# Patient Record
Sex: Female | Born: 1935 | Race: White | Hispanic: No | Smoking: Former smoker
Health system: Southern US, Community
[De-identification: ages and names within clinical notes are randomized; demographics above are authoritative.]

## PROBLEM LIST (undated history)

## (undated) DIAGNOSIS — I10 Essential (primary) hypertension: Secondary | ICD-10-CM

## (undated) DIAGNOSIS — E039 Hypothyroidism, unspecified: Secondary | ICD-10-CM

## (undated) DIAGNOSIS — J449 Chronic obstructive pulmonary disease, unspecified: Secondary | ICD-10-CM

## (undated) DIAGNOSIS — I482 Chronic atrial fibrillation, unspecified: Secondary | ICD-10-CM

## (undated) DIAGNOSIS — IMO0001 Reserved for inherently not codable concepts without codable children: Secondary | ICD-10-CM

## (undated) DIAGNOSIS — Z5189 Encounter for other specified aftercare: Secondary | ICD-10-CM

## (undated) DIAGNOSIS — H3530 Unspecified macular degeneration: Secondary | ICD-10-CM

## (undated) DIAGNOSIS — M069 Rheumatoid arthritis, unspecified: Secondary | ICD-10-CM

## (undated) DIAGNOSIS — G629 Polyneuropathy, unspecified: Secondary | ICD-10-CM

## (undated) DIAGNOSIS — J180 Bronchopneumonia, unspecified organism: Secondary | ICD-10-CM

## (undated) DIAGNOSIS — D649 Anemia, unspecified: Secondary | ICD-10-CM

## (undated) DIAGNOSIS — I739 Peripheral vascular disease, unspecified: Secondary | ICD-10-CM

## (undated) DIAGNOSIS — N898 Other specified noninflammatory disorders of vagina: Secondary | ICD-10-CM

## (undated) DIAGNOSIS — Z87828 Personal history of other (healed) physical injury and trauma: Secondary | ICD-10-CM

## (undated) DIAGNOSIS — J984 Other disorders of lung: Secondary | ICD-10-CM

## (undated) DIAGNOSIS — E7800 Pure hypercholesterolemia, unspecified: Secondary | ICD-10-CM

## (undated) DIAGNOSIS — J439 Emphysema, unspecified: Secondary | ICD-10-CM

## (undated) DIAGNOSIS — I509 Heart failure, unspecified: Secondary | ICD-10-CM

## (undated) HISTORY — PX: CERVICAL DISC SURGERY: SHX588

## (undated) HISTORY — PX: EYE SURGERY: SHX253

## (undated) HISTORY — DX: Hypothyroidism, unspecified: E03.9

## (undated) HISTORY — PX: CARDIOVERSION: SHX1299

## (undated) HISTORY — DX: Polyneuropathy, unspecified: G62.9

## (undated) HISTORY — PX: JOINT REPLACEMENT: SHX530

## (undated) HISTORY — PX: APPENDECTOMY: SHX54

## (undated) HISTORY — DX: Other disorders of lung: J98.4

## (undated) HISTORY — DX: Chronic atrial fibrillation, unspecified: I48.20

## (undated) HISTORY — PX: DILATION AND CURETTAGE OF UTERUS: SHX78

## (undated) HISTORY — PX: BACK SURGERY: SHX140

## (undated) HISTORY — PX: TOTAL HIP ARTHROPLASTY: SHX124

## (undated) HISTORY — DX: Pure hypercholesterolemia, unspecified: E78.00

## (undated) HISTORY — DX: Anemia, unspecified: D64.9

## (undated) HISTORY — PX: CATARACT EXTRACTION W/ INTRAOCULAR LENS  IMPLANT, BILATERAL: SHX1307

## (undated) HISTORY — DX: Essential (primary) hypertension: I10

---
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
## 1973-08-14 HISTORY — PX: TOTAL ABDOMINAL HYSTERECTOMY: SHX209

## 1998-04-27 ENCOUNTER — Ambulatory Visit (HOSPITAL_COMMUNITY): Admission: RE | Admit: 1998-04-27 | Discharge: 1998-04-27 | Payer: Self-pay | Admitting: Internal Medicine

## 2000-12-05 ENCOUNTER — Encounter: Admission: RE | Admit: 2000-12-05 | Discharge: 2001-03-05 | Payer: Self-pay | Admitting: Internal Medicine

## 2002-12-17 ENCOUNTER — Encounter: Payer: Self-pay | Admitting: Orthopedic Surgery

## 2002-12-17 ENCOUNTER — Encounter: Admission: RE | Admit: 2002-12-17 | Discharge: 2002-12-17 | Payer: Self-pay | Admitting: Orthopedic Surgery

## 2002-12-18 ENCOUNTER — Ambulatory Visit (HOSPITAL_BASED_OUTPATIENT_CLINIC_OR_DEPARTMENT_OTHER): Admission: RE | Admit: 2002-12-18 | Discharge: 2002-12-18 | Payer: Self-pay | Admitting: Orthopedic Surgery

## 2004-07-01 ENCOUNTER — Ambulatory Visit (HOSPITAL_COMMUNITY): Admission: RE | Admit: 2004-07-01 | Discharge: 2004-07-01 | Payer: Self-pay | Admitting: Cardiology

## 2004-07-19 ENCOUNTER — Ambulatory Visit (HOSPITAL_COMMUNITY): Admission: RE | Admit: 2004-07-19 | Discharge: 2004-07-19 | Payer: Self-pay | Admitting: Cardiology

## 2004-08-01 ENCOUNTER — Ambulatory Visit: Payer: Self-pay | Admitting: Critical Care Medicine

## 2004-09-14 ENCOUNTER — Ambulatory Visit: Payer: Self-pay | Admitting: Critical Care Medicine

## 2005-02-02 ENCOUNTER — Ambulatory Visit: Payer: Self-pay | Admitting: Critical Care Medicine

## 2005-05-15 ENCOUNTER — Ambulatory Visit: Payer: Self-pay | Admitting: Critical Care Medicine

## 2005-09-04 ENCOUNTER — Ambulatory Visit: Payer: Self-pay | Admitting: Critical Care Medicine

## 2005-11-01 ENCOUNTER — Ambulatory Visit: Payer: Self-pay | Admitting: Critical Care Medicine

## 2006-02-27 ENCOUNTER — Encounter: Admission: RE | Admit: 2006-02-27 | Discharge: 2006-03-25 | Payer: Self-pay | Admitting: Internal Medicine

## 2006-03-06 ENCOUNTER — Ambulatory Visit: Payer: Self-pay | Admitting: Critical Care Medicine

## 2006-03-26 ENCOUNTER — Encounter: Admission: RE | Admit: 2006-03-26 | Discharge: 2006-04-26 | Payer: Self-pay | Admitting: Internal Medicine

## 2006-07-13 ENCOUNTER — Ambulatory Visit: Payer: Self-pay | Admitting: Critical Care Medicine

## 2006-10-24 ENCOUNTER — Encounter: Admission: RE | Admit: 2006-10-24 | Discharge: 2006-10-24 | Payer: Self-pay | Admitting: Orthopedic Surgery

## 2006-11-19 ENCOUNTER — Encounter: Admission: RE | Admit: 2006-11-19 | Discharge: 2006-11-19 | Payer: Self-pay | Admitting: Orthopedic Surgery

## 2006-11-28 ENCOUNTER — Encounter: Admission: RE | Admit: 2006-11-28 | Discharge: 2007-01-10 | Payer: Self-pay | Admitting: Orthopedic Surgery

## 2006-12-26 ENCOUNTER — Ambulatory Visit: Payer: Self-pay | Admitting: Critical Care Medicine

## 2007-06-11 DIAGNOSIS — J4489 Other specified chronic obstructive pulmonary disease: Secondary | ICD-10-CM | POA: Insufficient documentation

## 2007-06-11 DIAGNOSIS — J449 Chronic obstructive pulmonary disease, unspecified: Secondary | ICD-10-CM

## 2007-06-11 DIAGNOSIS — E785 Hyperlipidemia, unspecified: Secondary | ICD-10-CM

## 2007-06-11 DIAGNOSIS — E119 Type 2 diabetes mellitus without complications: Secondary | ICD-10-CM | POA: Insufficient documentation

## 2007-06-11 DIAGNOSIS — I10 Essential (primary) hypertension: Secondary | ICD-10-CM | POA: Insufficient documentation

## 2007-06-11 DIAGNOSIS — I4891 Unspecified atrial fibrillation: Secondary | ICD-10-CM

## 2007-06-11 DIAGNOSIS — E039 Hypothyroidism, unspecified: Secondary | ICD-10-CM

## 2007-07-21 ENCOUNTER — Inpatient Hospital Stay (HOSPITAL_COMMUNITY): Admission: EM | Admit: 2007-07-21 | Discharge: 2007-07-29 | Payer: Self-pay | Admitting: Emergency Medicine

## 2007-07-21 ENCOUNTER — Ambulatory Visit: Payer: Self-pay | Admitting: Critical Care Medicine

## 2007-12-01 ENCOUNTER — Encounter: Admission: RE | Admit: 2007-12-01 | Discharge: 2007-12-01 | Payer: Self-pay | Admitting: Internal Medicine

## 2009-08-14 HISTORY — PX: FRACTURE SURGERY: SHX138

## 2010-03-22 ENCOUNTER — Ambulatory Visit: Payer: Self-pay | Admitting: Cardiology

## 2010-03-22 ENCOUNTER — Ambulatory Visit (HOSPITAL_COMMUNITY): Admission: RE | Admit: 2010-03-22 | Discharge: 2010-03-22 | Payer: Self-pay | Admitting: Cardiology

## 2010-06-10 ENCOUNTER — Inpatient Hospital Stay (HOSPITAL_COMMUNITY): Admission: EM | Admit: 2010-06-10 | Discharge: 2010-06-15 | Payer: Self-pay | Admitting: Emergency Medicine

## 2010-07-25 ENCOUNTER — Ambulatory Visit: Payer: Self-pay | Admitting: Cardiology

## 2010-10-25 LAB — BASIC METABOLIC PANEL
BUN: 18 mg/dL (ref 6–23)
BUN: 22 mg/dL (ref 6–23)
CO2: 26 mEq/L (ref 19–32)
CO2: 26 mEq/L (ref 19–32)
Calcium: 8 mg/dL — ABNORMAL LOW (ref 8.4–10.5)
Calcium: 8.3 mg/dL — ABNORMAL LOW (ref 8.4–10.5)
Chloride: 106 mEq/L (ref 96–112)
Chloride: 107 mEq/L (ref 96–112)
Creatinine, Ser: 0.93 mg/dL (ref 0.4–1.2)
Creatinine, Ser: 0.97 mg/dL (ref 0.4–1.2)
GFR calc Af Amer: >60 mL/min (ref >60)
GFR calc Af Amer: >60 mL/min (ref >60)
GFR calc non Af Amer: 54 mL/min — ABNORMAL LOW (ref >60)
GFR calc non Af Amer: 57 mL/min — ABNORMAL LOW (ref >60)
Glucose, Bld: 118 mg/dL — ABNORMAL HIGH (ref 70–99)
Glucose, Bld: 137 mg/dL — ABNORMAL HIGH (ref 70–99)
Potassium: 4.5 mEq/L (ref 3.5–5.1)
Potassium: 4.5 mEq/L (ref 3.5–5.1)
Sodium: 137 mEq/L (ref 135–145)
Sodium: 138 mEq/L (ref 135–145)

## 2010-10-25 LAB — PROTIME-INR
INR: 1.86 — ABNORMAL HIGH (ref 0.00–1.49)
INR: 2.09 — ABNORMAL HIGH (ref 0.00–1.49)
Prothrombin Time: 21.6 seconds — ABNORMAL HIGH (ref 11.6–15.2)
Prothrombin Time: 23.6 seconds — ABNORMAL HIGH (ref 11.6–15.2)

## 2010-10-25 LAB — CBC
HCT: 23.2 % — ABNORMAL LOW (ref 36.0–46.0)
HCT: 24.5 % — ABNORMAL LOW (ref 36.0–46.0)
Hemoglobin: 7.9 g/dL — ABNORMAL LOW (ref 12.0–15.0)
Hemoglobin: 8.3 g/dL — ABNORMAL LOW (ref 12.0–15.0)
MCH: 31.3 pg (ref 26.0–34.0)
MCH: 31.5 pg (ref 26.0–34.0)
MCHC: 33.8 g/dL (ref 30.0–36.0)
MCHC: 33.9 g/dL (ref 30.0–36.0)
MCV: 92.7 fL (ref 78.0–100.0)
MCV: 92.8 fL (ref 78.0–100.0)
Platelets: 145 10*3/uL — ABNORMAL LOW (ref 150–400)
Platelets: 196 10*3/uL (ref 150–400)
RBC: 2.51 MIL/uL — ABNORMAL LOW (ref 3.87–5.11)
RBC: 2.64 MIL/uL — ABNORMAL LOW (ref 3.87–5.11)
RDW: 14.3 % (ref 11.5–15.5)
RDW: 14.8 % (ref 11.5–15.5)
WBC: 4.9 10*3/uL (ref 4.0–10.5)
WBC: 5.1 10*3/uL (ref 4.0–10.5)

## 2010-10-25 LAB — GLUCOSE, CAPILLARY
Glucose-Capillary: 145 mg/dL — ABNORMAL HIGH (ref 70–99)
Glucose-Capillary: 148 mg/dL — ABNORMAL HIGH (ref 70–99)
Glucose-Capillary: 160 mg/dL — ABNORMAL HIGH (ref 70–99)
Glucose-Capillary: 173 mg/dL — ABNORMAL HIGH (ref 70–99)
Glucose-Capillary: 174 mg/dL — ABNORMAL HIGH (ref 70–99)
Glucose-Capillary: 250 mg/dL — ABNORMAL HIGH (ref 70–99)

## 2010-10-26 LAB — POCT I-STAT, CHEM 8
BUN: 26 mg/dL — ABNORMAL HIGH (ref 6–23)
Chloride: 100 mEq/L (ref 96–112)
Creatinine, Ser: 1.3 mg/dL — ABNORMAL HIGH (ref 0.4–1.2)
Glucose, Bld: 135 mg/dL — ABNORMAL HIGH (ref 70–99)
Potassium: 4.2 mEq/L (ref 3.5–5.1)
Sodium: 133 mEq/L — ABNORMAL LOW (ref 135–145)

## 2010-10-26 LAB — CBC
HCT: 36.9 % (ref 36.0–46.0)
Hemoglobin: 10.8 g/dL — ABNORMAL LOW (ref 12.0–15.0)
Hemoglobin: 12.4 g/dL (ref 12.0–15.0)
Hemoglobin: 9 g/dL — ABNORMAL LOW (ref 12.0–15.0)
MCH: 31.1 pg (ref 26.0–34.0)
MCH: 31.1 pg (ref 26.0–34.0)
MCH: 31.2 pg (ref 26.0–34.0)
MCH: 31.5 pg (ref 26.0–34.0)
MCHC: 33.7 g/dL (ref 30.0–36.0)
MCHC: 33.9 g/dL (ref 30.0–36.0)
MCV: 91.7 fL (ref 78.0–100.0)
MCV: 92.2 fL (ref 78.0–100.0)
MCV: 92.3 fL (ref 78.0–100.0)
Platelets: 114 10*3/uL — ABNORMAL LOW (ref 150–400)
Platelets: 120 10*3/uL — ABNORMAL LOW (ref 150–400)
Platelets: 140 10*3/uL — ABNORMAL LOW (ref 150–400)
Platelets: 143 10*3/uL — ABNORMAL LOW (ref 150–400)
RBC: 2.84 MIL/uL — ABNORMAL LOW (ref 3.87–5.11)
RBC: 3.47 MIL/uL — ABNORMAL LOW (ref 3.87–5.11)
RBC: 4 MIL/uL (ref 3.87–5.11)
RDW: 14.4 % (ref 11.5–15.5)
RDW: 15.1 % (ref 11.5–15.5)
WBC: 5.8 10*3/uL (ref 4.0–10.5)
WBC: 6.9 10*3/uL (ref 4.0–10.5)
WBC: 8.5 10*3/uL (ref 4.0–10.5)

## 2010-10-26 LAB — GLUCOSE, CAPILLARY
Glucose-Capillary: 109 mg/dL — ABNORMAL HIGH (ref 70–99)
Glucose-Capillary: 127 mg/dL — ABNORMAL HIGH (ref 70–99)
Glucose-Capillary: 141 mg/dL — ABNORMAL HIGH (ref 70–99)
Glucose-Capillary: 151 mg/dL — ABNORMAL HIGH (ref 70–99)
Glucose-Capillary: 210 mg/dL — ABNORMAL HIGH (ref 70–99)

## 2010-10-26 LAB — COMPREHENSIVE METABOLIC PANEL
BUN: 28 mg/dL — ABNORMAL HIGH (ref 6–23)
CO2: 29 mEq/L (ref 19–32)
Calcium: 8.3 mg/dL — ABNORMAL LOW (ref 8.4–10.5)
Chloride: 98 mEq/L (ref 96–112)
Creatinine, Ser: 1.34 mg/dL — ABNORMAL HIGH (ref 0.4–1.2)
GFR calc Af Amer: 45 mL/min — ABNORMAL LOW (ref >60)
GFR calc non Af Amer: 37 mL/min — ABNORMAL LOW (ref >60)
Total Bilirubin: 0.9 mg/dL (ref 0.3–1.2)

## 2010-10-26 LAB — BASIC METABOLIC PANEL
BUN: 26 mg/dL — ABNORMAL HIGH (ref 6–23)
CO2: 24 mEq/L (ref 19–32)
Calcium: 7.7 mg/dL — ABNORMAL LOW (ref 8.4–10.5)
Chloride: 109 mEq/L (ref 96–112)
Creatinine, Ser: 1.11 mg/dL (ref 0.4–1.2)
GFR calc Af Amer: 33 mL/min — ABNORMAL LOW (ref >60)
GFR calc non Af Amer: 27 mL/min — ABNORMAL LOW (ref >60)
Glucose, Bld: 188 mg/dL — ABNORMAL HIGH (ref 70–99)
Sodium: 135 mEq/L (ref 135–145)

## 2010-10-26 LAB — DIFFERENTIAL
Eosinophils Relative: 1 % (ref 0–5)
Lymphocytes Relative: 14 % (ref 12–46)
Lymphs Abs: 1 10*3/uL (ref 0.7–4.0)
Neutro Abs: 5.5 10*3/uL (ref 1.7–7.7)

## 2010-10-26 LAB — PROTIME-INR
INR: 1.35 (ref 0.00–1.49)
INR: 1.47 (ref 0.00–1.49)
INR: 1.52 — ABNORMAL HIGH (ref 0.00–1.49)
INR: 2.57 — ABNORMAL HIGH (ref 0.00–1.49)
Prothrombin Time: 16.9 seconds — ABNORMAL HIGH (ref 11.6–15.2)
Prothrombin Time: 18 seconds — ABNORMAL HIGH (ref 11.6–15.2)
Prothrombin Time: 27.7 seconds — ABNORMAL HIGH (ref 11.6–15.2)

## 2010-10-26 LAB — PREPARE FRESH FROZEN PLASMA: Unit division: 0

## 2010-10-26 LAB — URINALYSIS, ROUTINE W REFLEX MICROSCOPIC
Glucose, UA: NEGATIVE mg/dL
Ketones, ur: NEGATIVE mg/dL
Nitrite: NEGATIVE
Protein, ur: NEGATIVE mg/dL
Urobilinogen, UA: 0.2 mg/dL (ref 0.0–1.0)

## 2010-10-26 LAB — APTT: aPTT: 44 seconds — ABNORMAL HIGH (ref 24–37)

## 2010-10-26 LAB — TYPE AND SCREEN: ABO/RH(D): A POS

## 2010-10-26 LAB — ABO/RH: ABO/RH(D): A POS

## 2010-10-26 LAB — URINE CULTURE

## 2010-12-27 NOTE — H&P (Signed)
Sydney [name], RAMAKER                 ACCOUNT NO.:  [id number]   MEDICAL RECORD NO.:  [id number]          PATIENT TYPE:  EMS   LOCATION:  MAJO                         FACILITY:  [facility]   PHYSICIAN:  Mark A. Perini, M.D.   DATE OF BIRTH:  [birth year]   DATE OF ADMISSION:  07/21/2007  DATE OF DISCHARGE:                              HISTORY & PHYSICAL   PRIMARY CARE PHYSICIAN:  Dr. Geoffry Paradise.   PHYSICIANS:  Dr. Delfin Edis, cardiologist, and Dr. Danise Mina,  pulmonologist.   HISTORY OF PRESENT ILLNESS:  Sydney [name] is a very pleasant 77 year old female  with past history as listed below.  She has not fell well for several  days.  She felt as though she may have a urinary tract infection.  She  has felt tired and washed out.  Today she had to push herself to get  ready as she was preparing to go to a concert at 3 p.m.  Then sitting in  a chair she suddenly became very nauseated and vomited, and then had  another episode of vomiting while lying down.  She had no real cough  until after the emesis episodes.  After that time she had some rattling  in her chest.  There has been no definite fever.  No worsening cough  over the last few days, no blood from above or below.  She denies any  chest pain.  She has chronic shortness of breath which is not really  that different, although she was somewhat more short of breath around  the time of her nausea and vomiting episodes.  She has had some diarrhea  in the last 24-36 hours as well.   PAST MEDICAL HISTORY:  1. COPD not oxygen requiring.  2. Primary emphysema.  3. Type 2 diabetes.  4. Permanent atrial fibrillation.  5. Negative Cardiolite study in November 2005.  6. Cardioversion procedure November 2005, but she is apparently in      chronic atrial fibrillation at this point.  7. History of cholecystectomy, hysterectomy, oophorectomy,      appendectomy, and back surgeries.  8. She states her last hemoglobin A1c was in the 7 range.  9. She  denies any heart attack or stroke history.  She says that her      aorta may be somewhat enlarged.   ALLERGIES:  No known allergies.   MEDICATIONS:  1. Spiriva daily.  2. Amaryl unknown dose.  3. Calcium unknown dose.  4. Coumadin unknown dose.  5. Gabapentin 300 mg twice a day.  6. Lipitor which she has been off of recently.  7. Lisinopril 20 mg daily.  8. Multivitamin daily.  9. Synthroid unknown dose daily.  10.Vitamin B12.  11.Vitamin C.   SOCIAL HISTORY:  No tobacco, no drugs, no alcohol.  She lives alone.  She has been a widow for 26 years.  She has two children, one  grandchild.   FAMILY HISTORY:  Noncontributory.   REVIEW OF SYSTEMS:  As per the History of Present Illness.  In the  emergency room she was given Phenergan, Zithromax, Rocephin, and  Zofran.   PHYSICAL EXAMINATION:  VITAL SIGNS:  Temperature 96.7, blood pressure  97/55, pulse 98, respiratory 18, 94% saturation.  GENERAL:  She is age appropriate, supine, no acute distress.  There is  no pallor, no icterus.  She is alert and oriented x4, responds  appropriately, and follows commands normally.  NECK:  There is no JVD.  LUNGS:  There are decreased breath sounds bilaterally with some scant  bibasilar crackles noted.  No wheezes or rhonchi.  There are very  distant S1 and S2 noted on heart exam.  No definite murmur is  auscultated.  ABDOMEN:  Soft, nontender, nondistended with normoactive bowel sounds.  There are no masses.  EXTREMITIES:  There is no edema.  She moves extremities x4.   LABORATORY DATA:  Myoglobin 114, MB fraction 2, troponin I less than  0.05.  INR 1.9.  Sodium 137, potassium 3.9, chloride 97, CO2 23, BUN 21,  creatinine 1.34, glucose 190.  Calcium 9.  Albumin 4.1.  ALT 28.  Total  protein 6.7.  AST 30.  Lipase 21.  GFR 38 mL per minute.  White count  5.3 with 87% segs, 8% lymphocytes, 4% monocytes, hemoglobin 14.2,  platelet count 120,000.  Urinalysis is normal except for large   leukocytes and too numerous to count white cells.  On microscopic there  are 0-2 red cells and a few bacteria.   Chest x-rays personally reviewed and shows bibasilar infiltrates.   ASSESSMENT/PLAN:  An 77 year old female with pneumonia.  This is  possibly an aspiration event or possibly just a community-acquired  pneumonia.  She does have past history of chronic obstructive pulmonary  disease.  She may also have an underlying urinary tract infection and  may have had a viral syndrome given the recent diarrhea.  We will admit  to a telemetry bed.  We will check an EKG.  Will give her Zosyn  intravenously.  Will continue Coumadin per pharmacy dosing.  Her family  is to retrieve the doses of her medicines from home as the medication  list has been misplaced.  We will check a TSH, and place on proton pump  inhibitor therapy.  Will add Xopenex nebulized and continue her Spiriva.  She is not overly bronchospastic and is breathing comfortably now and I  do not think she needs corticosteroids at this time, but she will need  to be monitored for this.  She is a full code status.      Mark A. Perini, M.D.  Electronically Signed     MAP/MEDQ  D:  07/21/2007  T:  07/22/2007  Job:  [id number]   cc:   Geoffry Paradise, M.D.

## 2010-12-27 NOTE — Consult Note (Signed)
NAMELEOMA, FOLDS NO.:  [id number]   MEDICAL RECORD NO.:  [id number]          PATIENT TYPE:  INP   LOCATION:  2031                         FACILITY:  [facility]   PHYSICIAN:  Charlcie Cradle. Delford Field, MD, FCCPDATE OF BIRTH:  19[birth year]-08-[birth year]   DATE OF CONSULTATION:  07/25/2007  DATE OF DISCHARGE:                                 CONSULTATION   PULMONARY CONSULTATION:   CHIEF COMPLAINT:  Aspiration.   HISTORY OF PRESENT ILLNESS:  This is a pleasant 77 year old female with  a previous history of primary COPD, emphysema on Spiriva without oxygen  requirement, type 2 diabetes, chronic atrial fibrillation on Coumadin,  previous cholecystectomy, hysterectomy, oophorectomy, appendectomy and  back surgeries.  No previous history of coronary artery disease or  stroke.  She had episode of acute emesis and unresponsiveness, and may  have had a urinary tract infection as well.  Became tired, washed.  Then, while she was trying to go to a social event on Sunday, July 21, 2007, she suddenly became very nauseated, had an episode of emesis  and passed out.  Some of the material went into the lungs, and she has  severe cough.  She was brought into the emergency room with rattling and  shortness of breath, but this seemed to clear.  Then she, on a delayed  basis, developed increased shortness of breath over the last 24 hours.  Chest x-ray shows bilateral patchy infiltrates compatible with  aspiration.  We are asked to assess.   PAST HISTORY:  Medical as noted.  Previous history of cardioversion  procedure in 2005, but now in persistent chronic atrial fibrillation.   MEDICATION ALLERGIES:  None.   MEDICATIONS:  1. Spiriva daily.  2. Amaryl.  3. Calcium.  4. Coumadin.  5. Gabapentin.  6. Lipitor.  7. Lipitor.  8. Lisinopril.  9. Multivitamins.  10.Synthroid.  11.Vitamin supplementation.   SOCIAL HISTORY:  Ex-smoker.  Lives alone.  Widowed for 26 years.   FAMILY HISTORY:   Noncontributory.   REVIEW OF SYSTEMS:  As above.   CURRENT MEDICATIONS:  1. Zithromax.  2. Rocephin.   PHYSICAL EXAMINATION:  VITAL SIGNS:  Temperature 99, blood pressure  117/66, pulse 90, saturation 94% on 4 liters, respirations 20.  CHEST:  Showed bilateral rales, a few expired wheezes.  CARDIAC:  Irregular rate and rhythm without S3.  Normal S1 and S2.  ABDOMEN:  Soft, nontender.  EXTREMITIES:  No edema, clubbing or venous disease.  SKIN:  Clear.  NEUROLOGIC:  Intact.   CHEST X-RAY:  The chest x-ray was reviewed and showed bilateral patchy  airspace disease, left greater than right.   LABORATORY DATA:  White count 5300, hemoglobin 14.2, sodium 135,  potassium 34, chloride 102, CO2 of 26, BUN 17, blood sugar 147.  Liver  functions unremarkable.  There is no micro data available.   IMPRESSION:  Probable aspiration pneumonia bilateral due to chemical  pneumonitis.  I am also concerned Spiriva may be aggravating urinary  tract infections with urinary retention.  Underlying chronic obstructive  pulmonary disease, not oxygen dependent.   RECOMMENDATIONS:  Continue oxygen therapy.  Add budesonide to  nebulizers.  Discontinue Spiriva.  Brief steroid pulse.  Discontinue ACE  inhibitor and begin Avapro.  Change to Zosyn.  Discontinue Rocephin,  Zithromax.  Add flutter valve.  Increase Mucinex.      Charlcie Cradle Delford Field, MD, [facility]  Electronically Signed     PEW/MEDQ  D:  07/25/2007  T:  07/25/2007  Job:  [id number]   cc:   Loraine Leriche A. Perini, M.D.  Geoffry Paradise, M.D.

## 2010-12-27 NOTE — Assessment & Plan Note (Signed)
[facility]                             PULMONARY OFFICE NOTE   NAME:LEWISYanitza, Shvartsman                        MRN:          [id number]  DATE:12/26/2006                            DOB:          12-25-1920    Ms. Timmons returns today in followup.  She is an 77 year old white female  with history of chronic obstructive lung disease, primary emphysema,  type 2 diabetes, chronic atrial fibrillation.  Still has occasional  wheezing and dyspnea.  The cough is dry.  She maintains Spiriva daily.  Other maintenance medicines are listed in the chart, and correct as  reviewed.   PHYSICAL EXAMINATION:  Temperature 97.  Blood pressure 108/80.  Pulse  86.  Saturation was 96% on room air.  CHEST:  Showed distant breath sounds with a few expiratory wheezing.  CARDIAC EXAM:  Showed a regular rate and rhythm without S3.  Normal S1  and S2.  ABDOMEN:  Soft and non-tender.  EXTREMITIES:  Showed no edema or clubbing.  SKIN:  Was clear.   Spirometry obtained today showed spirometry except for moderate  peripheral airflow obstruction with an FEV of 25/75 at 59% predicted.   IMPRESSION:  Chronic obstructive lung disease, but stable at this time.   PLAN:  Maintain Spiriva as is.  We will see the patient back in  followup.     Charlcie Cradle Delford Field, MD, [facility]  Electronically Signed    PEW/MedQ  DD: 12/26/2006  DT: 12/26/2006  Job #: [id number]   cc:   Geoffry Paradise, M.D.

## 2010-12-30 NOTE — Cardiovascular Report (Signed)
NAMEBRUNILDA, EBLE NO.:  [id number]   MEDICAL RECORD NO.:  [id number]          PATIENT TYPE:  OIB   LOCATION:  2899                         FACILITY:  [facility]   PHYSICIAN:  Colleen Can. Deborah Chalk, M.D.DATE OF BIRTH:  1920/09/13   DATE OF PROCEDURE:  07/01/2004  DATE OF DISCHARGE:                              CARDIAC CATHETERIZATION   PROCEDURE:  Cardioversion.   ANESTHESIA:  Dr. Kipp Brood, 125 mg Pentothal.   PROCEDURE:  Using anterior posterior paddles, 200 watt seconds of energy  were delivered in synchronized fashion with conversion to sinus rhythm.  She  had a prolonged first-degree AV block and occasional PACs, occasional PVCs,  and gradually stabilized into a sinus mechanism.  She may have some slowed  sinus node recovery times, but no long or dangerous pauses were noted.       SNT/MEDQ  D:  07/01/2004  T:  07/01/2004  Job:  [id number]

## 2010-12-30 NOTE — Assessment & Plan Note (Signed)
[facility]                             PULMONARY OFFICE NOTE   NAME:Sydney [name], Sydney [name]                        MRN:          [id number]  DATE:07/13/2006                            DOB:          03-10-1921    Sydney [name] is an 77 year old white female with history of primary  emphysema, chronic obstructive lung disease.  She is doing well on  Spiriva daily.  No change in her other medications have been made except  she is now on lisinopril 10/12.5 daily.   On exam, temperature 97, blood pressure 106/70, pulse 83, saturation 95%  room air.  Chest showed diminished breath sounds with prolonged  expiratory phase.  There was no evidence of wheeze or rhonchi noted.  Cardiac exam showed a regular rate and rhythm without S3.  Normal S1-S2.  Abdomen was soft, nontender.  Extremities showed no edema or clubbing.  Skin was clear.   IMPRESSION:  Chronic obstructive lung disease with primary emphysematous  component.   PLAN:  For the patient to maintain Spiriva as currently dosed and we  will see the patient back in return follow up in six months.     Charlcie Cradle Delford Field, MD, [facility]  Electronically Signed    PEW/MedQ  DD: 07/13/2006  DT: 07/14/2006  Job #: [id number]

## 2010-12-30 NOTE — Discharge Summary (Signed)
[name], [name]:  [id number]   MEDICAL RECORD NO.:  [id number]          PATIENT TYPE:  INP   LOCATION:  2031                         FACILITY:  [facility]   PHYSICIAN:  Geoffry Paradise, M.D.  DATE OF BIRTH:  [birth year]   DATE OF ADMISSION:  07/21/2007  DATE OF DISCHARGE:  07/29/2007                               DISCHARGE SUMMARY   DIAGNOSES AT THE TIME OF DISCHARGE.:  1. Aspiration pneumonia.  2. Diabetes mellitus, type 2.  3. Chronic obstructive pulmonary disease.  4. Atrial fibrillation on chronic Coumadin.   HISTORY OF PRESENT ILLNESS:  Sydney [name] is a pleasant 77 year old  with chronic atrial fibrillation, COPD, type 2 diabetes mellitus,  presenting at this time with, nausea, vomiting and increasing cough.  She had not been well for several days and had a recent urinary tract  infection.  On the day of presentation, she became nauseated and vomited  and had at least a second episode of that as well.  She does have  chronic dyspnea and has had no fever but does present with progressive  cough, shortness of breath and chest x-ray compatible with bibasilar  infiltrates and questionable aspiration.  For details, see the dictated  summary by Dr. Waynard Edwards on July 21, 2007.   DATA:  Chest x-ray: Bilateral airspace disease, suspect for pneumonia.   EKG:  Atrial fibrillation, rate 96.   CBC:  Hemoglobin 14.2, hematocrit 43, white blood count 5.3, platelet  count 120,000.  PT 21.9, INR 1.9.  Chemistries:  Sodium 137, potassium  3.9, chloride 97, CO2 23, BUN 21, creatinine 1.34, glucose 190 calcium  9.0. Liver function testing:  Total protein 6.7, albumin 4.1, AST 30,  ALT 20, alkaline phosphatase 63, bilirubin 1.1, magnesium 1.5,  phosphorous 3.8, lipase 21.  Brain type natriuretic peptide is mildly at  elevated 494.  TSH 1.129   HOSPITAL COURSE:  The patient was admitted, placed on Zosyn, as well as  bronchodilators to include Xopenex.  She was  treated with Zofran for  nausea and vomiting, but this promptly resolved, and she was provided  with IV fluids as well.  Coumadin was continued by pharmacy protocol.  Insulin was administered in the form of sliding scale, and ultimately  some Lantus as secondary to failure to improve, a burst of steroids were  added.  Pulmonary consultation was obtained in addition to the steroids.  She was converted from lisinopril to Avapro at the outside, and this was  worsening her cough.  She continued on Zosyn despite a brief conversion  to Avelox, Rocephin, Zithromax.  Ultimately, slowly improved. Steroids  were discontinued, and she was stable for discharge.  The patient was  discharged in much improved stable condition.  Lung fields were clear.  O2 saturation stable, p.o. intake excellent.   DISCHARGE MEDICATIONS:  1. Levothyroxine 50 mcg daily.  2. Glimepiride 4 mg 1/2 daily.  3. Glimepiride 4 mg once daily.  4. Lisinopril/hydrochlorothiazide 20/25 one daily.  5. Lipitor 20 mg daily.  6. Gabapentin 300 b.i.d.  7. Multivitamins.  8. Coumadin 2.5 mg one  daily,'  9. Symbicort 160/4.5 two inhalations b.i.d.  10.Mucinex b.i.d.  11.Ceftin 500 b.i.d. for five additional days.  12.Lantus insulin 15 units nightly.  13.Spiriva had been discontinued.   She is to check blood sugars b.i.d. in hopes of weaning her back to oral  regimen alone.  She is to follow up with medication management in Dr.  Lanell Matar office for Coumadin checking and pulmonary followup as well.           ______________________________  Geoffry Paradise, M.D.     RA/MEDQ  D:  10/11/2007  T:  10/11/2007  Job:  [id number]

## 2010-12-30 NOTE — H&P (Signed)
Sydney [name], Sydney [name]                 ACCOUNT NO.:  [id number]   MEDICAL RECORD NO.:  [id number]          PATIENT TYPE:  OIB   LOCATION:                               FACILITY:  [facility]   PHYSICIAN:  Colleen Can. Deborah Chalk, M.D.DATE OF BIRTH:  1920-08-22   DATE OF ADMISSION:  07/01/2004  DATE OF DISCHARGE:                                HISTORY & PHYSICAL   CHIEF COMPLAINT:  Fatigue, shortness of breath, and weakness.   HISTORY OF PRESENT ILLNESS:  Ms. Ratliff is an 77 year old widowed white  female who presented to our office for evaluation of shortness of breath.  She underwent adenosine Cardiolite study, which was basically unremarkable  at that time; however, as the Cardiolite was performed she was noted to be  in atrial fibrillation.  The exact duration was unknown but is felt to date  back to June of this year.  She has subsequently been placed on Coumadin  anticoagulation.  Her INRs have now been therapeutic for approximately one  month, and she presents for elective cardioversion.  Clinically she has been  more short of breath as well as a generalized feeling of weakness and  fatigue.  She has had no frank syncope and no chest pain.   PAST MEDICAL HISTORY:  1.  Diabetes, noninsulin-dependent.  2.  Hypertension x20 years plus.  3.  Hyperlipidemia.  4.  Hypothyroidism on replacement.  5.  Remote history of pneumonia.  6.  Remote history of tobacco use.  7.  Obesity.  8.  Neuropathy.  9.  Previous neck surgery.  10. Status post total abdominal hysterectomy.  11. Status post cholecystectomy.  12. Status post appendectomy.   ALLERGIES:  None.   CURRENT MEDICATIONS:  1.  Coumadin 5 x1, 2.5 x6.  2.  Lipitor 40 mg daily.  3.  Synthroid 50 mcg daily.  4.  Lisinopril/HCT 10/12.5 mg b.i.d.  5.  Amaryl 2 mg a day.  6.  Neurontin 300 mg b.i.d.  7.  Multivitamin daily.   FAMILY HISTORY:  Father died of a stroke and had a history of heart  problems.  Mother died at age 16 and had a  stroke, heart problems, as well  as a pacemaker in place.   SOCIAL HISTORY:  She has been a widow for 23 years.  She has two children.  She is a remote smoker.  She has rare use of alcohol.   REVIEW OF SYSTEMS:  As noted above and is otherwise unremarkable.   PHYSICAL EXAMINATION:  GENERAL:  She is a pleasant, elderly white female in  no acute distress.  VITAL SIGNS:  Her weight is 172 pounds, blood pressure 140/80 sitting and  standing, heart rate is 88 and irregular.  CHEST:  Lungs are clear.  CARDIAC:  Heart shows an irregular irregular rhythm.  ABDOMEN:  Soft, positive bowel sounds, and nontender, yet is obese.  EXTREMITIES:  Without edema.  NEUROLOGIC:  Intact.  There are no gross focal deficits.   Pertinent labs are pending.   OVERALL IMPRESSION:  1.  Atrial fibrillation.  2.  Hypertension.  3.  Diabetes.  4.  Hyperlipidemia.  5.  Hypothyroidism.  6.  Previous negative adenosine Cardiolite study.   PLAN:  Will proceed on with attempts at elective cardioversion.  The  procedure has been reviewed in full detail with both her and her daughter,  and she is willing to proceed on Friday, July 01, 2004.       ________________________________________  Sharlee Blew, N.P.  ___________________________________________  Colleen Can. Deborah Chalk, M.D.    LC/MEDQ  D:  06/22/2004  T:  06/22/2004  Job:  [id number]   cc:   Geoffry Paradise, M.D.  [address]  [place]  [place] [address]  Fax: [phone]

## 2010-12-30 NOTE — Assessment & Plan Note (Signed)
[facility]                               PULMONARY OFFICE NOTE   NAME:LEWISKaye, Luoma                        MRN:          [id number]  DATE:03/06/2006                            DOB:          December 05, 1920    Ms. Styles is an 77 year old white female with a history of chronic  obstructive lung disease, primary emphysema.  Patient has been doing well on  the Spiriva product at one-two sprays daily, one capsule.  She appears to  tolerate this better than Advair.  She has not had any bladder retention  since she has been back on the Spiriva.   EXAMINATION:  VITAL SIGNS:  Temperature 98.  Blood pressure 124/70.  Pulse  77.  Saturation 97% on room air.  CHEST: Diminished breath sounds with prolonged inspiratory phase, no wheeze  or rhonchi noted.  CARDIAC:  Regular rate and rhythm that is 3, normal S1, S2.  ABDOMEN:  Soft, non-tender.  EXTREMITIES:  No edema or clubbing.  SKIN:  Clear.  NEUROLOGIC:  Intact.  HEENT:  No jugular venous distention.  Oropharynx clear.  NECK:  Supple.   IMPRESSION:  Chronic obstructive lung disease with a primary emphysematous  component - stable on Spiriva.   PLAN:  To maintain Spiriva as is.  We will see the patient back in return  followup.                                   Charlcie Cradle Delford Field, MD, FCCP   PEW/MedQ  DD:  03/06/2006  DT:  03/06/2006  Job #:  [id number]   cc:   Geoffry Paradise, MD

## 2011-01-04 ENCOUNTER — Ambulatory Visit: Payer: Self-pay | Admitting: Cardiology

## 2011-01-18 ENCOUNTER — Encounter: Payer: Self-pay | Admitting: Cardiology

## 2011-01-20 ENCOUNTER — Encounter: Payer: Self-pay | Admitting: Cardiology

## 2011-01-20 ENCOUNTER — Ambulatory Visit (INDEPENDENT_AMBULATORY_CARE_PROVIDER_SITE_OTHER): Payer: Medicare Other | Admitting: Cardiology

## 2011-01-20 DIAGNOSIS — I4891 Unspecified atrial fibrillation: Secondary | ICD-10-CM

## 2011-01-20 DIAGNOSIS — I10 Essential (primary) hypertension: Secondary | ICD-10-CM

## 2011-01-22 ENCOUNTER — Encounter: Payer: Self-pay | Admitting: Cardiology

## 2011-01-22 NOTE — Progress Notes (Signed)
Subjective:   Sydney [name] comes in today for followup visit. Overall, she's doing reasonably well she has chronic atrial fibrillation is managed with Coumadin anticoagulation. She's not had any lightheadedness or dizziness. She sees Dr. Jacky Kindle for followup INR.  She has occasional shortness of breath and occasional palpitations but is doing reasonably well. She does have underlying bronchospastic lung disease and a history of chronic dyspnea. She has chronic hypertension, hypercholesterolemia, a history of hypothyroidism.  She fractured her left hip in October of 2011 and has had a total hip replacement and is gradually improving.  Current Outpatient Prescriptions  Medication Sig Dispense Refill  . Ascorbic Acid (VITAMIN C PO) Take by mouth daily.        . Cyanocobalamin (VITAMIN B12 PO) Take by mouth daily.        . ferrous fumarate-iron polysaccharide complex (TANDEM) 162-115.2 MG CAPS Take 1 capsule by mouth daily with breakfast.        . fish oil-omega-3 fatty acids 1000 MG capsule Take by mouth daily.        . furosemide (LASIX) 20 MG tablet Take 20 mg by mouth every other day.        . gabapentin (NEURONTIN) 300 MG capsule Take 300 mg by mouth daily.       Marland Kitchen levothyroxine (SYNTHROID, LEVOTHROID) 50 MCG tablet Take 50 mcg by mouth daily.        Marland Kitchen lisinopril-hydrochlorothiazide (PRINZIDE,ZESTORETIC) 20-25 MG per tablet Take 1 tablet by mouth daily.        . metFORMIN (GLUCOPHAGE) 500 MG tablet Take 500 mg by mouth 2 (two) times daily with a meal.        . multivitamin-iron-minerals-folic acid (CENTRUM) chewable tablet Chew 1 tablet by mouth daily.        Marland Kitchen tiotropium (SPIRIVA) 18 MCG inhalation capsule Place 18 mcg into inhaler and inhale daily.        Marland Kitchen warfarin (COUMADIN) 2.5 MG tablet Take 2.5 mg by mouth. As directed by the [facility]        Allergies  Allergen Reactions  . Tramadol   . Vicodin (Hydrocodone-Acetaminophen)     Patient Active Problem List  Diagnoses  .  HYPOTHYROIDISM  . DIABETES MELLITUS, TYPE II  . HYPERLIPIDEMIA  . HYPERTENSION  . ATRIAL FIBRILLATION  . COPD    History  Smoking status  . Never Smoker   Smokeless tobacco  . Never Used    History  Alcohol Use No    Family History  Problem Relation Age of Onset  . Heart disease Mother   . Stroke Mother   . Heart disease Father   . Stroke Father   . Heart attack Father   . Muscular dystrophy Brother     Review of Systems:   The patient denies any heat or cold intolerance.  No weight gain or weight loss.  The patient denies headaches or blurry vision.  There is no cough or sputum production.  The patient denies dizziness.  There is no hematuria or hematochezia.  The patient denies any muscle aches or arthritis.  The patient denies any rash.  The patient denies frequent falling or instability.  There is no history of depression or anxiety.  All other systems were reviewed and are negative.   Physical Exam:   Weight is 143. Blood pressure is 138 or 72. Heart rate 80.The head is normocephalic and atraumatic.  Pupils are equally round and reactive to light.  Sclerae nonicteric.  Conjunctiva is  clear.  Oropharynx is unremarkable.  There's adequate oral airway.  Neck is supple there are no masses.  Thyroid is not enlarged.  There is no lymphadenopathy.  Lungs are clear.  Chest is symmetric.  Heart shows a regular rate and rhythm.  S1 and S2 are normal.  There is no murmur click or gallop.  Abdomen is soft normal bowel sounds.  There is no organomegaly.  Genital and rectal deferred.  Extremities are without edema.  Peripheral pulses are adequate.  Neurologically intact.  Full range of motion.  The patient is not depressed.  Skin is warm and dry.  Assessment / Plan:

## 2011-01-22 NOTE — Assessment & Plan Note (Signed)
Blood pressure readings are satisfactory 

## 2011-01-22 NOTE — Assessment & Plan Note (Signed)
She is managed with rate control and warfarin anticoagulation. Overall, she's doing reasonably well.

## 2011-04-25 ENCOUNTER — Emergency Department (HOSPITAL_COMMUNITY)
Admission: EM | Admit: 2011-04-25 | Discharge: 2011-04-25 | Disposition: A | Discharge status: Home / Self Care | Payer: Medicare Other | Attending: Emergency Medicine | Admitting: Emergency Medicine

## 2011-04-25 DIAGNOSIS — J449 Chronic obstructive pulmonary disease, unspecified: Secondary | ICD-10-CM | POA: Insufficient documentation

## 2011-04-25 DIAGNOSIS — S60949A Unspecified superficial injury of unspecified finger, initial encounter: Secondary | ICD-10-CM | POA: Insufficient documentation

## 2011-04-25 DIAGNOSIS — E039 Hypothyroidism, unspecified: Secondary | ICD-10-CM | POA: Insufficient documentation

## 2011-04-25 DIAGNOSIS — M7989 Other specified soft tissue disorders: Secondary | ICD-10-CM | POA: Insufficient documentation

## 2011-04-25 DIAGNOSIS — J4489 Other specified chronic obstructive pulmonary disease: Secondary | ICD-10-CM | POA: Insufficient documentation

## 2011-04-25 DIAGNOSIS — I509 Heart failure, unspecified: Secondary | ICD-10-CM | POA: Insufficient documentation

## 2011-04-25 DIAGNOSIS — M79609 Pain in unspecified limb: Secondary | ICD-10-CM | POA: Insufficient documentation

## 2011-04-25 DIAGNOSIS — M1990 Unspecified osteoarthritis, unspecified site: Secondary | ICD-10-CM | POA: Insufficient documentation

## 2011-04-25 DIAGNOSIS — W4909XA Other specified item causing external constriction, initial encounter: Secondary | ICD-10-CM | POA: Insufficient documentation

## 2011-04-25 DIAGNOSIS — E119 Type 2 diabetes mellitus without complications: Secondary | ICD-10-CM | POA: Insufficient documentation

## 2011-05-02 ENCOUNTER — Other Ambulatory Visit: Payer: Self-pay | Admitting: Internal Medicine

## 2011-05-02 ENCOUNTER — Encounter (HOSPITAL_COMMUNITY): Payer: Medicare Other | Attending: Internal Medicine

## 2011-05-02 DIAGNOSIS — D649 Anemia, unspecified: Secondary | ICD-10-CM | POA: Insufficient documentation

## 2011-05-03 ENCOUNTER — Encounter (HOSPITAL_COMMUNITY)
Admission: RE | Admit: 2011-05-03 | Discharge: 2011-05-03 | Payer: Medicare Other | Source: Ambulatory Visit | Attending: Internal Medicine | Admitting: Internal Medicine

## 2011-05-03 ENCOUNTER — Other Ambulatory Visit: Payer: Self-pay | Admitting: Cardiology

## 2011-05-04 LAB — CROSSMATCH
ABO/RH(D): A POS
Antibody Screen: NEGATIVE
Unit division: 0

## 2011-05-04 NOTE — Telephone Encounter (Signed)
Refilled Meds from fax  

## 2011-05-19 LAB — CBC
MCV: 91.5
Platelets: 247
WBC: 7

## 2011-05-19 LAB — COMPREHENSIVE METABOLIC PANEL
AST: 17
Albumin: 2.5 — ABNORMAL LOW
Chloride: 104
Creatinine, Ser: 1.39 — ABNORMAL HIGH
GFR calc Af Amer: 44 — ABNORMAL LOW
Potassium: 3.6
Total Bilirubin: 0.9

## 2011-05-19 LAB — DIFFERENTIAL
Basophils Absolute: 0
Basophils Relative: 1
Eosinophils Relative: 1
Lymphocytes Relative: 22
Monocytes Absolute: 0.7

## 2011-05-22 LAB — CBC
HCT: 28 — ABNORMAL LOW
HCT: 30.2 — ABNORMAL LOW
HCT: 31.5 — ABNORMAL LOW
HCT: 32.4 — ABNORMAL LOW
HCT: 34.3 — ABNORMAL LOW
Hemoglobin: 11.6 — ABNORMAL LOW
Hemoglobin: 9.9 — ABNORMAL LOW
MCHC: 33.5
MCHC: 33.7
MCHC: 34.1
MCV: 89.9
MCV: 90.2
MCV: 90.8
MCV: 91.6
Platelets: 120 — ABNORMAL LOW
Platelets: 147 — ABNORMAL LOW
Platelets: 153
Platelets: 201
Platelets: 284
RBC: 3.22 — ABNORMAL LOW
RBC: 3.32 — ABNORMAL LOW
RBC: 3.76 — ABNORMAL LOW
RDW: 13.6
RDW: 13.7
RDW: 13.8
RDW: 14.1
WBC: 5
WBC: 5.3
WBC: 6.7

## 2011-05-22 LAB — COMPREHENSIVE METABOLIC PANEL
ALT: 17
ALT: 22
ALT: 25
ALT: 28
AST: 17
AST: 21
AST: 24
AST: 30
Albumin: 2.5 — ABNORMAL LOW
Albumin: 2.6 — ABNORMAL LOW
Albumin: 2.8 — ABNORMAL LOW
Albumin: 2.9 — ABNORMAL LOW
Albumin: 4.1
Alkaline Phosphatase: 42
Alkaline Phosphatase: 44
Alkaline Phosphatase: 56
Alkaline Phosphatase: 56
Alkaline Phosphatase: 63
BUN: 17
BUN: 24 — ABNORMAL HIGH
BUN: 25 — ABNORMAL HIGH
BUN: 29 — ABNORMAL HIGH
BUN: 37 — ABNORMAL HIGH
CO2: 22
CO2: 23
CO2: 25
CO2: 26
Calcium: 7.6 — ABNORMAL LOW
Calcium: 8.3 — ABNORMAL LOW
Calcium: 8.5
Chloride: 102
Chloride: 97
Chloride: 99
Creatinine, Ser: 1.06
Creatinine, Ser: 1.38 — ABNORMAL HIGH
Creatinine, Ser: 1.47 — ABNORMAL HIGH
Creatinine, Ser: 2.08 — ABNORMAL HIGH
GFR calc Af Amer: 44 — ABNORMAL LOW
GFR calc Af Amer: 46 — ABNORMAL LOW
GFR calc Af Amer: 47 — ABNORMAL LOW
GFR calc non Af Amer: 35 — ABNORMAL LOW
GFR calc non Af Amer: 36 — ABNORMAL LOW
GFR calc non Af Amer: 49 — ABNORMAL LOW
Glucose, Bld: 171 — ABNORMAL HIGH
Glucose, Bld: 233 — ABNORMAL HIGH
Potassium: 3.3 — ABNORMAL LOW
Potassium: 3.4 — ABNORMAL LOW
Potassium: 3.4 — ABNORMAL LOW
Potassium: 3.9
Potassium: 4.1
Potassium: 4.3
Sodium: 132 — ABNORMAL LOW
Sodium: 133 — ABNORMAL LOW
Sodium: 135
Sodium: 135
Sodium: 137
Total Bilirubin: 0.9
Total Bilirubin: 1
Total Bilirubin: 1.1
Total Protein: 5.3 — ABNORMAL LOW
Total Protein: 5.4 — ABNORMAL LOW
Total Protein: 5.4 — ABNORMAL LOW
Total Protein: 5.5 — ABNORMAL LOW
Total Protein: 6.1

## 2011-05-22 LAB — DIFFERENTIAL
Basophils Absolute: 0
Basophils Absolute: 0
Basophils Absolute: 0.1
Basophils Relative: 0
Basophils Relative: 0
Basophils Relative: 0
Basophils Relative: 1
Eosinophils Absolute: 0 — ABNORMAL LOW
Eosinophils Absolute: 0 — ABNORMAL LOW
Eosinophils Absolute: 0 — ABNORMAL LOW
Eosinophils Relative: 0
Eosinophils Relative: 0
Eosinophils Relative: 1
Lymphocytes Relative: 15
Lymphocytes Relative: 17
Lymphocytes Relative: 18
Lymphocytes Relative: 8 — ABNORMAL LOW
Lymphs Abs: 0.4 — ABNORMAL LOW
Lymphs Abs: 0.8
Lymphs Abs: 1.2
Lymphs Abs: 1.4
Monocytes Absolute: 0.1
Monocytes Absolute: 0.2
Monocytes Absolute: 0.4
Monocytes Absolute: 0.5
Monocytes Absolute: 0.5
Monocytes Absolute: 0.7
Monocytes Relative: 2 — ABNORMAL LOW
Monocytes Relative: 4
Monocytes Relative: 5
Monocytes Relative: 5
Monocytes Relative: 6
Monocytes Relative: 8
Neutro Abs: 13.9 — ABNORMAL HIGH
Neutro Abs: 4.3
Neutro Abs: 4.6
Neutro Abs: 4.9
Neutro Abs: 5
Neutro Abs: 9.2 — ABNORMAL HIGH
Neutrophils Relative %: 82 — ABNORMAL HIGH
Neutrophils Relative %: 93 — ABNORMAL HIGH

## 2011-05-22 LAB — URINALYSIS, ROUTINE W REFLEX MICROSCOPIC
Glucose, UA: NEGATIVE
Hgb urine dipstick: NEGATIVE
Protein, ur: NEGATIVE
Specific Gravity, Urine: 1.021
pH: 5.5

## 2011-05-22 LAB — URINE MICROSCOPIC-ADD ON

## 2011-05-22 LAB — PROTIME-INR
INR: 1.9 — ABNORMAL HIGH
INR: 2 — ABNORMAL HIGH
INR: 2.3 — ABNORMAL HIGH
INR: 2.4 — ABNORMAL HIGH
INR: >10.8
Prothrombin Time: 23.4 — ABNORMAL HIGH
Prothrombin Time: 26 — ABNORMAL HIGH
Prothrombin Time: 27 — ABNORMAL HIGH
Prothrombin Time: >90 — ABNORMAL HIGH

## 2011-05-22 LAB — B-NATRIURETIC PEPTIDE (CONVERTED LAB): Pro B Natriuretic peptide (BNP): 494 — ABNORMAL HIGH

## 2011-05-22 LAB — POCT CARDIAC MARKERS
CKMB, poc: 2
Troponin i, poc: <0.05

## 2011-05-22 LAB — TSH: TSH: 1.129

## 2011-05-22 LAB — MAGNESIUM: Magnesium: 1.5

## 2011-07-31 ENCOUNTER — Encounter: Payer: Self-pay | Admitting: Cardiology

## 2011-07-31 ENCOUNTER — Ambulatory Visit (INDEPENDENT_AMBULATORY_CARE_PROVIDER_SITE_OTHER): Payer: Medicare Other | Admitting: Cardiology

## 2011-07-31 VITALS — BP 121/61 | HR 90 | Wt 136.0 lb

## 2011-07-31 DIAGNOSIS — I10 Essential (primary) hypertension: Secondary | ICD-10-CM

## 2011-07-31 DIAGNOSIS — E785 Hyperlipidemia, unspecified: Secondary | ICD-10-CM

## 2011-07-31 DIAGNOSIS — I4891 Unspecified atrial fibrillation: Secondary | ICD-10-CM

## 2011-07-31 NOTE — Patient Instructions (Signed)
Your physician wants you to follow-up in: 6 MONTHS You will receive a reminder letter in the mail two months in advance. If you don't receive a letter, please call our office to schedule the follow-up appointment. 

## 2011-07-31 NOTE — Progress Notes (Signed)
HPI: 77 year old female previously followed by Dr. Deborah Chalk for followup of atrial fibrillation. Patient has permanent atrial fibrillation. Echocardiogram in August of 2011 showed normal LV function, mild left ventricular hypertrophy, mild left atrial enlargement, mild aortic and tricuspid regurgitation. Holter monitor in February of 2010 showed A. fib with controlled ventricular response. Myoview in June of 2008 showed normal LV function with an ejection fraction of 63% and normal perfusion. Patient last seen in June of 2012. Since then, the patient denies any dyspnea on exertion, orthopnea, PND, pedal edema, palpitations, syncope or chest pain.   Current Outpatient Prescriptions  Medication Sig Dispense Refill  . Ascorbic Acid (VITAMIN C PO) Take by mouth daily.        . Cholecalciferol (VITAMIN D3) 400 UNITS CAPS Take 1 tablet by mouth daily.        . Cyanocobalamin (VITAMIN B12 PO) Take by mouth daily.        . fish oil-omega-3 fatty acids 1000 MG capsule Take by mouth daily.        Marland Kitchen FOLIC ACID PO Take 1 tablet by mouth daily.        . furosemide (LASIX) 20 MG tablet        . gabapentin (NEURONTIN) 300 MG capsule Take 300 mg by mouth daily.       Marland Kitchen glimepiride (AMARYL) 4 MG tablet Take 4 mg by mouth daily before breakfast.        . levothyroxine (SYNTHROID, LEVOTHROID) 50 MCG tablet Take 50 mcg by mouth daily.        Marland Kitchen lisinopril-hydrochlorothiazide (PRINZIDE,ZESTORETIC) 20-25 MG per tablet Take 1 tablet by mouth daily.        . metFORMIN (GLUCOPHAGE) 500 MG tablet Take 500 mg by mouth 2 (two) times daily with a meal.        . methotrexate (RHEUMATREX) 10 MG tablet as directed. Caution: Chemotherapy. Protect from light.       . multivitamin-iron-minerals-folic acid (CENTRUM) chewable tablet Chew 1 tablet by mouth daily.        . predniSONE (DELTASONE) 5 MG tablet 1 1/2 TAB PO QD       . tiotropium (SPIRIVA) 18 MCG inhalation capsule Place 18 mcg into inhaler and inhale daily.        Marland Kitchen  warfarin (COUMADIN) 2.5 MG tablet Take 2.5 mg by mouth. As directed by the [facility]      . DISCONTD: furosemide (LASIX) 20 MG tablet TAKE 1 TABLET EVERY MORNING  30 tablet  5     Past Medical History  Diagnosis Date  . Chronic atrial fibrillation   . Lung disease     BRONCHOSPASTIC  . Neuropathy   . Hypertension   . Hypercholesterolemia   . Hypothyroidism   . Diabetes mellitus     NON-INSULIN DEPENDENT  . Anemia     Past Surgical History  Procedure Date  . Total hip arthroplasty     LEFT HIP  . Appendectomy   . Back surgery     History   Social History  . Marital Status: Widowed    Spouse Name: N/A    Number of Children: N/A  . Years of Education: N/A   Occupational History  . Not on file.   Social History Main Topics  . Smoking status: Never Smoker   . Smokeless tobacco: Never Used  . Alcohol Use: No  . Drug Use: No  . Sexually Active: Not on file   Other Topics Concern  . Not  on file   Social History Narrative  . No narrative on file    ROS: no fevers or chills, productive cough, hemoptysis, dysphasia, odynophagia, melena, hematochezia, dysuria, hematuria, rash, seizure activity, orthopnea, PND, pedal edema, claudication. Remaining systems are negative.  Physical Exam: Well-developed well-nourished in no acute distress.  Skin is warm and dry.  HEENT is normal.  Neck is supple. No thyromegaly.  Chest is clear to auscultation with normal expansion.  Cardiovascular exam is irregular Abdominal exam nontender or distended. No masses palpated. Extremities show no edema. neuro grossly intact  ECG atrial fibrillation, cannot rule out prior septal infarct.

## 2011-07-31 NOTE — Assessment & Plan Note (Signed)
Blood pressure controlled with present medications. Will continue. 

## 2011-07-31 NOTE — Assessment & Plan Note (Signed)
Continue Coumadin with goal INR 2-3. Coumadin is followed by primary care. Her rate is controlled on no medications.

## 2011-07-31 NOTE — Assessment & Plan Note (Signed)
Management per primary care. 

## 2011-08-25 DIAGNOSIS — L600 Ingrowing nail: Secondary | ICD-10-CM | POA: Diagnosis not present

## 2011-08-25 DIAGNOSIS — Z7901 Long term (current) use of anticoagulants: Secondary | ICD-10-CM | POA: Diagnosis not present

## 2011-08-25 DIAGNOSIS — L97409 Non-pressure chronic ulcer of unspecified heel and midfoot with unspecified severity: Secondary | ICD-10-CM | POA: Diagnosis not present

## 2011-08-25 DIAGNOSIS — L97509 Non-pressure chronic ulcer of other part of unspecified foot with unspecified severity: Secondary | ICD-10-CM | POA: Diagnosis not present

## 2011-08-25 DIAGNOSIS — E119 Type 2 diabetes mellitus without complications: Secondary | ICD-10-CM | POA: Diagnosis not present

## 2011-08-28 ENCOUNTER — Telehealth: Payer: Self-pay | Admitting: Cardiology

## 2011-08-28 ENCOUNTER — Encounter (HOSPITAL_BASED_OUTPATIENT_CLINIC_OR_DEPARTMENT_OTHER): Payer: Medicare Other | Attending: Internal Medicine

## 2011-08-28 DIAGNOSIS — Z79899 Other long term (current) drug therapy: Secondary | ICD-10-CM | POA: Diagnosis not present

## 2011-08-28 DIAGNOSIS — E1169 Type 2 diabetes mellitus with other specified complication: Secondary | ICD-10-CM | POA: Insufficient documentation

## 2011-08-28 DIAGNOSIS — I4891 Unspecified atrial fibrillation: Secondary | ICD-10-CM | POA: Diagnosis not present

## 2011-08-28 DIAGNOSIS — L97509 Non-pressure chronic ulcer of other part of unspecified foot with unspecified severity: Secondary | ICD-10-CM | POA: Insufficient documentation

## 2011-08-28 DIAGNOSIS — Z7901 Long term (current) use of anticoagulants: Secondary | ICD-10-CM | POA: Insufficient documentation

## 2011-08-28 DIAGNOSIS — J449 Chronic obstructive pulmonary disease, unspecified: Secondary | ICD-10-CM | POA: Diagnosis not present

## 2011-08-28 DIAGNOSIS — J4489 Other specified chronic obstructive pulmonary disease: Secondary | ICD-10-CM | POA: Insufficient documentation

## 2011-08-28 DIAGNOSIS — T8189XA Other complications of procedures, not elsewhere classified, initial encounter: Secondary | ICD-10-CM | POA: Diagnosis not present

## 2011-08-28 NOTE — Telephone Encounter (Signed)
New Msg: Pt daughter calling to schedule lower arterial duplex due to ulcers on pt feet. Please return pt daughter call to discuss further.

## 2011-08-28 NOTE — Telephone Encounter (Signed)
I talked with pt's daughter. [facility] faxed an order 08/25/11 for a LEA  to be scheduled due to foot ulcer. I talked with Charlynn Court and checked Dr Ludwig Clarks box--cannot locate order. Pt's daughter will ask [facility] to refax order to [facility] tomorrow so LEA can be scheduled.

## 2011-08-29 DIAGNOSIS — H43819 Vitreous degeneration, unspecified eye: Secondary | ICD-10-CM | POA: Diagnosis not present

## 2011-08-29 DIAGNOSIS — H35379 Puckering of macula, unspecified eye: Secondary | ICD-10-CM | POA: Diagnosis not present

## 2011-08-29 DIAGNOSIS — H353190 Nonexudative age-related macular degeneration, unspecified eye, stage unspecified: Secondary | ICD-10-CM | POA: Diagnosis not present

## 2011-08-29 NOTE — Progress Notes (Signed)
[facility]  NAME:  Sydney [name], Sydney [name]:  [id number]  MEDICAL RECORD NO.:  [id number]      DATE OF BIRTH:  12/10/1920  PHYSICIAN:  Jonelle Sports. Mutasim Tuckey, M.D.  VISIT DATE:  08/28/2011                                  OFFICE VISIT   HISTORY:  This 78 year old white female with a number of complex medical issues is seen for ulcerations on the tip of the right hallux and another on the lateral aspect of the left 5th toe.  The first of which has been present approximately 2 weeks and the second of which was discovered only 4 days ago.  She was seen by the podiatrist and also by Dr. Jacky Kindle with the podiatrist treating her with limited debridement and Silvadene cream and Dr. Jacky Kindle having added doxycycline. Apparently x-rays were made and we are making an effort to obtain those films.  The patient is here now for our evaluation and advice with regarding ongoing therapy.  It is noted that the wounds are not painful, although she admittedly has rather severe generalized peripheral neuropathy based on diabetes and rheumatoid arthritis and they have not drained or been malodorous.  She has no significant pain.  PAST MEDICAL HISTORY:  OPERATIONS:  A cholecystectomy at age 75, hysterectomy in 1975, cervical spine surgery in the 1980s, and left hip fracture replacement in October 2011.  She has had other hospitalizations in association with her chronic lung disease and atrial fibrillation.  ALLERGIES:  She is said to be allergic to no medications.  CURRENT MEDICATIONS: 1. Lisinopril 20/25 one daily. 2. Levothyroxine 50 mcg daily. 3. Spiriva inhaler daily to b.i.d. 4. Gabapentin 300 mg daily. 5. Furosemide 20 mg every other day. 6. Metformin 1000 mg daily. 7. Glimepiride 4 mg daily. 8. Warfarin 2.5 mg every other day and 5 mg on the alternating days. 9. Methotrexate 0.4 mg weekly. 10.Folic acid 1 mg daily. 11.Prednisone 5 mg daily. 12.Vitamin  B12. 13.Vitamin C. 14.Vitamin D3. 15.Calcium. 16.Fish oil. 17.Eye health multiple vitamin all taken on a daily basis.  PERSONAL HISTORY:  The patient lives alone and apparently is able to meet most of her needs.  She does have a sister who lives here in town. She has been fully immunized.  FAMILY HISTORY:  Not available in detail, but apparently is positive for diabetes to include type 1 diabetes in a son, also positive for hypertension, peripheral vascular disease.  REVIEW OF SYSTEMS:  HEAD, EYES, EARS, NOSE, THROAT:  The patient has had cataract surgery.  Thinks her vision is remarkable for her age of 34 and I would agree.  Hearing is adequate.  PULMONARY:  The patient has a history of smoking in her teen years, but none for many many decades. Does have some degree of COPD for which she takes the Spiriva inhaler. She has had trouble with a cardiac arrhythmias, primarily atrial fibrillation and remains on warfarin for that.  GASTROINTESTINAL: History of cholecystectomy.  No current symptomatology or problems at the moment.  Does mention that she has had considerable weight loss over the past 15 months since her hip fracture without a definite basis being identified.  GENITOURINARY:  No known urinary tract problems.  No known renal insufficiency. HEMATOLOGIC:  No history  of leukemia or lymphoma.  Has been anemic since the time of her surgery.  ENDOCRINE:  The patient has compensated primary hypothyroidism.  MUSCULOSKELETAL:  Has some osteoarthritis and also has rheumatoid.  It was for the rheumatoid flare that she was placed on prednisone which has now been tapered back to 5 mg daily.  PHYSICAL EXAMINATION:  VITAL SIGNS:  Blood pressure is 138/75, pulse 90 and irregular, respirations 18, temperature 97.7.  Capillary blood glucose 277.  Her weight is 136, her height 5 feet 5 inches. GENERAL:  This is a pleasant elderly alert and cooperative white female in no immediate  distress. HEENT:  Her mucous membranes are moist.  Slightly pale. SKIN:  Good texture and turgor. NECK:  Her carotid pulses show no bruits. Her neck veins are flat. CHEST:  Grossly clear to auscultation with somewhat distant breath sounds. HEART:  Sounds are distant as well with irregular rhythm.  No definite murmur or gallop. ABDOMEN:  Protuberant, soft, without organomegaly or masses. EXTREMITIES: Some chronic venous change bilaterally, but no significant edema.  Pulses are not palpable below the knees bilaterally, but there is no imminent critical ischemia that I can appreciate.  On the tip of the right hallux is an open ulcer with some loose semi- necrotic skin which probes to the tip of the distal phalanx.  There is no surrounding erythema.  No drainage.  No odor.  On the lateral aspect of the left 5th toe is again a small open ulceration that does not probe the bone and which likewise appears clean without drainage or significant odor.  IMPRESSION:  Diabetic foot ulcers bilaterally, Wagner 3 on the right hallux and Wagner 2 on the left 5th toe.  Some immunocompromise by virtue of clinical conditions and medication.  DISPOSITION:  The ulcer on the right toe is debrided with removal of the semi-necrotic skin and also to obliterate some undermining.  The ulcer after completion measures 1.8 x 1.0 x 0.3 cm.  The ulcer on the left 5th toe requires no debridement.  Following this, both wounds were treated with application of Iodosorb gel and they are covered with protective dressings, then with a bulky forefoot dressing with Lamisil placed between the toes because of the tendency toward hallux valgus and significant overriding of the 2nd toe bilaterally.  She is placed in healing sandals.  Culture was made of her right hallux wound and she will remain on her current doxycycline until the results of that cultures known.  We will continue our efforts to get copies of her  x-ray.  We will see the patient again in 1 week.          ______________________________ Jonelle Sports. Cheryll Cockayne, M.D.     RES/MEDQ  D:  08/28/2011  T:  08/29/2011  Job:  [id number]

## 2011-08-31 ENCOUNTER — Other Ambulatory Visit: Payer: Self-pay | Admitting: *Deleted

## 2011-08-31 DIAGNOSIS — L97909 Non-pressure chronic ulcer of unspecified part of unspecified lower leg with unspecified severity: Secondary | ICD-10-CM

## 2011-09-01 ENCOUNTER — Encounter (INDEPENDENT_AMBULATORY_CARE_PROVIDER_SITE_OTHER): Payer: Medicare Other | Admitting: *Deleted

## 2011-09-01 ENCOUNTER — Encounter: Payer: Medicare Other | Admitting: *Deleted

## 2011-09-01 DIAGNOSIS — E119 Type 2 diabetes mellitus without complications: Secondary | ICD-10-CM | POA: Diagnosis not present

## 2011-09-01 DIAGNOSIS — I739 Peripheral vascular disease, unspecified: Secondary | ICD-10-CM

## 2011-09-01 DIAGNOSIS — L97909 Non-pressure chronic ulcer of unspecified part of unspecified lower leg with unspecified severity: Secondary | ICD-10-CM

## 2011-09-01 DIAGNOSIS — E039 Hypothyroidism, unspecified: Secondary | ICD-10-CM | POA: Diagnosis not present

## 2011-09-01 DIAGNOSIS — I509 Heart failure, unspecified: Secondary | ICD-10-CM | POA: Diagnosis not present

## 2011-09-01 DIAGNOSIS — J449 Chronic obstructive pulmonary disease, unspecified: Secondary | ICD-10-CM | POA: Diagnosis not present

## 2011-09-04 DIAGNOSIS — I4891 Unspecified atrial fibrillation: Secondary | ICD-10-CM | POA: Diagnosis not present

## 2011-09-04 DIAGNOSIS — L97509 Non-pressure chronic ulcer of other part of unspecified foot with unspecified severity: Secondary | ICD-10-CM | POA: Diagnosis not present

## 2011-09-04 DIAGNOSIS — E1169 Type 2 diabetes mellitus with other specified complication: Secondary | ICD-10-CM | POA: Diagnosis not present

## 2011-09-04 DIAGNOSIS — J449 Chronic obstructive pulmonary disease, unspecified: Secondary | ICD-10-CM | POA: Diagnosis not present

## 2011-09-11 DIAGNOSIS — I4891 Unspecified atrial fibrillation: Secondary | ICD-10-CM | POA: Diagnosis not present

## 2011-09-11 DIAGNOSIS — J449 Chronic obstructive pulmonary disease, unspecified: Secondary | ICD-10-CM | POA: Diagnosis not present

## 2011-09-11 DIAGNOSIS — E1169 Type 2 diabetes mellitus with other specified complication: Secondary | ICD-10-CM | POA: Diagnosis not present

## 2011-09-11 DIAGNOSIS — L97509 Non-pressure chronic ulcer of other part of unspecified foot with unspecified severity: Secondary | ICD-10-CM | POA: Diagnosis not present

## 2011-09-13 DIAGNOSIS — Z7901 Long term (current) use of anticoagulants: Secondary | ICD-10-CM | POA: Diagnosis not present

## 2011-09-13 DIAGNOSIS — I4891 Unspecified atrial fibrillation: Secondary | ICD-10-CM | POA: Diagnosis not present

## 2011-09-18 ENCOUNTER — Encounter (HOSPITAL_BASED_OUTPATIENT_CLINIC_OR_DEPARTMENT_OTHER): Payer: Medicare Other | Attending: Internal Medicine

## 2011-09-18 DIAGNOSIS — L97509 Non-pressure chronic ulcer of other part of unspecified foot with unspecified severity: Secondary | ICD-10-CM | POA: Insufficient documentation

## 2011-09-18 DIAGNOSIS — E1169 Type 2 diabetes mellitus with other specified complication: Secondary | ICD-10-CM | POA: Insufficient documentation

## 2011-09-21 DIAGNOSIS — Z7901 Long term (current) use of anticoagulants: Secondary | ICD-10-CM | POA: Diagnosis not present

## 2011-09-21 DIAGNOSIS — I4891 Unspecified atrial fibrillation: Secondary | ICD-10-CM | POA: Diagnosis not present

## 2011-09-25 ENCOUNTER — Encounter (HOSPITAL_BASED_OUTPATIENT_CLINIC_OR_DEPARTMENT_OTHER): Payer: Medicare Other

## 2011-09-27 DIAGNOSIS — Z79899 Other long term (current) drug therapy: Secondary | ICD-10-CM | POA: Diagnosis not present

## 2011-09-27 DIAGNOSIS — M069 Rheumatoid arthritis, unspecified: Secondary | ICD-10-CM | POA: Diagnosis not present

## 2011-09-28 DIAGNOSIS — I4891 Unspecified atrial fibrillation: Secondary | ICD-10-CM | POA: Diagnosis not present

## 2011-09-28 DIAGNOSIS — Z7901 Long term (current) use of anticoagulants: Secondary | ICD-10-CM | POA: Diagnosis not present

## 2011-10-02 DIAGNOSIS — E1169 Type 2 diabetes mellitus with other specified complication: Secondary | ICD-10-CM | POA: Diagnosis not present

## 2011-10-02 DIAGNOSIS — L97509 Non-pressure chronic ulcer of other part of unspecified foot with unspecified severity: Secondary | ICD-10-CM | POA: Diagnosis not present

## 2011-10-04 DIAGNOSIS — I4891 Unspecified atrial fibrillation: Secondary | ICD-10-CM | POA: Diagnosis not present

## 2011-10-04 DIAGNOSIS — Z7901 Long term (current) use of anticoagulants: Secondary | ICD-10-CM | POA: Diagnosis not present

## 2011-10-09 DIAGNOSIS — E1169 Type 2 diabetes mellitus with other specified complication: Secondary | ICD-10-CM | POA: Diagnosis not present

## 2011-10-09 DIAGNOSIS — L97509 Non-pressure chronic ulcer of other part of unspecified foot with unspecified severity: Secondary | ICD-10-CM | POA: Diagnosis not present

## 2011-10-16 ENCOUNTER — Encounter (HOSPITAL_BASED_OUTPATIENT_CLINIC_OR_DEPARTMENT_OTHER): Payer: Medicare Other | Attending: Internal Medicine

## 2011-10-16 ENCOUNTER — Ambulatory Visit (HOSPITAL_COMMUNITY)
Admission: RE | Admit: 2011-10-16 | Discharge: 2011-10-16 | Disposition: A | Discharge status: Home / Self Care | Payer: Medicare Other | Source: Ambulatory Visit | Attending: Internal Medicine | Admitting: Internal Medicine

## 2011-10-16 ENCOUNTER — Other Ambulatory Visit (HOSPITAL_BASED_OUTPATIENT_CLINIC_OR_DEPARTMENT_OTHER): Payer: Self-pay | Admitting: Internal Medicine

## 2011-10-16 DIAGNOSIS — L97509 Non-pressure chronic ulcer of other part of unspecified foot with unspecified severity: Secondary | ICD-10-CM | POA: Diagnosis not present

## 2011-10-16 DIAGNOSIS — M8448XA Pathological fracture, other site, initial encounter for fracture: Secondary | ICD-10-CM | POA: Diagnosis not present

## 2011-10-16 DIAGNOSIS — Y838 Other surgical procedures as the cause of abnormal reaction of the patient, or of later complication, without mention of misadventure at the time of the procedure: Secondary | ICD-10-CM | POA: Insufficient documentation

## 2011-10-16 DIAGNOSIS — Z9989 Dependence on other enabling machines and devices: Secondary | ICD-10-CM | POA: Diagnosis not present

## 2011-10-16 DIAGNOSIS — S91309A Unspecified open wound, unspecified foot, initial encounter: Secondary | ICD-10-CM | POA: Diagnosis not present

## 2011-10-16 DIAGNOSIS — E1169 Type 2 diabetes mellitus with other specified complication: Secondary | ICD-10-CM | POA: Diagnosis not present

## 2011-10-16 DIAGNOSIS — T8189XA Other complications of procedures, not elsewhere classified, initial encounter: Secondary | ICD-10-CM | POA: Insufficient documentation

## 2011-10-18 DIAGNOSIS — L97509 Non-pressure chronic ulcer of other part of unspecified foot with unspecified severity: Secondary | ICD-10-CM | POA: Diagnosis not present

## 2011-10-18 DIAGNOSIS — E1169 Type 2 diabetes mellitus with other specified complication: Secondary | ICD-10-CM | POA: Diagnosis not present

## 2011-10-19 DIAGNOSIS — E1169 Type 2 diabetes mellitus with other specified complication: Secondary | ICD-10-CM | POA: Diagnosis not present

## 2011-10-19 DIAGNOSIS — L97509 Non-pressure chronic ulcer of other part of unspecified foot with unspecified severity: Secondary | ICD-10-CM | POA: Diagnosis not present

## 2011-10-19 LAB — GLUCOSE, CAPILLARY
Glucose-Capillary: 147 mg/dL — ABNORMAL HIGH (ref 70–99)
Glucose-Capillary: 130 mg/dL — ABNORMAL HIGH (ref 70–99)
Glucose-Capillary: 183 mg/dL — ABNORMAL HIGH (ref 70–99)

## 2011-10-20 DIAGNOSIS — L97509 Non-pressure chronic ulcer of other part of unspecified foot with unspecified severity: Secondary | ICD-10-CM | POA: Diagnosis not present

## 2011-10-20 DIAGNOSIS — E1169 Type 2 diabetes mellitus with other specified complication: Secondary | ICD-10-CM | POA: Diagnosis not present

## 2011-10-20 LAB — GLUCOSE, CAPILLARY: Glucose-Capillary: 200 mg/dL — ABNORMAL HIGH (ref 70–99)

## 2011-10-23 DIAGNOSIS — L97509 Non-pressure chronic ulcer of other part of unspecified foot with unspecified severity: Secondary | ICD-10-CM | POA: Diagnosis not present

## 2011-10-23 DIAGNOSIS — E1169 Type 2 diabetes mellitus with other specified complication: Secondary | ICD-10-CM | POA: Diagnosis not present

## 2011-10-24 DIAGNOSIS — L97509 Non-pressure chronic ulcer of other part of unspecified foot with unspecified severity: Secondary | ICD-10-CM | POA: Diagnosis not present

## 2011-10-24 DIAGNOSIS — Z7901 Long term (current) use of anticoagulants: Secondary | ICD-10-CM | POA: Diagnosis not present

## 2011-10-24 DIAGNOSIS — E1169 Type 2 diabetes mellitus with other specified complication: Secondary | ICD-10-CM | POA: Diagnosis not present

## 2011-10-24 DIAGNOSIS — I4891 Unspecified atrial fibrillation: Secondary | ICD-10-CM | POA: Diagnosis not present

## 2011-10-24 LAB — GLUCOSE, CAPILLARY: Glucose-Capillary: 253 mg/dL — ABNORMAL HIGH (ref 70–99)

## 2011-10-25 DIAGNOSIS — L97509 Non-pressure chronic ulcer of other part of unspecified foot with unspecified severity: Secondary | ICD-10-CM | POA: Diagnosis not present

## 2011-10-25 DIAGNOSIS — E1169 Type 2 diabetes mellitus with other specified complication: Secondary | ICD-10-CM | POA: Diagnosis not present

## 2011-10-26 DIAGNOSIS — L97509 Non-pressure chronic ulcer of other part of unspecified foot with unspecified severity: Secondary | ICD-10-CM | POA: Diagnosis not present

## 2011-10-26 DIAGNOSIS — E1169 Type 2 diabetes mellitus with other specified complication: Secondary | ICD-10-CM | POA: Diagnosis not present

## 2011-10-26 LAB — GLUCOSE, CAPILLARY: Glucose-Capillary: 290 mg/dL — ABNORMAL HIGH (ref 70–99)

## 2011-10-27 DIAGNOSIS — L97509 Non-pressure chronic ulcer of other part of unspecified foot with unspecified severity: Secondary | ICD-10-CM | POA: Diagnosis not present

## 2011-10-27 DIAGNOSIS — E1169 Type 2 diabetes mellitus with other specified complication: Secondary | ICD-10-CM | POA: Diagnosis not present

## 2011-10-27 LAB — GLUCOSE, CAPILLARY: Glucose-Capillary: 226 mg/dL — ABNORMAL HIGH (ref 70–99)

## 2011-10-30 DIAGNOSIS — L97509 Non-pressure chronic ulcer of other part of unspecified foot with unspecified severity: Secondary | ICD-10-CM | POA: Diagnosis not present

## 2011-10-30 DIAGNOSIS — E1169 Type 2 diabetes mellitus with other specified complication: Secondary | ICD-10-CM | POA: Diagnosis not present

## 2011-10-31 DIAGNOSIS — E1169 Type 2 diabetes mellitus with other specified complication: Secondary | ICD-10-CM | POA: Diagnosis not present

## 2011-10-31 DIAGNOSIS — L97509 Non-pressure chronic ulcer of other part of unspecified foot with unspecified severity: Secondary | ICD-10-CM | POA: Diagnosis not present

## 2011-10-31 LAB — GLUCOSE, CAPILLARY: Glucose-Capillary: 165 mg/dL — ABNORMAL HIGH (ref 70–99)

## 2011-11-01 DIAGNOSIS — E1169 Type 2 diabetes mellitus with other specified complication: Secondary | ICD-10-CM | POA: Diagnosis not present

## 2011-11-01 DIAGNOSIS — L97509 Non-pressure chronic ulcer of other part of unspecified foot with unspecified severity: Secondary | ICD-10-CM | POA: Diagnosis not present

## 2011-11-02 DIAGNOSIS — E1159 Type 2 diabetes mellitus with other circulatory complications: Secondary | ICD-10-CM | POA: Diagnosis not present

## 2011-11-02 DIAGNOSIS — Z7901 Long term (current) use of anticoagulants: Secondary | ICD-10-CM | POA: Diagnosis not present

## 2011-11-02 DIAGNOSIS — M48062 Spinal stenosis, lumbar region with neurogenic claudication: Secondary | ICD-10-CM | POA: Diagnosis not present

## 2011-11-02 DIAGNOSIS — E1169 Type 2 diabetes mellitus with other specified complication: Secondary | ICD-10-CM | POA: Diagnosis not present

## 2011-11-02 DIAGNOSIS — L97409 Non-pressure chronic ulcer of unspecified heel and midfoot with unspecified severity: Secondary | ICD-10-CM | POA: Diagnosis not present

## 2011-11-02 DIAGNOSIS — I4891 Unspecified atrial fibrillation: Secondary | ICD-10-CM | POA: Diagnosis not present

## 2011-11-02 DIAGNOSIS — L97509 Non-pressure chronic ulcer of other part of unspecified foot with unspecified severity: Secondary | ICD-10-CM | POA: Diagnosis not present

## 2011-11-02 LAB — GLUCOSE, CAPILLARY: Glucose-Capillary: 181 mg/dL — ABNORMAL HIGH (ref 70–99)

## 2011-11-02 NOTE — Progress Notes (Signed)
[facility]  NAME:  Sydney [name], TRAGER NO.:  [id number]  MEDICAL RECORD NO.:  [id number]      DATE OF BIRTH:  16-Jan-1921  PHYSICIAN:  Wayland Denis, DO       VISIT DATE:  11/01/2011                                  OFFICE VISIT   [name] is an 78 year old female who is being treated for bilateral lower extremity ulcers, which include the fourth and fifth toes on the left and the right great toe.  She has been undergoing treatment with doxycycline and hyperbaric oxygen as well as local dressings; however, there is some question about her progress.  Dr. Cheryll Cockayne asked that I take a look at the patient today for second opinion.  She has significant amount of bone exposed on her right great toe with very poor circulation and maceration of the surrounding soft tissue.  Pulse is weak. Essentially, there is capillary refill, it is very minimal of the toe. There is some scabbing and an odor.  At this point, I recommend significant excision with likely amputation of the toe and we have referred her to Dr. Magnus Ivan for this.  I also spoke with the patient's family to prepare them of the possibility.  We will certainly do all that we can to salvage it, but at this point, I am very concerned that if we do not pursue amputation that this could lead to more proximal amputation.     Wayland Denis, DO     CS/MEDQ  D:  11/01/2011  T:  11/02/2011  Job:  [id number]

## 2011-11-03 DIAGNOSIS — L97509 Non-pressure chronic ulcer of other part of unspecified foot with unspecified severity: Secondary | ICD-10-CM | POA: Diagnosis not present

## 2011-11-03 DIAGNOSIS — E1169 Type 2 diabetes mellitus with other specified complication: Secondary | ICD-10-CM | POA: Diagnosis not present

## 2011-11-03 LAB — GLUCOSE, CAPILLARY
Glucose-Capillary: 150 mg/dL — ABNORMAL HIGH (ref 70–99)
Glucose-Capillary: 170 mg/dL — ABNORMAL HIGH (ref 70–99)
Glucose-Capillary: 199 mg/dL — ABNORMAL HIGH (ref 70–99)

## 2011-11-06 ENCOUNTER — Other Ambulatory Visit (HOSPITAL_COMMUNITY): Payer: Self-pay | Admitting: Orthopaedic Surgery

## 2011-11-06 ENCOUNTER — Encounter (HOSPITAL_COMMUNITY): Payer: Self-pay | Admitting: Pharmacy Technician

## 2011-11-06 DIAGNOSIS — L03039 Cellulitis of unspecified toe: Secondary | ICD-10-CM | POA: Diagnosis not present

## 2011-11-06 DIAGNOSIS — E1169 Type 2 diabetes mellitus with other specified complication: Secondary | ICD-10-CM | POA: Diagnosis not present

## 2011-11-06 DIAGNOSIS — M86679 Other chronic osteomyelitis, unspecified ankle and foot: Secondary | ICD-10-CM | POA: Diagnosis not present

## 2011-11-06 DIAGNOSIS — L97509 Non-pressure chronic ulcer of other part of unspecified foot with unspecified severity: Secondary | ICD-10-CM | POA: Diagnosis not present

## 2011-11-07 ENCOUNTER — Other Ambulatory Visit (HOSPITAL_COMMUNITY): Payer: Self-pay | Admitting: *Deleted

## 2011-11-07 ENCOUNTER — Encounter (HOSPITAL_COMMUNITY)
Admission: RE | Admit: 2011-11-07 | Discharge: 2011-11-07 | Disposition: A | Discharge status: Home / Self Care | Payer: Medicare Other | Source: Ambulatory Visit | Attending: Orthopaedic Surgery | Admitting: Orthopaedic Surgery

## 2011-11-07 ENCOUNTER — Encounter (HOSPITAL_COMMUNITY): Payer: Self-pay

## 2011-11-07 DIAGNOSIS — I96 Gangrene, not elsewhere classified: Secondary | ICD-10-CM | POA: Diagnosis not present

## 2011-11-07 DIAGNOSIS — E7800 Pure hypercholesterolemia, unspecified: Secondary | ICD-10-CM | POA: Diagnosis not present

## 2011-11-07 DIAGNOSIS — E039 Hypothyroidism, unspecified: Secondary | ICD-10-CM | POA: Diagnosis not present

## 2011-11-07 DIAGNOSIS — J449 Chronic obstructive pulmonary disease, unspecified: Secondary | ICD-10-CM | POA: Diagnosis not present

## 2011-11-07 DIAGNOSIS — I10 Essential (primary) hypertension: Secondary | ICD-10-CM | POA: Diagnosis not present

## 2011-11-07 DIAGNOSIS — I4891 Unspecified atrial fibrillation: Secondary | ICD-10-CM | POA: Diagnosis not present

## 2011-11-07 DIAGNOSIS — L97509 Non-pressure chronic ulcer of other part of unspecified foot with unspecified severity: Secondary | ICD-10-CM | POA: Diagnosis not present

## 2011-11-07 DIAGNOSIS — E1169 Type 2 diabetes mellitus with other specified complication: Secondary | ICD-10-CM | POA: Diagnosis not present

## 2011-11-07 DIAGNOSIS — Z7901 Long term (current) use of anticoagulants: Secondary | ICD-10-CM | POA: Diagnosis not present

## 2011-11-07 DIAGNOSIS — M9080 Osteopathy in diseases classified elsewhere, unspecified site: Secondary | ICD-10-CM | POA: Diagnosis not present

## 2011-11-07 DIAGNOSIS — E1159 Type 2 diabetes mellitus with other circulatory complications: Secondary | ICD-10-CM | POA: Diagnosis not present

## 2011-11-07 DIAGNOSIS — M86679 Other chronic osteomyelitis, unspecified ankle and foot: Secondary | ICD-10-CM | POA: Diagnosis not present

## 2011-11-07 DIAGNOSIS — Z01812 Encounter for preprocedural laboratory examination: Secondary | ICD-10-CM | POA: Diagnosis not present

## 2011-11-07 HISTORY — DX: Chronic obstructive pulmonary disease, unspecified: J44.9

## 2011-11-07 HISTORY — DX: Reserved for inherently not codable concepts without codable children: IMO0001

## 2011-11-07 HISTORY — DX: Encounter for other specified aftercare: Z51.89

## 2011-11-07 LAB — BASIC METABOLIC PANEL
BUN: 26 mg/dL — ABNORMAL HIGH (ref 6–23)
CO2: 28 mEq/L (ref 19–32)
Chloride: 97 mEq/L (ref 96–112)
GFR calc Af Amer: 60 mL/min — ABNORMAL LOW (ref >90)
Potassium: 4.6 mEq/L (ref 3.5–5.1)

## 2011-11-07 LAB — CBC
HCT: 29.7 % — ABNORMAL LOW (ref 36.0–46.0)
Hemoglobin: 10 g/dL — ABNORMAL LOW (ref 12.0–15.0)
WBC: 11.3 10*3/uL — ABNORMAL HIGH (ref 4.0–10.5)

## 2011-11-07 LAB — SURGICAL PCR SCREEN: Staphylococcus aureus: NEGATIVE

## 2011-11-07 LAB — GLUCOSE, CAPILLARY: Glucose-Capillary: 206 mg/dL — ABNORMAL HIGH (ref 70–99)

## 2011-11-07 NOTE — Pre-Procedure Instructions (Addendum)
22 Sydney [name]  11/07/2011   Your procedure is scheduled on:  Thursday, March 28th.  Report to [facility] at 9:00AM.  Call this number if you have problems the morning of surgery: [phone]   Remember:   Do not eat food:After Midnight.   May have clear liquids: up to 4 Hours before arrival. (5:00)  Clear liquids include soda, tea, black coffee, apple or grape juice, broth.   Take these medicines the morning of surgery with A SIP OF WATER: Levothyroxine (Synthyroid),  Gabapentin (Neurotin).  Use Spiriva Inhaler   Do not wear jewelry, make-up or nail polish.  Do not wear lotions, powders, or perfumes. You may wear deodorant.  Do not shave 48 hours prior to surgery.  Do not bring valuables to the hospital.  Contacts, dentures or bridgework may not be worn into surgery.  Leave suitcase in the car. After surgery it may be brought to your room.  For patients admitted to the hospital, checkout time is 11:00 AM the day of discharge.   Patients discharged the day of surgery will not be allowed to drive home.  Name and phone number of your driver: NA  Special Instructions: CHG Shower Use Special Wash: 1/2 bottle night before surgery and 1/2 bottle morning of surgery.   Please read over the following fact sheets that you were given: Pain Booklet, Coughing and Deep Breathing, MRSA Information and Surgical Site Infection Prevention

## 2011-11-07 NOTE — Progress Notes (Signed)
SHERRI AT DR. BLACKMANS OFFICE AWARE THAT PT DID NOT HAVE PT PTT TODAY AT PRE ADMISSION VISIT EVEN THOUGH SHE IS ON  COUMADIN ... SHERRI WILL SPEAK TO DR Magnus Ivan TOMORROW ... ABOUT THIS AND ABOUT THE CONSENT THAT THE PT DID NOT SIGN AT PAT VISIT.. SHE STATES DR Magnus Ivan IS NOT GOING TO STOP COUMADIN FOR THIS PROCEDURE.

## 2011-11-08 ENCOUNTER — Other Ambulatory Visit (HOSPITAL_COMMUNITY): Payer: Medicare Other

## 2011-11-08 DIAGNOSIS — L97509 Non-pressure chronic ulcer of other part of unspecified foot with unspecified severity: Secondary | ICD-10-CM | POA: Diagnosis not present

## 2011-11-08 DIAGNOSIS — E1169 Type 2 diabetes mellitus with other specified complication: Secondary | ICD-10-CM | POA: Diagnosis not present

## 2011-11-08 DIAGNOSIS — N950 Postmenopausal bleeding: Secondary | ICD-10-CM | POA: Diagnosis not present

## 2011-11-08 LAB — GLUCOSE, CAPILLARY
Glucose-Capillary: 214 mg/dL — ABNORMAL HIGH (ref 70–99)
Glucose-Capillary: 165 mg/dL — ABNORMAL HIGH (ref 70–99)

## 2011-11-08 MED ORDER — CEFAZOLIN SODIUM 1-5 GM-% IV SOLN
1.0000 g | INTRAVENOUS | Status: AC
  Administered 2011-11-09: 1 g via INTRAVENOUS
  Filled 2011-11-08: qty 50

## 2011-11-08 NOTE — Progress Notes (Signed)
Sherri called from Dr Eliberto Ivory office ... The consent verbage will stay the same....     Also pt does not need pt or ptt for the surgery per Sherri/Dr. Magnus Ivan

## 2011-11-09 ENCOUNTER — Ambulatory Visit (HOSPITAL_COMMUNITY): Payer: Medicare Other | Admitting: Anesthesiology

## 2011-11-09 ENCOUNTER — Encounter (HOSPITAL_COMMUNITY): Payer: Self-pay | Admitting: Surgery

## 2011-11-09 ENCOUNTER — Ambulatory Visit (HOSPITAL_COMMUNITY)
Admission: RE | Admit: 2011-11-09 | Discharge: 2011-11-09 | Disposition: A | Discharge status: Home / Self Care | Payer: Medicare Other | Source: Ambulatory Visit | Attending: Orthopaedic Surgery | Admitting: Orthopaedic Surgery

## 2011-11-09 ENCOUNTER — Encounter (HOSPITAL_COMMUNITY): Payer: Self-pay | Admitting: Anesthesiology

## 2011-11-09 ENCOUNTER — Encounter (HOSPITAL_COMMUNITY)
Admission: RE | Disposition: A | Discharge status: Home / Self Care | Payer: Self-pay | Source: Ambulatory Visit | Attending: Orthopaedic Surgery

## 2011-11-09 DIAGNOSIS — L03039 Cellulitis of unspecified toe: Secondary | ICD-10-CM | POA: Diagnosis not present

## 2011-11-09 DIAGNOSIS — M9080 Osteopathy in diseases classified elsewhere, unspecified site: Secondary | ICD-10-CM | POA: Insufficient documentation

## 2011-11-09 DIAGNOSIS — M86679 Other chronic osteomyelitis, unspecified ankle and foot: Secondary | ICD-10-CM

## 2011-11-09 DIAGNOSIS — I96 Gangrene, not elsewhere classified: Secondary | ICD-10-CM | POA: Diagnosis not present

## 2011-11-09 DIAGNOSIS — Z7901 Long term (current) use of anticoagulants: Secondary | ICD-10-CM | POA: Insufficient documentation

## 2011-11-09 DIAGNOSIS — E1169 Type 2 diabetes mellitus with other specified complication: Secondary | ICD-10-CM | POA: Insufficient documentation

## 2011-11-09 DIAGNOSIS — E039 Hypothyroidism, unspecified: Secondary | ICD-10-CM | POA: Insufficient documentation

## 2011-11-09 DIAGNOSIS — E1159 Type 2 diabetes mellitus with other circulatory complications: Secondary | ICD-10-CM | POA: Diagnosis not present

## 2011-11-09 DIAGNOSIS — J449 Chronic obstructive pulmonary disease, unspecified: Secondary | ICD-10-CM | POA: Diagnosis not present

## 2011-11-09 DIAGNOSIS — J189 Pneumonia, unspecified organism: Secondary | ICD-10-CM | POA: Diagnosis not present

## 2011-11-09 DIAGNOSIS — L02619 Cutaneous abscess of unspecified foot: Secondary | ICD-10-CM | POA: Diagnosis not present

## 2011-11-09 DIAGNOSIS — I10 Essential (primary) hypertension: Secondary | ICD-10-CM | POA: Diagnosis not present

## 2011-11-09 DIAGNOSIS — M869 Osteomyelitis, unspecified: Secondary | ICD-10-CM | POA: Diagnosis not present

## 2011-11-09 DIAGNOSIS — J4489 Other specified chronic obstructive pulmonary disease: Secondary | ICD-10-CM | POA: Insufficient documentation

## 2011-11-09 DIAGNOSIS — Z01812 Encounter for preprocedural laboratory examination: Secondary | ICD-10-CM | POA: Insufficient documentation

## 2011-11-09 DIAGNOSIS — I4891 Unspecified atrial fibrillation: Secondary | ICD-10-CM | POA: Insufficient documentation

## 2011-11-09 DIAGNOSIS — G8918 Other acute postprocedural pain: Secondary | ICD-10-CM | POA: Diagnosis not present

## 2011-11-09 DIAGNOSIS — E7800 Pure hypercholesterolemia, unspecified: Secondary | ICD-10-CM | POA: Insufficient documentation

## 2011-11-09 HISTORY — PX: AMPUTATION: SHX166

## 2011-11-09 LAB — GLUCOSE, CAPILLARY: Glucose-Capillary: 124 mg/dL — ABNORMAL HIGH (ref 70–99)

## 2011-11-09 SURGERY — AMPUTATION DIGIT
Anesthesia: Monitor Anesthesia Care | Laterality: Right | Wound class: Dirty or Infected

## 2011-11-09 MED ORDER — 0.9 % SODIUM CHLORIDE (POUR BTL) OPTIME
TOPICAL | Status: DC | PRN
  Administered 2011-11-09: 1000 mL

## 2011-11-09 MED ORDER — LACTATED RINGERS IV SOLN
INTRAVENOUS | Status: DC | PRN
  Administered 2011-11-09: 11:00:00 via INTRAVENOUS

## 2011-11-09 MED ORDER — MIDAZOLAM HCL 2 MG/2ML IJ SOLN: 1.0000 mg | INTRAMUSCULAR | Status: DC | PRN

## 2011-11-09 MED ORDER — FENTANYL CITRATE 0.05 MG/ML IJ SOLN: 25.0000 ug | INTRAMUSCULAR | Status: DC | PRN

## 2011-11-09 MED ORDER — FENTANYL CITRATE 0.05 MG/ML IJ SOLN
INTRAMUSCULAR | Status: AC
  Filled 2011-11-09: qty 2

## 2011-11-09 MED ORDER — PROPOFOL 10 MG/ML IV EMUL
INTRAVENOUS | Status: DC | PRN
  Administered 2011-11-09: 50 ug/kg/min via INTRAVENOUS

## 2011-11-09 MED ORDER — BUPIVACAINE HCL (PF) 0.5 % IJ SOLN
INTRAMUSCULAR | Status: DC | PRN
  Administered 2011-11-09: 35 mL

## 2011-11-09 MED ORDER — LACTATED RINGERS IV SOLN
INTRAVENOUS | Status: DC
  Administered 2011-11-09: 11:00:00 via INTRAVENOUS

## 2011-11-09 MED ORDER — FENTANYL CITRATE 0.05 MG/ML IJ SOLN
50.0000 ug | INTRAMUSCULAR | Status: DC | PRN
  Administered 2011-11-09: 50 ug via INTRAVENOUS

## 2011-11-09 MED ORDER — LACTATED RINGERS IV SOLN: INTRAVENOUS | Status: DC

## 2011-11-09 MED ORDER — BUPIVACAINE HCL (PF) 0.25 % IJ SOLN
INTRAMUSCULAR | Status: DC | PRN
  Administered 2011-11-09: 5 mL

## 2011-11-09 MED ORDER — HYDROCODONE-ACETAMINOPHEN 5-325 MG PO TABS: 1.0000 | ORAL_TABLET | Freq: Four times a day (QID) | ORAL | Status: AC | PRN

## 2011-11-09 SURGICAL SUPPLY — 53 items
BANDAGE GAUZE 4  KLING STR (GAUZE/BANDAGES/DRESSINGS) IMPLANT
BANDAGE GAUZE ELAST BULKY 4 IN (GAUZE/BANDAGES/DRESSINGS) ×1 IMPLANT
BLADE AVERAGE 25X9 (BLADE) IMPLANT
BLADE MINI RND TIP GREEN BEAV (BLADE) IMPLANT
BNDG CMPR 9X4 STRL LF SNTH (GAUZE/BANDAGES/DRESSINGS) ×1
BNDG COHESIVE 1X5 TAN STRL LF (GAUZE/BANDAGES/DRESSINGS) IMPLANT
BNDG COHESIVE 4X5 TAN STRL (GAUZE/BANDAGES/DRESSINGS) ×1 IMPLANT
BNDG COHESIVE 6X5 TAN STRL LF (GAUZE/BANDAGES/DRESSINGS) IMPLANT
BNDG ESMARK 4X9 LF (GAUZE/BANDAGES/DRESSINGS) ×2 IMPLANT
BNDG GAUZE STRTCH 6 (GAUZE/BANDAGES/DRESSINGS) IMPLANT
CLOTH BEACON ORANGE TIMEOUT ST (SAFETY) ×2 IMPLANT
CORDS BIPOLAR (ELECTRODE) ×2 IMPLANT
COVER SURGICAL LIGHT HANDLE (MISCELLANEOUS) ×2 IMPLANT
CUFF TOURNIQUET SINGLE 18IN (TOURNIQUET CUFF) IMPLANT
CUFF TOURNIQUET SINGLE 24IN (TOURNIQUET CUFF) IMPLANT
CUFF TOURNIQUET SINGLE 34IN LL (TOURNIQUET CUFF) IMPLANT
DRAPE U-SHAPE 47X51 STRL (DRAPES) ×2 IMPLANT
DURAPREP 26ML APPLICATOR (WOUND CARE) ×2 IMPLANT
ELECT REM PT RETURN 9FT ADLT (ELECTROSURGICAL) ×2
ELECTRODE REM PT RTRN 9FT ADLT (ELECTROSURGICAL) ×1 IMPLANT
GAUZE SPONGE 2X2 8PLY STRL LF (GAUZE/BANDAGES/DRESSINGS) IMPLANT
GAUZE XEROFORM 1X8 LF (GAUZE/BANDAGES/DRESSINGS) ×2 IMPLANT
GLOVE BIOGEL PI IND STRL 6.5 (GLOVE) IMPLANT
GLOVE BIOGEL PI IND STRL 7.5 (GLOVE) IMPLANT
GLOVE BIOGEL PI IND STRL 8 (GLOVE) ×1 IMPLANT
GLOVE BIOGEL PI INDICATOR 6.5 (GLOVE) ×2
GLOVE BIOGEL PI INDICATOR 7.5 (GLOVE) ×1
GLOVE BIOGEL PI INDICATOR 8 (GLOVE) ×1
GLOVE ORTHO TXT STRL SZ7.5 (GLOVE) ×2 IMPLANT
GLOVE SURG SS PI 7.5 STRL IVOR (GLOVE) ×1 IMPLANT
GOWN PREVENTION PLUS LG XLONG (DISPOSABLE) IMPLANT
GOWN PREVENTION PLUS XLARGE (GOWN DISPOSABLE) ×2 IMPLANT
GOWN STRL NON-REIN LRG LVL3 (GOWN DISPOSABLE) ×3 IMPLANT
KIT BASIN OR (CUSTOM PROCEDURE TRAY) ×2 IMPLANT
KIT ROOM TURNOVER OR (KITS) ×2 IMPLANT
MANIFOLD NEPTUNE II (INSTRUMENTS) IMPLANT
NEEDLE HYPO 25GX1X1/2 BEV (NEEDLE) ×2 IMPLANT
NS IRRIG 1000ML POUR BTL (IV SOLUTION) ×2 IMPLANT
PACK ORTHO EXTREMITY (CUSTOM PROCEDURE TRAY) ×2 IMPLANT
PAD ARMBOARD 7.5X6 YLW CONV (MISCELLANEOUS) ×4 IMPLANT
PAD CAST 4YDX4 CTTN HI CHSV (CAST SUPPLIES) IMPLANT
PADDING CAST COTTON 4X4 STRL (CAST SUPPLIES)
SPECIMEN JAR SMALL (MISCELLANEOUS) ×2 IMPLANT
SPONGE GAUZE 2X2 STER 10/PKG (GAUZE/BANDAGES/DRESSINGS)
SPONGE GAUZE 4X4 12PLY (GAUZE/BANDAGES/DRESSINGS) ×1 IMPLANT
SUCTION FRAZIER TIP 10 FR DISP (SUCTIONS) ×2 IMPLANT
SUT ETHILON 2 0 FS 18 (SUTURE) IMPLANT
SUT VIC AB 2-0 FS1 27 (SUTURE) IMPLANT
SYR CONTROL 10ML LL (SYRINGE) ×2 IMPLANT
TOWEL OR 17X24 6PK STRL BLUE (TOWEL DISPOSABLE) ×2 IMPLANT
TOWEL OR 17X26 10 PK STRL BLUE (TOWEL DISPOSABLE) ×2 IMPLANT
TUBE CONNECTING 12X1/4 (SUCTIONS) IMPLANT
WATER STERILE IRR 1000ML POUR (IV SOLUTION) ×2 IMPLANT

## 2011-11-09 NOTE — H&P (Signed)
Sydney [name] is an 78 y.o. female.   Chief Complaint:   Right great toe with exposed bone; foul smell; obvious osteo HPI:   78 yo female diabetic with a right great toe chronic wound with exposed bone and obvious osteo.  She and her family understand that, given the clinical findings, an amputation of the right great toe is warranted.  She understands fully the risks and benefits involved.  She is on coumadin, but we have discussed keeping her on this due to the low risk of bleeding complications with this type of surgery.  Past Medical History  Diagnosis Date  . Chronic atrial fibrillation   . Lung disease     BRONCHOSPASTIC  . Neuropathy   . Hypertension   . Hypercholesterolemia   . Hypothyroidism   . Diabetes mellitus     NON-INSULIN DEPENDENT  . Anemia   . COPD (chronic obstructive pulmonary disease)   . Shortness of breath   . Pneumonia   . Angina     ATRIAL FIBB  . Blood transfusion 2012 ?APRIL    Past Surgical History  Procedure Date  . Total hip arthroplasty     LEFT HIP  . Appendectomy   . Back surgery   . Eye surgery     OU  . Fracture surgery 2011    LEFT HIP FX /REPLACEMENT   . Joint replacement     HIP LEFT   . Cholecystectomy 1951  . Tubal ligation   . Dilation and curettage of uterus   . Abdominal hysterectomy 1975    Family History  Problem Relation Age of Onset  . Heart disease Mother   . Stroke Mother   . Heart disease Father   . Stroke Father   . Heart attack Father   . Muscular dystrophy Brother   . Anesthesia problems Neg Hx   . Hypotension Neg Hx   . Malignant hyperthermia Neg Hx   . Pseudochol deficiency Neg Hx    Social History:  reports that she has never smoked. She has never used smokeless tobacco. She reports that she does not drink alcohol or use illicit drugs.  Allergies:  Allergies  Allergen Reactions  . Tramadol Anxiety  . Vicodin (Hydrocodone-Acetaminophen) Anxiety    Medications Prior to Admission  Medication Dose  Route Frequency Provider Last Rate Last Dose  . ceFAZolin (ANCEF) IVPB 1 g/50 mL premix  1 g Intravenous 60 min Pre-Op Kathryne Hitch, MD      . lactated ringers infusion   Intravenous Continuous Remonia Richter, MD 50 mL/hr at 11/09/11 1041     Medications Prior to Admission  Medication Sig Dispense Refill  . Ascorbic Acid (VITAMIN C PO) Take by mouth daily.        . Cholecalciferol (VITAMIN D3) 400 UNITS CAPS Take 1 tablet by mouth daily.        . Cyanocobalamin (VITAMIN B12 PO) Take by mouth daily.        Marland Kitchen FOLIC ACID PO Take 1 tablet by mouth daily.        . furosemide (LASIX) 20 MG tablet Take 20 mg by mouth daily.       Marland Kitchen gabapentin (NEURONTIN) 300 MG capsule Take 300 mg by mouth 2 (two) times daily.       Marland Kitchen glimepiride (AMARYL) 4 MG tablet Take 2 mg by mouth daily before breakfast.       . levothyroxine (SYNTHROID, LEVOTHROID) 50 MCG tablet Take 50 mcg by mouth  daily.       . lisinopril-hydrochlorothiazide (PRINZIDE,ZESTORETIC) 20-25 MG per tablet Take 1 tablet by mouth daily.        . metFORMIN (GLUCOPHAGE) 500 MG tablet Take 500 mg by mouth 2 (two) times daily with a meal.        . tiotropium (SPIRIVA) 18 MCG inhalation capsule Place 18 mcg into inhaler and inhale daily.          Results for orders placed during the hospital encounter of 11/09/11 (from the past 48 hour(s))  GLUCOSE, CAPILLARY     Status: Abnormal   Collection Time   11/09/11  8:54 AM      Component Value Range Comment   Glucose-Capillary 133 (*) 70 - 99 (mg/dL)    No results found.  Review of Systems  All other systems reviewed and are negative.    Blood pressure 115/67, pulse 90, temperature 97.9 F (36.6 C), temperature source Oral, resp. rate 18, SpO2 100.00%. Physical Exam  Constitutional: She is oriented to person, place, and time. She appears well-developed and well-nourished.  HENT:  Head: Normocephalic.  Neck: Normal range of motion. Neck supple.  Cardiovascular: Normal rate and  regular rhythm.   Respiratory: Effort normal and breath sounds normal.  GI: Soft. Bowel sounds are normal.  Musculoskeletal:       Feet:  Neurological: She is alert and oriented to person, place, and time.  Skin: Skin is warm and dry.  Psychiatric: She has a normal mood and affect.     Assessment/Plan To the OR for an amputation of her right great toe due to chronic osteo.  Leif Loflin Y 11/09/2011, 11:08 AM

## 2011-11-09 NOTE — Op Note (Signed)
[name], Sydney [name]:  [id number]  MEDICAL RECORD NO.:  [id number]  LOCATION:  [facility]                         FACILITY:  [facility]  PHYSICIAN:  Vanita Panda. Magnus Ivan, M.D.DATE OF BIRTH:  1921/05/30  DATE OF PROCEDURE:  11/09/2011 DATE OF DISCHARGE:  11/09/2011                              OPERATIVE REPORT   PREOPERATIVE DIAGNOSIS:  Chronic osteomyelitis and chronic wound, right great toe.  POSTOPERATIVE DIAGNOSIS:  Chronic osteomyelitis and chronic wound, right great toe.  PROCEDURE:  Right great toe amputation.  SURGEON:  Vanita Panda. Magnus Ivan, MD  ANESTHESIA: 1. Regional right ankle block. 2. Right great toe digital block. 3. Mask ventilation IV sedation.  BLOOD LOSS:  Minimal.  COMPLICATIONS:  None.  INDICATIONS:  [name] is an 78 year old female with a chronic great toe wound with foul odor and exposed bone as brown consistent with osteomyelitis.  The family understands that she needs to have an amputation back to a level that would give her the best chance at healing and closure.  They do wish to proceed with an amputation.  PROCEDURE DESCRIPTION:  After informed consent was obtained, appropriate right great toe was marked.  Anesthesia obtained, regional ankle block. She was then brought to the operating room and placed supine on the operating table.  Her right foot was then prepped and draped from the toes to the mid shin with DuraPrep and sterile drapes including a sterile stockinette.  A time-out was called and she was identified as the correct patient, correct right great toe.  Mask ventilation IV sedation was obtained.  I then placed a supplemental digital block with 0.5% plain Sensorcaine.  I then placed a towel clip within the toe and used a #10 blade to take this off to the IP joint.  I then passed the toe off and backed up the bone with a rongeur to good bleeding bone.  I did use an Esmarch as a local tourniquet around the ankle,  but removed this quickly after removing the big toe.  Hemostasis was obtained.  I cleaned the wound copiously with normal saline solution and then closed the skin as a flap with interrupted 2-0 nylon suture.  Xeroform followed by well-padded sterile dressing was applied.  The patient was taken to the recovery room in stable condition.  All final counts were correct and there no complications noted.     Vanita Panda. Magnus Ivan, M.D.     CYB/MEDQ  D:  11/09/2011  T:  11/09/2011  Job:  [id number]

## 2011-11-09 NOTE — Anesthesia Preprocedure Evaluation (Addendum)
Anesthesia Evaluation  Patient identified by MRN, date of birth, ID band Patient awake    Reviewed: Allergy & Precautions, H&P , NPO status , Patient's Chart, lab work & pertinent test results  History of Anesthesia Complications Negative for: history of anesthetic complications  Airway Mallampati: II TM Distance: >3 FB Neck ROM: Full    Dental No notable dental hx. (+) Teeth Intact and Dental Advisory Given   Pulmonary COPD COPD inhaler, former smoker (remote smoking history) breath sounds clear to auscultation  Pulmonary exam normal       Cardiovascular hypertension, Pt. on medications + dysrhythmias (on coumadin) Atrial Fibrillation Rhythm:Irregular Rate:Normal  ECHO 2011:  Normal LVF, mild AI   Neuro/Psych  Neuromuscular disease (diabetic peripheral neuropathy) negative neurological ROS     GI/Hepatic negative GI ROS, Neg liver ROS,   Endo/Other  Diabetes mellitus- (glu 133), Type 2, Oral Hypoglycemic AgentsHypothyroidism (on replacement)   Renal/GU negative Renal ROS     Musculoskeletal   Abdominal   Peds  Hematology   Anesthesia Other Findings   Reproductive/Obstetrics                          Anesthesia Physical Anesthesia Plan  ASA: III  Anesthesia Plan: MAC and Regional   Post-op Pain Management:    Induction: Intravenous  Airway Management Planned: Simple Face Mask  Additional Equipment:   Intra-op Plan:   Post-operative Plan:   Informed Consent: I have reviewed the patients History and Physical, chart, labs and discussed the procedure including the risks, benefits and alternatives for the proposed anesthesia with the patient or authorized representative who has indicated his/her understanding and acceptance.   Dental advisory given  Plan Discussed with: CRNA and Surgeon  Anesthesia Plan Comments: (Plan routine monitors, ankle block with MAC)        Anesthesia  Quick Evaluation

## 2011-11-09 NOTE — Transfer of Care (Signed)
Immediate Anesthesia Transfer of Care Note  Patient: Sydney [name]  Procedure(s) Performed: Procedure(s) (LRB): AMPUTATION DIGIT (Right)  Patient Location: PACU  Anesthesia Type: MAC and Regional  Level of Consciousness: awake, alert , oriented and patient cooperative  Airway & Oxygen Therapy: Patient Spontanous Breathing  Post-op Assessment: Report given to PACU RN, Post -op Vital signs reviewed and stable and Patient moving all extremities X 4  Post vital signs: Reviewed and stable  Complications: No apparent anesthesia complications

## 2011-11-09 NOTE — Discharge Instructions (Signed)
Instructions Following General Anesthetic, Adult A nurse specialized in giving anesthesia (anesthetist) or a doctor specialized in giving anesthesia (anesthesiologist) gave you a medicine that made you sleep while a procedure was performed. For as long as 24 hours following this procedure, you may feel:  Dizzy.   Weak.   Drowsy.  AFTER THE PROCEDURE After surgery, you will be taken to the recovery area where a nurse will monitor your progress. You will be allowed to go home when you are awake, stable, taking fluids well, and without complications. For the first 24 hours following an anesthetic:  Have a responsible person with you.   Do not drive a car. If you are alone, do not take public transportation.   Do not drink alcohol.   Do not take medicine that has not been prescribed by your caregiver.   Do not sign important papers or make important decisions.   You may resume normal diet and activities as directed.   Change bandages (dressings) as directed.   Only take over-the-counter or prescription medicines for pain, discomfort, or fever as directed by your caregiver.  If you have questions or problems that seem related to the anesthetic, call the hospital and ask for the anesthetist or anesthesiologist on call. SEEK IMMEDIATE MEDICAL CARE IF:   You develop a rash.   You have difficulty breathing.   You have chest pain.   You develop any allergic problems.  Document Released: 11/06/2000 Document Revised: 07/20/2011 Document Reviewed: 06/17/2007 [employer] Patient Information 2012 [employer]. 

## 2011-11-09 NOTE — Brief Op Note (Signed)
11/09/2011  12:04 PM  PATIENT:  Sydney [name]  78 y.o. female  PRE-OPERATIVE DIAGNOSIS:  Right great toe osteomyelitis, chronic wound  POST-OPERATIVE DIAGNOSIS:  Right great toe osteomyelitis, chronic wound  PROCEDURE:  Procedure(s) (LRB): AMPUTATION DIGIT (Right)  SURGEON:  Surgeon(s) and Role:    * Kathryne Hitch, MD - Primary  PHYSICIAN ASSISTANT:   ASSISTANTS: none   ANESTHESIA:   local, regional and IV sedation  EBL:  Total I/O In: 300 [I.V.:300] Out: -   BLOOD ADMINISTERED:none  DRAINS: none   LOCAL MEDICATIONS USED:  MARCAINE     SPECIMEN:  No Specimen  DISPOSITION OF SPECIMEN:  N/A  COUNTS:  YES  TOURNIQUET:  * No tourniquets in log *  DICTATION: .Other Dictation: Dictation Number [id number]  PLAN OF CARE: Discharge to home after PACU  PATIENT DISPOSITION:  PACU - hemodynamically stable.   Delay start of Pharmacological VTE agent (>24hrs) due to surgical blood loss or risk of bleeding: not applicable

## 2011-11-09 NOTE — Anesthesia Procedure Notes (Signed)
Anesthesia Regional Block:  Ankle block  Pre-Anesthetic Checklist: ,, timeout performed, Correct Patient, Correct Site, Correct Laterality, Correct Procedure, Correct Position, site marked, Risks and benefits discussed,  Surgical consent,  Pre-op evaluation,  At surgeon's request and post-op pain management  Laterality: Right  Prep: chloraprep       Needles:  Injection technique: Single-shot      Needle Gauge: 25 and 25 G    Additional Needles:  Procedures: other  (infiltration) Ankle block Narrative:  Start time: 11/09/2011 11:20 AM End time: 11/09/2011 11:26 AM Injection made incrementally with aspirations every 5 mL.  Performed by: Personally  Anesthesiologist: Sandford Craze, MD  Additional Notes: Pt identified in Holding room.  Monitors applied. Working IV access confirmed. Sterile prep R ankle.  #25ga with infiltration around deep peroneal, superficial peroneal, saphenous, sural and posterior tibial nerves.  Total 35cc 0.5% Bupivacaine injected incrementally.  Patient asymptomatic, VSS, no heme aspirated, tolerated well.   Sandford Craze, MD

## 2011-11-09 NOTE — Anesthesia Postprocedure Evaluation (Signed)
  Anesthesia Post-op Note  Patient: Sydney [name]  Procedure(s) Performed: Procedure(s) (LRB): AMPUTATION DIGIT (Right)  Patient Location: PACU  Anesthesia Type: Regional  Level of Consciousness: awake, alert  and oriented  Airway and Oxygen Therapy: Patient Spontanous Breathing  Post-op Pain: none  Post-op Assessment: Post-op Vital signs reviewed, Patient's Cardiovascular Status Stable, Respiratory Function Stable, Patent Airway, No signs of Nausea or vomiting and Pain level controlled  Post-op Vital Signs: Reviewed and stable  Complications: No apparent anesthesia complications

## 2011-11-13 ENCOUNTER — Encounter (HOSPITAL_BASED_OUTPATIENT_CLINIC_OR_DEPARTMENT_OTHER): Payer: Medicare Other | Attending: Internal Medicine

## 2011-11-13 DIAGNOSIS — E1169 Type 2 diabetes mellitus with other specified complication: Secondary | ICD-10-CM | POA: Insufficient documentation

## 2011-11-13 DIAGNOSIS — L97509 Non-pressure chronic ulcer of other part of unspecified foot with unspecified severity: Secondary | ICD-10-CM | POA: Diagnosis not present

## 2011-11-13 LAB — GLUCOSE, CAPILLARY
Glucose-Capillary: 121 mg/dL — ABNORMAL HIGH (ref 70–99)
Glucose-Capillary: 106 mg/dL — ABNORMAL HIGH (ref 70–99)

## 2011-11-14 ENCOUNTER — Encounter (HOSPITAL_COMMUNITY): Payer: Self-pay | Admitting: Orthopaedic Surgery

## 2011-11-14 DIAGNOSIS — E1169 Type 2 diabetes mellitus with other specified complication: Secondary | ICD-10-CM | POA: Diagnosis not present

## 2011-11-14 DIAGNOSIS — L97509 Non-pressure chronic ulcer of other part of unspecified foot with unspecified severity: Secondary | ICD-10-CM | POA: Diagnosis not present

## 2011-11-14 LAB — GLUCOSE, CAPILLARY: Glucose-Capillary: 134 mg/dL — ABNORMAL HIGH (ref 70–99)

## 2011-11-15 DIAGNOSIS — L97509 Non-pressure chronic ulcer of other part of unspecified foot with unspecified severity: Secondary | ICD-10-CM | POA: Diagnosis not present

## 2011-11-15 DIAGNOSIS — E1169 Type 2 diabetes mellitus with other specified complication: Secondary | ICD-10-CM | POA: Diagnosis not present

## 2011-11-15 LAB — GLUCOSE, CAPILLARY
Glucose-Capillary: 192 mg/dL — ABNORMAL HIGH (ref 70–99)
Glucose-Capillary: 221 mg/dL — ABNORMAL HIGH (ref 70–99)

## 2011-11-16 DIAGNOSIS — L97509 Non-pressure chronic ulcer of other part of unspecified foot with unspecified severity: Secondary | ICD-10-CM | POA: Diagnosis not present

## 2011-11-16 DIAGNOSIS — E1169 Type 2 diabetes mellitus with other specified complication: Secondary | ICD-10-CM | POA: Diagnosis not present

## 2011-11-16 LAB — GLUCOSE, CAPILLARY: Glucose-Capillary: 176 mg/dL — ABNORMAL HIGH (ref 70–99)

## 2011-11-17 DIAGNOSIS — E1169 Type 2 diabetes mellitus with other specified complication: Secondary | ICD-10-CM | POA: Diagnosis not present

## 2011-11-17 DIAGNOSIS — L97509 Non-pressure chronic ulcer of other part of unspecified foot with unspecified severity: Secondary | ICD-10-CM | POA: Diagnosis not present

## 2011-11-17 LAB — GLUCOSE, CAPILLARY: Glucose-Capillary: 176 mg/dL — ABNORMAL HIGH (ref 70–99)

## 2011-11-20 DIAGNOSIS — E1169 Type 2 diabetes mellitus with other specified complication: Secondary | ICD-10-CM | POA: Diagnosis not present

## 2011-11-20 DIAGNOSIS — L97509 Non-pressure chronic ulcer of other part of unspecified foot with unspecified severity: Secondary | ICD-10-CM | POA: Diagnosis not present

## 2011-11-21 DIAGNOSIS — E1169 Type 2 diabetes mellitus with other specified complication: Secondary | ICD-10-CM | POA: Diagnosis not present

## 2011-11-21 DIAGNOSIS — L97509 Non-pressure chronic ulcer of other part of unspecified foot with unspecified severity: Secondary | ICD-10-CM | POA: Diagnosis not present

## 2011-11-21 LAB — GLUCOSE, CAPILLARY
Glucose-Capillary: 230 mg/dL — ABNORMAL HIGH (ref 70–99)
Glucose-Capillary: 194 mg/dL — ABNORMAL HIGH (ref 70–99)

## 2011-11-22 DIAGNOSIS — I4891 Unspecified atrial fibrillation: Secondary | ICD-10-CM | POA: Diagnosis not present

## 2011-11-22 DIAGNOSIS — E1169 Type 2 diabetes mellitus with other specified complication: Secondary | ICD-10-CM | POA: Diagnosis not present

## 2011-11-22 DIAGNOSIS — Z7901 Long term (current) use of anticoagulants: Secondary | ICD-10-CM | POA: Diagnosis not present

## 2011-11-22 DIAGNOSIS — L97509 Non-pressure chronic ulcer of other part of unspecified foot with unspecified severity: Secondary | ICD-10-CM | POA: Diagnosis not present

## 2011-11-22 LAB — GLUCOSE, CAPILLARY
Glucose-Capillary: 217 mg/dL — ABNORMAL HIGH (ref 70–99)
Glucose-Capillary: 244 mg/dL — ABNORMAL HIGH (ref 70–99)

## 2011-11-23 DIAGNOSIS — E1169 Type 2 diabetes mellitus with other specified complication: Secondary | ICD-10-CM | POA: Diagnosis not present

## 2011-11-23 DIAGNOSIS — L97509 Non-pressure chronic ulcer of other part of unspecified foot with unspecified severity: Secondary | ICD-10-CM | POA: Diagnosis not present

## 2011-11-23 LAB — GLUCOSE, CAPILLARY
Glucose-Capillary: 268 mg/dL — ABNORMAL HIGH (ref 70–99)
Glucose-Capillary: 283 mg/dL — ABNORMAL HIGH (ref 70–99)

## 2011-11-24 DIAGNOSIS — L97509 Non-pressure chronic ulcer of other part of unspecified foot with unspecified severity: Secondary | ICD-10-CM | POA: Diagnosis not present

## 2011-11-24 DIAGNOSIS — E1169 Type 2 diabetes mellitus with other specified complication: Secondary | ICD-10-CM | POA: Diagnosis not present

## 2011-11-27 DIAGNOSIS — L97509 Non-pressure chronic ulcer of other part of unspecified foot with unspecified severity: Secondary | ICD-10-CM | POA: Diagnosis not present

## 2011-11-27 DIAGNOSIS — I509 Heart failure, unspecified: Secondary | ICD-10-CM | POA: Diagnosis not present

## 2011-11-27 DIAGNOSIS — E1159 Type 2 diabetes mellitus with other circulatory complications: Secondary | ICD-10-CM | POA: Diagnosis not present

## 2011-11-27 DIAGNOSIS — E1169 Type 2 diabetes mellitus with other specified complication: Secondary | ICD-10-CM | POA: Diagnosis not present

## 2011-11-27 DIAGNOSIS — R82998 Other abnormal findings in urine: Secondary | ICD-10-CM | POA: Diagnosis not present

## 2011-11-27 DIAGNOSIS — R310 Gross hematuria: Secondary | ICD-10-CM | POA: Diagnosis not present

## 2011-11-27 DIAGNOSIS — L97409 Non-pressure chronic ulcer of unspecified heel and midfoot with unspecified severity: Secondary | ICD-10-CM | POA: Diagnosis not present

## 2011-11-28 DIAGNOSIS — E1169 Type 2 diabetes mellitus with other specified complication: Secondary | ICD-10-CM | POA: Diagnosis not present

## 2011-11-28 DIAGNOSIS — L97509 Non-pressure chronic ulcer of other part of unspecified foot with unspecified severity: Secondary | ICD-10-CM | POA: Diagnosis not present

## 2011-11-28 LAB — GLUCOSE, CAPILLARY: Glucose-Capillary: 141 mg/dL — ABNORMAL HIGH (ref 70–99)

## 2011-11-29 DIAGNOSIS — L97509 Non-pressure chronic ulcer of other part of unspecified foot with unspecified severity: Secondary | ICD-10-CM | POA: Diagnosis not present

## 2011-11-29 DIAGNOSIS — E1169 Type 2 diabetes mellitus with other specified complication: Secondary | ICD-10-CM | POA: Diagnosis not present

## 2011-11-29 LAB — GLUCOSE, CAPILLARY: Glucose-Capillary: 149 mg/dL — ABNORMAL HIGH (ref 70–99)

## 2011-11-30 DIAGNOSIS — E1169 Type 2 diabetes mellitus with other specified complication: Secondary | ICD-10-CM | POA: Diagnosis not present

## 2011-11-30 DIAGNOSIS — L97509 Non-pressure chronic ulcer of other part of unspecified foot with unspecified severity: Secondary | ICD-10-CM | POA: Diagnosis not present

## 2011-11-30 LAB — GLUCOSE, CAPILLARY
Glucose-Capillary: 175 mg/dL — ABNORMAL HIGH (ref 70–99)
Glucose-Capillary: 219 mg/dL — ABNORMAL HIGH (ref 70–99)

## 2011-12-01 DIAGNOSIS — L97509 Non-pressure chronic ulcer of other part of unspecified foot with unspecified severity: Secondary | ICD-10-CM | POA: Diagnosis not present

## 2011-12-01 DIAGNOSIS — E1169 Type 2 diabetes mellitus with other specified complication: Secondary | ICD-10-CM | POA: Diagnosis not present

## 2011-12-01 DIAGNOSIS — R310 Gross hematuria: Secondary | ICD-10-CM | POA: Diagnosis not present

## 2011-12-01 DIAGNOSIS — R82998 Other abnormal findings in urine: Secondary | ICD-10-CM | POA: Diagnosis not present

## 2011-12-01 LAB — GLUCOSE, CAPILLARY: Glucose-Capillary: 161 mg/dL — ABNORMAL HIGH (ref 70–99)

## 2011-12-04 DIAGNOSIS — L97509 Non-pressure chronic ulcer of other part of unspecified foot with unspecified severity: Secondary | ICD-10-CM | POA: Diagnosis not present

## 2011-12-04 DIAGNOSIS — E1169 Type 2 diabetes mellitus with other specified complication: Secondary | ICD-10-CM | POA: Diagnosis not present

## 2011-12-08 DIAGNOSIS — R310 Gross hematuria: Secondary | ICD-10-CM | POA: Diagnosis not present

## 2011-12-11 DIAGNOSIS — L97509 Non-pressure chronic ulcer of other part of unspecified foot with unspecified severity: Secondary | ICD-10-CM | POA: Diagnosis not present

## 2011-12-11 DIAGNOSIS — E1169 Type 2 diabetes mellitus with other specified complication: Secondary | ICD-10-CM | POA: Diagnosis not present

## 2011-12-18 ENCOUNTER — Encounter (HOSPITAL_BASED_OUTPATIENT_CLINIC_OR_DEPARTMENT_OTHER): Payer: Medicare Other | Attending: Internal Medicine

## 2011-12-18 DIAGNOSIS — M86679 Other chronic osteomyelitis, unspecified ankle and foot: Secondary | ICD-10-CM | POA: Diagnosis not present

## 2011-12-18 DIAGNOSIS — X58XXXA Exposure to other specified factors, initial encounter: Secondary | ICD-10-CM | POA: Insufficient documentation

## 2011-12-18 DIAGNOSIS — S91109A Unspecified open wound of unspecified toe(s) without damage to nail, initial encounter: Secondary | ICD-10-CM | POA: Insufficient documentation

## 2011-12-20 DIAGNOSIS — Z7901 Long term (current) use of anticoagulants: Secondary | ICD-10-CM | POA: Diagnosis not present

## 2011-12-20 DIAGNOSIS — I4891 Unspecified atrial fibrillation: Secondary | ICD-10-CM | POA: Diagnosis not present

## 2011-12-25 ENCOUNTER — Ambulatory Visit (HOSPITAL_COMMUNITY)
Admission: RE | Admit: 2011-12-25 | Discharge: 2011-12-25 | Disposition: A | Discharge status: Home / Self Care | Payer: Medicare Other | Source: Ambulatory Visit | Attending: Internal Medicine | Admitting: Internal Medicine

## 2011-12-25 ENCOUNTER — Other Ambulatory Visit (HOSPITAL_BASED_OUTPATIENT_CLINIC_OR_DEPARTMENT_OTHER): Payer: Self-pay | Admitting: Internal Medicine

## 2011-12-25 DIAGNOSIS — M899 Disorder of bone, unspecified: Secondary | ICD-10-CM | POA: Diagnosis not present

## 2011-12-25 DIAGNOSIS — R52 Pain, unspecified: Secondary | ICD-10-CM

## 2011-12-25 DIAGNOSIS — M79609 Pain in unspecified limb: Secondary | ICD-10-CM | POA: Diagnosis not present

## 2011-12-25 DIAGNOSIS — M19079 Primary osteoarthritis, unspecified ankle and foot: Secondary | ICD-10-CM | POA: Diagnosis not present

## 2011-12-25 DIAGNOSIS — T8189XA Other complications of procedures, not elsewhere classified, initial encounter: Secondary | ICD-10-CM | POA: Diagnosis not present

## 2011-12-25 DIAGNOSIS — S91109A Unspecified open wound of unspecified toe(s) without damage to nail, initial encounter: Secondary | ICD-10-CM | POA: Diagnosis not present

## 2011-12-25 DIAGNOSIS — M949 Disorder of cartilage, unspecified: Secondary | ICD-10-CM | POA: Insufficient documentation

## 2011-12-25 DIAGNOSIS — M86679 Other chronic osteomyelitis, unspecified ankle and foot: Secondary | ICD-10-CM | POA: Diagnosis not present

## 2011-12-26 DIAGNOSIS — M069 Rheumatoid arthritis, unspecified: Secondary | ICD-10-CM | POA: Diagnosis not present

## 2011-12-29 ENCOUNTER — Other Ambulatory Visit (HOSPITAL_COMMUNITY): Payer: Self-pay | Admitting: Orthopaedic Surgery

## 2012-01-01 DIAGNOSIS — M86679 Other chronic osteomyelitis, unspecified ankle and foot: Secondary | ICD-10-CM | POA: Diagnosis not present

## 2012-01-01 DIAGNOSIS — S91109A Unspecified open wound of unspecified toe(s) without damage to nail, initial encounter: Secondary | ICD-10-CM | POA: Diagnosis not present

## 2012-01-02 ENCOUNTER — Encounter (HOSPITAL_COMMUNITY)
Admission: RE | Admit: 2012-01-02 | Discharge: 2012-01-02 | Disposition: A | Discharge status: Home / Self Care | Payer: Medicare Other | Source: Ambulatory Visit | Attending: Orthopaedic Surgery | Admitting: Orthopaedic Surgery

## 2012-01-02 ENCOUNTER — Encounter (HOSPITAL_COMMUNITY): Payer: Self-pay | Admitting: Pharmacy Technician

## 2012-01-02 ENCOUNTER — Encounter (HOSPITAL_COMMUNITY): Payer: Self-pay

## 2012-01-02 DIAGNOSIS — E7800 Pure hypercholesterolemia, unspecified: Secondary | ICD-10-CM | POA: Diagnosis not present

## 2012-01-02 DIAGNOSIS — J449 Chronic obstructive pulmonary disease, unspecified: Secondary | ICD-10-CM | POA: Diagnosis not present

## 2012-01-02 DIAGNOSIS — E119 Type 2 diabetes mellitus without complications: Secondary | ICD-10-CM | POA: Diagnosis not present

## 2012-01-02 DIAGNOSIS — M869 Osteomyelitis, unspecified: Secondary | ICD-10-CM | POA: Diagnosis not present

## 2012-01-02 DIAGNOSIS — E039 Hypothyroidism, unspecified: Secondary | ICD-10-CM | POA: Diagnosis not present

## 2012-01-02 DIAGNOSIS — I10 Essential (primary) hypertension: Secondary | ICD-10-CM | POA: Diagnosis not present

## 2012-01-02 DIAGNOSIS — L97509 Non-pressure chronic ulcer of other part of unspecified foot with unspecified severity: Secondary | ICD-10-CM | POA: Diagnosis not present

## 2012-01-02 DIAGNOSIS — Z01812 Encounter for preprocedural laboratory examination: Secondary | ICD-10-CM | POA: Diagnosis not present

## 2012-01-02 DIAGNOSIS — Z96649 Presence of unspecified artificial hip joint: Secondary | ICD-10-CM | POA: Diagnosis not present

## 2012-01-02 LAB — SURGICAL PCR SCREEN
MRSA, PCR: NEGATIVE
Staphylococcus aureus: NEGATIVE

## 2012-01-02 LAB — BASIC METABOLIC PANEL
BUN: 27 mg/dL — ABNORMAL HIGH (ref 6–23)
Chloride: 97 mEq/L (ref 96–112)
GFR calc Af Amer: 56 mL/min — ABNORMAL LOW (ref >90)
Potassium: 4.2 mEq/L (ref 3.5–5.1)
Sodium: 134 mEq/L — ABNORMAL LOW (ref 135–145)

## 2012-01-02 LAB — CBC
HCT: 30 % — ABNORMAL LOW (ref 36.0–46.0)
Hemoglobin: 10 g/dL — ABNORMAL LOW (ref 12.0–15.0)
RDW: 14.4 % (ref 11.5–15.5)
WBC: 6.5 10*3/uL (ref 4.0–10.5)

## 2012-01-02 SURGERY — AMPUTATION, FOOT, RAY

## 2012-01-02 NOTE — Pre-Procedure Instructions (Addendum)
01-02-12 EKG(07-31-11) , CXR(10-16-11) reports with chart. Anesthesia Record 11-09-11 with chart. Pt. Remains on Coumadin, was told per Dr. Magnus Ivan did not need to stop, will check PT/PTT AM of surgery per Dr. Council Mechanic.Marland Kitchen 01-02-12 Teach back method used with PST visit.

## 2012-01-02 NOTE — Patient Instructions (Addendum)
20 MASEY SCHEIBER  01/02/2012   Your procedure is scheduled on:  5-24 -2013  Report to [facility] at    0745    AM.  Call this number if you have problems the morning of surgery: [phone]   Remember:   Do not eat food:After Midnight.    Take these medicines the morning of surgery with A SIP OF WATER: Neurontin, Levothyroxine,  Use Spiriva   Do not wear jewelry, make-up or nail polish.  Do not wear lotions, powders, or perfumes. You may wear deodorant.  Do not shave 48 hours prior to surgery.(face and neck okay, no shaving of legs)  Do not bring valuables to the hospital.  Contacts, dentures or bridgework may not be worn into surgery.  Leave suitcase in the car. After surgery it may be brought to your room.  For patients admitted to the hospital, checkout time is 11:00 AM the day of discharge.   Patients discharged the day of surgery will not be allowed to drive home.  Name and phone number of your driver: Raynelle Dick, daughter  Special Instructions: CHG Shower Use Special Wash: 1/2 bottle night before surgery and 1/2 bottle morning of surgery.(avoid face and genitals)   Please read over the following fact sheets that you were given: MRSA Information,.

## 2012-01-03 DIAGNOSIS — Z7901 Long term (current) use of anticoagulants: Secondary | ICD-10-CM | POA: Diagnosis not present

## 2012-01-03 DIAGNOSIS — I4891 Unspecified atrial fibrillation: Secondary | ICD-10-CM | POA: Diagnosis not present

## 2012-01-05 ENCOUNTER — Encounter (HOSPITAL_COMMUNITY): Payer: Self-pay | Admitting: *Deleted

## 2012-01-05 ENCOUNTER — Encounter (HOSPITAL_COMMUNITY): Payer: Self-pay | Admitting: Anesthesiology

## 2012-01-05 ENCOUNTER — Ambulatory Visit (HOSPITAL_COMMUNITY): Payer: Medicare Other | Admitting: Anesthesiology

## 2012-01-05 ENCOUNTER — Encounter (HOSPITAL_COMMUNITY)
Admission: RE | Disposition: A | Discharge status: Home / Self Care | Payer: Self-pay | Source: Ambulatory Visit | Attending: Orthopaedic Surgery

## 2012-01-05 ENCOUNTER — Ambulatory Visit (HOSPITAL_COMMUNITY)
Admission: RE | Admit: 2012-01-05 | Discharge: 2012-01-05 | Disposition: A | Discharge status: Home / Self Care | Payer: Medicare Other | Source: Ambulatory Visit | Attending: Orthopaedic Surgery | Admitting: Orthopaedic Surgery

## 2012-01-05 ENCOUNTER — Encounter (HOSPITAL_COMMUNITY): Payer: Self-pay

## 2012-01-05 DIAGNOSIS — M86179 Other acute osteomyelitis, unspecified ankle and foot: Secondary | ICD-10-CM | POA: Diagnosis not present

## 2012-01-05 DIAGNOSIS — Z01812 Encounter for preprocedural laboratory examination: Secondary | ICD-10-CM | POA: Insufficient documentation

## 2012-01-05 DIAGNOSIS — L03039 Cellulitis of unspecified toe: Secondary | ICD-10-CM | POA: Diagnosis not present

## 2012-01-05 DIAGNOSIS — L02619 Cutaneous abscess of unspecified foot: Secondary | ICD-10-CM | POA: Diagnosis not present

## 2012-01-05 DIAGNOSIS — I10 Essential (primary) hypertension: Secondary | ICD-10-CM | POA: Diagnosis not present

## 2012-01-05 DIAGNOSIS — Z96649 Presence of unspecified artificial hip joint: Secondary | ICD-10-CM | POA: Insufficient documentation

## 2012-01-05 DIAGNOSIS — E119 Type 2 diabetes mellitus without complications: Secondary | ICD-10-CM | POA: Insufficient documentation

## 2012-01-05 DIAGNOSIS — L97509 Non-pressure chronic ulcer of other part of unspecified foot with unspecified severity: Secondary | ICD-10-CM | POA: Insufficient documentation

## 2012-01-05 DIAGNOSIS — J449 Chronic obstructive pulmonary disease, unspecified: Secondary | ICD-10-CM | POA: Insufficient documentation

## 2012-01-05 DIAGNOSIS — E7800 Pure hypercholesterolemia, unspecified: Secondary | ICD-10-CM | POA: Diagnosis not present

## 2012-01-05 DIAGNOSIS — M869 Osteomyelitis, unspecified: Secondary | ICD-10-CM | POA: Diagnosis not present

## 2012-01-05 DIAGNOSIS — E039 Hypothyroidism, unspecified: Secondary | ICD-10-CM | POA: Insufficient documentation

## 2012-01-05 DIAGNOSIS — M86679 Other chronic osteomyelitis, unspecified ankle and foot: Secondary | ICD-10-CM | POA: Diagnosis not present

## 2012-01-05 DIAGNOSIS — J4489 Other specified chronic obstructive pulmonary disease: Secondary | ICD-10-CM | POA: Insufficient documentation

## 2012-01-05 HISTORY — PX: AMPUTATION: SHX166

## 2012-01-05 LAB — GLUCOSE, CAPILLARY: Glucose-Capillary: 125 mg/dL — ABNORMAL HIGH (ref 70–99)

## 2012-01-05 LAB — PROTIME-INR: Prothrombin Time: 22 seconds — ABNORMAL HIGH (ref 11.6–15.2)

## 2012-01-05 LAB — APTT: aPTT: 53 seconds — ABNORMAL HIGH (ref 24–37)

## 2012-01-05 SURGERY — AMPUTATION, FOOT, RAY
Anesthesia: General | Site: Foot | Laterality: Left | Wound class: Dirty or Infected

## 2012-01-05 MED ORDER — 0.9 % SODIUM CHLORIDE (POUR BTL) OPTIME
TOPICAL | Status: DC | PRN
  Administered 2012-01-05: 1000 mL

## 2012-01-05 MED ORDER — GLYCOPYRROLATE 0.2 MG/ML IJ SOLN
INTRAMUSCULAR | Status: DC | PRN
  Administered 2012-01-05 (×2): .05 mg via INTRAVENOUS

## 2012-01-05 MED ORDER — LACTATED RINGERS IV SOLN
INTRAVENOUS | Status: DC | PRN
  Administered 2012-01-05: 10:00:00 via INTRAVENOUS

## 2012-01-05 MED ORDER — ONDANSETRON HCL 4 MG/2ML IJ SOLN
INTRAMUSCULAR | Status: DC | PRN
  Administered 2012-01-05: 4 mg via INTRAVENOUS

## 2012-01-05 MED ORDER — PROMETHAZINE HCL 25 MG/ML IJ SOLN: 6.2500 mg | INTRAMUSCULAR | Status: DC | PRN

## 2012-01-05 MED ORDER — ACETAMINOPHEN 10 MG/ML IV SOLN
INTRAVENOUS | Status: AC
  Filled 2012-01-05: qty 100

## 2012-01-05 MED ORDER — ACETAMINOPHEN 10 MG/ML IV SOLN
INTRAVENOUS | Status: DC | PRN
  Administered 2012-01-05: 1000 mg via INTRAVENOUS

## 2012-01-05 MED ORDER — FENTANYL CITRATE 0.05 MG/ML IJ SOLN
INTRAMUSCULAR | Status: DC | PRN
  Administered 2012-01-05 (×2): 50 ug via INTRAVENOUS

## 2012-01-05 MED ORDER — CEFAZOLIN SODIUM 1-5 GM-% IV SOLN
INTRAVENOUS | Status: AC
  Filled 2012-01-05: qty 50

## 2012-01-05 MED ORDER — FENTANYL CITRATE 0.05 MG/ML IJ SOLN: 25.0000 ug | INTRAMUSCULAR | Status: DC | PRN

## 2012-01-05 MED ORDER — PHENYLEPHRINE HCL 10 MG/ML IJ SOLN
INTRAMUSCULAR | Status: DC | PRN
  Administered 2012-01-05 (×2): 40 ug via INTRAVENOUS

## 2012-01-05 MED ORDER — KETAMINE HCL 10 MG/ML IJ SOLN
INTRAMUSCULAR | Status: DC | PRN
  Administered 2012-01-05 (×3): 5 mg via INTRAVENOUS

## 2012-01-05 MED ORDER — LACTATED RINGERS IV SOLN
INTRAVENOUS | Status: DC
  Administered 2012-01-05: 1000 mL via INTRAVENOUS

## 2012-01-05 MED ORDER — LACTATED RINGERS IV SOLN: INTRAVENOUS | Status: DC

## 2012-01-05 MED ORDER — CEFAZOLIN SODIUM 1-5 GM-% IV SOLN
1.0000 g | INTRAVENOUS | Status: AC
  Administered 2012-01-05: 1 g via INTRAVENOUS

## 2012-01-05 MED ORDER — LIDOCAINE HCL (CARDIAC) 20 MG/ML IV SOLN
INTRAVENOUS | Status: DC | PRN
  Administered 2012-01-05: 50 mg via INTRAVENOUS

## 2012-01-05 MED ORDER — PROPOFOL 10 MG/ML IV EMUL
INTRAVENOUS | Status: DC | PRN
  Administered 2012-01-05: 120 mg via INTRAVENOUS

## 2012-01-05 SURGICAL SUPPLY — 33 items
BAG SPEC THK2 15X12 ZIP CLS (MISCELLANEOUS) ×1
BAG ZIPLOCK 12X15 (MISCELLANEOUS) ×2 IMPLANT
BANDAGE ELASTIC 4 VELCRO ST LF (GAUZE/BANDAGES/DRESSINGS) ×1 IMPLANT
BANDAGE ESMARK 6X9 LF (GAUZE/BANDAGES/DRESSINGS) ×1 IMPLANT
BANDAGE GAUZE ELAST BULKY 4 IN (GAUZE/BANDAGES/DRESSINGS) ×1 IMPLANT
BNDG CMPR 9X6 STRL LF SNTH (GAUZE/BANDAGES/DRESSINGS) ×1
BNDG ESMARK 6X9 LF (GAUZE/BANDAGES/DRESSINGS) ×2
CLOTH BEACON ORANGE TIMEOUT ST (SAFETY) ×2 IMPLANT
CUFF TOURN SGL QUICK 34 (TOURNIQUET CUFF) ×2
CUFF TRNQT CYL 34X4X40X1 (TOURNIQUET CUFF) ×1 IMPLANT
DRAPE U-SHAPE 47X51 STRL (DRAPES) ×2 IMPLANT
DURAPREP 26ML APPLICATOR (WOUND CARE) ×2 IMPLANT
ELECT REM PT RETURN 9FT ADLT (ELECTROSURGICAL) ×2
ELECTRODE REM PT RTRN 9FT ADLT (ELECTROSURGICAL) ×1 IMPLANT
GAUZE XEROFORM 1X8 LF (GAUZE/BANDAGES/DRESSINGS) ×1 IMPLANT
GAUZE XEROFORM 5X9 LF (GAUZE/BANDAGES/DRESSINGS) ×1 IMPLANT
GLOVE ORTHO TXT STRL SZ7.5 (GLOVE) ×2 IMPLANT
GOWN STRL REIN XL XLG (GOWN DISPOSABLE) ×2 IMPLANT
KIT BASIN OR (CUSTOM PROCEDURE TRAY) ×2 IMPLANT
NS IRRIG 1000ML POUR BTL (IV SOLUTION) ×2 IMPLANT
PACK LOWER EXTREMITY WL (CUSTOM PROCEDURE TRAY) ×2 IMPLANT
PAD CAST 4YDX4 CTTN HI CHSV (CAST SUPPLIES) ×1 IMPLANT
PADDING CAST COTTON 4X4 STRL (CAST SUPPLIES) ×2
POSITIONER SURGICAL ARM (MISCELLANEOUS) ×2 IMPLANT
SPONGE GAUZE 4X4 12PLY (GAUZE/BANDAGES/DRESSINGS) ×2 IMPLANT
SPONGE LAP 18X18 X RAY DECT (DISPOSABLE) ×2 IMPLANT
STAPLER VISISTAT 35W (STAPLE) ×2 IMPLANT
STOCKINETTE 8 INCH (MISCELLANEOUS) ×2 IMPLANT
SUT ETHILON 2 0 PS N (SUTURE) ×2 IMPLANT
SUT ETHILON 2 0 PSLX (SUTURE) ×2 IMPLANT
SUT ETHILON 3 0 PS 1 (SUTURE) ×2 IMPLANT
TOWEL OR 17X26 10 PK STRL BLUE (TOWEL DISPOSABLE) ×2 IMPLANT
WATER STERILE IRR 1500ML POUR (IV SOLUTION) ×2 IMPLANT

## 2012-01-05 NOTE — Anesthesia Postprocedure Evaluation (Signed)
Anesthesia Post Note  Patient: Sydney [name]  Procedure(s) Performed: Procedure(s) (LRB): AMPUTATION RAY (Left)  Anesthesia type: General  Patient location: PACU  Post pain: Pain level controlled  Post assessment: Post-op Vital signs reviewed  Last Vitals:  Filed Vitals:   01/05/12 1115  BP: 126/56  Pulse: 81  Temp:   Resp: 14    Post vital signs: Reviewed  Level of consciousness: sedated  Complications: No apparent anesthesia complications

## 2012-01-05 NOTE — Anesthesia Preprocedure Evaluation (Addendum)
Anesthesia Evaluation  Patient identified by MRN, date of birth, ID band Patient awake    Reviewed: Allergy & Precautions, H&P , NPO status , Patient's Chart, lab work & pertinent test results  History of Anesthesia Complications Negative for: history of anesthetic complications  Airway Mallampati: II TM Distance: >3 FB Neck ROM: Full    Dental No notable dental hx. (+) Teeth Intact and Dental Advisory Given   Pulmonary shortness of breath, pneumonia , COPD COPD inhaler, former smoker (remote smoking history) breath sounds clear to auscultation  Pulmonary exam normal       Cardiovascular hypertension, Pt. on medications + angina + dysrhythmias Atrial Fibrillation Rhythm:Irregular Rate:Normal  ECHO 2011:  Normal LVF, mild AI   Neuro/Psych Peripheral neuropathy  Neuromuscular disease negative psych ROS   GI/Hepatic negative GI ROS, Neg liver ROS,   Endo/Other  Diabetes mellitus-, Type 2, Oral Hypoglycemic AgentsHypothyroidism   Renal/GU negative Renal ROS  negative genitourinary   Musculoskeletal negative musculoskeletal ROS (+)   Abdominal   Peds  Hematology negative hematology ROS (+)   Anesthesia Other Findings   Reproductive/Obstetrics negative OB ROS                           Anesthesia Physical Anesthesia Plan  ASA: III  Anesthesia Plan: General   Post-op Pain Management:    Induction:   Airway Management Planned: LMA  Additional Equipment:   Intra-op Plan:   Post-operative Plan: Extubation in OR  Informed Consent: I have reviewed the patients History and Physical, chart, labs and discussed the procedure including the risks, benefits and alternatives for the proposed anesthesia with the patient or authorized representative who has indicated his/her understanding and acceptance.   Dental advisory given  Plan Discussed with: CRNA  Anesthesia Plan Comments:         Anesthesia Quick Evaluation

## 2012-01-05 NOTE — Transfer of Care (Signed)
Immediate Anesthesia Transfer of Care Note  Patient: Sydney [name]  Procedure(s) Performed: Procedure(s) (LRB): AMPUTATION RAY (Left)  Patient Location: PACU  Anesthesia Type: General  Level of Consciousness: sedated and patient cooperative  Airway & Oxygen Therapy: Patient Spontanous Breathing and Patient connected to face mask oxygen  Post-op Assessment: Report given to PACU RN and Post -op Vital signs reviewed and stable  Post vital signs: Reviewed and stable  Complications: No apparent anesthesia complications

## 2012-01-05 NOTE — Anesthesia Procedure Notes (Signed)
Procedure Name: LMA Insertion Date/Time: 01/05/2012 10:30 AM Performed by: Randon Goldsmith CATHERINE PAYNE Pre-anesthesia Checklist: Patient identified, Emergency Drugs available, Suction available and Patient being monitored Patient Re-evaluated:Patient Re-evaluated prior to inductionOxygen Delivery Method: Circle system utilized Preoxygenation: Pre-oxygenation with 100% oxygen Intubation Type: IV induction Ventilation: Mask ventilation without difficulty LMA: LMA with gastric port inserted LMA Size: 4.0 Number of attempts: 1 Placement Confirmation: positive ETCO2 and breath sounds checked- equal and bilateral Tube secured with: Tape Dental Injury: Teeth and Oropharynx as per pre-operative assessment

## 2012-01-05 NOTE — H&P (Signed)
Sydney [name] is an 78 y.o. female.   Chief Complaint:   Chronic wounds with bone infection left 4th and 5th toes HPI:   78 yo female with chronic wounds on the 4th and 5th toes of her left foot.  Has failed multiple treatment efforts by the wound center inclusing hyperbaric oxygen.  Now presents for amputation of these toes due to non-healing wounds and chronic osteo.  Past Medical History  Diagnosis Date  . Chronic atrial fibrillation   . Lung disease     BRONCHOSPASTIC  . Neuropathy   . Hypertension   . Hypercholesterolemia   . Hypothyroidism   . Diabetes mellitus     NON-INSULIN DEPENDENT  . Anemia   . COPD (chronic obstructive pulmonary disease)   . Shortness of breath   . Pneumonia   . Angina     ATRIAL FIBB  . Blood transfusion 2012 ?APRIL    Past Surgical History  Procedure Date  . Total hip arthroplasty     LEFT HIP  . Appendectomy   . Back surgery   . Fracture surgery 2011    LEFT HIP FX /REPLACEMENT   . Joint replacement     HIP LEFT   . Cholecystectomy 1951  . Tubal ligation   . Dilation and curettage of uterus   . Abdominal hysterectomy 1975  . Amputation 11/09/2011    Procedure: AMPUTATION DIGIT;  Surgeon: Kathryne Hitch, MD;  Location: [facility] OR;  Service: Orthopedics;  Laterality: Right;  Right Great Toe Amputation  . Eye surgery     bilateral -cataract surgery    Family History  Problem Relation Age of Onset  . Heart disease Mother   . Stroke Mother   . Heart disease Father   . Stroke Father   . Heart attack Father   . Muscular dystrophy Brother   . Anesthesia problems Neg Hx   . Hypotension Neg Hx   . Malignant hyperthermia Neg Hx   . Pseudochol deficiency Neg Hx    Social History:  reports that she has quit smoking. Her smoking use included Cigarettes. She quit smokeless tobacco use about 31 years ago. She reports that she does not drink alcohol or use illicit drugs.  Allergies:  Allergies  Allergen Reactions  . Other    Narcotics=makes her very disoriented  . Tramadol Anxiety  . Vicodin (Hydrocodone-Acetaminophen) Anxiety    No prescriptions prior to admission    No results found for this or any previous visit (from the past 48 hour(s)). No results found.  Review of Systems  All other systems reviewed and are negative.    There were no vitals taken for this visit. Physical Exam  Constitutional: She is oriented to person, place, and time. She appears well-developed and well-nourished.  HENT:  Head: Normocephalic and atraumatic.  Eyes: EOM are normal. Pupils are equal, round, and reactive to light.  Neck: Normal range of motion. Neck supple.  Cardiovascular: Normal rate and regular rhythm.   Respiratory: Effort normal and breath sounds normal.  GI: Soft. Bowel sounds are normal.  Musculoskeletal:       Feet:  Neurological: She is alert and oriented to person, place, and time.  Skin: Skin is warm and dry.  Psychiatric: She has a normal mood and affect.     Assessment/Plan To the OR today for amputations of her left 4th and 5th toes.  Sydney [name] Y 01/05/2012, 7:29 AM

## 2012-01-05 NOTE — Preoperative (Signed)
Beta Blockers   Reason not to administer Beta Blockers:Not Applicable, pt not on home bb 

## 2012-01-05 NOTE — Progress Notes (Signed)
Pt eating crackers and drinking fluids without difficulty.

## 2012-01-05 NOTE — Brief Op Note (Signed)
01/05/2012  11:07 AM  PATIENT:  Sydney [name]  78 y.o. female  PRE-OPERATIVE DIAGNOSIS:  chronic wounds left foot 4th and 5th toes  POST-OPERATIVE DIAGNOSIS:  chronic wounds left foot 4th and 5th toes  PROCEDURE:  Procedure(s) (LRB): AMPUTATION RAY (Left)  SURGEON:  Surgeon(s) and Role:    * Kathryne Hitch, MD - Primary  PHYSICIAN ASSISTANT:   ASSISTANTS: none   ANESTHESIA:   general  EBL:   minimal  BLOOD ADMINISTERED:none  DRAINS: none   LOCAL MEDICATIONS USED:  NONE  SPECIMEN:  No Specimen  DISPOSITION OF SPECIMEN:  N/A  COUNTS:  YES  TOURNIQUET:  * No tourniquets in log *  DICTATION: .Other Dictation: Dictation Number [id number]  PLAN OF CARE: Discharge to home after PACU  PATIENT DISPOSITION:  PACU - hemodynamically stable.   Delay start of Pharmacological VTE agent (>24hrs) due to surgical blood loss or risk of bleeding: not applicable

## 2012-01-06 NOTE — Op Note (Signed)
NAMEMIKENZI, RAYSOR NO.:  [id number]  MEDICAL RECORD NO.:  [id number]  LOCATION:  [facility]                         FACILITY:  [facility]  PHYSICIAN:  Vanita Panda. Magnus Ivan, M.D.DATE OF BIRTH:  1921/01/10  DATE OF PROCEDURE: DATE OF DISCHARGE:  01/05/2012                              OPERATIVE REPORT   PREOPERATIVE DIAGNOSIS:  Left fourth and fifth toe chronic wounds with known osteomyelitis of the fourth toe.  POSTOP DIAGNOSIS:  Left fourth and fifth toe chronic wounds with known osteomyelitis of the fourth toe.  PROCEDURE:  Amputation of left fourth and fifth toes through the proximal phalanx near the metatarsophalangeal joints.  SURGEON:  Vanita Panda. Magnus Ivan, M.D.  ANESTHESIA:  General.  BLOOD LOSS:  Less than 50 mL.  COMPLICATIONS:  None.  INDICATIONS:  Ms. Wanat is an 78 year old female with chronic wounds on her left 4th and 5th toes.  She has been through several courses of treatment at the Wound Center including hyperbaric oxygen.  She just cannot heal these wounds.  She was recommended to undergo an amputation of the 4th and 5th toes due to the nature of the wounds __________ exposed bones and there is definitely osteomyelitis in the 4th toe.  I talked to her and her family at length about this and they do wish to have her toes removed.  DESCRIPTION OF PROCEDURE:  After informed consent was obtained, appropriate left foot was marked.  She was brought to the operating room and placed supine on the operating table.  General anesthesia was then obtained.  The left foot was prepped and draped with DuraPrep and sterile drapes.  A time-out was called to identify the correct patient and correct left foot.  I then used a towel around the ankle with an Esmarch as a local tourniquet.  I used a #15 blade, and first I ellipsed out the 4th toe and removed this in its entirety using a bone cutter and rongeur, smoothed this back to a stable margin.  I used  electrocautery for coagulations.  I then did the same thing with the 5th toe.  I ellipsed this out as well and used a bone cutter and a rongeur to remove it in its entirety.  I used electrocautery to coagulate the vessels, and then copiously irrigated the tissues with normal saline solution.  Let the Esmarch off the ankle.  Hemostasis was obtained and I closed the 4th toe with interrupted 3-0 nylon, and the 5th toe with interrupted 2-0 nylon.  Xeroform followed by a well-padded sterile dressing was applied.  The patient was awakened, extubated, and taken to the recovery room in stable condition.  Postoperatively, allowed her to weightbear as tolerated in a postoperative shoe.  We will see her back in the office in 2 weeks.     Vanita Panda. Magnus Ivan, M.D.     CYB/MEDQ  D:  01/05/2012  T:  01/06/2012  Job:  [id number]

## 2012-01-10 ENCOUNTER — Encounter (HOSPITAL_COMMUNITY): Payer: Self-pay | Admitting: Orthopaedic Surgery

## 2012-01-15 ENCOUNTER — Encounter (HOSPITAL_BASED_OUTPATIENT_CLINIC_OR_DEPARTMENT_OTHER): Payer: Medicare Other

## 2012-01-25 DIAGNOSIS — I4891 Unspecified atrial fibrillation: Secondary | ICD-10-CM | POA: Diagnosis not present

## 2012-01-25 DIAGNOSIS — Z7901 Long term (current) use of anticoagulants: Secondary | ICD-10-CM | POA: Diagnosis not present

## 2012-02-01 DIAGNOSIS — I4891 Unspecified atrial fibrillation: Secondary | ICD-10-CM | POA: Diagnosis not present

## 2012-02-01 DIAGNOSIS — Z7901 Long term (current) use of anticoagulants: Secondary | ICD-10-CM | POA: Diagnosis not present

## 2012-02-12 DIAGNOSIS — L03039 Cellulitis of unspecified toe: Secondary | ICD-10-CM | POA: Diagnosis not present

## 2012-02-12 DIAGNOSIS — I70269 Atherosclerosis of native arteries of extremities with gangrene, unspecified extremity: Secondary | ICD-10-CM | POA: Diagnosis not present

## 2012-02-12 DIAGNOSIS — M86679 Other chronic osteomyelitis, unspecified ankle and foot: Secondary | ICD-10-CM | POA: Diagnosis not present

## 2012-02-14 DIAGNOSIS — Z7901 Long term (current) use of anticoagulants: Secondary | ICD-10-CM | POA: Diagnosis not present

## 2012-02-14 DIAGNOSIS — M069 Rheumatoid arthritis, unspecified: Secondary | ICD-10-CM | POA: Diagnosis not present

## 2012-02-14 DIAGNOSIS — I4891 Unspecified atrial fibrillation: Secondary | ICD-10-CM | POA: Diagnosis not present

## 2012-02-26 DIAGNOSIS — L03039 Cellulitis of unspecified toe: Secondary | ICD-10-CM | POA: Diagnosis not present

## 2012-02-26 DIAGNOSIS — I70269 Atherosclerosis of native arteries of extremities with gangrene, unspecified extremity: Secondary | ICD-10-CM | POA: Diagnosis not present

## 2012-02-26 DIAGNOSIS — L02619 Cutaneous abscess of unspecified foot: Secondary | ICD-10-CM | POA: Diagnosis not present

## 2012-02-26 DIAGNOSIS — S98139A Complete traumatic amputation of one unspecified lesser toe, initial encounter: Secondary | ICD-10-CM | POA: Diagnosis not present

## 2012-02-26 DIAGNOSIS — M86679 Other chronic osteomyelitis, unspecified ankle and foot: Secondary | ICD-10-CM | POA: Diagnosis not present

## 2012-02-28 DIAGNOSIS — Z7901 Long term (current) use of anticoagulants: Secondary | ICD-10-CM | POA: Diagnosis not present

## 2012-02-28 DIAGNOSIS — I4891 Unspecified atrial fibrillation: Secondary | ICD-10-CM | POA: Diagnosis not present

## 2012-02-29 DIAGNOSIS — L97509 Non-pressure chronic ulcer of other part of unspecified foot with unspecified severity: Secondary | ICD-10-CM | POA: Diagnosis not present

## 2012-02-29 DIAGNOSIS — I10 Essential (primary) hypertension: Secondary | ICD-10-CM | POA: Diagnosis not present

## 2012-02-29 DIAGNOSIS — E1169 Type 2 diabetes mellitus with other specified complication: Secondary | ICD-10-CM | POA: Diagnosis not present

## 2012-02-29 DIAGNOSIS — J441 Chronic obstructive pulmonary disease with (acute) exacerbation: Secondary | ICD-10-CM | POA: Diagnosis not present

## 2012-02-29 DIAGNOSIS — M069 Rheumatoid arthritis, unspecified: Secondary | ICD-10-CM | POA: Diagnosis not present

## 2012-03-04 DIAGNOSIS — M069 Rheumatoid arthritis, unspecified: Secondary | ICD-10-CM | POA: Diagnosis not present

## 2012-03-04 DIAGNOSIS — E1169 Type 2 diabetes mellitus with other specified complication: Secondary | ICD-10-CM | POA: Diagnosis not present

## 2012-03-04 DIAGNOSIS — I10 Essential (primary) hypertension: Secondary | ICD-10-CM | POA: Diagnosis not present

## 2012-03-04 DIAGNOSIS — L97509 Non-pressure chronic ulcer of other part of unspecified foot with unspecified severity: Secondary | ICD-10-CM | POA: Diagnosis not present

## 2012-03-04 DIAGNOSIS — J441 Chronic obstructive pulmonary disease with (acute) exacerbation: Secondary | ICD-10-CM | POA: Diagnosis not present

## 2012-03-06 DIAGNOSIS — J441 Chronic obstructive pulmonary disease with (acute) exacerbation: Secondary | ICD-10-CM | POA: Diagnosis not present

## 2012-03-06 DIAGNOSIS — E1169 Type 2 diabetes mellitus with other specified complication: Secondary | ICD-10-CM | POA: Diagnosis not present

## 2012-03-06 DIAGNOSIS — I10 Essential (primary) hypertension: Secondary | ICD-10-CM | POA: Diagnosis not present

## 2012-03-06 DIAGNOSIS — M069 Rheumatoid arthritis, unspecified: Secondary | ICD-10-CM | POA: Diagnosis not present

## 2012-03-06 DIAGNOSIS — L97509 Non-pressure chronic ulcer of other part of unspecified foot with unspecified severity: Secondary | ICD-10-CM | POA: Diagnosis not present

## 2012-03-07 DIAGNOSIS — M069 Rheumatoid arthritis, unspecified: Secondary | ICD-10-CM | POA: Diagnosis not present

## 2012-03-07 DIAGNOSIS — I10 Essential (primary) hypertension: Secondary | ICD-10-CM | POA: Diagnosis not present

## 2012-03-07 DIAGNOSIS — J441 Chronic obstructive pulmonary disease with (acute) exacerbation: Secondary | ICD-10-CM | POA: Diagnosis not present

## 2012-03-07 DIAGNOSIS — E1169 Type 2 diabetes mellitus with other specified complication: Secondary | ICD-10-CM | POA: Diagnosis not present

## 2012-03-07 DIAGNOSIS — L97509 Non-pressure chronic ulcer of other part of unspecified foot with unspecified severity: Secondary | ICD-10-CM | POA: Diagnosis not present

## 2012-03-08 DIAGNOSIS — E1169 Type 2 diabetes mellitus with other specified complication: Secondary | ICD-10-CM | POA: Diagnosis not present

## 2012-03-08 DIAGNOSIS — J441 Chronic obstructive pulmonary disease with (acute) exacerbation: Secondary | ICD-10-CM | POA: Diagnosis not present

## 2012-03-08 DIAGNOSIS — I10 Essential (primary) hypertension: Secondary | ICD-10-CM | POA: Diagnosis not present

## 2012-03-08 DIAGNOSIS — L97509 Non-pressure chronic ulcer of other part of unspecified foot with unspecified severity: Secondary | ICD-10-CM | POA: Diagnosis not present

## 2012-03-08 DIAGNOSIS — M069 Rheumatoid arthritis, unspecified: Secondary | ICD-10-CM | POA: Diagnosis not present

## 2012-03-12 DIAGNOSIS — E1169 Type 2 diabetes mellitus with other specified complication: Secondary | ICD-10-CM | POA: Diagnosis not present

## 2012-03-12 DIAGNOSIS — L97509 Non-pressure chronic ulcer of other part of unspecified foot with unspecified severity: Secondary | ICD-10-CM | POA: Diagnosis not present

## 2012-03-12 DIAGNOSIS — M069 Rheumatoid arthritis, unspecified: Secondary | ICD-10-CM | POA: Diagnosis not present

## 2012-03-12 DIAGNOSIS — J441 Chronic obstructive pulmonary disease with (acute) exacerbation: Secondary | ICD-10-CM | POA: Diagnosis not present

## 2012-03-12 DIAGNOSIS — I10 Essential (primary) hypertension: Secondary | ICD-10-CM | POA: Diagnosis not present

## 2012-03-14 DIAGNOSIS — J441 Chronic obstructive pulmonary disease with (acute) exacerbation: Secondary | ICD-10-CM | POA: Diagnosis not present

## 2012-03-14 DIAGNOSIS — L97509 Non-pressure chronic ulcer of other part of unspecified foot with unspecified severity: Secondary | ICD-10-CM | POA: Diagnosis not present

## 2012-03-14 DIAGNOSIS — I10 Essential (primary) hypertension: Secondary | ICD-10-CM | POA: Diagnosis not present

## 2012-03-14 DIAGNOSIS — M069 Rheumatoid arthritis, unspecified: Secondary | ICD-10-CM | POA: Diagnosis not present

## 2012-03-14 DIAGNOSIS — E1169 Type 2 diabetes mellitus with other specified complication: Secondary | ICD-10-CM | POA: Diagnosis not present

## 2012-03-15 DIAGNOSIS — I509 Heart failure, unspecified: Secondary | ICD-10-CM | POA: Diagnosis not present

## 2012-03-15 DIAGNOSIS — Z7901 Long term (current) use of anticoagulants: Secondary | ICD-10-CM | POA: Diagnosis not present

## 2012-03-15 DIAGNOSIS — E039 Hypothyroidism, unspecified: Secondary | ICD-10-CM | POA: Diagnosis not present

## 2012-03-15 DIAGNOSIS — J449 Chronic obstructive pulmonary disease, unspecified: Secondary | ICD-10-CM | POA: Diagnosis not present

## 2012-03-15 DIAGNOSIS — E1159 Type 2 diabetes mellitus with other circulatory complications: Secondary | ICD-10-CM | POA: Diagnosis not present

## 2012-03-19 DIAGNOSIS — I10 Essential (primary) hypertension: Secondary | ICD-10-CM | POA: Diagnosis not present

## 2012-03-19 DIAGNOSIS — J441 Chronic obstructive pulmonary disease with (acute) exacerbation: Secondary | ICD-10-CM | POA: Diagnosis not present

## 2012-03-19 DIAGNOSIS — L97509 Non-pressure chronic ulcer of other part of unspecified foot with unspecified severity: Secondary | ICD-10-CM | POA: Diagnosis not present

## 2012-03-19 DIAGNOSIS — E1169 Type 2 diabetes mellitus with other specified complication: Secondary | ICD-10-CM | POA: Diagnosis not present

## 2012-03-19 DIAGNOSIS — M069 Rheumatoid arthritis, unspecified: Secondary | ICD-10-CM | POA: Diagnosis not present

## 2012-03-20 DIAGNOSIS — L97509 Non-pressure chronic ulcer of other part of unspecified foot with unspecified severity: Secondary | ICD-10-CM | POA: Diagnosis not present

## 2012-03-20 DIAGNOSIS — M069 Rheumatoid arthritis, unspecified: Secondary | ICD-10-CM | POA: Diagnosis not present

## 2012-03-20 DIAGNOSIS — E1169 Type 2 diabetes mellitus with other specified complication: Secondary | ICD-10-CM | POA: Diagnosis not present

## 2012-03-20 DIAGNOSIS — J441 Chronic obstructive pulmonary disease with (acute) exacerbation: Secondary | ICD-10-CM | POA: Diagnosis not present

## 2012-03-20 DIAGNOSIS — I10 Essential (primary) hypertension: Secondary | ICD-10-CM | POA: Diagnosis not present

## 2012-03-21 ENCOUNTER — Telehealth: Payer: Self-pay | Admitting: Cardiology

## 2012-03-21 NOTE — Telephone Encounter (Signed)
New Problem:    I called the patient in an attempt to schedule an appointment and was told that the patient would have her daughter, Raynelle Dick, call back when she comes back into town because she is her transportation.  I left our scheduling number with the patient.

## 2012-03-22 DIAGNOSIS — L97509 Non-pressure chronic ulcer of other part of unspecified foot with unspecified severity: Secondary | ICD-10-CM | POA: Diagnosis not present

## 2012-03-22 DIAGNOSIS — M069 Rheumatoid arthritis, unspecified: Secondary | ICD-10-CM | POA: Diagnosis not present

## 2012-03-22 DIAGNOSIS — E1169 Type 2 diabetes mellitus with other specified complication: Secondary | ICD-10-CM | POA: Diagnosis not present

## 2012-03-22 DIAGNOSIS — J441 Chronic obstructive pulmonary disease with (acute) exacerbation: Secondary | ICD-10-CM | POA: Diagnosis not present

## 2012-03-22 DIAGNOSIS — I10 Essential (primary) hypertension: Secondary | ICD-10-CM | POA: Diagnosis not present

## 2012-03-26 DIAGNOSIS — L97509 Non-pressure chronic ulcer of other part of unspecified foot with unspecified severity: Secondary | ICD-10-CM | POA: Diagnosis not present

## 2012-03-26 DIAGNOSIS — I10 Essential (primary) hypertension: Secondary | ICD-10-CM | POA: Diagnosis not present

## 2012-03-26 DIAGNOSIS — M069 Rheumatoid arthritis, unspecified: Secondary | ICD-10-CM | POA: Diagnosis not present

## 2012-03-26 DIAGNOSIS — J441 Chronic obstructive pulmonary disease with (acute) exacerbation: Secondary | ICD-10-CM | POA: Diagnosis not present

## 2012-03-26 DIAGNOSIS — E1169 Type 2 diabetes mellitus with other specified complication: Secondary | ICD-10-CM | POA: Diagnosis not present

## 2012-03-27 DIAGNOSIS — M069 Rheumatoid arthritis, unspecified: Secondary | ICD-10-CM | POA: Diagnosis not present

## 2012-03-27 DIAGNOSIS — L97509 Non-pressure chronic ulcer of other part of unspecified foot with unspecified severity: Secondary | ICD-10-CM | POA: Diagnosis not present

## 2012-03-27 DIAGNOSIS — J441 Chronic obstructive pulmonary disease with (acute) exacerbation: Secondary | ICD-10-CM | POA: Diagnosis not present

## 2012-03-27 DIAGNOSIS — E1169 Type 2 diabetes mellitus with other specified complication: Secondary | ICD-10-CM | POA: Diagnosis not present

## 2012-03-27 DIAGNOSIS — I10 Essential (primary) hypertension: Secondary | ICD-10-CM | POA: Diagnosis not present

## 2012-03-29 DIAGNOSIS — I10 Essential (primary) hypertension: Secondary | ICD-10-CM | POA: Diagnosis not present

## 2012-03-29 DIAGNOSIS — E1169 Type 2 diabetes mellitus with other specified complication: Secondary | ICD-10-CM | POA: Diagnosis not present

## 2012-03-29 DIAGNOSIS — M069 Rheumatoid arthritis, unspecified: Secondary | ICD-10-CM | POA: Diagnosis not present

## 2012-03-29 DIAGNOSIS — L97509 Non-pressure chronic ulcer of other part of unspecified foot with unspecified severity: Secondary | ICD-10-CM | POA: Diagnosis not present

## 2012-03-29 DIAGNOSIS — J441 Chronic obstructive pulmonary disease with (acute) exacerbation: Secondary | ICD-10-CM | POA: Diagnosis not present

## 2012-04-02 DIAGNOSIS — J441 Chronic obstructive pulmonary disease with (acute) exacerbation: Secondary | ICD-10-CM | POA: Diagnosis not present

## 2012-04-02 DIAGNOSIS — E1169 Type 2 diabetes mellitus with other specified complication: Secondary | ICD-10-CM | POA: Diagnosis not present

## 2012-04-02 DIAGNOSIS — L97509 Non-pressure chronic ulcer of other part of unspecified foot with unspecified severity: Secondary | ICD-10-CM | POA: Diagnosis not present

## 2012-04-02 DIAGNOSIS — M069 Rheumatoid arthritis, unspecified: Secondary | ICD-10-CM | POA: Diagnosis not present

## 2012-04-02 DIAGNOSIS — I10 Essential (primary) hypertension: Secondary | ICD-10-CM | POA: Diagnosis not present

## 2012-04-04 DIAGNOSIS — L97509 Non-pressure chronic ulcer of other part of unspecified foot with unspecified severity: Secondary | ICD-10-CM | POA: Diagnosis not present

## 2012-04-04 DIAGNOSIS — M069 Rheumatoid arthritis, unspecified: Secondary | ICD-10-CM | POA: Diagnosis not present

## 2012-04-04 DIAGNOSIS — I10 Essential (primary) hypertension: Secondary | ICD-10-CM | POA: Diagnosis not present

## 2012-04-04 DIAGNOSIS — E1169 Type 2 diabetes mellitus with other specified complication: Secondary | ICD-10-CM | POA: Diagnosis not present

## 2012-04-04 DIAGNOSIS — J441 Chronic obstructive pulmonary disease with (acute) exacerbation: Secondary | ICD-10-CM | POA: Diagnosis not present

## 2012-04-08 DIAGNOSIS — M86679 Other chronic osteomyelitis, unspecified ankle and foot: Secondary | ICD-10-CM | POA: Diagnosis not present

## 2012-04-08 DIAGNOSIS — L97409 Non-pressure chronic ulcer of unspecified heel and midfoot with unspecified severity: Secondary | ICD-10-CM | POA: Diagnosis not present

## 2012-04-08 DIAGNOSIS — I70269 Atherosclerosis of native arteries of extremities with gangrene, unspecified extremity: Secondary | ICD-10-CM | POA: Diagnosis not present

## 2012-04-09 DIAGNOSIS — J441 Chronic obstructive pulmonary disease with (acute) exacerbation: Secondary | ICD-10-CM | POA: Diagnosis not present

## 2012-04-09 DIAGNOSIS — L97509 Non-pressure chronic ulcer of other part of unspecified foot with unspecified severity: Secondary | ICD-10-CM | POA: Diagnosis not present

## 2012-04-09 DIAGNOSIS — I10 Essential (primary) hypertension: Secondary | ICD-10-CM | POA: Diagnosis not present

## 2012-04-09 DIAGNOSIS — M069 Rheumatoid arthritis, unspecified: Secondary | ICD-10-CM | POA: Diagnosis not present

## 2012-04-09 DIAGNOSIS — E1169 Type 2 diabetes mellitus with other specified complication: Secondary | ICD-10-CM | POA: Diagnosis not present

## 2012-04-11 DIAGNOSIS — I10 Essential (primary) hypertension: Secondary | ICD-10-CM | POA: Diagnosis not present

## 2012-04-11 DIAGNOSIS — E1169 Type 2 diabetes mellitus with other specified complication: Secondary | ICD-10-CM | POA: Diagnosis not present

## 2012-04-11 DIAGNOSIS — L97509 Non-pressure chronic ulcer of other part of unspecified foot with unspecified severity: Secondary | ICD-10-CM | POA: Diagnosis not present

## 2012-04-11 DIAGNOSIS — M069 Rheumatoid arthritis, unspecified: Secondary | ICD-10-CM | POA: Diagnosis not present

## 2012-04-11 DIAGNOSIS — J441 Chronic obstructive pulmonary disease with (acute) exacerbation: Secondary | ICD-10-CM | POA: Diagnosis not present

## 2012-04-18 DIAGNOSIS — I10 Essential (primary) hypertension: Secondary | ICD-10-CM | POA: Diagnosis not present

## 2012-04-18 DIAGNOSIS — M069 Rheumatoid arthritis, unspecified: Secondary | ICD-10-CM | POA: Diagnosis not present

## 2012-04-18 DIAGNOSIS — J441 Chronic obstructive pulmonary disease with (acute) exacerbation: Secondary | ICD-10-CM | POA: Diagnosis not present

## 2012-04-18 DIAGNOSIS — E1169 Type 2 diabetes mellitus with other specified complication: Secondary | ICD-10-CM | POA: Diagnosis not present

## 2012-04-18 DIAGNOSIS — L97509 Non-pressure chronic ulcer of other part of unspecified foot with unspecified severity: Secondary | ICD-10-CM | POA: Diagnosis not present

## 2012-04-23 DIAGNOSIS — M069 Rheumatoid arthritis, unspecified: Secondary | ICD-10-CM | POA: Diagnosis not present

## 2012-04-23 DIAGNOSIS — J441 Chronic obstructive pulmonary disease with (acute) exacerbation: Secondary | ICD-10-CM | POA: Diagnosis not present

## 2012-04-23 DIAGNOSIS — I10 Essential (primary) hypertension: Secondary | ICD-10-CM | POA: Diagnosis not present

## 2012-04-23 DIAGNOSIS — L97509 Non-pressure chronic ulcer of other part of unspecified foot with unspecified severity: Secondary | ICD-10-CM | POA: Diagnosis not present

## 2012-04-23 DIAGNOSIS — E1169 Type 2 diabetes mellitus with other specified complication: Secondary | ICD-10-CM | POA: Diagnosis not present

## 2012-04-25 DIAGNOSIS — L97509 Non-pressure chronic ulcer of other part of unspecified foot with unspecified severity: Secondary | ICD-10-CM | POA: Diagnosis not present

## 2012-04-25 DIAGNOSIS — I10 Essential (primary) hypertension: Secondary | ICD-10-CM | POA: Diagnosis not present

## 2012-04-25 DIAGNOSIS — E1169 Type 2 diabetes mellitus with other specified complication: Secondary | ICD-10-CM | POA: Diagnosis not present

## 2012-04-25 DIAGNOSIS — M069 Rheumatoid arthritis, unspecified: Secondary | ICD-10-CM | POA: Diagnosis not present

## 2012-04-25 DIAGNOSIS — J441 Chronic obstructive pulmonary disease with (acute) exacerbation: Secondary | ICD-10-CM | POA: Diagnosis not present

## 2012-04-26 ENCOUNTER — Ambulatory Visit: Payer: Medicare Other | Admitting: Cardiology

## 2012-04-29 DIAGNOSIS — L97409 Non-pressure chronic ulcer of unspecified heel and midfoot with unspecified severity: Secondary | ICD-10-CM | POA: Diagnosis not present

## 2012-04-29 DIAGNOSIS — E1169 Type 2 diabetes mellitus with other specified complication: Secondary | ICD-10-CM | POA: Diagnosis not present

## 2012-04-29 DIAGNOSIS — M069 Rheumatoid arthritis, unspecified: Secondary | ICD-10-CM | POA: Diagnosis not present

## 2012-04-29 DIAGNOSIS — L97509 Non-pressure chronic ulcer of other part of unspecified foot with unspecified severity: Secondary | ICD-10-CM | POA: Diagnosis not present

## 2012-04-29 DIAGNOSIS — I70269 Atherosclerosis of native arteries of extremities with gangrene, unspecified extremity: Secondary | ICD-10-CM | POA: Diagnosis not present

## 2012-04-29 DIAGNOSIS — J441 Chronic obstructive pulmonary disease with (acute) exacerbation: Secondary | ICD-10-CM | POA: Diagnosis not present

## 2012-04-29 DIAGNOSIS — I10 Essential (primary) hypertension: Secondary | ICD-10-CM | POA: Diagnosis not present

## 2012-04-29 DIAGNOSIS — M86679 Other chronic osteomyelitis, unspecified ankle and foot: Secondary | ICD-10-CM | POA: Diagnosis not present

## 2012-04-30 DIAGNOSIS — J441 Chronic obstructive pulmonary disease with (acute) exacerbation: Secondary | ICD-10-CM | POA: Diagnosis not present

## 2012-04-30 DIAGNOSIS — I10 Essential (primary) hypertension: Secondary | ICD-10-CM | POA: Diagnosis not present

## 2012-04-30 DIAGNOSIS — M069 Rheumatoid arthritis, unspecified: Secondary | ICD-10-CM | POA: Diagnosis not present

## 2012-04-30 DIAGNOSIS — E1169 Type 2 diabetes mellitus with other specified complication: Secondary | ICD-10-CM | POA: Diagnosis not present

## 2012-04-30 DIAGNOSIS — L97509 Non-pressure chronic ulcer of other part of unspecified foot with unspecified severity: Secondary | ICD-10-CM | POA: Diagnosis not present

## 2012-05-02 DIAGNOSIS — J441 Chronic obstructive pulmonary disease with (acute) exacerbation: Secondary | ICD-10-CM | POA: Diagnosis not present

## 2012-05-02 DIAGNOSIS — E1169 Type 2 diabetes mellitus with other specified complication: Secondary | ICD-10-CM | POA: Diagnosis not present

## 2012-05-02 DIAGNOSIS — L97509 Non-pressure chronic ulcer of other part of unspecified foot with unspecified severity: Secondary | ICD-10-CM | POA: Diagnosis not present

## 2012-05-02 DIAGNOSIS — M069 Rheumatoid arthritis, unspecified: Secondary | ICD-10-CM | POA: Diagnosis not present

## 2012-05-02 DIAGNOSIS — I10 Essential (primary) hypertension: Secondary | ICD-10-CM | POA: Diagnosis not present

## 2012-05-07 DIAGNOSIS — L97509 Non-pressure chronic ulcer of other part of unspecified foot with unspecified severity: Secondary | ICD-10-CM | POA: Diagnosis not present

## 2012-05-07 DIAGNOSIS — J441 Chronic obstructive pulmonary disease with (acute) exacerbation: Secondary | ICD-10-CM | POA: Diagnosis not present

## 2012-05-07 DIAGNOSIS — M069 Rheumatoid arthritis, unspecified: Secondary | ICD-10-CM | POA: Diagnosis not present

## 2012-05-07 DIAGNOSIS — E1169 Type 2 diabetes mellitus with other specified complication: Secondary | ICD-10-CM | POA: Diagnosis not present

## 2012-05-07 DIAGNOSIS — I10 Essential (primary) hypertension: Secondary | ICD-10-CM | POA: Diagnosis not present

## 2012-05-09 DIAGNOSIS — E1169 Type 2 diabetes mellitus with other specified complication: Secondary | ICD-10-CM | POA: Diagnosis not present

## 2012-05-09 DIAGNOSIS — I10 Essential (primary) hypertension: Secondary | ICD-10-CM | POA: Diagnosis not present

## 2012-05-09 DIAGNOSIS — J441 Chronic obstructive pulmonary disease with (acute) exacerbation: Secondary | ICD-10-CM | POA: Diagnosis not present

## 2012-05-09 DIAGNOSIS — M069 Rheumatoid arthritis, unspecified: Secondary | ICD-10-CM | POA: Diagnosis not present

## 2012-05-09 DIAGNOSIS — L97509 Non-pressure chronic ulcer of other part of unspecified foot with unspecified severity: Secondary | ICD-10-CM | POA: Diagnosis not present

## 2012-05-10 ENCOUNTER — Encounter: Payer: Self-pay | Admitting: Cardiology

## 2012-05-10 ENCOUNTER — Ambulatory Visit (INDEPENDENT_AMBULATORY_CARE_PROVIDER_SITE_OTHER): Payer: Medicare Other | Admitting: Cardiology

## 2012-05-10 VITALS — BP 125/69 | HR 72 | Wt 139.0 lb

## 2012-05-10 DIAGNOSIS — I4891 Unspecified atrial fibrillation: Secondary | ICD-10-CM

## 2012-05-10 DIAGNOSIS — I10 Essential (primary) hypertension: Secondary | ICD-10-CM | POA: Diagnosis not present

## 2012-05-10 DIAGNOSIS — E785 Hyperlipidemia, unspecified: Secondary | ICD-10-CM | POA: Diagnosis not present

## 2012-05-10 NOTE — Assessment & Plan Note (Signed)
Patient's rate is controlled. Continue Coumadin.

## 2012-05-10 NOTE — Patient Instructions (Addendum)
Your physician wants you to follow-up in: ONE YEAR WITH DR CRENSHAW You will receive a reminder letter in the mail two months in advance. If you don't receive a letter, please call our office to schedule the follow-up appointment.  

## 2012-05-10 NOTE — Assessment & Plan Note (Signed)
Given history of documented peripheral vascular disease and diabetes mellitus agent would benefit from a statin long-term. However her primary care physician discontinued Lipitor previously but she does not recall the reason. She will discuss this issue with him.

## 2012-05-10 NOTE — Assessment & Plan Note (Signed)
Blood pressure controlled. Continue present medications. 

## 2012-05-10 NOTE — Progress Notes (Signed)
HPI: Pleasant female for followup of atrial fibrillation. Patient has permanent atrial fibrillation. Echocardiogram in August of 2011 showed normal LV function, mild left ventricular hypertrophy, mild left atrial enlargement, mild aortic and tricuspid regurgitation. Holter monitor in February of 2010 showed A. fib with controlled ventricular response. Myoview in June of 2008 showed normal LV function with an ejection fraction of 63% and normal perfusion. Patient also has peripheral vascular disease. ABIs in January of 2013 showed greater than 50% bilateral SFA stenosis. Patient last seen in Dec 2012. Since then, the patient denies any dyspnea on exertion, orthopnea, PND, pedal edema, palpitations, syncope or chest pain. She has had problems with nonhealing ulcers on her feet.   Current Outpatient Prescriptions  Medication Sig Dispense Refill  . Ascorbic Acid (VITAMIN C PO) Take by mouth daily.        . calcium carbonate (OS-CAL) 600 MG TABS Take 600 mg by mouth daily.      . Calcium-Vitamin D-Vitamin K (CVS CALCIUM SOFT CHEWS PO) Take 2 tablets by mouth daily.      . Cholecalciferol (VITAMIN D3) 400 UNITS CAPS Take 1 tablet by mouth daily.        . Cyanocobalamin (VITAMIN B12 PO) Take by mouth daily.        Marland Kitchen doxycycline (VIBRA-TABS) 100 MG tablet Take 100 mg by mouth 2 (two) times daily.      Marland Kitchen FOLIC ACID PO Take 1 tablet by mouth daily with breakfast.       . furosemide (LASIX) 20 MG tablet Take 20 mg by mouth every other day.       . gabapentin (NEURONTIN) 300 MG capsule Take 300 mg by mouth 2 (two) times daily.       Marland Kitchen glimepiride (AMARYL) 4 MG tablet Take 4 mg by mouth daily before breakfast.       . levothyroxine (SYNTHROID, LEVOTHROID) 50 MCG tablet Take 50 mcg by mouth daily with breakfast.       . lisinopril-hydrochlorothiazide (PRINZIDE,ZESTORETIC) 20-25 MG per tablet Take 1 tablet by mouth daily with breakfast.       . metFORMIN (GLUCOPHAGE) 500 MG tablet Take 500 mg by mouth daily  with breakfast.       . METHOTREXATE SODIUM IJ Inject 0.4 mLs as directed once a week. On tuesday      . Multiple Vitamin (MULITIVITAMIN WITH MINERALS) TABS Take 1 tablet by mouth daily.      . Multiple Vitamins-Minerals (ICAPS AREDS FORMULA PO) Take 1 capsule by mouth daily with breakfast.      . mupirocin ointment (BACTROBAN) 2 % prn      . nitroGLYCERIN (NITRODUR - DOSED IN MG/24 HR) 0.2 mg/hr Place 1 patch onto the skin daily.       Marland Kitchen tiotropium (SPIRIVA) 18 MCG inhalation capsule Place 18 mcg into inhaler and inhale daily with breakfast.       . warfarin (COUMADIN) 5 MG tablet Take 2.5-5 mg by mouth daily. Take 5mg  on Monday, Wednesday, Friday and Saturday. 2.5 mg the rest of the week         Past Medical History  Diagnosis Date  . Chronic atrial fibrillation   . Lung disease     BRONCHOSPASTIC  . Neuropathy   . Hypertension   . Hypercholesterolemia   . Hypothyroidism   . Diabetes mellitus     NON-INSULIN DEPENDENT  . Anemia   . COPD (chronic obstructive pulmonary disease)   . Pneumonia   . Blood transfusion  2012 ?APRIL    Past Surgical History  Procedure Date  . Total hip arthroplasty     LEFT HIP  . Appendectomy   . Back surgery   . Fracture surgery 2011    LEFT HIP FX /REPLACEMENT   . Joint replacement     HIP LEFT   . Cholecystectomy 1951  . Tubal ligation   . Dilation and curettage of uterus   . Abdominal hysterectomy 1975  . Amputation 11/09/2011    Procedure: AMPUTATION DIGIT;  Surgeon: Kathryne Hitch, MD;  Location: [facility] OR;  Service: Orthopedics;  Laterality: Right;  Right Great Toe Amputation  . Eye surgery     bilateral -cataract surgery  . Amputation 01/05/2012    Procedure: AMPUTATION RAY;  Surgeon: Kathryne Hitch, MD;  Location: [facility] ORS;  Service: Orthopedics;  Laterality: Left;  Amputation Left 4th and 5th Toes    History   Social History  . Marital Status: Widowed    Spouse Name: N/A    Number of Children: N/A  . Years of  Education: N/A   Occupational History  . Not on file.   Social History Main Topics  . Smoking status: Former Smoker    Types: Cigarettes  . Smokeless tobacco: Former Neurosurgeon    Quit date: 01/01/1981  . Alcohol Use: No  . Drug Use: No  . Sexually Active: No   Other Topics Concern  . Not on file   Social History Narrative  . No narrative on file    ROS: some pain in feet but no fevers or chills, productive cough, hemoptysis, dysphasia, odynophagia, melena, hematochezia, dysuria, hematuria, rash, seizure activity, orthopnea, PND, pedal edema, claudication. Remaining systems are negative.  Physical Exam: Well-developed well-nourished in no acute distress.  Skin is warm and dry.  HEENT is normal.  Neck is supple.  Chest is clear to auscultation with normal expansion.  Cardiovascular exam is irregular Abdominal exam nontender or distended. No masses palpated. Extremities show no edema., feet dressed bilaterally. neuro grossly intact  ECG atrial fibrillation at a rate of 72. Right axis deviation. Cannot rule out prior septal infarct.

## 2012-05-14 DIAGNOSIS — I10 Essential (primary) hypertension: Secondary | ICD-10-CM | POA: Diagnosis not present

## 2012-05-14 DIAGNOSIS — J441 Chronic obstructive pulmonary disease with (acute) exacerbation: Secondary | ICD-10-CM | POA: Diagnosis not present

## 2012-05-14 DIAGNOSIS — E1169 Type 2 diabetes mellitus with other specified complication: Secondary | ICD-10-CM | POA: Diagnosis not present

## 2012-05-14 DIAGNOSIS — M069 Rheumatoid arthritis, unspecified: Secondary | ICD-10-CM | POA: Diagnosis not present

## 2012-05-14 DIAGNOSIS — L97509 Non-pressure chronic ulcer of other part of unspecified foot with unspecified severity: Secondary | ICD-10-CM | POA: Diagnosis not present

## 2012-05-16 DIAGNOSIS — I10 Essential (primary) hypertension: Secondary | ICD-10-CM | POA: Diagnosis not present

## 2012-05-16 DIAGNOSIS — J441 Chronic obstructive pulmonary disease with (acute) exacerbation: Secondary | ICD-10-CM | POA: Diagnosis not present

## 2012-05-16 DIAGNOSIS — E1169 Type 2 diabetes mellitus with other specified complication: Secondary | ICD-10-CM | POA: Diagnosis not present

## 2012-05-16 DIAGNOSIS — L97509 Non-pressure chronic ulcer of other part of unspecified foot with unspecified severity: Secondary | ICD-10-CM | POA: Diagnosis not present

## 2012-05-16 DIAGNOSIS — M069 Rheumatoid arthritis, unspecified: Secondary | ICD-10-CM | POA: Diagnosis not present

## 2012-05-20 DIAGNOSIS — M86679 Other chronic osteomyelitis, unspecified ankle and foot: Secondary | ICD-10-CM | POA: Diagnosis not present

## 2012-05-20 DIAGNOSIS — L97409 Non-pressure chronic ulcer of unspecified heel and midfoot with unspecified severity: Secondary | ICD-10-CM | POA: Diagnosis not present

## 2012-05-21 DIAGNOSIS — L97509 Non-pressure chronic ulcer of other part of unspecified foot with unspecified severity: Secondary | ICD-10-CM | POA: Diagnosis not present

## 2012-05-21 DIAGNOSIS — I10 Essential (primary) hypertension: Secondary | ICD-10-CM | POA: Diagnosis not present

## 2012-05-21 DIAGNOSIS — J441 Chronic obstructive pulmonary disease with (acute) exacerbation: Secondary | ICD-10-CM | POA: Diagnosis not present

## 2012-05-21 DIAGNOSIS — E1169 Type 2 diabetes mellitus with other specified complication: Secondary | ICD-10-CM | POA: Diagnosis not present

## 2012-05-21 DIAGNOSIS — M069 Rheumatoid arthritis, unspecified: Secondary | ICD-10-CM | POA: Diagnosis not present

## 2012-05-23 DIAGNOSIS — J441 Chronic obstructive pulmonary disease with (acute) exacerbation: Secondary | ICD-10-CM | POA: Diagnosis not present

## 2012-05-23 DIAGNOSIS — M069 Rheumatoid arthritis, unspecified: Secondary | ICD-10-CM | POA: Diagnosis not present

## 2012-05-23 DIAGNOSIS — I10 Essential (primary) hypertension: Secondary | ICD-10-CM | POA: Diagnosis not present

## 2012-05-23 DIAGNOSIS — E1169 Type 2 diabetes mellitus with other specified complication: Secondary | ICD-10-CM | POA: Diagnosis not present

## 2012-05-23 DIAGNOSIS — L97509 Non-pressure chronic ulcer of other part of unspecified foot with unspecified severity: Secondary | ICD-10-CM | POA: Diagnosis not present

## 2012-05-28 DIAGNOSIS — L97409 Non-pressure chronic ulcer of unspecified heel and midfoot with unspecified severity: Secondary | ICD-10-CM | POA: Diagnosis not present

## 2012-05-28 DIAGNOSIS — L97509 Non-pressure chronic ulcer of other part of unspecified foot with unspecified severity: Secondary | ICD-10-CM | POA: Diagnosis not present

## 2012-05-28 DIAGNOSIS — I10 Essential (primary) hypertension: Secondary | ICD-10-CM | POA: Diagnosis not present

## 2012-05-28 DIAGNOSIS — E1169 Type 2 diabetes mellitus with other specified complication: Secondary | ICD-10-CM | POA: Diagnosis not present

## 2012-05-28 DIAGNOSIS — M86679 Other chronic osteomyelitis, unspecified ankle and foot: Secondary | ICD-10-CM | POA: Diagnosis not present

## 2012-05-28 DIAGNOSIS — I70269 Atherosclerosis of native arteries of extremities with gangrene, unspecified extremity: Secondary | ICD-10-CM | POA: Diagnosis not present

## 2012-05-28 DIAGNOSIS — J441 Chronic obstructive pulmonary disease with (acute) exacerbation: Secondary | ICD-10-CM | POA: Diagnosis not present

## 2012-05-28 DIAGNOSIS — M069 Rheumatoid arthritis, unspecified: Secondary | ICD-10-CM | POA: Diagnosis not present

## 2012-05-29 ENCOUNTER — Other Ambulatory Visit (HOSPITAL_COMMUNITY): Payer: Self-pay | Admitting: Orthopedic Surgery

## 2012-05-29 NOTE — Pre-Procedure Instructions (Signed)
20 Sydney [name]  05/29/2012   Your procedure is scheduled on:  Friday May 31, 2012  Report to [facility] at 12:35 AM.  Call this number if you have problems the morning of surgery: [phone]   Remember:   Do not eat food or drink :After Midnight.      Take these medicines the morning of surgery with A SIP OF WATER: doxycycline, gabapentin, levothyroxine, spiriva, Nitroglycerin (if needed),    Do not wear jewelry, make-up or nail polish.  Do not wear lotions, powders, or perfumes.   Do not shave 48 hours prior to surgery. Men may shave face and neck.  Do not bring valuables to the hospital.  Contacts, dentures or bridgework may not be worn into surgery.  Leave suitcase in the car. After surgery it may be brought to your room.  For patients admitted to the hospital, checkout time is 11:00 AM the day of discharge.   Patients discharged the day of surgery will not be allowed to drive home.  Name and phone number of your driver: family / friend  Special Instructions: Shower using CHG 2 nights before surgery and the night before surgery.  If you shower the day of surgery use CHG.  Use special wash - you have one bottle of CHG for all showers.  You should use approximately 1/3 of the bottle for each shower.   Please read over the following fact sheets that you were given: Pain Booklet, Coughing and Deep Breathing, MRSA Information and Surgical Site Infection Prevention

## 2012-05-30 ENCOUNTER — Encounter (HOSPITAL_COMMUNITY): Payer: Self-pay | Admitting: *Deleted

## 2012-05-30 ENCOUNTER — Inpatient Hospital Stay (HOSPITAL_COMMUNITY): Admission: RE | Admit: 2012-05-30 | Discharge: 2012-05-30 | Payer: Medicare Other | Source: Ambulatory Visit

## 2012-05-30 DIAGNOSIS — M069 Rheumatoid arthritis, unspecified: Secondary | ICD-10-CM | POA: Diagnosis not present

## 2012-05-30 DIAGNOSIS — I10 Essential (primary) hypertension: Secondary | ICD-10-CM | POA: Diagnosis not present

## 2012-05-30 DIAGNOSIS — E1169 Type 2 diabetes mellitus with other specified complication: Secondary | ICD-10-CM | POA: Diagnosis not present

## 2012-05-30 DIAGNOSIS — L97509 Non-pressure chronic ulcer of other part of unspecified foot with unspecified severity: Secondary | ICD-10-CM | POA: Diagnosis not present

## 2012-05-30 DIAGNOSIS — J441 Chronic obstructive pulmonary disease with (acute) exacerbation: Secondary | ICD-10-CM | POA: Diagnosis not present

## 2012-05-30 MED ORDER — CEFAZOLIN SODIUM-DEXTROSE 2-3 GM-% IV SOLR
2.0000 g | INTRAVENOUS | Status: AC
  Administered 2012-05-31: 2 g via INTRAVENOUS
  Filled 2012-05-30: qty 50

## 2012-05-30 NOTE — Progress Notes (Addendum)
Dr.Crenshaw is cardiologist with last visit in Sept with report in epic  Heart cath in 2005-report in epic  Echo report in epic from 2011  Nuclear Stress test done >76yrs ago  EKG in epic from 07/30/12  CXR in epic from 10/16/11  Medical MD Monica Becton

## 2012-05-31 ENCOUNTER — Encounter (HOSPITAL_COMMUNITY)
Admission: RE | Disposition: A | Discharge status: Home / Self Care | Payer: Self-pay | Source: Ambulatory Visit | Attending: Orthopedic Surgery

## 2012-05-31 ENCOUNTER — Encounter (HOSPITAL_COMMUNITY): Payer: Self-pay | Admitting: Anesthesiology

## 2012-05-31 ENCOUNTER — Ambulatory Visit (HOSPITAL_COMMUNITY)
Admission: RE | Admit: 2012-05-31 | Discharge: 2012-06-02 | Disposition: A | Discharge status: Home Health | Payer: Medicare Other | Source: Ambulatory Visit | Attending: Orthopedic Surgery | Admitting: Orthopedic Surgery

## 2012-05-31 ENCOUNTER — Encounter (HOSPITAL_COMMUNITY): Payer: Self-pay | Admitting: *Deleted

## 2012-05-31 ENCOUNTER — Ambulatory Visit (HOSPITAL_COMMUNITY): Payer: Medicare Other | Admitting: Anesthesiology

## 2012-05-31 DIAGNOSIS — Z96649 Presence of unspecified artificial hip joint: Secondary | ICD-10-CM | POA: Diagnosis not present

## 2012-05-31 DIAGNOSIS — Z7901 Long term (current) use of anticoagulants: Secondary | ICD-10-CM | POA: Diagnosis not present

## 2012-05-31 DIAGNOSIS — Z01810 Encounter for preprocedural cardiovascular examination: Secondary | ICD-10-CM | POA: Insufficient documentation

## 2012-05-31 DIAGNOSIS — I10 Essential (primary) hypertension: Secondary | ICD-10-CM | POA: Diagnosis not present

## 2012-05-31 DIAGNOSIS — I4891 Unspecified atrial fibrillation: Secondary | ICD-10-CM | POA: Insufficient documentation

## 2012-05-31 DIAGNOSIS — E7800 Pure hypercholesterolemia, unspecified: Secondary | ICD-10-CM | POA: Diagnosis not present

## 2012-05-31 DIAGNOSIS — Z01818 Encounter for other preprocedural examination: Secondary | ICD-10-CM | POA: Diagnosis not present

## 2012-05-31 DIAGNOSIS — J438 Other emphysema: Secondary | ICD-10-CM | POA: Insufficient documentation

## 2012-05-31 DIAGNOSIS — M869 Osteomyelitis, unspecified: Secondary | ICD-10-CM | POA: Diagnosis not present

## 2012-05-31 DIAGNOSIS — E039 Hypothyroidism, unspecified: Secondary | ICD-10-CM | POA: Diagnosis not present

## 2012-05-31 DIAGNOSIS — I509 Heart failure, unspecified: Secondary | ICD-10-CM | POA: Diagnosis not present

## 2012-05-31 DIAGNOSIS — H3530 Unspecified macular degeneration: Secondary | ICD-10-CM | POA: Diagnosis not present

## 2012-05-31 DIAGNOSIS — L97409 Non-pressure chronic ulcer of unspecified heel and midfoot with unspecified severity: Secondary | ICD-10-CM | POA: Diagnosis not present

## 2012-05-31 DIAGNOSIS — M069 Rheumatoid arthritis, unspecified: Secondary | ICD-10-CM | POA: Diagnosis not present

## 2012-05-31 DIAGNOSIS — E1169 Type 2 diabetes mellitus with other specified complication: Secondary | ICD-10-CM | POA: Diagnosis not present

## 2012-05-31 DIAGNOSIS — M9080 Osteopathy in diseases classified elsewhere, unspecified site: Secondary | ICD-10-CM | POA: Diagnosis not present

## 2012-05-31 DIAGNOSIS — I96 Gangrene, not elsewhere classified: Secondary | ICD-10-CM | POA: Diagnosis not present

## 2012-05-31 DIAGNOSIS — R0902 Hypoxemia: Secondary | ICD-10-CM | POA: Insufficient documentation

## 2012-05-31 DIAGNOSIS — I70269 Atherosclerosis of native arteries of extremities with gangrene, unspecified extremity: Secondary | ICD-10-CM | POA: Diagnosis not present

## 2012-05-31 DIAGNOSIS — M86679 Other chronic osteomyelitis, unspecified ankle and foot: Secondary | ICD-10-CM | POA: Diagnosis not present

## 2012-05-31 DIAGNOSIS — L97509 Non-pressure chronic ulcer of other part of unspecified foot with unspecified severity: Secondary | ICD-10-CM | POA: Diagnosis not present

## 2012-05-31 HISTORY — DX: Peripheral vascular disease, unspecified: I73.9

## 2012-05-31 HISTORY — DX: Heart failure, unspecified: I50.9

## 2012-05-31 HISTORY — DX: Unspecified macular degeneration: H35.30

## 2012-05-31 HISTORY — PX: AMPUTATION: SHX166

## 2012-05-31 HISTORY — DX: Emphysema, unspecified: J43.9

## 2012-05-31 HISTORY — DX: Personal history of other (healed) physical injury and trauma: Z87.828

## 2012-05-31 HISTORY — DX: Other specified noninflammatory disorders of vagina: N89.8

## 2012-05-31 LAB — CBC
MCHC: 32.9 g/dL (ref 30.0–36.0)
MCV: 88.8 fL (ref 78.0–100.0)
Platelets: 273 10*3/uL (ref 150–400)
RDW: 14.8 % (ref 11.5–15.5)
WBC: 8.6 10*3/uL (ref 4.0–10.5)

## 2012-05-31 LAB — COMPREHENSIVE METABOLIC PANEL
AST: 21 U/L (ref 0–37)
Albumin: 2.9 g/dL — ABNORMAL LOW (ref 3.5–5.2)
Chloride: 97 mEq/L (ref 96–112)
Creatinine, Ser: 1.09 mg/dL (ref 0.50–1.10)
Total Bilirubin: 0.4 mg/dL (ref 0.3–1.2)
Total Protein: 6.6 g/dL (ref 6.0–8.3)

## 2012-05-31 LAB — SURGICAL PCR SCREEN
MRSA, PCR: NEGATIVE
Staphylococcus aureus: NEGATIVE

## 2012-05-31 LAB — APTT: aPTT: 89 seconds — ABNORMAL HIGH (ref 24–37)

## 2012-05-31 LAB — PROTIME-INR
INR: 3.62 — ABNORMAL HIGH (ref 0.00–1.49)
Prothrombin Time: 34 s — ABNORMAL HIGH (ref 11.6–15.2)

## 2012-05-31 LAB — GLUCOSE, CAPILLARY: Glucose-Capillary: 86 mg/dL (ref 70–99)

## 2012-05-31 SURGERY — AMPUTATION, FOOT, RAY
Anesthesia: General | Site: Foot | Laterality: Left | Wound class: Dirty or Infected

## 2012-05-31 MED ORDER — LEVOTHYROXINE SODIUM 50 MCG PO TABS
50.0000 ug | ORAL_TABLET | Freq: Every day | ORAL | Status: DC
  Administered 2012-06-01 – 2012-06-02 (×2): 50 ug via ORAL
  Filled 2012-05-31 (×3): qty 1

## 2012-05-31 MED ORDER — FUROSEMIDE 20 MG PO TABS
20.0000 mg | ORAL_TABLET | ORAL | Status: DC
  Administered 2012-06-01: 20 mg via ORAL
  Filled 2012-05-31: qty 1

## 2012-05-31 MED ORDER — METOCLOPRAMIDE HCL 5 MG/ML IJ SOLN: 5.0000 mg | Freq: Three times a day (TID) | INTRAMUSCULAR | Status: DC | PRN

## 2012-05-31 MED ORDER — LISINOPRIL-HYDROCHLOROTHIAZIDE 20-25 MG PO TABS: 1.0000 | ORAL_TABLET | Freq: Every day | ORAL | Status: DC

## 2012-05-31 MED ORDER — METHOCARBAMOL 500 MG PO TABS
500.0000 mg | ORAL_TABLET | Freq: Four times a day (QID) | ORAL | Status: DC | PRN
  Filled 2012-05-31: qty 1

## 2012-05-31 MED ORDER — NITROGLYCERIN 0.2 MG/HR TD PT24
0.2000 mg | MEDICATED_PATCH | Freq: Every day | TRANSDERMAL | Status: DC
  Administered 2012-05-31 – 2012-06-02 (×3): 0.2 mg via TRANSDERMAL
  Filled 2012-05-31 (×4): qty 1

## 2012-05-31 MED ORDER — CEFAZOLIN SODIUM 1-5 GM-% IV SOLN
1.0000 g | Freq: Four times a day (QID) | INTRAVENOUS | Status: AC
  Administered 2012-05-31 – 2012-06-01 (×3): 1 g via INTRAVENOUS
  Filled 2012-05-31 (×4): qty 50

## 2012-05-31 MED ORDER — ONDANSETRON HCL 4 MG/2ML IJ SOLN
INTRAMUSCULAR | Status: DC | PRN
  Administered 2012-05-31: 4 mg via INTRAVENOUS

## 2012-05-31 MED ORDER — SODIUM CHLORIDE 0.9 % IV SOLN
INTRAVENOUS | Status: DC
  Administered 2012-05-31 – 2012-06-02 (×2): via INTRAVENOUS

## 2012-05-31 MED ORDER — LIDOCAINE HCL (CARDIAC) 20 MG/ML IV SOLN
INTRAVENOUS | Status: DC | PRN
  Administered 2012-05-31: 40 mg via INTRAVENOUS

## 2012-05-31 MED ORDER — ONDANSETRON HCL 4 MG PO TABS: 4.0000 mg | ORAL_TABLET | Freq: Four times a day (QID) | ORAL | Status: DC | PRN

## 2012-05-31 MED ORDER — METFORMIN HCL 500 MG PO TABS
500.0000 mg | ORAL_TABLET | Freq: Every day | ORAL | Status: DC
  Administered 2012-06-01 – 2012-06-02 (×2): 500 mg via ORAL
  Filled 2012-05-31 (×3): qty 1

## 2012-05-31 MED ORDER — LISINOPRIL 20 MG PO TABS
20.0000 mg | ORAL_TABLET | Freq: Every day | ORAL | Status: DC
  Administered 2012-05-31 – 2012-06-01 (×2): 20 mg via ORAL
  Filled 2012-05-31 (×3): qty 1

## 2012-05-31 MED ORDER — OXYCODONE-ACETAMINOPHEN 5-325 MG PO TABS
1.0000 | ORAL_TABLET | ORAL | Status: DC | PRN
  Administered 2012-05-31: 2 via ORAL
  Administered 2012-05-31 – 2012-06-01 (×3): 1 via ORAL
  Filled 2012-05-31 (×3): qty 1
  Filled 2012-05-31 (×2): qty 2

## 2012-05-31 MED ORDER — GABAPENTIN 300 MG PO CAPS
300.0000 mg | ORAL_CAPSULE | Freq: Two times a day (BID) | ORAL | Status: DC
  Administered 2012-05-31 – 2012-06-02 (×4): 300 mg via ORAL
  Filled 2012-05-31 (×5): qty 1

## 2012-05-31 MED ORDER — HYDROCHLOROTHIAZIDE 25 MG PO TABS
25.0000 mg | ORAL_TABLET | Freq: Every day | ORAL | Status: DC
  Administered 2012-05-31: 25 mg via ORAL
  Filled 2012-05-31 (×3): qty 1

## 2012-05-31 MED ORDER — MUPIROCIN 2 % EX OINT
TOPICAL_OINTMENT | CUTANEOUS | Status: AC
  Administered 2012-05-31: 1 via NASAL
  Filled 2012-05-31: qty 22

## 2012-05-31 MED ORDER — PHENYLEPHRINE HCL 10 MG/ML IJ SOLN
INTRAMUSCULAR | Status: DC | PRN
  Administered 2012-05-31: 80 ug via INTRAVENOUS

## 2012-05-31 MED ORDER — ONDANSETRON HCL 4 MG/2ML IJ SOLN: 4.0000 mg | Freq: Four times a day (QID) | INTRAMUSCULAR | Status: DC | PRN

## 2012-05-31 MED ORDER — GLIMEPIRIDE 4 MG PO TABS
4.0000 mg | ORAL_TABLET | Freq: Every day | ORAL | Status: DC
  Administered 2012-06-01 – 2012-06-02 (×2): 4 mg via ORAL
  Filled 2012-05-31 (×3): qty 1

## 2012-05-31 MED ORDER — METHOCARBAMOL 100 MG/ML IJ SOLN
500.0000 mg | Freq: Four times a day (QID) | INTRAVENOUS | Status: DC | PRN
  Filled 2012-05-31: qty 5

## 2012-05-31 MED ORDER — LACTATED RINGERS IV SOLN
INTRAVENOUS | Status: DC | PRN
  Administered 2012-05-31: 14:00:00 via INTRAVENOUS

## 2012-05-31 MED ORDER — INSULIN ASPART 100 UNIT/ML ~~LOC~~ SOLN
0.0000 [IU] | Freq: Three times a day (TID) | SUBCUTANEOUS | Status: DC
  Administered 2012-06-01: 2 [IU] via SUBCUTANEOUS

## 2012-05-31 MED ORDER — 0.9 % SODIUM CHLORIDE (POUR BTL) OPTIME
TOPICAL | Status: DC | PRN
  Administered 2012-05-31: 1000 mL

## 2012-05-31 MED ORDER — FENTANYL CITRATE 0.05 MG/ML IJ SOLN
INTRAMUSCULAR | Status: DC | PRN
  Administered 2012-05-31: 50 ug via INTRAVENOUS

## 2012-05-31 MED ORDER — METOCLOPRAMIDE HCL 5 MG PO TABS
5.0000 mg | ORAL_TABLET | Freq: Three times a day (TID) | ORAL | Status: DC | PRN
  Filled 2012-05-31: qty 2

## 2012-05-31 MED ORDER — TIOTROPIUM BROMIDE MONOHYDRATE 18 MCG IN CAPS
18.0000 ug | ORAL_CAPSULE | Freq: Every day | RESPIRATORY_TRACT | Status: DC
  Administered 2012-06-01 – 2012-06-02 (×2): 18 ug via RESPIRATORY_TRACT
  Filled 2012-05-31: qty 5

## 2012-05-31 MED ORDER — HYDROMORPHONE HCL PF 1 MG/ML IJ SOLN
0.5000 mg | INTRAMUSCULAR | Status: DC | PRN
  Administered 2012-05-31: 1 mg via INTRAVENOUS
  Filled 2012-05-31: qty 1

## 2012-05-31 SURGICAL SUPPLY — 41 items
BANDAGE ESMARK 6X9 LF (GAUZE/BANDAGES/DRESSINGS) IMPLANT
BANDAGE GAUZE ELAST BULKY 4 IN (GAUZE/BANDAGES/DRESSINGS) ×2 IMPLANT
BLADE SAW SGTL MED 73X18.5 STR (BLADE) IMPLANT
BNDG CMPR 9X6 STRL LF SNTH (GAUZE/BANDAGES/DRESSINGS)
BNDG COHESIVE 4X5 TAN STRL (GAUZE/BANDAGES/DRESSINGS) ×2 IMPLANT
BNDG COHESIVE 6X5 TAN STRL LF (GAUZE/BANDAGES/DRESSINGS) ×2 IMPLANT
BNDG ESMARK 6X9 LF (GAUZE/BANDAGES/DRESSINGS)
CLOTH BEACON ORANGE TIMEOUT ST (SAFETY) ×2 IMPLANT
CUFF TOURNIQUET SINGLE 34IN LL (TOURNIQUET CUFF) IMPLANT
CUFF TOURNIQUET SINGLE 44IN (TOURNIQUET CUFF) IMPLANT
DRAPE U-SHAPE 47X51 STRL (DRAPES) ×4 IMPLANT
DRSG ADAPTIC 3X8 NADH LF (GAUZE/BANDAGES/DRESSINGS) ×2 IMPLANT
DRSG PAD ABDOMINAL 8X10 ST (GAUZE/BANDAGES/DRESSINGS) ×1 IMPLANT
DURAPREP 26ML APPLICATOR (WOUND CARE) ×2 IMPLANT
ELECT REM PT RETURN 9FT ADLT (ELECTROSURGICAL) ×2
ELECTRODE REM PT RTRN 9FT ADLT (ELECTROSURGICAL) ×1 IMPLANT
GLOVE BIOGEL PI IND STRL 9 (GLOVE) ×1 IMPLANT
GLOVE BIOGEL PI INDICATOR 9 (GLOVE) ×1
GLOVE SURG ORTHO 9.0 STRL STRW (GLOVE) ×2 IMPLANT
GOWN PREVENTION PLUS XLARGE (GOWN DISPOSABLE) ×2 IMPLANT
GOWN SRG XL XLNG 56XLVL 4 (GOWN DISPOSABLE) ×1 IMPLANT
GOWN STRL NON-REIN XL XLG LVL4 (GOWN DISPOSABLE) ×2
KIT BASIN OR (CUSTOM PROCEDURE TRAY) ×2 IMPLANT
KIT ROOM TURNOVER OR (KITS) ×2 IMPLANT
MANIFOLD NEPTUNE II (INSTRUMENTS) ×1 IMPLANT
NS IRRIG 1000ML POUR BTL (IV SOLUTION) ×2 IMPLANT
PACK ORTHO EXTREMITY (CUSTOM PROCEDURE TRAY) ×2 IMPLANT
PAD ARMBOARD 7.5X6 YLW CONV (MISCELLANEOUS) ×4 IMPLANT
PAD CAST 4YDX4 CTTN HI CHSV (CAST SUPPLIES) ×1 IMPLANT
PADDING CAST COTTON 4X4 STRL (CAST SUPPLIES) ×2
SPONGE GAUZE 4X4 12PLY (GAUZE/BANDAGES/DRESSINGS) ×2 IMPLANT
SPONGE LAP 18X18 X RAY DECT (DISPOSABLE) ×2 IMPLANT
STAPLER VISISTAT 35W (STAPLE) ×2 IMPLANT
STOCKINETTE IMPERVIOUS LG (DRAPES) IMPLANT
SUCTION FRAZIER TIP 10 FR DISP (SUCTIONS) IMPLANT
SUT ETHILON 2 0 PSLX (SUTURE) ×4 IMPLANT
TOWEL OR 17X24 6PK STRL BLUE (TOWEL DISPOSABLE) ×2 IMPLANT
TOWEL OR 17X26 10 PK STRL BLUE (TOWEL DISPOSABLE) ×2 IMPLANT
TUBE CONNECTING 12X1/4 (SUCTIONS) ×2 IMPLANT
UNDERPAD 30X30 INCONTINENT (UNDERPADS AND DIAPERS) ×1 IMPLANT
WATER STERILE IRR 1000ML POUR (IV SOLUTION) ×1 IMPLANT

## 2012-05-31 NOTE — Anesthesia Preprocedure Evaluation (Addendum)
Anesthesia Evaluation  Patient identified by MRN, date of birth, ID band Patient awake    Reviewed: Allergy & Precautions, H&P , NPO status , Patient's Chart, lab work & pertinent test results, reviewed documented beta blocker date and time   Airway Mallampati: II TM Distance: >3 FB Neck ROM: full    Dental   Pulmonary shortness of breath, pneumonia -, resolved, COPD breath sounds clear to auscultation        Cardiovascular hypertension, On Medications +CHF + dysrhythmias Atrial Fibrillation Rhythm:regular     Neuro/Psych negative neurological ROS  negative psych ROS   GI/Hepatic negative GI ROS, Neg liver ROS,   Endo/Other  diabetes, Oral Hypoglycemic AgentsHypothyroidism   Renal/GU negative Renal ROS  negative genitourinary   Musculoskeletal   Abdominal   Peds  Hematology negative hematology ROS (+)   Anesthesia Other Findings See surgeon's H&P   Reproductive/Obstetrics negative OB ROS                           Anesthesia Physical Anesthesia Plan  ASA: III  Anesthesia Plan: General   Post-op Pain Management:    Induction: Intravenous  Airway Management Planned: LMA  Additional Equipment:   Intra-op Plan:   Post-operative Plan:   Informed Consent: I have reviewed the patients History and Physical, chart, labs and discussed the procedure including the risks, benefits and alternatives for the proposed anesthesia with the patient or authorized representative who has indicated his/her understanding and acceptance.   Dental Advisory Given  Plan Discussed with: CRNA and Surgeon  Anesthesia Plan Comments: (Dr. Lajoyce Corners aware of labs. CF)       Anesthesia Quick Evaluation

## 2012-05-31 NOTE — Transfer of Care (Signed)
Immediate Anesthesia Transfer of Care Note  Patient: Sydney [name]  Procedure(s) Performed: Procedure(s) (LRB) with comments: AMPUTATION RAY (Left) - Left foot 1st ray amp  Patient Location: PACU  Anesthesia Type: General  Level of Consciousness: awake, alert , oriented and patient cooperative  Airway & Oxygen Therapy: Patient Spontanous Breathing and Patient connected to nasal cannula oxygen  Post-op Assessment: Report given to PACU RN, Post -op Vital signs reviewed and stable and Patient moving all extremities  Post vital signs: Reviewed and stable  Complications: No apparent anesthesia complications

## 2012-05-31 NOTE — Preoperative (Signed)
Beta Blockers   Reason not to administer Beta Blockers:Not Applicable 

## 2012-05-31 NOTE — H&P (Signed)
Sydney [name] is an 78 y.o. female.   Chief Complaint: Osteomyelitis ulceration left foot first metatarsal head HPI: Patient is a 78 year old woman with ischemic ulceration left foot great toe. She has failed conservative treatment and presents at this time for first ray amputation.  Past Medical History  Diagnosis Date  . Lung disease     BRONCHOSPASTIC  . Neuropathy     takes Gabapentin bid  . Hypercholesterolemia     doesn't require meds for this  . Anemia   . COPD (chronic obstructive pulmonary disease)   . Blood transfusion 2012 ?APRIL  . Hypertension     takes Prinizide daily  . Chronic atrial fibrillation     takes Coumadin as instructed  . Peripheral vascular disease   . CHF (congestive heart failure)     takes Lasix every other day  . Shortness of breath     uses spiriva daily;with exertion  . Emphysema   . Pneumonia     hx of   . History of head injury     many yrs ago  . Arthritis     rheumatoid and takes Methotrexate weekly  . Bruises easily   . Open wound     with a foul odor-left big toe  . History of bladder infections   . Vaginal discharge     has seen GYN but says its nothing to worry about  . History of blood transfusion   . Diabetes mellitus     takes Metformin and Amaryl daily  . Hypothyroidism     takes Synthroid daily  . Macular degeneration     dry    Past Surgical History  Procedure Date  . Total hip arthroplasty     LEFT HIP  . Back surgery   . Fracture surgery 2011    LEFT HIP FX /REPLACEMENT   . Joint replacement     HIP LEFT   . Cholecystectomy 1951  . Dilation and curettage of uterus   . Abdominal hysterectomy 1975  . Amputation 11/09/2011    Procedure: AMPUTATION DIGIT;  Surgeon: Kathryne Hitch, MD;  Location: [facility] OR;  Service: Orthopedics;  Laterality: Right;  Right Great Toe Amputation  . Eye surgery     bilateral -cataract surgery  . Amputation 01/05/2012    Procedure: AMPUTATION RAY;  Surgeon: Kathryne Hitch, MD;  Location: [facility] ORS;  Service: Orthopedics;  Laterality: Left;  Amputation Left 4th and 5th Toes  . Cardioversion     Family History  Problem Relation Age of Onset  . Heart disease Mother   . Stroke Mother   . Heart disease Father   . Stroke Father   . Heart attack Father   . Muscular dystrophy Brother   . Anesthesia problems Neg Hx   . Hypotension Neg Hx   . Malignant hyperthermia Neg Hx   . Pseudochol deficiency Neg Hx    Social History:  reports that she has quit smoking. Her smoking use included Cigarettes. She quit smokeless tobacco use about 31 years ago. She reports that she does not drink alcohol or use illicit drugs.  Allergies:  Allergies  Allergen Reactions  . Other     Narcotics=makes her very disoriented  . Tramadol Anxiety  . Vicodin (Hydrocodone-Acetaminophen) Anxiety    Medications Prior to Admission  Medication Sig Dispense Refill  . Ascorbic Acid (VITAMIN C PO) Take by mouth daily.        . calcium carbonate (OS-CAL) 600 MG TABS  Take 600 mg by mouth daily.      . Calcium-Vitamin D-Vitamin K (CVS CALCIUM SOFT CHEWS PO) Take 2 tablets by mouth daily.      . Cholecalciferol (VITAMIN D3) 400 UNITS CAPS Take 1 tablet by mouth daily.        . Cyanocobalamin (VITAMIN B12 PO) Take by mouth daily.        Marland Kitchen doxycycline (VIBRA-TABS) 100 MG tablet Take 100 mg by mouth 2 (two) times daily.      Marland Kitchen FOLIC ACID PO Take 1 tablet by mouth daily with breakfast.       . furosemide (LASIX) 20 MG tablet Take 20 mg by mouth every other day.       . gabapentin (NEURONTIN) 300 MG capsule Take 300 mg by mouth 2 (two) times daily.       Marland Kitchen glimepiride (AMARYL) 4 MG tablet Take 4 mg by mouth daily before breakfast.       . levothyroxine (SYNTHROID, LEVOTHROID) 50 MCG tablet Take 50 mcg by mouth daily with breakfast.       . lisinopril-hydrochlorothiazide (PRINZIDE,ZESTORETIC) 20-25 MG per tablet Take 1 tablet by mouth daily with breakfast.       . metFORMIN (GLUCOPHAGE) 500  MG tablet Take 500 mg by mouth daily with breakfast.       . METHOTREXATE SODIUM IJ Inject 0.4 mLs as directed once a week. On tuesday      . Multiple Vitamin (MULITIVITAMIN WITH MINERALS) TABS Take 1 tablet by mouth daily.      . Multiple Vitamins-Minerals (ICAPS AREDS FORMULA PO) Take 1 capsule by mouth daily with breakfast.      . mupirocin ointment (BACTROBAN) 2 % prn      . nitroGLYCERIN (NITRODUR - DOSED IN MG/24 HR) 0.2 mg/hr Place 1 patch onto the skin daily.       Marland Kitchen tiotropium (SPIRIVA) 18 MCG inhalation capsule Place 18 mcg into inhaler and inhale daily with breakfast.       . warfarin (COUMADIN) 5 MG tablet Take 2.5-5 mg by mouth daily. Take 5mg  on Monday, Wednesday, Friday and Saturday. 2.5 mg the rest of the week        No results found for this or any previous visit (from the past 48 hour(s)). No results found.  Review of Systems  All other systems reviewed and are negative.    Height 5\' 5"  (1.651 m), weight 63.05 kg (139 lb). Physical Exam  Examination patient has diminished pulses she does have palpable pulses. She has ischemic changes to the left foot great toe. Assessment/Plan Assessment: Osteomyelitis ulceration left foot great toe.  Plan: Will plan for left foot first ray amputation. Risks and benefits were discussed including infection neurovascular injury nonhealing of the wound need for higher level amputation. Patient states she understands and wished to proceed at this time.  Buelah Rennie V 05/31/2012, 12:24 PM

## 2012-05-31 NOTE — Op Note (Signed)
OPERATIVE REPORT  DATE OF SURGERY: 05/31/2012  PATIENT:  Sydney [name],  79 y.o. female  PRE-OPERATIVE DIAGNOSIS:  Gangrene left great toe  POST-OPERATIVE DIAGNOSIS:  Gangrene left great toe  PROCEDURE:  Procedure(s): AMPUTATION RAY left foot first ray  SURGEON:  Surgeon(s): Nadara Mustard, MD  ANESTHESIA:   general  EBL:  Minimal ML  SPECIMEN:  Source of Specimen:  Left foot first ray  TOURNIQUET:  * No tourniquets in log *  PROCEDURE DETAILS: Patient is an 78 year old woman with severe peripheral vascular disease status post fifth ray amputation who presents at this time with a well-healed lateral ray amputations and presents with gangrenous changes to the first ray. She has failed conservative treatment and presents at this time for surgical intervention risks and benefits were discussed patient states he understands and wished to proceed at this time. Description of procedure patient was brought to the operating room and underwent a general anesthetic. After adequate levels of anesthesia were obtained patient's left lower extremity was prepped using DuraPrep draped into a sterile field. A racquet incision was made around the great toe the great toe and first metatarsal were resected in one block of tissue including the ulcer including all the necrotic tissue. There is good healthy viable tissue the base of the wound. The wound was irrigated with normal saline the skin was closed using 2-0 nylon the wound was covered with Adaptic orthopedic sponges AB dressing Kerlix and Coban. Patient was extubated taken to the PACU in stable condition.  PLAN OF CARE: Admit to inpatient   PATIENT DISPOSITION:  PACU - hemodynamically stable.   Nadara Mustard, MD 05/31/2012 3:34 PM

## 2012-05-31 NOTE — Anesthesia Postprocedure Evaluation (Signed)
Anesthesia Post Note  Patient: Sydney [name]  Procedure(s) Performed: Procedure(s) (LRB): AMPUTATION RAY (Left)  Anesthesia type: general  Patient location: PACU  Post pain: Pain level controlled  Post assessment: Patient's Cardiovascular Status Stable  Last Vitals:  Filed Vitals:   05/31/12 1549  BP:   Pulse: 76  Temp:   Resp: 16    Post vital signs: Reviewed and stable  Level of consciousness: sedated  Complications: No apparent anesthesia complications

## 2012-06-01 LAB — GLUCOSE, CAPILLARY
Glucose-Capillary: 119 mg/dL — ABNORMAL HIGH (ref 70–99)
Glucose-Capillary: 180 mg/dL — ABNORMAL HIGH (ref 70–99)
Glucose-Capillary: 67 mg/dL — ABNORMAL LOW (ref 70–99)
Glucose-Capillary: 104 mg/dL — ABNORMAL HIGH (ref 70–99)

## 2012-06-01 MED ORDER — NALOXONE HCL 0.4 MG/ML IJ SOLN
INTRAMUSCULAR | Status: AC
  Filled 2012-06-01: qty 1

## 2012-06-01 MED ORDER — NALOXONE HCL 0.4 MG/ML IJ SOLN: 0.2000 mg | Freq: Once | INTRAMUSCULAR | Status: DC

## 2012-06-01 MED ORDER — SODIUM CHLORIDE 0.9 % IV BOLUS (SEPSIS)
250.0000 mL | Freq: Once | INTRAVENOUS | Status: AC
  Administered 2012-06-01: 250 mL via INTRAVENOUS

## 2012-06-01 MED ORDER — ACETAMINOPHEN 325 MG PO TABS
650.0000 mg | ORAL_TABLET | Freq: Four times a day (QID) | ORAL | Status: DC | PRN
  Administered 2012-06-01 – 2012-06-02 (×3): 650 mg via ORAL
  Filled 2012-06-01 (×3): qty 2

## 2012-06-01 NOTE — Discharge Summary (Signed)
Physician Discharge Summary  Patient ID: Sydney [name] MRN: [id number] DOB/AGE: Dec 30, 1920 78 y.o.  Admit date: 05/31/2012 Discharge date: 06/01/2012  Admission Diagnoses: Osteomyelitis left foot great toe  Discharge Diagnoses: Same Active Problems:  * No active hospital problems. *    Discharged Condition: stable  Hospital Course: Patient's hospital course was essentially unremarkable. She underwent a left foot first ray amputation. Postoperatively patient was comfortable she was discharged to home in stable condition followup in the office in 2 weeks.  Consults: None  Significant Diagnostic Studies: labs: Routine labs  Treatments: surgery: See operative note  Discharge Exam: Blood pressure 130/74, pulse 72, temperature 97.9 F (36.6 C), temperature source Oral, resp. rate 18, height 5\' 5"  (1.651 m), weight 59.8 kg (131 lb 13.4 oz), SpO2 99.00%. Incision/Wound: dressing clean dry and intact at time of discharge  Disposition: 01-Home or Self Care  Discharge Orders    Future Orders Please Complete By Expires   Diet - low sodium heart healthy      Call MD / Call 911      Comments:   If you experience chest pain or shortness of breath, CALL 911 and be transported to the hospital emergency room.  If you develope a fever above 101 F, pus (white drainage) or increased drainage or redness at the wound, or calf pain, call your surgeon's office.   Constipation Prevention      Comments:   Drink plenty of fluids.  Prune juice may be helpful.  You may use a stool softener, such as Colace (over the counter) 100 mg twice a day.  Use MiraLax (over the counter) for constipation as needed.   Increase activity slowly as tolerated          Medication List     As of 06/01/2012  9:43 AM    TAKE these medications         calcium carbonate 600 MG Tabs   Commonly known as: OS-CAL   Take 600 mg by mouth daily.      CVS CALCIUM SOFT CHEWS PO   Take 2 tablets by mouth daily.     cyanocobalamin 500 MCG tablet   Take 500 mcg by mouth daily.      doxycycline 100 MG tablet   Commonly known as: VIBRA-TABS   Take 100 mg by mouth 2 (two) times daily.      fish oil-omega-3 fatty acids 1000 MG capsule   Take 1 g by mouth daily.      folic acid 1 MG tablet   Commonly known as: FOLVITE   Take 1 mg by mouth daily.      furosemide 20 MG tablet   Commonly known as: LASIX   Take 20 mg by mouth every other day.      gabapentin 300 MG capsule   Commonly known as: NEURONTIN   Take 300 mg by mouth 2 (two) times daily.      glimepiride 4 MG tablet   Commonly known as: AMARYL   Take 4 mg by mouth daily before breakfast.      ICAPS AREDS FORMULA PO   Take 1 capsule by mouth daily with breakfast.      levothyroxine 50 MCG tablet   Commonly known as: SYNTHROID, LEVOTHROID   Take 50 mcg by mouth daily with breakfast.      lisinopril-hydrochlorothiazide 20-25 MG per tablet   Commonly known as: PRINZIDE,ZESTORETIC   Take 1 tablet by mouth daily with breakfast.  metFORMIN 500 MG tablet   Commonly known as: GLUCOPHAGE   Take 500 mg by mouth daily with breakfast.      METHOTREXATE SODIUM IJ   Inject 0.4 mLs as directed once a week. On tuesday      multivitamin with minerals Tabs   Take 1 tablet by mouth daily.      mupirocin ointment 2 %   Commonly known as: BACTROBAN   Apply 1 application topically as needed. To affected area      nitroGLYCERIN 0.2 mg/hr   Commonly known as: NITRODUR - Dosed in mg/24 hr   Place 1 patch onto the skin daily.      tiotropium 18 MCG inhalation capsule   Commonly known as: SPIRIVA   Place 18 mcg into inhaler and inhale daily with breakfast.      vitamin C 500 MG tablet   Commonly known as: ASCORBIC ACID   Take 500 mg by mouth daily.      Vitamin D3 400 UNITS Caps   Take 1 tablet by mouth daily.      warfarin 5 MG tablet   Commonly known as: COUMADIN   Take 2.5-5 mg by mouth daily. As directed           Follow-up  Information    Follow up with DUDA,MARCUS V, MD. In 2 weeks.   Contact information:   [address] [place] [address] [phone]          Signed: Nadara Mustard 06/01/2012, 9:43 AM

## 2012-06-01 NOTE — Progress Notes (Signed)
Pt BP dropped from 130/79 at 0600 to 85/39 at 1600. MD was called, ordered to D/C Percocet and increase NS from [facility] to 100 mL/hr. Pt status is being monitored.

## 2012-06-01 NOTE — Progress Notes (Signed)
Rapid Response RN was called as pt BP remained low after increase in fluid rate from KVO to 100 ml/hr and D/C of Percocet. Rapid Response RN recommended 0.2 mg Narcan IV and a fluid bolus of . MD was made aware. After administration of Narcan, pt is more awake and alert and BP is climbing, it is now 93/38.

## 2012-06-01 NOTE — Progress Notes (Signed)
Called by primary RN for increased lethargy and low BP 83/38.  Upon arrival to patients room family at bedside and Rn in room.  Patient is reclined in the chair and lethargic unable to stay awake.  Patient had gotten her scheduled neurontin at 0930 and then 1 percocet at 1032 and and then 45 minutes  later received another percocet Dr. Lajoyce Corners paged and notified.  0.2mg  IV narcan given and 250 cc NS bolus given.  Patient is more awake and alert now, BP is 93/38.  RN to call if assistance needed

## 2012-06-01 NOTE — Progress Notes (Signed)
Orthopedic Tech Progress Note Patient Details:  Sydney [name] 11/10/1920 [id number]  Ortho Devices Type of Ortho Device: Postop boot Ortho Device/Splint Location: LEFT POST OP SHOE Ortho Device/Splint Interventions: Application   Cammer, Mickie Bail 06/01/2012, 10:22 AM

## 2012-06-01 NOTE — Evaluation (Addendum)
Physical Therapy Evaluation Patient Details Name: Sydney [name] MRN: [id number] DOB: 03/07/1921 Today's Date: 06/01/2012 Time: 4098-1191 PT Time Calculation (min): 39 min  PT Assessment / Plan / Recommendation Clinical Impression  Pt is 78 y/o female admitted for s/p 1st ray left amputation.  Pt limited due to pain and c/o "loopy headed" due to pain medication.  Pt will benefit from acute PT services to improve overall mobility to prepare for safe d/c home.  Pt needs +2 (A) and needs constant directional cues at this time therefore unsafe to d/c home.  However family and pt are adamant about d/c home.  Question pts ability to to hop up stairs therefore recommend either w/c to get in/out of house or ambulance transport home.  Will continue to assess.  Pt will need HHPT/OT at d/c.    PT Assessment  Patient needs continued PT services    Follow Up Recommendations  Home health PT;Supervision/Assistance - 24 hour (HHOT)    Does the patient have the potential to tolerate intense rehabilitation      Barriers to Discharge None      Equipment Recommendations  None recommended by PT    Recommendations for Other Services OT consult   Frequency Min 5X/week    Precautions / Restrictions Precautions Precautions: Fall Restrictions Weight Bearing Restrictions: Yes LLE Weight Bearing: Non weight bearing   Pertinent Vitals/Pain C/o left LE but does not rate; more c/o neuropathy      Mobility  Bed Mobility Bed Mobility: Supine to Sit;Sitting - Scoot to Edge of Bed Supine to Sit: 1: +2 Total assist;With rails Supine to Sit: Patient Percentage: 60% Sitting - Scoot to Edge of Bed: 1: +2 Total assist Sitting - Scoot to Edge of Bed: Patient Percentage: 50% Details for Bed Mobility Assistance: +2 (A) with left LE OOB and elevate LE OOB with cues for proper technique Transfers Transfers: Sit to Stand;Stand to Sit;Stand Pivot Transfers Sit to Stand: 1: +2 Total assist;From bed Sit to Stand:  Patient Percentage: 60% Stand to Sit: 1: +2 Total assist;To chair/3-in-1 Stand to Sit: Patient Percentage: 60% Stand Pivot Transfers: 1: +2 Total assist;From elevated surface;With armrests Stand Pivot Transfers: Patient Percentage: 60% Details for Transfer Assistance: +2 (A) to initiate transfer and maintain balance.  Pt tends to lean posterior during stand -> sit transfer.  Pt unable to maintain NWB left LE during transfer and needs (A) to maintain balance throughout. Ambulation/Gait Ambulation/Gait Assistance: Not tested (comment) Wheelchair Mobility Wheelchair Mobility: No    Shoulder Instructions     Exercises     PT Diagnosis: Difficulty walking;Abnormality of gait;Generalized weakness;Acute pain  PT Problem List: Decreased strength;Decreased range of motion;Decreased activity tolerance;Decreased balance;Decreased mobility;Decreased knowledge of use of DME;Pain PT Treatment Interventions: DME instruction;Gait training;Stair training;Functional mobility training;Therapeutic activities;Therapeutic exercise;Balance training;Patient/family education   PT Goals Acute Rehab PT Goals PT Goal Formulation: With patient Time For Goal Achievement: 06/08/12 Potential to Achieve Goals: Good Pt will go Supine/Side to Sit: with min assist PT Goal: Supine/Side to Sit - Progress: Goal set today Pt will go Sit to Supine/Side: with min assist PT Goal: Sit to Supine/Side - Progress: Goal set today Pt will go Sit to Stand: with min assist PT Goal: Sit to Stand - Progress: Goal set today Pt will go Stand to Sit: with min assist PT Goal: Stand to Sit - Progress: Goal set today Pt will Transfer Bed to Chair/Chair to Bed: with mod assist PT Transfer Goal: Bed to Chair/Chair to Bed - Progress:  Goal set today Pt will Stand: with min assist;1 - 2 min PT Goal: Stand - Progress: Goal set today Pt will Ambulate: 1 - 15 feet;with +2 total assist;with rolling walker PT Goal: Ambulate - Progress: Goal set  today  Visit Information  Last PT Received On: 06/01/12 Assistance Needed: +2    Subjective Data  Subjective: "This medicine is making my loopy headed." Patient Stated Goal: To eventually go home   Prior Functioning  Home Living Lives With: Family Available Help at Discharge: Family Type of Home: House Home Access: Stairs to enter Secretary/administrator of Steps: 4 Entrance Stairs-Rails: Right;Left;Can reach both Home Layout: One level Bathroom Shower/Tub: Walk-in Contractor: Handicapped height Bathroom Accessibility: Yes Prior Function Level of Independence: Independent with assistive device(s) (needs to use can or walker) Able to Take Stairs?: Yes Driving: No Vocation: Retired Musician: No difficulties    Cognition  Overall Cognitive Status: Impaired Area of Impairment: Problem solving Arousal/Alertness: Lethargic Orientation Level: Appears intact for tasks assessed Behavior During Session: [facility] for tasks performed Problem Solving: Pt slow to process    Extremity/Trunk Assessment Right Lower Extremity Assessment RLE ROM/Strength/Tone: Within functional levels (toe amputation ) RLE Sensation: History of peripheral neuropathy Left Lower Extremity Assessment LLE ROM/Strength/Tone: Unable to fully assess;Due to pain;Due to precautions LLE Sensation: History of peripheral neuropathy   Balance Balance Balance Assessed: Yes Static Sitting Balance Static Sitting - Balance Support: Feet supported Static Sitting - Level of Assistance: 3: Mod assist Static Sitting - Comment/# of Minutes: Initial mod (A) to min (A) to maintain balance and prevent posterior lean  End of Session PT - End of Session Equipment Utilized During Treatment: Gait belt (post op shoe bilateral LE) Activity Tolerance: Patient limited by pain;Treatment limited secondary to medication Patient left: in chair;with call bell/phone within reach;with family/visitor  present Nurse Communication: Precautions;Weight bearing status  GP Functional Limitation: Mobility: Walking and moving around Mobility: Walking and Moving Around Current Status [phone]): At least 40 percent but less than 60 percent impaired, limited or restricted Mobility: Walking and Moving Around Goal Status [phone]): At least 1 percent but less than 20 percent impaired, limited or restricted   Ritik Stavola 06/01/2012, 3:37 PM [place], PT DPT [phone]

## 2012-06-01 NOTE — Progress Notes (Signed)
Pt 1700 BS was 67. She was given 8oz of whole milk and ate a breadstick and lasagna for dinner. BS increased to 70. Pt was given graham crackers and 4oz orange juice. BS increased to 104.

## 2012-06-02 LAB — PROTIME-INR
INR: 4.6 — ABNORMAL HIGH (ref 0.00–1.49)
Prothrombin Time: 40.6 seconds — ABNORMAL HIGH (ref 11.6–15.2)

## 2012-06-02 LAB — GLUCOSE, CAPILLARY
Glucose-Capillary: 217 mg/dL — ABNORMAL HIGH (ref 70–99)
Glucose-Capillary: 119 mg/dL — ABNORMAL HIGH (ref 70–99)

## 2012-06-02 NOTE — Progress Notes (Signed)
D/C instructions reviewed with patient and daughter. No rx given. (APAP for pain management). hh services arranged with [facility]. No hh equipment needed. VSS. B/p low this am- MD instructed to hold b/p meds until tomorrow. INR 4.6 this am- md instructed to hold coumadin until rechecked by prescribing md- preferrably Monday or Tuesday. Pt and daughter verbalized understanding. Pt d/c'ed via wheelchair in stable condition

## 2012-06-02 NOTE — Care Management Note (Signed)
    Page 1 of 1   06/02/2012     8:16:35 PM   CARE MANAGEMENT NOTE 06/02/2012  Patient:  Sydney [name], Sydney [name]   Account Number:  [id number]  Date Initiated:  06/02/2012  Documentation initiated by:  [facility]  Subjective/Objective Assessment:     Action/Plan:   Anticipated DC Date:  06/02/2012   Anticipated DC Plan:  HOME W HOME HEALTH SERVICES      DC Planning Services  CM consult      [facility] Choice  HOME HEALTH   Choice offered to / List presented to:  C-1 Patient        HH arranged  HH-2 PT  HH-3 OT      [facility] agency  [facility]   Status of service:  Completed, signed off Medicare Important Message given?   (If response is "NO", the following Medicare IM given date fields will be blank) Date Medicare IM given:   Date Additional Medicare IM given:    Discharge Disposition:  HOME W HOME HEALTH SERVICES  Per UR Regulation:    If discussed at Long Length of Stay Meetings, dates discussed:    Comments:  06/02/2012 2000 NCM will follow up with [facility] on 10/21 for soc date for [facility]. Isidoro Donning RN CCM Case Mgmt phone [phone]

## 2012-06-02 NOTE — Discharge Summary (Signed)
   discharge yesterday postponed due to 2 difficulty from ambulation with therapy as well as subsequent hypoxia do to her narcotic pain medicine. Patient is alert and oriented this morning no complaints of pain we will allow her to discharge to home after physical therapy plan to followup in the office in one week.

## 2012-06-02 NOTE — Progress Notes (Signed)
Physical Therapy Treatment Patient Details Name: Sydney [name] MRN: [id number] DOB: [birth year] Today's Date: 06/02/2012 Time: 1000-1055 PT Time Calculation (min): 55 min  PT Assessment / Plan / Recommendation Comments on Treatment Session  Pt cont's to require +2 total (A) for OOB mobility at this date.  Majority of session focused on caregiver education for transfers.  Pt's daughter & son-in-law (A)'d 2x's.  Pt, daughter, & son-in-law, state they feel comfortable with pt returning home today despite still needing increased (A).        Follow Up Recommendations  Home health PT;Supervision/Assistance - 24 hour     Does the patient have the potential to tolerate intense rehabilitation     Barriers to Discharge        Equipment Recommendations  None recommended by PT    Recommendations for Other Services    Frequency Min 5X/week   Plan Discharge plan remains appropriate    Precautions / Restrictions Precautions Precautions: Fall Restrictions Weight Bearing Restrictions: Yes LLE Weight Bearing: Non weight bearing       Mobility  Bed Mobility Bed Mobility: Supine to Sit;Sitting - Scoot to Edge of Bed Supine to Sit: 4: Min assist;HOB flat Sitting - Scoot to [place] of Bed: 4: Min assist Details for Bed Mobility Assistance: Cues for sequencing & technique.  (A) to lift shoulders/trunk to sitting upright.  Minor (A) with use of draw pad to bring hips closer to EOB.   Transfers Transfers: Sit to Stand;Stand to Sit;Stand Pivot Transfers Sit to Stand: 1: +2 Total assist;With upper extremity assist;From bed;From chair/3-in-1;With armrests Sit to Stand: Patient Percentage: 50% Stand to Sit: 1: +2 Total assist;With upper extremity assist;With armrests;To bed;To chair/3-in-1 Stand to Sit: Patient Percentage: 50% Stand Pivot Transfers: 1: +2 Total assist Stand Pivot Transfers: Patient Percentage: 50% Details for Transfer Assistance: Cont's to require +2 total (A).  Max directional cues for  sequencing & technique.  Performed SPT 3x's with pt's daughter & son-in-law (A)ing with last 2 trials.  Educated daughter & son-in-law on proper handling technique & body position.  Pt leans posteriorly while flexed at hips & has difficulty maintaining adherence to weight bearing restriction Ambulation/Gait Ambulation/Gait Assistance: Not tested (comment)     PT Goals Acute Rehab PT Goals Time For Goal Achievement: 06/08/12 Potential to Achieve Goals: Good Pt will go Supine/Side to Sit: with min assist PT Goal: Supine/Side to Sit - Progress: Met Pt will go Sit to Supine/Side: with min assist Pt will go Sit to Stand: with min assist PT Goal: Sit to Stand - Progress: Not met Pt will go Stand to Sit: with min assist PT Goal: Stand to Sit - Progress: Not met Pt will Transfer Bed to Chair/Chair to Bed: with mod assist PT Transfer Goal: Bed to Chair/Chair to Bed - Progress: Not met Pt will Stand: with min assist;1 - 2 min PT Goal: Stand - Progress: Not met Pt will Ambulate: 1 - 15 feet;with +2 total assist;with rolling walker  Visit Information  Last PT Received On: 06/02/12 Assistance Needed: +2    Subjective Data  Subjective: "they gave me some stuff yesterday that made me loopy" Patient Stated Goal: To eventually go home   Cognition  Overall Cognitive Status: Impaired Area of Impairment: Problem solving Arousal/Alertness: Awake/alert Orientation Level: Appears intact for tasks assessed Behavior During Session: [facility] for tasks performed Problem Solving: Pt requires max directional cues for OOB activity & slow to process Cognition - Other Comments: Daughter feels cognition was impaired due to  narcotics.      Balance     End of Session PT - End of Session Equipment Utilized During Treatment: Gait belt Activity Tolerance: Patient tolerated treatment well Patient left: in chair;with call bell/phone within reach;with family/visitor present Nurse Communication: Mobility status      Verdell Face, [place] [phone] 06/02/2012

## 2012-06-03 DIAGNOSIS — I10 Essential (primary) hypertension: Secondary | ICD-10-CM | POA: Diagnosis not present

## 2012-06-03 DIAGNOSIS — E1169 Type 2 diabetes mellitus with other specified complication: Secondary | ICD-10-CM | POA: Diagnosis not present

## 2012-06-03 DIAGNOSIS — L97509 Non-pressure chronic ulcer of other part of unspecified foot with unspecified severity: Secondary | ICD-10-CM | POA: Diagnosis not present

## 2012-06-03 DIAGNOSIS — M069 Rheumatoid arthritis, unspecified: Secondary | ICD-10-CM | POA: Diagnosis not present

## 2012-06-03 DIAGNOSIS — J441 Chronic obstructive pulmonary disease with (acute) exacerbation: Secondary | ICD-10-CM | POA: Diagnosis not present

## 2012-06-03 LAB — GLUCOSE, CAPILLARY: Glucose-Capillary: 54 mg/dL — ABNORMAL LOW (ref 70–99)

## 2012-06-03 NOTE — Progress Notes (Signed)
   CARE MANAGEMENT NOTE 06/03/2012  Patient:  Sydney [name], Sydney [name]   Account Number:  [id number]  Date Initiated:  06/02/2012  Documentation initiated by:  [facility]  Subjective/Objective Assessment:     Action/Plan:   Anticipated DC Date:  06/02/2012   Anticipated DC Plan:  HOME W HOME HEALTH SERVICES      DC Planning Services  CM consult      [facility] Choice  HOME HEALTH   Choice offered to / List presented to:  C-1 Patient        HH arranged  HH-2 PT  HH-3 OT      [facility] agency  [facility]   Status of service:  Completed, signed off Medicare Important Message given?   (If response is "NO", the following Medicare IM given date fields will be blank) Date Medicare IM given:   Date Additional Medicare IM given:    Discharge Disposition:  HOME W HOME HEALTH SERVICES  Per UR Regulation:    If discussed at Long Length of Stay Meetings, dates discussed:    Comments:  06/03/2012 0930 NCM call [facility] for referral. State to fax orders to office. NCM spoke to [place] and pt is active with [facility] RN. NCM explained HH PT/OT was ordered. Office will need to call to get resumption of care order for [facility] RN. Pt will need continue INR lab draws. Faxed order for HHPT/OT to [place]. Isidoro Donning RN CCM Case Mgmt phone [phone]  06/02/2012 2000 NCM will follow up with [facility] on 10/21 for soc date for [facility]. Isidoro Donning RN CCM Case Mgmt phone [phone]

## 2012-06-04 ENCOUNTER — Other Ambulatory Visit: Payer: Self-pay | Admitting: *Deleted

## 2012-06-04 ENCOUNTER — Encounter (HOSPITAL_COMMUNITY): Payer: Self-pay | Admitting: Orthopedic Surgery

## 2012-06-04 DIAGNOSIS — J441 Chronic obstructive pulmonary disease with (acute) exacerbation: Secondary | ICD-10-CM | POA: Diagnosis not present

## 2012-06-04 DIAGNOSIS — I10 Essential (primary) hypertension: Secondary | ICD-10-CM | POA: Diagnosis not present

## 2012-06-04 DIAGNOSIS — L97509 Non-pressure chronic ulcer of other part of unspecified foot with unspecified severity: Secondary | ICD-10-CM | POA: Diagnosis not present

## 2012-06-04 DIAGNOSIS — M069 Rheumatoid arthritis, unspecified: Secondary | ICD-10-CM | POA: Diagnosis not present

## 2012-06-04 DIAGNOSIS — E1169 Type 2 diabetes mellitus with other specified complication: Secondary | ICD-10-CM | POA: Diagnosis not present

## 2012-06-04 MED ORDER — FUROSEMIDE 20 MG PO TABS: 20.0000 mg | ORAL_TABLET | ORAL | Status: DC

## 2012-06-05 ENCOUNTER — Encounter (HOSPITAL_COMMUNITY): Payer: Self-pay

## 2012-06-05 DIAGNOSIS — M069 Rheumatoid arthritis, unspecified: Secondary | ICD-10-CM | POA: Diagnosis not present

## 2012-06-05 DIAGNOSIS — I10 Essential (primary) hypertension: Secondary | ICD-10-CM | POA: Diagnosis not present

## 2012-06-05 DIAGNOSIS — J441 Chronic obstructive pulmonary disease with (acute) exacerbation: Secondary | ICD-10-CM | POA: Diagnosis not present

## 2012-06-05 DIAGNOSIS — E1169 Type 2 diabetes mellitus with other specified complication: Secondary | ICD-10-CM | POA: Diagnosis not present

## 2012-06-05 DIAGNOSIS — L97509 Non-pressure chronic ulcer of other part of unspecified foot with unspecified severity: Secondary | ICD-10-CM | POA: Diagnosis not present

## 2012-06-06 DIAGNOSIS — I10 Essential (primary) hypertension: Secondary | ICD-10-CM | POA: Diagnosis not present

## 2012-06-06 DIAGNOSIS — J441 Chronic obstructive pulmonary disease with (acute) exacerbation: Secondary | ICD-10-CM | POA: Diagnosis not present

## 2012-06-06 DIAGNOSIS — L97509 Non-pressure chronic ulcer of other part of unspecified foot with unspecified severity: Secondary | ICD-10-CM | POA: Diagnosis not present

## 2012-06-06 DIAGNOSIS — E1169 Type 2 diabetes mellitus with other specified complication: Secondary | ICD-10-CM | POA: Diagnosis not present

## 2012-06-06 DIAGNOSIS — M069 Rheumatoid arthritis, unspecified: Secondary | ICD-10-CM | POA: Diagnosis not present

## 2012-06-06 NOTE — Addendum Note (Signed)
Addended by: Marrion Coy L on: 06/06/2012 12:10 PM   Modules accepted: Orders

## 2012-06-07 DIAGNOSIS — J441 Chronic obstructive pulmonary disease with (acute) exacerbation: Secondary | ICD-10-CM | POA: Diagnosis not present

## 2012-06-07 DIAGNOSIS — L97509 Non-pressure chronic ulcer of other part of unspecified foot with unspecified severity: Secondary | ICD-10-CM | POA: Diagnosis not present

## 2012-06-07 DIAGNOSIS — E1169 Type 2 diabetes mellitus with other specified complication: Secondary | ICD-10-CM | POA: Diagnosis not present

## 2012-06-07 DIAGNOSIS — I10 Essential (primary) hypertension: Secondary | ICD-10-CM | POA: Diagnosis not present

## 2012-06-07 DIAGNOSIS — M069 Rheumatoid arthritis, unspecified: Secondary | ICD-10-CM | POA: Diagnosis not present

## 2012-06-10 DIAGNOSIS — J441 Chronic obstructive pulmonary disease with (acute) exacerbation: Secondary | ICD-10-CM | POA: Diagnosis not present

## 2012-06-10 DIAGNOSIS — I10 Essential (primary) hypertension: Secondary | ICD-10-CM | POA: Diagnosis not present

## 2012-06-10 DIAGNOSIS — M069 Rheumatoid arthritis, unspecified: Secondary | ICD-10-CM | POA: Diagnosis not present

## 2012-06-10 DIAGNOSIS — L97509 Non-pressure chronic ulcer of other part of unspecified foot with unspecified severity: Secondary | ICD-10-CM | POA: Diagnosis not present

## 2012-06-10 DIAGNOSIS — E1169 Type 2 diabetes mellitus with other specified complication: Secondary | ICD-10-CM | POA: Diagnosis not present

## 2012-06-11 DIAGNOSIS — M069 Rheumatoid arthritis, unspecified: Secondary | ICD-10-CM | POA: Diagnosis not present

## 2012-06-11 DIAGNOSIS — I10 Essential (primary) hypertension: Secondary | ICD-10-CM | POA: Diagnosis not present

## 2012-06-11 DIAGNOSIS — L97509 Non-pressure chronic ulcer of other part of unspecified foot with unspecified severity: Secondary | ICD-10-CM | POA: Diagnosis not present

## 2012-06-11 DIAGNOSIS — E1169 Type 2 diabetes mellitus with other specified complication: Secondary | ICD-10-CM | POA: Diagnosis not present

## 2012-06-11 DIAGNOSIS — J441 Chronic obstructive pulmonary disease with (acute) exacerbation: Secondary | ICD-10-CM | POA: Diagnosis not present

## 2012-06-13 DIAGNOSIS — E1169 Type 2 diabetes mellitus with other specified complication: Secondary | ICD-10-CM | POA: Diagnosis not present

## 2012-06-13 DIAGNOSIS — J441 Chronic obstructive pulmonary disease with (acute) exacerbation: Secondary | ICD-10-CM | POA: Diagnosis not present

## 2012-06-13 DIAGNOSIS — I10 Essential (primary) hypertension: Secondary | ICD-10-CM | POA: Diagnosis not present

## 2012-06-13 DIAGNOSIS — L97509 Non-pressure chronic ulcer of other part of unspecified foot with unspecified severity: Secondary | ICD-10-CM | POA: Diagnosis not present

## 2012-06-13 DIAGNOSIS — M069 Rheumatoid arthritis, unspecified: Secondary | ICD-10-CM | POA: Diagnosis not present

## 2012-06-17 DIAGNOSIS — J441 Chronic obstructive pulmonary disease with (acute) exacerbation: Secondary | ICD-10-CM | POA: Diagnosis not present

## 2012-06-17 DIAGNOSIS — E1169 Type 2 diabetes mellitus with other specified complication: Secondary | ICD-10-CM | POA: Diagnosis not present

## 2012-06-17 DIAGNOSIS — M069 Rheumatoid arthritis, unspecified: Secondary | ICD-10-CM | POA: Diagnosis not present

## 2012-06-17 DIAGNOSIS — I10 Essential (primary) hypertension: Secondary | ICD-10-CM | POA: Diagnosis not present

## 2012-06-17 DIAGNOSIS — L97509 Non-pressure chronic ulcer of other part of unspecified foot with unspecified severity: Secondary | ICD-10-CM | POA: Diagnosis not present

## 2012-06-18 DIAGNOSIS — I10 Essential (primary) hypertension: Secondary | ICD-10-CM | POA: Diagnosis not present

## 2012-06-18 DIAGNOSIS — L97509 Non-pressure chronic ulcer of other part of unspecified foot with unspecified severity: Secondary | ICD-10-CM | POA: Diagnosis not present

## 2012-06-18 DIAGNOSIS — E1169 Type 2 diabetes mellitus with other specified complication: Secondary | ICD-10-CM | POA: Diagnosis not present

## 2012-06-18 DIAGNOSIS — J441 Chronic obstructive pulmonary disease with (acute) exacerbation: Secondary | ICD-10-CM | POA: Diagnosis not present

## 2012-06-18 DIAGNOSIS — M069 Rheumatoid arthritis, unspecified: Secondary | ICD-10-CM | POA: Diagnosis not present

## 2012-06-20 ENCOUNTER — Other Ambulatory Visit (HOSPITAL_COMMUNITY): Payer: Self-pay | Admitting: Orthopedic Surgery

## 2012-06-20 ENCOUNTER — Encounter (HOSPITAL_COMMUNITY): Payer: Self-pay | Admitting: *Deleted

## 2012-06-20 DIAGNOSIS — I10 Essential (primary) hypertension: Secondary | ICD-10-CM | POA: Diagnosis not present

## 2012-06-20 DIAGNOSIS — J441 Chronic obstructive pulmonary disease with (acute) exacerbation: Secondary | ICD-10-CM | POA: Diagnosis not present

## 2012-06-20 DIAGNOSIS — E1169 Type 2 diabetes mellitus with other specified complication: Secondary | ICD-10-CM | POA: Diagnosis not present

## 2012-06-20 DIAGNOSIS — M069 Rheumatoid arthritis, unspecified: Secondary | ICD-10-CM | POA: Diagnosis not present

## 2012-06-20 DIAGNOSIS — L97509 Non-pressure chronic ulcer of other part of unspecified foot with unspecified severity: Secondary | ICD-10-CM | POA: Diagnosis not present

## 2012-06-20 MED ORDER — CEFAZOLIN SODIUM-DEXTROSE 2-3 GM-% IV SOLR
2.0000 g | INTRAVENOUS | Status: AC
  Administered 2012-06-21: 2 g via INTRAVENOUS
  Filled 2012-06-20: qty 50

## 2012-06-21 ENCOUNTER — Inpatient Hospital Stay (HOSPITAL_COMMUNITY): Payer: Medicare Other | Admitting: Anesthesiology

## 2012-06-21 ENCOUNTER — Encounter (HOSPITAL_COMMUNITY): Payer: Self-pay | Admitting: Anesthesiology

## 2012-06-21 ENCOUNTER — Encounter (HOSPITAL_COMMUNITY): Payer: Self-pay | Admitting: *Deleted

## 2012-06-21 ENCOUNTER — Encounter (HOSPITAL_COMMUNITY)
Admission: RE | Disposition: A | Discharge status: Rehabilitation Facility | Payer: Self-pay | Source: Ambulatory Visit | Attending: Orthopedic Surgery

## 2012-06-21 ENCOUNTER — Inpatient Hospital Stay (HOSPITAL_COMMUNITY)
Admission: RE | Admit: 2012-06-21 | Discharge: 2012-06-24 | DRG: 240 | Disposition: A | Discharge status: Rehabilitation Facility | Payer: Medicare Other | Source: Ambulatory Visit | Attending: Orthopedic Surgery | Admitting: Orthopedic Surgery

## 2012-06-21 DIAGNOSIS — E039 Hypothyroidism, unspecified: Secondary | ICD-10-CM | POA: Diagnosis present

## 2012-06-21 DIAGNOSIS — M069 Rheumatoid arthritis, unspecified: Secondary | ICD-10-CM | POA: Diagnosis present

## 2012-06-21 DIAGNOSIS — E7800 Pure hypercholesterolemia, unspecified: Secondary | ICD-10-CM | POA: Diagnosis present

## 2012-06-21 DIAGNOSIS — I10 Essential (primary) hypertension: Secondary | ICD-10-CM | POA: Diagnosis not present

## 2012-06-21 DIAGNOSIS — Z79899 Other long term (current) drug therapy: Secondary | ICD-10-CM

## 2012-06-21 DIAGNOSIS — M86669 Other chronic osteomyelitis, unspecified tibia and fibula: Secondary | ICD-10-CM | POA: Diagnosis not present

## 2012-06-21 DIAGNOSIS — I4891 Unspecified atrial fibrillation: Secondary | ICD-10-CM | POA: Diagnosis not present

## 2012-06-21 DIAGNOSIS — J438 Other emphysema: Secondary | ICD-10-CM | POA: Diagnosis present

## 2012-06-21 DIAGNOSIS — E1159 Type 2 diabetes mellitus with other circulatory complications: Principal | ICD-10-CM | POA: Diagnosis present

## 2012-06-21 DIAGNOSIS — I96 Gangrene, not elsewhere classified: Secondary | ICD-10-CM | POA: Diagnosis not present

## 2012-06-21 DIAGNOSIS — I70269 Atherosclerosis of native arteries of extremities with gangrene, unspecified extremity: Secondary | ICD-10-CM | POA: Diagnosis not present

## 2012-06-21 DIAGNOSIS — H353190 Nonexudative age-related macular degeneration, unspecified eye, stage unspecified: Secondary | ICD-10-CM | POA: Diagnosis present

## 2012-06-21 DIAGNOSIS — Z87891 Personal history of nicotine dependence: Secondary | ICD-10-CM

## 2012-06-21 DIAGNOSIS — L97409 Non-pressure chronic ulcer of unspecified heel and midfoot with unspecified severity: Secondary | ICD-10-CM | POA: Diagnosis not present

## 2012-06-21 DIAGNOSIS — Z96649 Presence of unspecified artificial hip joint: Secondary | ICD-10-CM

## 2012-06-21 DIAGNOSIS — E119 Type 2 diabetes mellitus without complications: Secondary | ICD-10-CM | POA: Diagnosis not present

## 2012-06-21 DIAGNOSIS — L97909 Non-pressure chronic ulcer of unspecified part of unspecified lower leg with unspecified severity: Secondary | ICD-10-CM | POA: Diagnosis not present

## 2012-06-21 DIAGNOSIS — D62 Acute posthemorrhagic anemia: Secondary | ICD-10-CM | POA: Diagnosis not present

## 2012-06-21 DIAGNOSIS — J449 Chronic obstructive pulmonary disease, unspecified: Secondary | ICD-10-CM | POA: Diagnosis not present

## 2012-06-21 DIAGNOSIS — S88119A Complete traumatic amputation at level between knee and ankle, unspecified lower leg, initial encounter: Secondary | ICD-10-CM | POA: Diagnosis not present

## 2012-06-21 DIAGNOSIS — Z8249 Family history of ischemic heart disease and other diseases of the circulatory system: Secondary | ICD-10-CM | POA: Diagnosis not present

## 2012-06-21 DIAGNOSIS — Z7901 Long term (current) use of anticoagulants: Secondary | ICD-10-CM | POA: Diagnosis not present

## 2012-06-21 DIAGNOSIS — Z823 Family history of stroke: Secondary | ICD-10-CM | POA: Diagnosis not present

## 2012-06-21 DIAGNOSIS — Z5189 Encounter for other specified aftercare: Secondary | ICD-10-CM | POA: Diagnosis not present

## 2012-06-21 DIAGNOSIS — R0602 Shortness of breath: Secondary | ICD-10-CM | POA: Diagnosis not present

## 2012-06-21 DIAGNOSIS — I739 Peripheral vascular disease, unspecified: Secondary | ICD-10-CM | POA: Diagnosis not present

## 2012-06-21 HISTORY — PX: AMPUTATION: SHX166

## 2012-06-21 LAB — CBC
Hemoglobin: 8.3 g/dL — ABNORMAL LOW (ref 12.0–15.0)
MCH: 27.6 pg (ref 26.0–34.0)
MCHC: 31.1 g/dL (ref 30.0–36.0)
Platelets: 242 10*3/uL (ref 150–400)

## 2012-06-21 LAB — GLUCOSE, CAPILLARY: Glucose-Capillary: 94 mg/dL (ref 70–99)

## 2012-06-21 LAB — COMPREHENSIVE METABOLIC PANEL
ALT: 10 U/L (ref 0–35)
Calcium: 9.7 mg/dL (ref 8.4–10.5)
GFR calc Af Amer: 66 mL/min — ABNORMAL LOW (ref >90)
Glucose, Bld: 109 mg/dL — ABNORMAL HIGH (ref 70–99)
Sodium: 137 mEq/L (ref 135–145)
Total Protein: 6.8 g/dL (ref 6.0–8.3)

## 2012-06-21 SURGERY — AMPUTATION BELOW KNEE
Anesthesia: General | Site: Leg Lower | Laterality: Left | Wound class: Dirty or Infected

## 2012-06-21 MED ORDER — METOCLOPRAMIDE HCL 5 MG/ML IJ SOLN: 5.0000 mg | Freq: Three times a day (TID) | INTRAMUSCULAR | Status: DC | PRN

## 2012-06-21 MED ORDER — LACTATED RINGERS IV SOLN
INTRAVENOUS | Status: DC
  Administered 2012-06-21: 13:00:00 via INTRAVENOUS

## 2012-06-21 MED ORDER — WARFARIN SODIUM 5 MG PO TABS
5.0000 mg | ORAL_TABLET | Freq: Once | ORAL | Status: AC
  Administered 2012-06-21: 5 mg via ORAL
  Filled 2012-06-21: qty 1

## 2012-06-21 MED ORDER — LIDOCAINE HCL 1 % IJ SOLN
INTRAMUSCULAR | Status: DC | PRN
  Administered 2012-06-21: 20 mg via INTRADERMAL

## 2012-06-21 MED ORDER — FOLIC ACID 1 MG PO TABS
1.0000 mg | ORAL_TABLET | Freq: Every day | ORAL | Status: DC
  Administered 2012-06-22 – 2012-06-24 (×3): 1 mg via ORAL
  Filled 2012-06-21 (×4): qty 1

## 2012-06-21 MED ORDER — PROPOFOL 10 MG/ML IV BOLUS
INTRAVENOUS | Status: DC | PRN
  Administered 2012-06-21: 110 mg via INTRAVENOUS

## 2012-06-21 MED ORDER — LACTATED RINGERS IV SOLN
INTRAVENOUS | Status: DC | PRN
  Administered 2012-06-21: 14:00:00 via INTRAVENOUS

## 2012-06-21 MED ORDER — HYDROMORPHONE HCL PF 1 MG/ML IJ SOLN
0.2500 mg | INTRAMUSCULAR | Status: DC | PRN
  Administered 2012-06-21 (×2): 0.5 mg via INTRAVENOUS

## 2012-06-21 MED ORDER — HYDROMORPHONE HCL PF 1 MG/ML IJ SOLN: 0.5000 mg | INTRAMUSCULAR | Status: DC | PRN

## 2012-06-21 MED ORDER — ONDANSETRON HCL 4 MG/2ML IJ SOLN
INTRAMUSCULAR | Status: DC | PRN
  Administered 2012-06-21: 4 mg via INTRAVENOUS

## 2012-06-21 MED ORDER — OMEGA-3 FATTY ACIDS 1000 MG PO CAPS
1.0000 g | ORAL_CAPSULE | Freq: Every day | ORAL | Status: DC
  Filled 2012-06-21: qty 1

## 2012-06-21 MED ORDER — LISINOPRIL 20 MG PO TABS
20.0000 mg | ORAL_TABLET | Freq: Every day | ORAL | Status: DC
  Administered 2012-06-21 – 2012-06-24 (×4): 20 mg via ORAL
  Filled 2012-06-21 (×5): qty 1

## 2012-06-21 MED ORDER — OXYCODONE-ACETAMINOPHEN 5-325 MG PO TABS: 1.0000 | ORAL_TABLET | ORAL | Status: DC | PRN

## 2012-06-21 MED ORDER — ADULT MULTIVITAMIN W/MINERALS CH
1.0000 | ORAL_TABLET | Freq: Every day | ORAL | Status: DC
  Administered 2012-06-22 – 2012-06-24 (×3): 1 via ORAL
  Filled 2012-06-21 (×4): qty 1

## 2012-06-21 MED ORDER — ACETAMINOPHEN 10 MG/ML IV SOLN
INTRAVENOUS | Status: AC
  Filled 2012-06-21: qty 100

## 2012-06-21 MED ORDER — FENTANYL CITRATE 0.05 MG/ML IJ SOLN
INTRAMUSCULAR | Status: DC | PRN
  Administered 2012-06-21 (×5): 50 ug via INTRAVENOUS

## 2012-06-21 MED ORDER — ACETAMINOPHEN 10 MG/ML IV SOLN
1000.0000 mg | Freq: Once | INTRAVENOUS | Status: AC | PRN
  Administered 2012-06-21: 1000 mg via INTRAVENOUS

## 2012-06-21 MED ORDER — HYDROCHLOROTHIAZIDE 25 MG PO TABS
25.0000 mg | ORAL_TABLET | Freq: Every day | ORAL | Status: DC
  Administered 2012-06-21 – 2012-06-24 (×4): 25 mg via ORAL
  Filled 2012-06-21 (×5): qty 1

## 2012-06-21 MED ORDER — TIOTROPIUM BROMIDE MONOHYDRATE 18 MCG IN CAPS
18.0000 ug | ORAL_CAPSULE | Freq: Every day | RESPIRATORY_TRACT | Status: DC
  Administered 2012-06-22 – 2012-06-24 (×3): 18 ug via RESPIRATORY_TRACT
  Filled 2012-06-21: qty 5

## 2012-06-21 MED ORDER — LEVOTHYROXINE SODIUM 50 MCG PO TABS
50.0000 ug | ORAL_TABLET | Freq: Every day | ORAL | Status: DC
  Administered 2012-06-22 – 2012-06-24 (×3): 50 ug via ORAL
  Filled 2012-06-21 (×4): qty 1

## 2012-06-21 MED ORDER — METHOCARBAMOL 500 MG PO TABS
500.0000 mg | ORAL_TABLET | Freq: Four times a day (QID) | ORAL | Status: DC | PRN
  Administered 2012-06-22 – 2012-06-23 (×4): 500 mg via ORAL
  Filled 2012-06-21 (×6): qty 1

## 2012-06-21 MED ORDER — METFORMIN HCL 500 MG PO TABS
500.0000 mg | ORAL_TABLET | Freq: Every day | ORAL | Status: DC
  Administered 2012-06-22 – 2012-06-24 (×3): 500 mg via ORAL
  Filled 2012-06-21 (×4): qty 1

## 2012-06-21 MED ORDER — FUROSEMIDE 20 MG PO TABS
20.0000 mg | ORAL_TABLET | ORAL | Status: DC
  Administered 2012-06-22 – 2012-06-24 (×2): 20 mg via ORAL
  Filled 2012-06-21 (×2): qty 1

## 2012-06-21 MED ORDER — ONDANSETRON HCL 4 MG/2ML IJ SOLN: 4.0000 mg | Freq: Four times a day (QID) | INTRAMUSCULAR | Status: DC | PRN

## 2012-06-21 MED ORDER — ACETAMINOPHEN 325 MG PO TABS
650.0000 mg | ORAL_TABLET | ORAL | Status: DC | PRN
  Administered 2012-06-21 – 2012-06-22 (×4): 650 mg via ORAL
  Administered 2012-06-22: 325 mg via ORAL
  Administered 2012-06-22 – 2012-06-24 (×6): 650 mg via ORAL
  Filled 2012-06-21 (×12): qty 2

## 2012-06-21 MED ORDER — CEFAZOLIN SODIUM 1-5 GM-% IV SOLN
1.0000 g | Freq: Four times a day (QID) | INTRAVENOUS | Status: AC
  Administered 2012-06-21 – 2012-06-22 (×3): 1 g via INTRAVENOUS
  Filled 2012-06-21 (×3): qty 50

## 2012-06-21 MED ORDER — ONDANSETRON HCL 4 MG/2ML IJ SOLN: 4.0000 mg | Freq: Once | INTRAMUSCULAR | Status: DC | PRN

## 2012-06-21 MED ORDER — METHOCARBAMOL 100 MG/ML IJ SOLN
500.0000 mg | Freq: Four times a day (QID) | INTRAVENOUS | Status: DC | PRN
  Administered 2012-06-21: 500 mg via INTRAVENOUS
  Filled 2012-06-21: qty 5

## 2012-06-21 MED ORDER — FUROSEMIDE 20 MG PO TABS
20.0000 mg | ORAL_TABLET | ORAL | Status: DC
  Filled 2012-06-21 (×2): qty 1

## 2012-06-21 MED ORDER — HYDROMORPHONE HCL PF 1 MG/ML IJ SOLN
INTRAMUSCULAR | Status: AC
  Filled 2012-06-21: qty 1

## 2012-06-21 MED ORDER — OMEGA-3-ACID ETHYL ESTERS 1 G PO CAPS
1.0000 g | ORAL_CAPSULE | Freq: Every day | ORAL | Status: DC
  Administered 2012-06-22 – 2012-06-24 (×3): 1 g via ORAL
  Filled 2012-06-21 (×4): qty 1

## 2012-06-21 MED ORDER — WARFARIN - PHARMACIST DOSING INPATIENT: Freq: Every day | Status: DC

## 2012-06-21 MED ORDER — METOCLOPRAMIDE HCL 10 MG PO TABS: 5.0000 mg | ORAL_TABLET | Freq: Three times a day (TID) | ORAL | Status: DC | PRN

## 2012-06-21 MED ORDER — GLIMEPIRIDE 2 MG PO TABS
2.0000 mg | ORAL_TABLET | Freq: Every day | ORAL | Status: DC
  Administered 2012-06-22 – 2012-06-24 (×3): 2 mg via ORAL
  Filled 2012-06-21 (×4): qty 1

## 2012-06-21 MED ORDER — GABAPENTIN 300 MG PO CAPS
300.0000 mg | ORAL_CAPSULE | Freq: Two times a day (BID) | ORAL | Status: DC
  Administered 2012-06-21 – 2012-06-24 (×6): 300 mg via ORAL
  Filled 2012-06-21 (×7): qty 1

## 2012-06-21 MED ORDER — ONDANSETRON HCL 4 MG PO TABS: 4.0000 mg | ORAL_TABLET | Freq: Four times a day (QID) | ORAL | Status: DC | PRN

## 2012-06-21 MED ORDER — WARFARIN SODIUM 2.5 MG PO TABS: 2.5000 mg | ORAL_TABLET | Freq: Every day | ORAL | Status: DC

## 2012-06-21 MED ORDER — 0.9 % SODIUM CHLORIDE (POUR BTL) OPTIME
TOPICAL | Status: DC | PRN
  Administered 2012-06-21: 1000 mL

## 2012-06-21 MED ORDER — SODIUM CHLORIDE 0.9 % IV SOLN
INTRAVENOUS | Status: DC
  Administered 2012-06-21: 18:00:00 via INTRAVENOUS

## 2012-06-21 MED ORDER — LISINOPRIL-HYDROCHLOROTHIAZIDE 20-25 MG PO TABS: 1.0000 | ORAL_TABLET | Freq: Every day | ORAL | Status: DC

## 2012-06-21 SURGICAL SUPPLY — 45 items
BANDAGE ESMARK 6X9 LF (GAUZE/BANDAGES/DRESSINGS) ×1 IMPLANT
BANDAGE GAUZE ELAST BULKY 4 IN (GAUZE/BANDAGES/DRESSINGS) ×3 IMPLANT
BLADE SAW RECIP 87.9 MT (BLADE) ×2 IMPLANT
BLADE SURG 21 STRL SS (BLADE) ×2 IMPLANT
BNDG CMPR 9X6 STRL LF SNTH (GAUZE/BANDAGES/DRESSINGS) ×1
BNDG COHESIVE 6X5 TAN STRL LF (GAUZE/BANDAGES/DRESSINGS) ×2 IMPLANT
BNDG ESMARK 6X9 LF (GAUZE/BANDAGES/DRESSINGS) ×2
CLOTH BEACON ORANGE TIMEOUT ST (SAFETY) ×2 IMPLANT
COVER SURGICAL LIGHT HANDLE (MISCELLANEOUS) ×2 IMPLANT
CUFF TOURNIQUET SINGLE 34IN LL (TOURNIQUET CUFF) IMPLANT
CUFF TOURNIQUET SINGLE 44IN (TOURNIQUET CUFF) IMPLANT
DRAIN PENROSE 1/2X12 LTX STRL (WOUND CARE) IMPLANT
DRAPE EXTREMITY T 121X128X90 (DRAPE) ×2 IMPLANT
DRAPE PROXIMA HALF (DRAPES) ×4 IMPLANT
DRAPE U-SHAPE 47X51 STRL (DRAPES) ×4 IMPLANT
DRSG ADAPTIC 3X8 NADH LF (GAUZE/BANDAGES/DRESSINGS) ×2 IMPLANT
DRSG PAD ABDOMINAL 8X10 ST (GAUZE/BANDAGES/DRESSINGS) ×2 IMPLANT
DURAPREP 26ML APPLICATOR (WOUND CARE) ×2 IMPLANT
ELECT REM PT RETURN 9FT ADLT (ELECTROSURGICAL) ×2
ELECTRODE REM PT RTRN 9FT ADLT (ELECTROSURGICAL) ×1 IMPLANT
EVACUATOR 1/8 PVC DRAIN (DRAIN) IMPLANT
GLOVE BIOGEL PI IND STRL 9 (GLOVE) ×1 IMPLANT
GLOVE BIOGEL PI INDICATOR 9 (GLOVE) ×1
GLOVE SURG ORTHO 9.0 STRL STRW (GLOVE) ×2 IMPLANT
GOWN PREVENTION PLUS XLARGE (GOWN DISPOSABLE) ×2 IMPLANT
GOWN SRG XL XLNG 56XLVL 4 (GOWN DISPOSABLE) ×1 IMPLANT
GOWN STRL NON-REIN XL XLG LVL4 (GOWN DISPOSABLE) ×2
KIT BASIN OR (CUSTOM PROCEDURE TRAY) ×2 IMPLANT
KIT ROOM TURNOVER OR (KITS) ×2 IMPLANT
MANIFOLD NEPTUNE II (INSTRUMENTS) ×2 IMPLANT
NS IRRIG 1000ML POUR BTL (IV SOLUTION) ×2 IMPLANT
PACK GENERAL/GYN (CUSTOM PROCEDURE TRAY) ×2 IMPLANT
PAD ARMBOARD 7.5X6 YLW CONV (MISCELLANEOUS) ×4 IMPLANT
SPONGE GAUZE 4X4 12PLY (GAUZE/BANDAGES/DRESSINGS) ×2 IMPLANT
SPONGE LAP 18X18 X RAY DECT (DISPOSABLE) IMPLANT
STAPLER VISISTAT 35W (STAPLE) IMPLANT
STOCKINETTE IMPERVIOUS LG (DRAPES) ×2 IMPLANT
SUT PDS AB 1 CT  36 (SUTURE)
SUT PDS AB 1 CT 36 (SUTURE) IMPLANT
SUT SILK 2 0 (SUTURE) ×2
SUT SILK 2-0 18XBRD TIE 12 (SUTURE) ×1 IMPLANT
TOWEL OR 17X24 6PK STRL BLUE (TOWEL DISPOSABLE) ×2 IMPLANT
TOWEL OR 17X26 10 PK STRL BLUE (TOWEL DISPOSABLE) ×2 IMPLANT
TUBE ANAEROBIC SPECIMEN COL (MISCELLANEOUS) IMPLANT
WATER STERILE IRR 1000ML POUR (IV SOLUTION) ×2 IMPLANT

## 2012-06-21 NOTE — Transfer of Care (Signed)
Immediate Anesthesia Transfer of Care Note  Patient: Sydney [name]  Procedure(s) Performed: Procedure(s) (LRB) with comments: AMPUTATION BELOW KNEE (Left) - Left Below Knee Amputation  Patient Location: PACU  Anesthesia Type:General  Level of Consciousness: awake, alert , oriented and sedated  Airway & Oxygen Therapy: Patient Spontanous Breathing and Patient connected to nasal cannula oxygen  Post-op Assessment: Report given to PACU RN, Post -op Vital signs reviewed and stable and Patient moving all extremities  Post vital signs: Reviewed and stable  Complications: No apparent anesthesia complications

## 2012-06-21 NOTE — Op Note (Signed)
OPERATIVE REPORT  DATE OF SURGERY: 06/21/2012  PATIENT:  Sydney [name],  78 y.o. female  PRE-OPERATIVE DIAGNOSIS:  Left Foot Gangrene  POST-OPERATIVE DIAGNOSIS:  Left Foot Gangrene  PROCEDURE:  Procedure(s): AMPUTATION BELOW KNEE  SURGEON:  Surgeon(s): Nadara Mustard, MD  ANESTHESIA:   general  EBL:  min ML  SPECIMEN:  Source of Specimen:  left leg  TOURNIQUET:   Total Tourniquet Time Documented: Thigh (Left) - 6 minutes  PROCEDURE DETAILS: Patient is an 78 year old woman with peripheral vascular disease. She is status post foot salvage surgery which has failed she now has gangrenous changes to the entire foot and presents at this time for transtibial amputation. Risks and benefits were discussed including infection neurovascular injury nonhealing of the incision need for additional surgery. Patient states she understands and wishes to proceed at this time. Description of procedure patient was brought to the operating room and underwent a general anesthetic. After adequate levels of anesthesia were obtained patient's left lower extremity was prepped using DuraPrep draped into a sterile field. A transverse incision was made 11 cm distal the tibial tubercle this curved proximally a large posterior flap was created. The tibia was transected 1 cm proximal to the skin incision the fibula was transected just proximal to the tibial incision. Amputation knife was used to create a large posterior flap. The sciatic nerve was pulled cut and allowed to retract. The vascular bundles were suture ligated with 2-0 silk. The tourniquet was deflated after 6 minutes. Hemostasis was obtained. The deep and superficial fascial layers were closed using #1 PDS. The skin was closed using staples. The wound was covered with Adaptic orthopedic sponges AB dressing Kerlix and Coban. Patient was extubated taken to the PACU in stable condition.  PLAN OF CARE: Admit to inpatient   PATIENT DISPOSITION:  PACU -  hemodynamically stable.   Nadara Mustard, MD 06/21/2012 2:29 PM

## 2012-06-21 NOTE — Preoperative (Signed)
Beta Blockers   Reason not to administer Beta Blockers:Not Applicable 

## 2012-06-21 NOTE — Anesthesia Postprocedure Evaluation (Signed)
  Anesthesia Post-op Note  Patient: Sydney [name]  Procedure(s) Performed: Procedure(s) (LRB) with comments: AMPUTATION BELOW KNEE (Left) - Left Below Knee Amputation  Patient Location: PACU  Anesthesia Type:General  Level of Consciousness: awake, alert  and oriented  Airway and Oxygen Therapy: Patient Spontanous Breathing  Post-op Pain: mild  Post-op Assessment: Post-op Vital signs reviewed and Patient's Cardiovascular Status Stable  Post-op Vital Signs: stable  Complications: No apparent anesthesia complications

## 2012-06-21 NOTE — H&P (Signed)
Sydney [name] is an 78 y.o. female.   Chief Complaint: Gangrene left foot HPI: Patient is an 78 year old woman with severe peripheral vascular disease she is status post foot salvage surgery with well-healing lateral column surgery. Patient presents at this time with progressive gangrenous necrotic changes from her medial column surgery.  Past Medical History  Diagnosis Date  . Lung disease     BRONCHOSPASTIC  . Neuropathy     takes Gabapentin bid  . Hypercholesterolemia     doesn't require meds for this  . Anemia   . COPD (chronic obstructive pulmonary disease)   . Blood transfusion 2012 ?APRIL  . Hypertension     takes Prinizide daily  . Chronic atrial fibrillation     takes Coumadin as instructed  . Peripheral vascular disease   . CHF (congestive heart failure)     takes Lasix every other day  . Shortness of breath     uses spiriva daily;with exertion  . Emphysema   . Pneumonia     hx of   . History of head injury     many yrs ago  . Arthritis     rheumatoid and takes Methotrexate weekly  . Bruises easily   . Open wound     with a foul odor-left big toe  . History of bladder infections   . Vaginal discharge     has seen GYN but says its nothing to worry about  . History of blood transfusion   . Diabetes mellitus     takes Metformin and Amaryl daily  . Hypothyroidism     takes Synthroid daily  . Macular degeneration     dry    Past Surgical History  Procedure Date  . Total hip arthroplasty     LEFT HIP  . Back surgery   . Fracture surgery 2011    LEFT HIP FX /REPLACEMENT   . Joint replacement     HIP LEFT   . Cholecystectomy 1951  . Dilation and curettage of uterus   . Abdominal hysterectomy 1975  . Amputation 11/09/2011    Procedure: AMPUTATION DIGIT;  Surgeon: Kathryne Hitch, MD;  Location: [facility] OR;  Service: Orthopedics;  Laterality: Right;  Right Great Toe Amputation  . Eye surgery     bilateral -cataract surgery  . Amputation 01/05/2012   Procedure: AMPUTATION RAY;  Surgeon: Kathryne Hitch, MD;  Location: [facility] ORS;  Service: Orthopedics;  Laterality: Left;  Amputation Left 4th and 5th Toes  . Cardioversion   . Amputation 05/31/2012    Procedure: AMPUTATION RAY;  Surgeon: Nadara Mustard, MD;  Location: [facility] OR;  Service: Orthopedics;  Laterality: Left;  Left foot 1st ray amp    Family History  Problem Relation Age of Onset  . Heart disease Mother   . Stroke Mother   . Heart disease Father   . Stroke Father   . Heart attack Father   . Muscular dystrophy Brother   . Anesthesia problems Neg Hx   . Hypotension Neg Hx   . Malignant hyperthermia Neg Hx   . Pseudochol deficiency Neg Hx    Social History:  reports that she has quit smoking. Her smoking use included Cigarettes. She quit smokeless tobacco use about 31 years ago. She reports that she does not drink alcohol or use illicit drugs.  Allergies:  Allergies  Allergen Reactions  . Other     Narcotics=makes her very disoriented  . Percocet (Oxycodone-Acetaminophen) Other (See Comments)  Dilerium, hallucinations  . Tramadol Anxiety  . Vicodin (Hydrocodone-Acetaminophen) Anxiety    No prescriptions prior to admission    No results found for this or any previous visit (from the past 48 hour(s)). No results found.  Review of Systems  All other systems reviewed and are negative.    There were no vitals taken for this visit. Physical Exam   on examination patient does not have palpa. Medial incision of the foot shows necrotic gangrenous changes with exposed bone foul-smelling necrotic tissue involving the entire medial half of the foot.ble pulses the lateral incision has healed well Assessment/Plan Assessment: Gangrene left foot.  Plan: Will plan for a left transtibial amputation. Risks and benefits were discussed including infection neurovascular injury nonhealing of the wound need for additional surgery. Patient and family state they understand and wish  to proceed at this time.   Hoa Deriso V 06/21/2012, 6:13 AM

## 2012-06-21 NOTE — Progress Notes (Signed)
Report received 

## 2012-06-21 NOTE — Progress Notes (Signed)
ANTICOAGULATION CONSULT NOTE - Initial Consult  Pharmacy Consult for coumadin Indication: VTE prophylaxis, h/o afib  Allergies  Allergen Reactions  . Other     Narcotics=makes her very disoriented  . Percocet (Oxycodone-Acetaminophen) Other (See Comments)    Dilerium, hallucinations  . Tramadol Anxiety  . Vicodin (Hydrocodone-Acetaminophen) Anxiety    Patient Measurements: Wt= 59.8kg  Vital Signs: Temp: 97.2 F (36.2 C) (11/08 1645) Temp src: Oral (11/08 1143) BP: 153/55 mmHg (11/08 1641) Pulse Rate: 84  (11/08 1645)  Labs:  [facility] 06/21/12 1232  HGB 8.3*  HCT 26.7*  PLT 242  APTT 63*  LABPROT 23.5*  INR 2.20*  HEPARINUNFRC --  CREATININE 0.88  CKTOTAL --  CKMB --  TROPONINI --    Medical History: Past Medical History  Diagnosis Date  . Lung disease     BRONCHOSPASTIC  . Neuropathy     takes Gabapentin bid  . Hypercholesterolemia     doesn't require meds for this  . Anemia   . COPD (chronic obstructive pulmonary disease)   . Blood transfusion 2012 ?APRIL  . Hypertension     takes Prinizide daily  . Chronic atrial fibrillation     takes Coumadin as instructed  . Peripheral vascular disease   . CHF (congestive heart failure)     takes Lasix every other day  . Shortness of breath     uses spiriva daily;with exertion  . Emphysema   . Pneumonia     hx of   . History of head injury     many yrs ago  . Arthritis     rheumatoid and takes Methotrexate weekly  . Bruises easily   . Open wound     with a foul odor-left big toe  . History of bladder infections   . Vaginal discharge     has seen GYN but says its nothing to worry about  . History of blood transfusion   . Diabetes mellitus     takes Metformin and Amaryl daily  . Hypothyroidism     takes Synthroid daily  . Macular degeneration     dry    Medications:  Prescriptions prior to admission  Medication Sig Dispense Refill  . calcium carbonate (OS-CAL) 600 MG TABS Take 600 mg by mouth  daily.      . Calcium-Vitamin D-Vitamin K ([employer] CALCIUM SOFT CHEWS PO) Take 2 tablets by mouth daily.      . Cholecalciferol (VITAMIN D3) 400 UNITS CAPS Take 1 tablet by mouth daily.        . cyanocobalamin 500 MCG tablet Take 500 mcg by mouth daily.      Marland Kitchen doxycycline (VIBRA-TABS) 100 MG tablet Take 100 mg by mouth 2 (two) times daily.      . ferrous fumarate-iron polysaccharide complex (TANDEM) 162-115.2 MG CAPS Take 1 capsule by mouth daily with breakfast.      . fish oil-omega-3 fatty acids 1000 MG capsule Take 1 g by mouth daily.      . folic acid (FOLVITE) 1 MG tablet Take 1 mg by mouth daily.      . furosemide (LASIX) 20 MG tablet Take 1 tablet (20 mg total) by mouth every other day.  30 tablet  3  . gabapentin (NEURONTIN) 300 MG capsule Take 300 mg by mouth 2 (two) times daily.       Marland Kitchen glimepiride (AMARYL) 4 MG tablet Take 2 mg by mouth daily before breakfast.       . levothyroxine (SYNTHROID,  LEVOTHROID) 50 MCG tablet Take 50 mcg by mouth daily with breakfast.       . lisinopril-hydrochlorothiazide (PRINZIDE,ZESTORETIC) 20-25 MG per tablet Take 1 tablet by mouth daily with breakfast.       . metFORMIN (GLUCOPHAGE) 500 MG tablet Take 500 mg by mouth daily with breakfast.       . METHOTREXATE SODIUM IJ Inject 0.4 mLs as directed once a week. On tuesday      . Multiple Vitamin (MULITIVITAMIN WITH MINERALS) TABS Take 1 tablet by mouth daily.      . Multiple Vitamins-Minerals (ICAPS AREDS FORMULA PO) Take 1 capsule by mouth daily with breakfast.      . nitroGLYCERIN (NITRODUR - DOSED IN MG/24 HR) 0.2 mg/hr Place 1 patch onto the skin daily.       . silver sulfADIAZINE (SILVADENE) 1 % cream Apply 1 application topically daily.      Marland Kitchen tiotropium (SPIRIVA) 18 MCG inhalation capsule Place 18 mcg into inhaler and inhale daily with breakfast.       . vitamin C (ASCORBIC ACID) 500 MG tablet Take 500 mg by mouth daily.      Marland Kitchen warfarin (COUMADIN) 5 MG tablet Take 2.5-5 mg by mouth daily. Take 5mg  on  Tuesday. Take 2.5mg  the rest of the week        Assessment: 78 yo female with severe PVD on coumadin PTA for afib  and noted s/p L BKA to re-start coumadin for VTE prophylaxis. INR today is 2.2 and home coumadin dose noted as 2.5mg /day except 5mg  on Tue (last dose on 06/19/12).  Goal of Therapy:  INR 2-3 Monitor platelets by anticoagulation protocol: Yes   Plan:  -Coumadin 5mg  po today -Daily PT/INR  Thank you for asking pharmacy to be involved in the care of this patient.  Harland German, Pharm D 06/21/2012 6:05 PM

## 2012-06-21 NOTE — Anesthesia Preprocedure Evaluation (Signed)
Anesthesia Evaluation  Patient identified by MRN, date of birth, ID band Patient awake    Reviewed: Allergy & Precautions, H&P , NPO status , Patient's Chart, lab work & pertinent test results  Airway Mallampati: II TM Distance: >3 FB Neck ROM: Full    Dental  (+) Teeth Intact   Pulmonary  breath sounds clear to auscultation        Cardiovascular Rhythm:Irregular Rate:Normal     Neuro/Psych    GI/Hepatic   Endo/Other    Renal/GU      Musculoskeletal   Abdominal   Peds  Hematology   Anesthesia Other Findings   Reproductive/Obstetrics                           Anesthesia Physical Anesthesia Plan  ASA: III  Anesthesia Plan: General   Post-op Pain Management:    Induction: Intravenous  Airway Management Planned: LMA  Additional Equipment:   Intra-op Plan:   Post-operative Plan: Extubation in OR  Informed Consent: I have reviewed the patients History and Physical, chart, labs and discussed the procedure including the risks, benefits and alternatives for the proposed anesthesia with the patient or authorized representative who has indicated his/her understanding and acceptance.   Dental advisory given  Plan Discussed with: CRNA and Surgeon  Anesthesia Plan Comments: (Gangrene L. Leg Type 2 DM 94 Chronic Afib  Htn Rheumatoid arthritis on methotrexate  Plan GA with LMA  Kipp Brood )        Anesthesia Quick Evaluation

## 2012-06-22 LAB — PROTIME-INR: Prothrombin Time: 25.2 seconds — ABNORMAL HIGH (ref 11.6–15.2)

## 2012-06-22 LAB — GLUCOSE, CAPILLARY

## 2012-06-22 MED ORDER — WARFARIN SODIUM 2.5 MG PO TABS
2.5000 mg | ORAL_TABLET | Freq: Once | ORAL | Status: AC
  Administered 2012-06-22: 2.5 mg via ORAL
  Filled 2012-06-22: qty 1

## 2012-06-22 NOTE — Progress Notes (Signed)
Subjective: 1 Day Post-Op Procedure(s) (LRB): AMPUTATION BELOW KNEE (Left) Patient reports pain as moderate.  Awake and alert.  Stable vitals.  Objective: Vital signs in last 24 hours: Temp:  [96.8 F (36 C)-98.2 F (36.8 C)] 98.1 F (36.7 C) (11/09 0514) Pulse Rate:  [77-95] 82  (11/09 0514) Resp:  [10-23] 16  (11/09 0514) BP: (109-161)/(52-115) 131/63 mmHg (11/09 0514) SpO2:  [97 %-100 %] 97 % (11/09 1011)  Intake/Output from previous day: 11/08 0701 - 11/09 0700 In: 1059.3 [I.V.:1059.3] Out: 350 [Urine:250; Blood:100] Intake/Output this shift:     Basename 06/21/12 1232  HGB 8.3*    Basename 06/21/12 1232  WBC 6.3  RBC 3.01*  HCT 26.7*  PLT 242    Basename 06/21/12 1232  NA 137  K 4.0  CL 99  CO2 27  BUN 26*  CREATININE 0.88  GLUCOSE 109*  CALCIUM 9.7    Basename 06/22/12 0455 06/21/12 1232  LABPT -- --  INR 2.42* 2.20*    Incision: dressing C/D/I  Assessment/Plan: 1 Day Post-Op Procedure(s) (LRB): AMPUTATION BELOW KNEE (Left) Up with therapy  Sydney [name] 06/22/2012, 10:22 AM

## 2012-06-22 NOTE — Progress Notes (Signed)
ANTICOAGULATION CONSULT NOTE - Initial Consult  Pharmacy Consult for coumadin Indication: VTE prophylaxis, h/o afib  Allergies  Allergen Reactions  . Other     Narcotics=makes her very disoriented  . Percocet (Oxycodone-Acetaminophen) Other (See Comments)    Dilerium, hallucinations  . Tramadol Anxiety  . Vicodin (Hydrocodone-Acetaminophen) Anxiety    Patient Measurements: Wt= 59.8kg  Vital Signs: Temp: 98.1 F (36.7 C) (11/09 0514) BP: 131/63 mmHg (11/09 0514) Pulse Rate: 82  (11/09 0514)  Labs:  Basename 06/22/12 0455 06/21/12 1232  HGB -- 8.3*  HCT -- 26.7*  PLT -- 242  APTT -- 63*  LABPROT 25.2* 23.5*  INR 2.42* 2.20*  HEPARINUNFRC -- --  CREATININE -- 0.88  CKTOTAL -- --  CKMB -- --  TROPONINI -- --    Medical History: Past Medical History  Diagnosis Date  . Lung disease     BRONCHOSPASTIC  . Neuropathy     takes Gabapentin bid  . Hypercholesterolemia     doesn't require meds for this  . Anemia   . COPD (chronic obstructive pulmonary disease)   . Blood transfusion 2012 ?APRIL  . Hypertension     takes Prinizide daily  . Chronic atrial fibrillation     takes Coumadin as instructed  . Peripheral vascular disease   . CHF (congestive heart failure)     takes Lasix every other day  . Shortness of breath     uses spiriva daily;with exertion  . Emphysema   . Pneumonia     hx of   . History of head injury     many yrs ago  . Arthritis     rheumatoid and takes Methotrexate weekly  . Bruises easily   . Open wound     with a foul odor-left big toe  . History of bladder infections   . Vaginal discharge     has seen GYN but says its nothing to worry about  . History of blood transfusion   . Diabetes mellitus     takes Metformin and Amaryl daily  . Hypothyroidism     takes Synthroid daily  . Macular degeneration     dry    Medications:  Prescriptions prior to admission  Medication Sig Dispense Refill  . calcium carbonate (OS-CAL) 600 MG  TABS Take 600 mg by mouth daily.      . Calcium-Vitamin D-Vitamin K ([employer] CALCIUM SOFT CHEWS PO) Take 2 tablets by mouth daily.      . Cholecalciferol (VITAMIN D3) 400 UNITS CAPS Take 1 tablet by mouth daily.        . cyanocobalamin 500 MCG tablet Take 500 mcg by mouth daily.      Marland Kitchen doxycycline (VIBRA-TABS) 100 MG tablet Take 100 mg by mouth 2 (two) times daily.      . ferrous fumarate-iron polysaccharide complex (TANDEM) 162-115.2 MG CAPS Take 1 capsule by mouth daily with breakfast.      . fish oil-omega-3 fatty acids 1000 MG capsule Take 1 g by mouth daily.      . folic acid (FOLVITE) 1 MG tablet Take 1 mg by mouth daily.      . furosemide (LASIX) 20 MG tablet Take 1 tablet (20 mg total) by mouth every other day.  30 tablet  3  . gabapentin (NEURONTIN) 300 MG capsule Take 300 mg by mouth 2 (two) times daily.       Marland Kitchen glimepiride (AMARYL) 4 MG tablet Take 2 mg by mouth daily before breakfast.       .  levothyroxine (SYNTHROID, LEVOTHROID) 50 MCG tablet Take 50 mcg by mouth daily with breakfast.       . lisinopril-hydrochlorothiazide (PRINZIDE,ZESTORETIC) 20-25 MG per tablet Take 1 tablet by mouth daily with breakfast.       . metFORMIN (GLUCOPHAGE) 500 MG tablet Take 500 mg by mouth daily with breakfast.       . METHOTREXATE SODIUM IJ Inject 0.4 mLs as directed once a week. On tuesday      . Multiple Vitamin (MULITIVITAMIN WITH MINERALS) TABS Take 1 tablet by mouth daily.      . Multiple Vitamins-Minerals (ICAPS AREDS FORMULA PO) Take 1 capsule by mouth daily with breakfast.      . nitroGLYCERIN (NITRODUR - DOSED IN MG/24 HR) 0.2 mg/hr Place 1 patch onto the skin daily.       . silver sulfADIAZINE (SILVADENE) 1 % cream Apply 1 application topically daily.      Marland Kitchen tiotropium (SPIRIVA) 18 MCG inhalation capsule Place 18 mcg into inhaler and inhale daily with breakfast.       . vitamin C (ASCORBIC ACID) 500 MG tablet Take 500 mg by mouth daily.      Marland Kitchen warfarin (COUMADIN) 5 MG tablet Take 2.5-5 mg by  mouth daily. Take 5mg  on Tuesday. Take 2.5mg  the rest of the week        Assessment: 78 yo female with severe PVD on coumadin PTA for afib  and noted s/p L BKA to re-start coumadin for VTE prophylaxis. INR today is 2.42 and home coumadin dose noted as 2.5mg /day except 5mg  on Tue.  Goal of Therapy:  INR 2-3 Monitor platelets by anticoagulation protocol: Yes   Plan:  -Coumadin 2.5mg  po today -Daily PT/INR  Thank you for asking pharmacy to be involved in the care of this patient.  Franchot Erichsen, Pharm.D. Clinical Pharmacist   06/22/2012 8:33 AM

## 2012-06-22 NOTE — Evaluation (Signed)
Physical Therapy Evaluation Patient Details Name: Sydney [name] MRN: [id number] DOB: November 08, 1920 Today's Date: 06/22/2012 Time: 4098-1191 PT Time Calculation (min): 26 min  PT Assessment / Plan / Recommendation Clinical Impression  Pt is an 78 y/o female s/p L BKA.  Pt will benefit from Acute PT to maximize overall mobility in preparation for inpatient rehab and eventual return to living alone.      PT Assessment  Patient needs continued PT services    Follow Up Recommendations  CIR;Supervision/Assistance - 24 hour    Does the patient have the potential to tolerate intense rehabilitation      Barriers to Discharge Decreased caregiver support;Inaccessible home environment Family can assist pt as needed.  Pt has 4 steps to enter her home.     Equipment Recommendations  None recommended by PT    Recommendations for Other Services OT consult   Frequency Min 5X/week    Precautions / Restrictions Precautions Precautions: Fall Restrictions Weight Bearing Restrictions: Yes LLE Weight Bearing: Non weight bearing   Pertinent Vitals/Pain Pt reporting pain 6/10 in residual limb.      Mobility  Bed Mobility Bed Mobility: Supine to Sit;Sitting - Scoot to Edge of Bed Supine to Sit: 4: Min assist;With rails;HOB elevated Supine to Sit: Patient Percentage: 80% Sitting - Scoot to Edge of Bed: 4: Min assist Sitting - Scoot to [place] of Bed: Patient Percentage: 80% Details for Bed Mobility Assistance: Assist to raise trunk and scoot hips forward.   Transfers Transfers: Sit to Stand;Stand to Sit;Stand Pivot Transfers Sit to Stand: 1: +2 Total assist;With upper extremity assist;From bed;From chair/3-in-1;With armrests Sit to Stand: Patient Percentage: 60% Stand to Sit: 1: +2 Total assist;With upper extremity assist;With armrests;To bed;To chair/3-in-1 Stand to Sit: Patient Percentage: 70% Stand Pivot Transfers: 3: Mod assist Stand Pivot Transfers: Patient Percentage: 50% Details for  Transfer Assistance: Assist for balance.  Assist to manage body wt to initiate standing and control descent to chair.  Cues for technique.  Pt leans to the right in standing.  Ambulation/Gait Ambulation/Gait Assistance: Not tested (comment) Wheelchair Mobility Wheelchair Mobility: No    Shoulder Instructions     Exercises     PT Diagnosis: Difficulty walking;Acute pain;Generalized weakness  PT Problem List: Decreased strength;Decreased range of motion;Decreased activity tolerance;Decreased balance;Decreased mobility;Decreased knowledge of use of DME;Pain PT Treatment Interventions: DME instruction;Gait training;Stair training;Functional mobility training;Therapeutic activities;Therapeutic exercise;Balance training;Patient/family education   PT Goals Acute Rehab PT Goals PT Goal Formulation: With patient Time For Goal Achievement: 06/22/12 Potential to Achieve Goals: Good Pt will go Supine/Side to Sit: with supervision PT Goal: Supine/Side to Sit - Progress: Goal set today Pt will go Sit to Supine/Side: with supervision;with HOB 0 degrees PT Goal: Sit to Supine/Side - Progress: Goal set today Pt will go Sit to Stand: with min assist PT Goal: Sit to Stand - Progress: Goal set today Pt will go Stand to Sit: with min assist PT Goal: Stand to Sit - Progress: Goal set today Pt will Transfer Bed to Chair/Chair to Bed: with mod assist PT Transfer Goal: Bed to Chair/Chair to Bed - Progress: Goal set today Pt will Stand: with min assist;1 - 2 min PT Goal: Stand - Progress: Goal set today Pt will Ambulate: 1 - 15 feet;with rolling walker;with max assist PT Goal: Ambulate - Progress: Goal set today  Visit Information  Last PT Received On: 06/22/12 Assistance Needed: +2    Subjective Data  Subjective: Agree to PT eval. Patient Stated Goal: To eventually go  home   Prior Functioning  Home Living Lives With: Family Available Help at Discharge: Family Type of Home: House Home Access:  Stairs to enter Secretary/administrator of Steps: 4 Entrance Stairs-Rails: Right;Left;Can reach both Home Layout: One level Bathroom Shower/Tub: Walk-in Contractor: Handicapped height Bathroom Accessibility: Yes Prior Function Level of Independence: Independent with assistive device(s) Able to Take Stairs?: No Driving: No Vocation: Retired Musician: No difficulties Dominant Hand: Right    Cognition  Overall Cognitive Status: Appears within functional limits for tasks assessed/performed Arousal/Alertness: Awake/alert Orientation Level: Appears intact for tasks assessed Behavior During Session: [facility] for tasks performed    Extremity/Trunk Assessment Right Upper Extremity Assessment RUE ROM/Strength/Tone: Within functional levels Left Upper Extremity Assessment LUE ROM/Strength/Tone: Within functional levels Right Lower Extremity Assessment RLE ROM/Strength/Tone: Within functional levels (Prior great toe amputation. ) RLE Sensation: History of peripheral neuropathy Left Lower Extremity Assessment LLE ROM/Strength/Tone: Unable to fully assess;Due to pain (BKA) LLE Sensation: History of peripheral neuropathy LLE Coordination: WFL - gross motor (Pt able to lift residual limb independently) Trunk Assessment Trunk Assessment: Normal   Balance Balance Balance Assessed: Yes Static Sitting Balance Static Sitting - Balance Support: Feet supported;Bilateral upper extremity supported (right foot supported. ) Static Sitting - Level of Assistance: 4: Min assist Static Sitting - Comment/# of Minutes: Initially required assist to prevent posterior lean.  Educated pt on difference in her lower body wt after the amputation.  Once this was explained pt was able to shift her wt forward without assistacne. Static Standing Balance Static Standing - Balance Support: Bilateral upper extremity supported Static Standing - Level of Assistance: 1: +2 Total assist;3:  Mod assist Static Standing - Comment/# of Minutes: initially pt required +2 total assist to maintain standing balance secondary to Right Lateral lean.  Manual facilitation to shift weight to left was efffective in decreasing the amount of assistance the pt required.    End of Session PT - End of Session Equipment Utilized During Treatment: Gait belt Activity Tolerance: Patient tolerated treatment well Patient left: in chair;with call bell/phone within reach;with family/visitor present Nurse Communication: Mobility status  GP     Naomia Lenderman 06/22/2012, 12:18 PM Tangela Dolliver L. Syretta Kochel DPT [phone]

## 2012-06-22 NOTE — Progress Notes (Signed)
Patient is alert and oriented at all times. Patient stating she is in pain, with a pain level of 8 to 10 out of 10. Patient refused Dilaudid pain medication on 2 occasions. Tylenol given every 4 hours. Helps the pain enough for the patient to rest. 1 hour after tylenol given patient states some relief of pain. Pain level 7/10.

## 2012-06-23 LAB — GLUCOSE, CAPILLARY: Glucose-Capillary: 169 mg/dL — ABNORMAL HIGH (ref 70–99)

## 2012-06-23 LAB — PROTIME-INR
INR: 3.24 — ABNORMAL HIGH (ref 0.00–1.49)
Prothrombin Time: 31.3 seconds — ABNORMAL HIGH (ref 11.6–15.2)

## 2012-06-23 NOTE — Progress Notes (Signed)
ANTICOAGULATION CONSULT NOTE - Initial Consult  Pharmacy Consult for coumadin Indication: VTE prophylaxis, h/o afib  Allergies  Allergen Reactions  . Other     Narcotics=makes her very disoriented  . Percocet (Oxycodone-Acetaminophen) Other (See Comments)    Dilerium, hallucinations  . Tramadol Anxiety  . Vicodin (Hydrocodone-Acetaminophen) Anxiety    Patient Measurements: Wt= 59.8kg  Vital Signs: Temp: 98.1 F (36.7 C) (11/10 0631) Temp src: Axillary (11/10 0631) BP: 133/60 mmHg (11/10 0631) Pulse Rate: 81  (11/10 0631)  Labs:  Basename 06/23/12 0649 06/22/12 0455 06/21/12 1232  HGB -- -- 8.3*  HCT -- -- 26.7*  PLT -- -- 242  APTT -- -- 63*  LABPROT 31.3* 25.2* 23.5*  INR 3.24* 2.42* 2.20*  HEPARINUNFRC -- -- --  CREATININE -- -- 0.88  CKTOTAL -- -- --  CKMB -- -- --  TROPONINI -- -- --    Medical History: Past Medical History  Diagnosis Date  . Lung disease     BRONCHOSPASTIC  . Neuropathy     takes Gabapentin bid  . Hypercholesterolemia     doesn't require meds for this  . Anemia   . COPD (chronic obstructive pulmonary disease)   . Blood transfusion 2012 ?APRIL  . Hypertension     takes Prinizide daily  . Chronic atrial fibrillation     takes Coumadin as instructed  . Peripheral vascular disease   . CHF (congestive heart failure)     takes Lasix every other day  . Shortness of breath     uses spiriva daily;with exertion  . Emphysema   . Pneumonia     hx of   . History of head injury     many yrs ago  . Arthritis     rheumatoid and takes Methotrexate weekly  . Bruises easily   . Open wound     with a foul odor-left big toe  . History of bladder infections   . Vaginal discharge     has seen GYN but says its nothing to worry about  . History of blood transfusion   . Diabetes mellitus     takes Metformin and Amaryl daily  . Hypothyroidism     takes Synthroid daily  . Macular degeneration     dry    Medications:  Prescriptions prior  to admission  Medication Sig Dispense Refill  . calcium carbonate (OS-CAL) 600 MG TABS Take 600 mg by mouth daily.      . Calcium-Vitamin D-Vitamin K ([employer] CALCIUM SOFT CHEWS PO) Take 2 tablets by mouth daily.      . Cholecalciferol (VITAMIN D3) 400 UNITS CAPS Take 1 tablet by mouth daily.        . cyanocobalamin 500 MCG tablet Take 500 mcg by mouth daily.      Marland Kitchen doxycycline (VIBRA-TABS) 100 MG tablet Take 100 mg by mouth 2 (two) times daily.      . ferrous fumarate-iron polysaccharide complex (TANDEM) 162-115.2 MG CAPS Take 1 capsule by mouth daily with breakfast.      . fish oil-omega-3 fatty acids 1000 MG capsule Take 1 g by mouth daily.      . folic acid (FOLVITE) 1 MG tablet Take 1 mg by mouth daily.      . furosemide (LASIX) 20 MG tablet Take 1 tablet (20 mg total) by mouth every other day.  30 tablet  3  . gabapentin (NEURONTIN) 300 MG capsule Take 300 mg by mouth 2 (two) times daily.       Marland Kitchen  glimepiride (AMARYL) 4 MG tablet Take 2 mg by mouth daily before breakfast.       . levothyroxine (SYNTHROID, LEVOTHROID) 50 MCG tablet Take 50 mcg by mouth daily with breakfast.       . lisinopril-hydrochlorothiazide (PRINZIDE,ZESTORETIC) 20-25 MG per tablet Take 1 tablet by mouth daily with breakfast.       . metFORMIN (GLUCOPHAGE) 500 MG tablet Take 500 mg by mouth daily with breakfast.       . METHOTREXATE SODIUM IJ Inject 0.4 mLs as directed once a week. On tuesday      . Multiple Vitamin (MULITIVITAMIN WITH MINERALS) TABS Take 1 tablet by mouth daily.      . Multiple Vitamins-Minerals (ICAPS AREDS FORMULA PO) Take 1 capsule by mouth daily with breakfast.      . nitroGLYCERIN (NITRODUR - DOSED IN MG/24 HR) 0.2 mg/hr Place 1 patch onto the skin daily.       . silver sulfADIAZINE (SILVADENE) 1 % cream Apply 1 application topically daily.      Marland Kitchen tiotropium (SPIRIVA) 18 MCG inhalation capsule Place 18 mcg into inhaler and inhale daily with breakfast.       . vitamin C (ASCORBIC ACID) 500 MG tablet  Take 500 mg by mouth daily.      Marland Kitchen warfarin (COUMADIN) 5 MG tablet Take 2.5-5 mg by mouth daily. Take 5mg  on Tuesday. Take 2.5mg  the rest of the week        Assessment: 78 yo female with severe PVD on coumadin PTA for afib  and noted s/p L BKA to re-start coumadin for VTE prophylaxis. INR today is supratherapeutic at 3.24 after continuing home dose of Coumadin. Home dose noted as 2.5mg /day except 5mg  on Tue. Of note, pt received 5mg  on Friday, 11/8, because patient had missed a dose the day before. No new CBC today. No note of bleeding.   Goal of Therapy:  INR 2-3 Monitor platelets by anticoagulation protocol: Yes   Plan:  -Hold coumadin tonight -Daily PT/INR  Thank you for asking pharmacy to be involved in the care of this patient.  Franchot Erichsen, Pharm.D. Clinical Pharmacist   06/23/2012 10:59 AM

## 2012-06-23 NOTE — Progress Notes (Signed)
Occupational Therapy Evaluation Patient Details Name: Sydney [name] MRN: [id number] DOB: 1920/10/03 Today's Date: 06/23/2012 Time: 8295-6213 OT Time Calculation (min): 42 min  OT Assessment / Plan / Recommendation Clinical Impression  78 yo s/p L BKA secondary to PVD. PTA, pt lived independently alone. Pt is an excellent rehab candidate. Pt is very motivated to return to previous independent level of functioning and has supportive family to help achieve this goal. Pt will be able to achieve modI to S level @ CIR to return to home. Will follow acutely to max independence with ADL and functioinal mobility for ADL to facilitate D/C to CIR. Discussed need for family to build ramp @ home.    OT Assessment  Patient needs continued OT Services    Follow Up Recommendations  CIR    Barriers to Discharge None    Equipment Recommendations  None recommended by OT    Recommendations for Other Services Rehab consult  Frequency  Min 3X/week    Precautions / Restrictions Precautions Precautions: Fall Restrictions Weight Bearing Restrictions: Yes LLE Weight Bearing: Non weight bearing   Pertinent Vitals/Pain 2/10. LLE    ADL  Grooming: Set up Where Assessed - Grooming: Unsupported sitting Upper Body Bathing: Set up Where Assessed - Upper Body Bathing: Supported sitting Lower Body Bathing: Moderate assistance Where Assessed - Lower Body Bathing: Rolling right and/or left;Supine, head of bed up Upper Body Dressing: Minimal assistance Where Assessed - Upper Body Dressing: Unsupported sitting Lower Body Dressing: Maximal assistance Where Assessed - Lower Body Dressing: Rolling right and/or left;Supine, head of bed up Toilet Transfer: Maximal assistance Toilet Transfer Method: Stand pivot Toilet Transfer Equipment: Other (comment) (bed - chair) Toileting - Clothing Manipulation and Hygiene: +1 Total assistance Where Assessed - Toileting Clothing Manipulation and Hygiene: Sit to stand from  3-in-1 or toilet;Lean right and/or left Equipment Used: Gait belt;Rolling walker Transfers/Ambulation Related to ADLs: Max A std pivot ADL Comments: A for LB ADL due to impaired balance and general weakness  Began education on desensitization. Discussed rehab progression with BKA, including complression wrapping/socks and future use of prosthetic.   OT Diagnosis: Generalized weakness;Acute pain  OT Problem List: Decreased strength;Decreased activity tolerance;Impaired balance (sitting and/or standing);Decreased knowledge of use of DME or AE;Decreased knowledge of precautions;Impaired sensation;Pain OT Treatment Interventions: Self-care/ADL training;Therapeutic exercise;Energy conservation;DME and/or AE instruction;Therapeutic activities;Patient/family education;Balance training   OT Goals Acute Rehab OT Goals OT Goal Formulation: With patient Time For Goal Achievement: 07/07/12 Potential to Achieve Goals: Good ADL Goals Pt Will Perform Upper Body Bathing: with set-up;Sitting at sink;Unsupported ADL Goal: Upper Body Bathing - Progress: Goal set today Pt Will Perform Lower Body Bathing: with supervision;Sitting at sink;Standing at sink;Unsupported;with cueing (comment type and amount) ADL Goal: Lower Body Bathing - Progress: Goal set today Pt Will Perform Upper Body Dressing: with set-up;Sitting, chair ADL Goal: Upper Body Dressing - Progress: Goal set today Pt Will Perform Lower Body Dressing: Sit to stand from chair;Unsupported;Other (comment);with supervision (lat leans) ADL Goal: Lower Body Dressing - Progress: Goal set today Pt Will Transfer to Toilet: with min assist;Squat pivot transfer;Drop arm 3-in-1;Maintaining weight bearing status ADL Goal: Toilet Transfer - Progress: Goal set today Pt Will Perform Toileting - Clothing Manipulation: with supervision;Sitting on 3-in-1 or toilet;with cueing (comment type and amount) (lat leans) ADL Goal: Toileting - Clothing Manipulation -  Progress: Goal set today Pt Will Perform Toileting - Hygiene: with modified independence;Leaning right and/or left on 3-in-1/toilet;Sitting on 3-in-1 or toilet ADL Goal: Toileting - Hygiene - Progress:  Goal set today Arm Goals Pt Will Complete Theraband Exer: with supervision, verbal cues required/provided;2 sets;Bilateral upper extremities;to increase strength;Level 2 Theraband Arm Goal: Theraband Exercises - Progress: Goal set today  Visit Information  Last OT Received On: 06/23/12 Assistance Needed: +1    Subjective Data      Prior Functioning     Home Living Lives With: Family;Alone Available Help at Discharge: Family Type of Home: House Home Access: Stairs to enter [employer] of Steps: 4 Entrance Stairs-Rails: Right;Left;Can reach both Home Layout: One level Bathroom Shower/Tub: Walk-in Contractor: Handicapped height Bathroom Accessibility: Yes How Accessible: Accessible via walker Home Adaptive Equipment: Walker - rolling;Wheelchair - Careers adviser (comment);Grab bars in shower;Built-in shower seat (transport chsir) Prior Function Level of Independence: Independent with assistive device(s) Able to Take Stairs?: No Driving: No Vocation: Retired Musician: No difficulties Dominant Hand: Right         Vision/Perception  hx of MD, but functional   Cognition  Overall Cognitive Status: Appears within functional limits for tasks assessed/performed Arousal/Alertness: Awake/alert Orientation Level: Appears intact for tasks assessed Behavior During Session: [facility] for tasks performed    Extremity/Trunk Assessment Right Upper Extremity Assessment RUE ROM/Strength/Tone: WFL for tasks assessed RUE Sensation: History of peripheral neuropathy RUE Coordination: WFL - gross/fine motor Left Upper Extremity Assessment LUE ROM/Strength/Tone: WFL for tasks assessed LUE Sensation: History of peripheral neuropathy LUE  Coordination: WFL - gross/fine motor Right Lower Extremity Assessment RLE ROM/Strength/Tone: WFL for tasks assessed RLE Sensation: History of peripheral neuropathy Left Lower Extremity Assessment LLE ROM/Strength/Tone: Deficits LLE ROM/Strength/Tone Deficits: L BKA. Able to achieve full extension LLE Sensation: History of peripheral neuropathy Trunk Assessment Trunk Assessment: Normal     Mobility Bed Mobility Bed Mobility: Rolling Right;Rolling Left;Right Sidelying to Sit;Sitting - Scoot to Edge of Bed Rolling Right: 5: Supervision;With rail Rolling Left: 5: Supervision;With rail Right Sidelying to Sit: 4: Min assist;With rails;HOB flat Supine to Sit: 4: Min assist;HOB flat;With rails Sitting - Scoot to Edge of Bed: 5: Supervision Details for Bed Mobility Assistance: Improvement from previous PT session Transfers Transfers: Sit to Stand;Stand to Sit Sit to Stand: 3: Mod assist;From elevated surface;With upper extremity assist;From bed Stand to Sit: 3: Mod assist;With upper extremity assist     Shoulder Instructions     Exercise     Balance Static Sitting Balance Static Sitting - Balance Support: Feet supported;No upper extremity supported Static Sitting - Level of Assistance: 5: Stand by assistance Static Sitting - Comment/# of Minutes: 10 min Static Standing Balance Static Standing - Balance Support: Bilateral upper extremity supported Static Standing - Level of Assistance: 3: Mod assist Static Standing - Comment/# of Minutes: 2 min   End of Session OT - End of Session Equipment Utilized During Treatment: Gait belt Activity Tolerance: Patient tolerated treatment well Patient left: in chair;with call bell/phone within reach;with family/visitor present Nurse Communication: Mobility status;Precautions  GO     Jove Beyl,HILLARY 06/23/2012, 10:33 AM Luisa Dago, OTR/L  [phone] 06/23/2012

## 2012-06-23 NOTE — Progress Notes (Signed)
Patient ID: Sydney [name], female   DOB: August 25, 1920, 78 y.o.   MRN: [id number] No acute changes.  Working with therapy.  Some phantom pains.  Family at bedside requesting acute rehab eval.

## 2012-06-24 ENCOUNTER — Encounter (HOSPITAL_COMMUNITY): Payer: Self-pay | Admitting: Orthopedic Surgery

## 2012-06-24 ENCOUNTER — Inpatient Hospital Stay (HOSPITAL_COMMUNITY)
Admission: RE | Admit: 2012-06-24 | Discharge: 2012-07-05 | DRG: 945 | Disposition: A | Discharge status: Home Health | Payer: Medicare Other | Source: Ambulatory Visit | Attending: Physical Medicine & Rehabilitation | Admitting: Physical Medicine & Rehabilitation

## 2012-06-24 DIAGNOSIS — R413 Other amnesia: Secondary | ICD-10-CM | POA: Diagnosis present

## 2012-06-24 DIAGNOSIS — S88119A Complete traumatic amputation at level between knee and ankle, unspecified lower leg, initial encounter: Secondary | ICD-10-CM | POA: Diagnosis not present

## 2012-06-24 DIAGNOSIS — I739 Peripheral vascular disease, unspecified: Secondary | ICD-10-CM

## 2012-06-24 DIAGNOSIS — Z7901 Long term (current) use of anticoagulants: Secondary | ICD-10-CM | POA: Diagnosis not present

## 2012-06-24 DIAGNOSIS — M069 Rheumatoid arthritis, unspecified: Secondary | ICD-10-CM | POA: Diagnosis present

## 2012-06-24 DIAGNOSIS — E1142 Type 2 diabetes mellitus with diabetic polyneuropathy: Secondary | ICD-10-CM | POA: Diagnosis not present

## 2012-06-24 DIAGNOSIS — E039 Hypothyroidism, unspecified: Secondary | ICD-10-CM | POA: Diagnosis not present

## 2012-06-24 DIAGNOSIS — Z79899 Other long term (current) drug therapy: Secondary | ICD-10-CM | POA: Diagnosis not present

## 2012-06-24 DIAGNOSIS — I10 Essential (primary) hypertension: Secondary | ICD-10-CM | POA: Diagnosis not present

## 2012-06-24 DIAGNOSIS — E119 Type 2 diabetes mellitus without complications: Secondary | ICD-10-CM | POA: Diagnosis present

## 2012-06-24 DIAGNOSIS — Z96649 Presence of unspecified artificial hip joint: Secondary | ICD-10-CM | POA: Diagnosis not present

## 2012-06-24 DIAGNOSIS — D62 Acute posthemorrhagic anemia: Secondary | ICD-10-CM | POA: Diagnosis present

## 2012-06-24 DIAGNOSIS — I70269 Atherosclerosis of native arteries of extremities with gangrene, unspecified extremity: Secondary | ICD-10-CM | POA: Diagnosis present

## 2012-06-24 DIAGNOSIS — Z5189 Encounter for other specified aftercare: Principal | ICD-10-CM

## 2012-06-24 DIAGNOSIS — Z87891 Personal history of nicotine dependence: Secondary | ICD-10-CM | POA: Diagnosis not present

## 2012-06-24 DIAGNOSIS — J449 Chronic obstructive pulmonary disease, unspecified: Secondary | ICD-10-CM | POA: Diagnosis present

## 2012-06-24 DIAGNOSIS — I4891 Unspecified atrial fibrillation: Secondary | ICD-10-CM | POA: Diagnosis present

## 2012-06-24 DIAGNOSIS — G547 Phantom limb syndrome without pain: Secondary | ICD-10-CM | POA: Diagnosis present

## 2012-06-24 DIAGNOSIS — H3530 Unspecified macular degeneration: Secondary | ICD-10-CM | POA: Diagnosis not present

## 2012-06-24 DIAGNOSIS — L98499 Non-pressure chronic ulcer of skin of other sites with unspecified severity: Secondary | ICD-10-CM | POA: Diagnosis not present

## 2012-06-24 DIAGNOSIS — Z9889 Other specified postprocedural states: Secondary | ICD-10-CM

## 2012-06-24 DIAGNOSIS — M86669 Other chronic osteomyelitis, unspecified tibia and fibula: Secondary | ICD-10-CM | POA: Diagnosis present

## 2012-06-24 DIAGNOSIS — J4489 Other specified chronic obstructive pulmonary disease: Secondary | ICD-10-CM | POA: Diagnosis present

## 2012-06-24 DIAGNOSIS — E1149 Type 2 diabetes mellitus with other diabetic neurological complication: Secondary | ICD-10-CM | POA: Diagnosis present

## 2012-06-24 LAB — GLUCOSE, CAPILLARY
Glucose-Capillary: 68 mg/dL — ABNORMAL LOW (ref 70–99)
Glucose-Capillary: 150 mg/dL — ABNORMAL HIGH (ref 70–99)

## 2012-06-24 MED ORDER — GLIMEPIRIDE 2 MG PO TABS
2.0000 mg | ORAL_TABLET | Freq: Every day | ORAL | Status: DC
  Administered 2012-06-25 – 2012-07-05 (×10): 2 mg via ORAL
  Filled 2012-06-24 (×13): qty 1

## 2012-06-24 MED ORDER — POLYETHYLENE GLYCOL 3350 17 G PO PACK
17.0000 g | PACK | Freq: Every day | ORAL | Status: DC | PRN
  Filled 2012-06-24: qty 1

## 2012-06-24 MED ORDER — METHOCARBAMOL 500 MG PO TABS
500.0000 mg | ORAL_TABLET | Freq: Four times a day (QID) | ORAL | Status: DC | PRN
  Administered 2012-06-24 – 2012-07-04 (×14): 500 mg via ORAL
  Filled 2012-06-24 (×15): qty 1

## 2012-06-24 MED ORDER — INSULIN ASPART 100 UNIT/ML ~~LOC~~ SOLN
0.0000 [IU] | Freq: Three times a day (TID) | SUBCUTANEOUS | Status: DC
  Administered 2012-06-25 (×2): 2 [IU] via SUBCUTANEOUS
  Administered 2012-06-26 – 2012-06-27 (×3): 1 [IU] via SUBCUTANEOUS
  Administered 2012-06-29: 2 [IU] via SUBCUTANEOUS
  Administered 2012-06-30: 1 [IU] via SUBCUTANEOUS
  Administered 2012-07-01: 2 [IU] via SUBCUTANEOUS
  Administered 2012-07-02: 1 [IU] via SUBCUTANEOUS
  Administered 2012-07-03: 2 [IU] via SUBCUTANEOUS
  Administered 2012-07-04 (×2): 1 [IU] via SUBCUTANEOUS

## 2012-06-24 MED ORDER — TRAZODONE HCL 50 MG PO TABS: 25.0000 mg | ORAL_TABLET | Freq: Every evening | ORAL | Status: DC | PRN

## 2012-06-24 MED ORDER — ONDANSETRON HCL 4 MG PO TABS: 4.0000 mg | ORAL_TABLET | Freq: Four times a day (QID) | ORAL | Status: DC | PRN

## 2012-06-24 MED ORDER — FUROSEMIDE 20 MG PO TABS
20.0000 mg | ORAL_TABLET | ORAL | Status: DC
  Administered 2012-06-26 – 2012-07-04 (×5): 20 mg via ORAL
  Filled 2012-06-24 (×5): qty 1

## 2012-06-24 MED ORDER — FOLIC ACID 1 MG PO TABS
1.0000 mg | ORAL_TABLET | Freq: Every day | ORAL | Status: DC
  Administered 2012-06-25 – 2012-07-05 (×11): 1 mg via ORAL
  Filled 2012-06-24 (×12): qty 1

## 2012-06-24 MED ORDER — GUAIFENESIN-DM 100-10 MG/5ML PO SYRP: 5.0000 mL | ORAL_SOLUTION | Freq: Four times a day (QID) | ORAL | Status: DC | PRN

## 2012-06-24 MED ORDER — HYDROCHLOROTHIAZIDE 25 MG PO TABS
25.0000 mg | ORAL_TABLET | Freq: Every day | ORAL | Status: DC
  Administered 2012-06-25 – 2012-07-05 (×11): 25 mg via ORAL
  Filled 2012-06-24 (×12): qty 1

## 2012-06-24 MED ORDER — FLEET ENEMA 7-19 GM/118ML RE ENEM: 1.0000 | ENEMA | Freq: Once | RECTAL | Status: AC | PRN

## 2012-06-24 MED ORDER — BISACODYL 10 MG RE SUPP
10.0000 mg | Freq: Every day | RECTAL | Status: DC | PRN
  Administered 2012-06-28: 10 mg via RECTAL
  Filled 2012-06-24: qty 1

## 2012-06-24 MED ORDER — OMEGA-3-ACID ETHYL ESTERS 1 G PO CAPS
1.0000 g | ORAL_CAPSULE | Freq: Every day | ORAL | Status: DC
  Administered 2012-06-25 – 2012-07-05 (×11): 1 g via ORAL
  Filled 2012-06-24 (×12): qty 1

## 2012-06-24 MED ORDER — LISINOPRIL 20 MG PO TABS
20.0000 mg | ORAL_TABLET | Freq: Every day | ORAL | Status: DC
  Administered 2012-06-25 – 2012-07-05 (×11): 20 mg via ORAL
  Filled 2012-06-24 (×12): qty 1

## 2012-06-24 MED ORDER — POLYSACCHARIDE IRON COMPLEX 150 MG PO CAPS
150.0000 mg | ORAL_CAPSULE | Freq: Two times a day (BID) | ORAL | Status: DC
  Administered 2012-06-24 – 2012-07-04 (×21): 150 mg via ORAL
  Filled 2012-06-24 (×24): qty 1

## 2012-06-24 MED ORDER — ADULT MULTIVITAMIN W/MINERALS CH
1.0000 | ORAL_TABLET | Freq: Every day | ORAL | Status: DC
  Administered 2012-06-25 – 2012-07-05 (×11): 1 via ORAL
  Filled 2012-06-24 (×12): qty 1

## 2012-06-24 MED ORDER — ALUM & MAG HYDROXIDE-SIMETH 200-200-20 MG/5ML PO SUSP: 30.0000 mL | ORAL | Status: DC | PRN

## 2012-06-24 MED ORDER — TIOTROPIUM BROMIDE MONOHYDRATE 18 MCG IN CAPS
18.0000 ug | ORAL_CAPSULE | Freq: Every day | RESPIRATORY_TRACT | Status: DC
  Administered 2012-06-25 – 2012-07-05 (×7): 18 ug via RESPIRATORY_TRACT
  Filled 2012-06-24 (×3): qty 5

## 2012-06-24 MED ORDER — WARFARIN SODIUM 2.5 MG PO TABS
2.5000 mg | ORAL_TABLET | Freq: Once | ORAL | Status: AC
Start: 2012-06-24 — End: 2012-06-24
  Administered 2012-06-24: 2.5 mg via ORAL
  Filled 2012-06-24: qty 1

## 2012-06-24 MED ORDER — ACETAMINOPHEN 325 MG PO TABS
325.0000 mg | ORAL_TABLET | ORAL | Status: DC | PRN
  Administered 2012-06-24 – 2012-07-04 (×24): 650 mg via ORAL
  Filled 2012-06-24 (×26): qty 2

## 2012-06-24 MED ORDER — LEVOTHYROXINE SODIUM 50 MCG PO TABS
50.0000 ug | ORAL_TABLET | Freq: Every day | ORAL | Status: DC
  Administered 2012-06-25 – 2012-07-05 (×11): 50 ug via ORAL
  Filled 2012-06-24 (×12): qty 1

## 2012-06-24 MED ORDER — ONDANSETRON HCL 4 MG/2ML IJ SOLN: 4.0000 mg | Freq: Four times a day (QID) | INTRAMUSCULAR | Status: DC | PRN

## 2012-06-24 MED ORDER — DIPHENHYDRAMINE HCL 12.5 MG/5ML PO ELIX: 12.5000 mg | ORAL_SOLUTION | Freq: Four times a day (QID) | ORAL | Status: DC | PRN

## 2012-06-24 MED ORDER — WARFARIN - PHARMACIST DOSING INPATIENT
Freq: Every day | Status: DC
  Administered 2012-06-27 – 2012-06-28 (×2)

## 2012-06-24 MED ORDER — INSULIN ASPART 100 UNIT/ML ~~LOC~~ SOLN: 0.0000 [IU] | Freq: Every day | SUBCUTANEOUS | Status: DC

## 2012-06-24 MED ORDER — GABAPENTIN 300 MG PO CAPS
300.0000 mg | ORAL_CAPSULE | Freq: Two times a day (BID) | ORAL | Status: DC
  Administered 2012-06-24 – 2012-06-26 (×5): 300 mg via ORAL
  Filled 2012-06-24 (×8): qty 1

## 2012-06-24 MED ORDER — WARFARIN SODIUM 2.5 MG PO TABS
2.5000 mg | ORAL_TABLET | Freq: Once | ORAL | Status: DC
  Filled 2012-06-24: qty 1

## 2012-06-24 MED ORDER — METFORMIN HCL 500 MG PO TABS
500.0000 mg | ORAL_TABLET | Freq: Every day | ORAL | Status: DC
  Administered 2012-06-25 – 2012-07-05 (×11): 500 mg via ORAL
  Filled 2012-06-24 (×12): qty 1

## 2012-06-24 NOTE — Progress Notes (Signed)
ANTICOAGULATION CONSULT NOTE - Initial Consult  Pharmacy Consult for coumadin Indication: VTE prophylaxis, h/o afib  Allergies  Allergen Reactions  . Other     Narcotics=makes her very disoriented  . Percocet (Oxycodone-Acetaminophen) Other (See Comments)    Dilerium, hallucinations  . Tramadol Anxiety  . Vicodin (Hydrocodone-Acetaminophen) Anxiety    Patient Measurements: Wt= 59.8kg  Vital Signs: BP: 148/85 mmHg (11/11 0843) Pulse Rate: 115  (11/11 0843)  Labs:  Basename 06/24/12 0610 06/23/12 0649 06/22/12 0455 06/21/12 1232  HGB -- -- -- 8.3*  HCT -- -- -- 26.7*  PLT -- -- -- 242  APTT -- -- -- 63*  LABPROT 29.8* 31.3* 25.2* --  INR 3.03* 3.24* 2.42* --  HEPARINUNFRC -- -- -- --  CREATININE -- -- -- 0.88  CKTOTAL -- -- -- --  CKMB -- -- -- --  TROPONINI -- -- -- --    Medical History: Past Medical History  Diagnosis Date  . Lung disease     BRONCHOSPASTIC  . Neuropathy     takes Gabapentin bid  . Hypercholesterolemia     doesn't require meds for this  . Anemia   . COPD (chronic obstructive pulmonary disease)   . Blood transfusion 2012 ?APRIL  . Hypertension     takes Prinizide daily  . Chronic atrial fibrillation     takes Coumadin as instructed  . Peripheral vascular disease   . CHF (congestive heart failure)     takes Lasix every other day  . Shortness of breath     uses spiriva daily;with exertion  . Emphysema   . Pneumonia     hx of   . History of head injury     many yrs ago  . Arthritis     rheumatoid and takes Methotrexate weekly  . Bruises easily   . Open wound     with a foul odor-left big toe  . History of bladder infections   . Vaginal discharge     has seen GYN but says its nothing to worry about  . History of blood transfusion   . Diabetes mellitus     takes Metformin and Amaryl daily  . Hypothyroidism     takes Synthroid daily  . Macular degeneration     dry    Medications:  Prescriptions prior to admission    Medication Sig Dispense Refill  . calcium carbonate (OS-CAL) 600 MG TABS Take 600 mg by mouth daily.      . Calcium-Vitamin D-Vitamin K ([employer] CALCIUM SOFT CHEWS PO) Take 2 tablets by mouth daily.      . Cholecalciferol (VITAMIN D3) 400 UNITS CAPS Take 1 tablet by mouth daily.        . cyanocobalamin 500 MCG tablet Take 500 mcg by mouth daily.      Marland Kitchen doxycycline (VIBRA-TABS) 100 MG tablet Take 100 mg by mouth 2 (two) times daily.      . ferrous fumarate-iron polysaccharide complex (TANDEM) 162-115.2 MG CAPS Take 1 capsule by mouth daily with breakfast.      . fish oil-omega-3 fatty acids 1000 MG capsule Take 1 g by mouth daily.      . folic acid (FOLVITE) 1 MG tablet Take 1 mg by mouth daily.      . furosemide (LASIX) 20 MG tablet Take 1 tablet (20 mg total) by mouth every other day.  30 tablet  3  . gabapentin (NEURONTIN) 300 MG capsule Take 300 mg by mouth 2 (two) times daily.       Marland Kitchen  glimepiride (AMARYL) 4 MG tablet Take 2 mg by mouth daily before breakfast.       . levothyroxine (SYNTHROID, LEVOTHROID) 50 MCG tablet Take 50 mcg by mouth daily with breakfast.       . lisinopril-hydrochlorothiazide (PRINZIDE,ZESTORETIC) 20-25 MG per tablet Take 1 tablet by mouth daily with breakfast.       . metFORMIN (GLUCOPHAGE) 500 MG tablet Take 500 mg by mouth daily with breakfast.       . METHOTREXATE SODIUM IJ Inject 0.4 mLs as directed once a week. On tuesday      . Multiple Vitamin (MULITIVITAMIN WITH MINERALS) TABS Take 1 tablet by mouth daily.      . Multiple Vitamins-Minerals (ICAPS AREDS FORMULA PO) Take 1 capsule by mouth daily with breakfast.      . nitroGLYCERIN (NITRODUR - DOSED IN MG/24 HR) 0.2 mg/hr Place 1 patch onto the skin daily.       . silver sulfADIAZINE (SILVADENE) 1 % cream Apply 1 application topically daily.      Marland Kitchen tiotropium (SPIRIVA) 18 MCG inhalation capsule Place 18 mcg into inhaler and inhale daily with breakfast.       . vitamin C (ASCORBIC ACID) 500 MG tablet Take 500 mg by  mouth daily.      Marland Kitchen warfarin (COUMADIN) 5 MG tablet Take 2.5-5 mg by mouth daily. Take 5mg  on Tuesday. Take 2.5mg  the rest of the week        Assessment: 78 y.o. F resumed on warfarin s/p L BKA on 11/8 for hx Afib and VTE px s/p procedure. INR this morning is slightly SUPRAtherapeutic despite holding the warfarin dose on 11/10 (INR 3.03 << 3.24, goal of 2-3). No CBC since 11/8 -- no s/sx of bleeding noted. Will get CBC with a.m labs on 11/12 to access for bleeding if patient still here.   PTA the patient was noted to be taking 2.5 mg daily EXCEPT for 5 mg on Tuesdays only. The patient has been re-educated on warfarin this admission.   Goal of Therapy:  INR 2-3  Plan:  1. Warfarin 2.5 mg x 1 dose at 1800 today 2. Will continue to monitor for any signs/symptoms of bleeding and will follow up with PT/INR in the a.m.   Georgina Pillion, PharmD, BCPS Clinical Pharmacist Pager: [phone] 06/24/2012 9:51 AM

## 2012-06-24 NOTE — Progress Notes (Signed)
UR COMPLETED  

## 2012-06-24 NOTE — Progress Notes (Signed)
Rehab Admissions Coordinator Note:  Patient was screened by Trish Mage for appropriateness for an Inpatient Acute Rehab Consult. I am deferring prescreen since an inpatient rehab consult has already been ordered and is pending.   Trish Mage 06/24/2012, 8:52 AM  I can be reached at [phone].

## 2012-06-24 NOTE — PMR Pre-admission (Addendum)
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 Date: 03/14/1988    Deduct: $1184      Out of Pocket Max: none     Life Max: unlimited CIR: 100%      SNF: 100 days Outpatient: 80%     Co-Pay: 20% Home Health: 100%      Co-Pay:  DME: 80%     Co-Pay: 20% Providers: Pt's choice  (PTA pt had [facility]) SECONDARY: BCBS (Part D)   Policy#: [id number]   Subscriber: self  Phone#: [phone]      Emergency Contact Information Contact Information    Name Relation Home Work Mobile   Whitt,Lou Daughter [phone]  [phone]     Current Medical History  Patient Admitting Diagnosis: S/P Left BKA due to chronic osteomyelitis and gangreneous L foot.  History of Present Illness: 78 y.o. right-handed female with history of atrial fibrillation on chronic Coumadin therapy, COPD, diastolic congestive heart failure and peripheral vascular disease. Patient was admitted 06/21/2012 with gangrenous left and chronic osteomyelitis. Recent left foot first ray amputation 05/31/2012 and discharge to home 06/01/2012. Noted progressive necrotic gangrenous changes with exposed bone foul-smelling odor. Patient underwent left below knee amputation 06/21/2012 per Dr. Lajoyce Corners. Patient's chronic Coumadin has been resumed. Postoperative anemia 8.3 and monitored.    Past Medical History  Past Medical History  Diagnosis Date  . Lung disease     BRONCHOSPASTIC  . Neuropathy     takes Gabapentin bid  . Hypercholesterolemia     doesn't require meds for this  . Anemia   . COPD (chronic obstructive pulmonary disease)   . Blood transfusion 2012 ?APRIL  . Hypertension     takes Prinizide daily  . Chronic atrial fibrillation     takes Coumadin as  instructed  . Peripheral vascular disease   . CHF (congestive heart failure)     takes Lasix every other day  . Shortness of breath     uses spiriva daily;with exertion  . Emphysema   . Pneumonia     hx of   . History of head injury     many yrs ago  . Arthritis     rheumatoid and takes Methotrexate weekly  . Bruises easily   . Open wound     with a foul odor-left big toe  . History of bladder infections   . Vaginal discharge     has seen GYN but says its nothing to worry about  . History of blood transfusion   . Diabetes mellitus     takes Metformin and Amaryl daily  . Hypothyroidism     takes Synthroid daily  . Macular degeneration     dry   Family History  Family history includes Heart attack in her father; Heart disease in her father and mother; Muscular dystrophy in her brother; and Stroke in her father and mother.  There is no history of Anesthesia problems, and Hypotension, and Malignant hyperthermia, and Pseudochol deficiency, . Prior Rehab/Hospitalizations: 1 month rehab at [facility] after hip surgery  Current Medications  Current facility-administered medications:0.9 %  sodium chloride infusion, , Intravenous, Continuous, Nadara Mustard, MD, Last Rate: 20 mL/hr at 06/21/12 1745;  acetaminophen (TYLENOL) tablet 650 mg, 650 mg, Oral, Q4H PRN, Nadara Mustard, MD, 650 mg at 06/24/12 0845;  folic acid (FOLVITE) tablet 1 mg, 1 mg, Oral, Daily, Nadara Mustard, MD, 1 mg at 06/24/12 1107 furosemide (LASIX) tablet 20 mg, 20 mg, Oral, QODAY, Nadara Mustard, MD, 20 mg at 06/24/12 1107;  gabapentin (NEURONTIN) capsule 300 mg, 300 mg, Oral, BID, Nadara Mustard, MD, 300 mg at 06/24/12 1107;  glimepiride (AMARYL) tablet 2 mg, 2 mg, Oral, QAC breakfast, Nadara Mustard, MD, 2 mg at 06/24/12 0845;  hydrochlorothiazide (HYDRODIURIL) tablet 25 mg, 25 mg, Oral, Daily, Nadara Mustard, MD, 25 mg at 06/24/12 1107 HYDROmorphone (DILAUDID) injection 0.5-1 mg, 0.5-1 mg, Intravenous, Q2H PRN, Nadara Mustard, MD;  levothyroxine (SYNTHROID, LEVOTHROID) tablet 50 mcg, 50 mcg, Oral, Q breakfast, Nadara Mustard, MD, 50 mcg at 06/24/12 1107;  lisinopril (PRINIVIL,ZESTRIL) tablet 20 mg, 20 mg, Oral, Daily, Nadara Mustard, MD, 20 mg at 06/24/12 0845;  metFORMIN (GLUCOPHAGE) tablet 500 mg, 500 mg, Oral, Q breakfast, Nadara Mustard, MD, 500 mg at 06/24/12 0845 methocarbamol (ROBAXIN) 500 mg in dextrose 5 % 50 mL IVPB, 500 mg, Intravenous, Q6H PRN, Nadara Mustard, MD, 500 mg at 06/21/12 1645;  methocarbamol (ROBAXIN) tablet 500 mg, 500 mg, Oral, Q6H PRN, Nadara Mustard, MD, 500 mg at 06/23/12 1828;  metoCLOPramide (REGLAN) injection 5-10 mg, 5-10 mg, Intravenous, Q8H PRN, Nadara Mustard, MD;  metoCLOPramide (REGLAN) tablet 5-10 mg, 5-10 mg, Oral, Q8H PRN, Nadara Mustard, MD multivitamin with minerals tablet 1 tablet, 1 tablet, Oral, Daily, Nadara Mustard, MD, 1 tablet at 06/24/12 1107;  omega-3 acid ethyl esters (LOVAZA) capsule 1 g, 1 g, Oral, Daily, Nadara Mustard, MD, 1 g at 06/24/12 1108;  ondansetron (ZOFRAN) injection 4 mg, 4 mg, Intravenous, Q6H PRN, Nadara Mustard, MD;  ondansetron [facility]) tablet 4 mg, 4 mg, Oral, Q6H PRN, Nadara Mustard, MD tiotropium [facility]) inhalation capsule 18 mcg, 18 mcg, Inhalation, Q breakfast, Nadara Mustard, MD, 18 mcg at 06/24/12 1610;  warfarin (COUMADIN) tablet 2.5 mg, 2.5 mg, Oral, ONCE-1800, Ann Held, PHARMD;  Warfarin - Pharmacist Dosing Inpatient, , Does not apply, q1800, Benny Lennert, PHARMD  Patients Current Diet: Carb Control  Precautions / Restrictions Precautions Precautions: Fall Restrictions Weight Bearing Restrictions: Yes LLE Weight Bearing: Non weight bearing   Prior Activity Level Community (5-7x/wk): Daily outings. Independent and driving until Jan. 9604. Daughter has been assisting pt since then.   Home Assistive Devices / Equipment Home Adaptive Equipment: Walker - rolling;Wheelchair - manual;Other (comment);Grab bars in shower;Built-in shower  seat (transport chsir)  Prior Functional Level Prior Function Level of Independence: Independent with assistive device(s) Able to Take Stairs?: No Driving: No Vocation: Retired  Current Functional Level Cognition  Arousal/Alertness: Awake/alert Overall Cognitive Status: Appears within functional limits for tasks assessed/performed Orientation Level: Oriented X4    Extremity Assessment (includes Sensation/Coordination)  RUE ROM/Strength/Tone: WFL for tasks assessed RUE Sensation: History of peripheral neuropathy RUE Coordination: WFL - gross/fine motor  RLE ROM/Strength/Tone: WFL for tasks assessed RLE Sensation: History of peripheral neuropathy    ADLs  Eating/Feeding: Performed;Modified independent Where Assessed - Eating/Feeding: Bed level Grooming: Performed;Wash/dry face;Modified independent Where Assessed - Grooming: Supported sitting Upper Body Bathing: Set up Where Assessed - Upper Body Bathing: Supported sitting Lower Body Bathing: Moderate assistance Where Assessed - Lower Body Bathing: Rolling right and/or left;Supine, head of bed up Upper Body Dressing: Minimal assistance Where  Assessed - Upper Body Dressing: Unsupported sitting Lower Body Dressing: Maximal assistance Where Assessed - Lower Body Dressing: Rolling right and/or left;Supine, head of bed up Toilet Transfer: Performed;Moderate assistance Toilet Transfer Method: Anterior-posterior (anterior posterior from bed) Toilet Transfer Equipment: Raised toilet seat with arms (or 3-in-1 over toilet) Toileting - Clothing Manipulation and Hygiene: Performed;Maximal assistance Where Assessed - Toileting Clothing Manipulation and Hygiene: Sit to stand from 3-in-1 or toilet Equipment Used: Gait belt;Rolling walker Transfers/Ambulation Related to ADLs: Pt completed anterior-posterior transfer to 3n1 to learn a method that allowed 1 person (A). Pt requesting to learn toilet transfer as soon as possible.Pt completed stand  pivot from 3N1 to chair and demonstrates inability to hop at this time. Pt demonstrates ability to turn to the right but sitting prematurely and required chair scooting closer to patient to allow safe descend to sitting.  ADL Comments: Pt pleasant and eager to learn. Pt complete toilet transfer and progressing well. Pt educated on quad set, glut squeeze and chair push ups for exercises with daughter present. Pt able to recall all three and demonstrate. Pt educated on elevation of Lt LE with knee extension and touching residual limb to decr phatom pain.    Mobility  Bed Mobility: Not assessed Rolling Right: 5: Supervision;With rail Rolling Left: 5: Supervision;With rail Right Sidelying to Sit: 4: Min assist;With rails;HOB flat Supine to Sit: 4: Min assist Supine to Sit: Patient Percentage: 80% Sitting - Scoot to Edge of Bed: 3: Mod assist Sitting - Scoot to [place] of Bed: Patient Percentage: 80%    Transfers  Transfers: Sit to Stand;Stand to Sit (3 trials.) Sit to Stand: 1: +2 Total assist;With upper extremity assist;From chair/3-in-1 Sit to Stand: Patient Percentage: 80% Stand to Sit: 1: +2 Total assist;With upper extremity assist;To chair/3-in-1 Stand to Sit: Patient Percentage: 80% Stand Pivot Transfers: 3: Mod assist Stand Pivot Transfers: Patient Percentage: 50%    Ambulation / Gait / Stairs / Wheelchair Mobility  Ambulation/Gait Ambulation/Gait Assistance: 1: +2 Total assist Ambulation/Gait: Patient Percentage: 40% Ambulation Distance (Feet): 15 Feet (10 feet and 2 feet and 3 feet.) Assistive device: Rolling walker Ambulation/Gait Assistance Details: Assist for balance and to off weight left side due to BKA in order to progress right LE. Max cues for sequence. Pt with tendency to increase lean too far right disrupting balance. Gait Pattern: Step-to pattern;Lateral trunk lean to right;Trunk flexed;Right flexed knee in stance Stairs: No Wheelchair Mobility Wheelchair Mobility: No      Posture / Balance Static Sitting Balance Static Sitting - Balance Support: Feet supported;No upper extremity supported Static Sitting - Level of Assistance: 5: Stand by assistance Static Sitting - Comment/# of Minutes: 10 min Static Standing Balance Static Standing - Balance Support: Bilateral upper extremity supported;During functional activity Static Standing - Level of Assistance: 5: Stand by assistance Static Standing - Comment/# of Minutes: ~2 minutes (performed peri hygiene)     Previous Home Environment Lives With: Family;Alone Available Help at Discharge: Family Type of Home: House Home Layout: One level Home Access: Stairs to enter. Ramp being built to back entrance. Entrance Stairs-Rails: Right;Left;Can reach both Entrance Stairs-Number of Steps: 4 Bathroom Shower/Tub: Publishing copy: Handicapped height Bathroom Accessibility: Yes How Accessible: Accessible via walker  Discharge Living Setting Plans for Discharge Living Setting: Patient's home Type of Home at Discharge: House Discharge Home Layout: One level Discharge Home Access: Ramped entrance Discharge Bathroom Shower/Tub: Walk-in shower Discharge Bathroom Toilet: Handicapped height Discharge Bathroom Accessibility: Yes How Accessible: Accessible via wheelchair  Do you have any problems obtaining your medications?: No  Social/Family/Support Systems Patient Roles: Parent Contact Information: [phone] Anticipated Caregiver: Daughter to stay with pt in pt's home Anticipated Caregiver's Contact Information: [phone] Ability/Limitations of Caregiver: S-Light min Caregiver Availability: 24/7 Discharge Plan Discussed with Primary Caregiver: Yes Is Caregiver In Agreement with Plan?: Yes Does Caregiver/Family have Issues with Lodging/Transportation while Pt is in Rehab?: No  Goals/Additional Needs Patient/Family Goal for Rehab: PT: min A - Mod I, Ot: Mod I Expected length of stay: 7-10  days Cultural Considerations: none Dietary Needs:Carb modified Equipment Needs: TBD Additional Information: Dtr's husband available to A. Pt has son in [place].  Pt/Family Agrees to Admission and willing to participate: Yes Program Orientation Provided & Reviewed with Pt/Caregiver Including Roles  & Responsibilities: Yes  Patient Condition: I have evaluated and met with patient. I have discussed patient with Dr Wynn Banker and have received approval with him for acute inpatient rehab admission today: 06/24/12.  Preadmission Screen Completed By:  Meryl Dare, 06/24/2012 11:21 AM ______________________________________________________________________   Discussed status with Dr. Wynn Banker on 06/24/12 at 11:41 AM and received telephone approval for admission today.  Admission Coordinator:  Meryl Dare, time11:41 AM/Date 06/24/12

## 2012-06-24 NOTE — Progress Notes (Signed)
Patient was evaluated and accepted by CIR today with transfer completed to facility. CSW did not need to intervene with a SNF discussion.  CSW signing off.  Lorri Frederick. [place]  [phone]

## 2012-06-24 NOTE — Discharge Summary (Signed)
Physician Discharge Summary  Patient ID: Sydney [name] MRN: [id number] DOB/AGE: May 31, 1921 78 y.o.  Admit date: 06/21/2012 Discharge date: 06/24/2012  Admission Diagnoses: Gangrene left foot  Discharge Diagnoses: Gangrene left foot Active Problems:  * No active hospital problems. *    Discharged Condition: stable  Hospital Course: Patient is a 78 year old woman who is undergone foot salvage surgery for the left foot. She has had progressive gangrenous changes and presents at this time for transtibial amputation after failure of foot salvage intervention. Patient's hospital course was essentially unremarkable she underwent a transtibial amputation. Postop  patient progressed slowly with therapy and was felt that she would benefit from inpatient rehabilitation and patient was discharged to inpatient rehabilitation in stable condition.  Consults: None  Significant Diagnostic Studies: labs: Routine labs  Treatments: surgery: See operative note  Discharge Exam: Blood pressure 148/85, pulse 115, temperature 98.3 F (36.8 C), temperature source Axillary, resp. rate 16, height 5' 4.96" (1.65 m), weight 59.8 kg (131 lb 13.4 oz), SpO2 97.00%. Incision/Wound: dressing clean dry and intact at time of discharge  Disposition: 03-Skilled Nursing Facility  Discharge Orders    Future Appointments: Provider: Department: Dept Phone: Center:   06/25/2012 7:30 AM Thereasa Parkin, PT [facility]  [address]PAT[address]  REHAB [phone] None   06/25/2012 9:30 AM Coralyn Mark, OT [facility]  [address] INPAT[address]  REHAB [phone] None   06/25/2012 1:00 PM Rich Brave, OTA [facility]  [address] INPAT[address]  [place] [phone] None   06/25/2012 2:15 PM Rexene Agent, PTA [facility]  [address] INPAT[address]  REHAB [phone] None       Medication List     As of 06/24/2012  5:27 PM    TAKE these medications         calcium carbonate  600 MG Tabs   Commonly known as: OS-CAL   Take 600 mg by mouth daily.      [employer] CALCIUM SOFT CHEWS PO   Take 2 tablets by mouth daily.      cyanocobalamin 500 MCG tablet   Take 500 mcg by mouth daily.      doxycycline 100 MG tablet   Commonly known as: VIBRA-TABS   Take 100 mg by mouth 2 (two) times daily.      ferrous fumarate-iron polysaccharide complex 162-115.2 MG Caps   Commonly known as: TANDEM   Take 1 capsule by mouth daily with breakfast.      fish oil-omega-3 fatty acids 1000 MG capsule   Take 1 g by mouth daily.      folic acid 1 MG tablet   Commonly known as: FOLVITE   Take 1 mg by mouth daily.      furosemide 20 MG tablet   Commonly known as: LASIX   Take 1 tablet (20 mg total) by mouth every other day.      gabapentin 300 MG capsule   Commonly known as: NEURONTIN   Take 300 mg by mouth 2 (two) times daily.      glimepiride 4 MG tablet   Commonly known as: AMARYL   Take 2 mg by mouth daily before breakfast.      ICAPS AREDS FORMULA PO   Take 1 capsule by mouth daily with breakfast.      levothyroxine 50 MCG tablet   Commonly known as: SYNTHROID, LEVOTHROID   Take 50 mcg by mouth daily with breakfast.      lisinopril-hydrochlorothiazide 20-25 MG per  tablet   Commonly known as: PRINZIDE,ZESTORETIC   Take 1 tablet by mouth daily with breakfast.      metFORMIN 500 MG tablet   Commonly known as: GLUCOPHAGE   Take 500 mg by mouth daily with breakfast.      METHOTREXATE SODIUM IJ   Inject 0.4 mLs as directed once a week. On tuesday      multivitamin with minerals Tabs   Take 1 tablet by mouth daily.      nitroGLYCERIN 0.2 mg/hr   Commonly known as: NITRODUR - Dosed in mg/24 hr   Place 1 patch onto the skin daily.      silver sulfADIAZINE 1 % cream   Commonly known as: SILVADENE   Apply 1 application topically daily.      tiotropium 18 MCG inhalation capsule   Commonly known as: SPIRIVA   Place 18 mcg into inhaler and inhale daily with  breakfast.      vitamin C 500 MG tablet   Commonly known as: ASCORBIC ACID   Take 500 mg by mouth daily.      Vitamin D3 400 UNITS Caps   Take 1 tablet by mouth daily.      warfarin 5 MG tablet   Commonly known as: COUMADIN   Take 2.5-5 mg by mouth daily. Take 5mg  on Tuesday. Take 2.5mg  the rest of the week         Signed: Kailea Dannemiller V 06/24/2012, 5:27 PM

## 2012-06-24 NOTE — Progress Notes (Signed)
Patient admit to rehab at 1530 with daughter and son-in-law at bedside. Patient very pleasant, alert and oriented, admission packet provide with explanation. Side rails up x3, bed alarm in place, call bell within reach.

## 2012-06-24 NOTE — H&P (Signed)
Physical Medicine and Rehabilitation Admission H&P   CC:  L-BKA due to gangrenous changes.  HPI: Sydney [name] is a 78 y.o. right-handed female with history of atrial fibrillation on chronic Coumadin therapy, COPD, diastolic congestive heart failure and peripheral vascular disease. Patient was admitted 06/21/2012 with gangrenous left and chronic osteomyelitis. Recent left foot first ray amputation 05/31/2012 and discharge to home 06/01/2012. Noted progressive necrotic gangrenous changes with exposed bone foul-smelling odor. Patient underwent left below knee amputation 06/21/2012 per Dr. Lajoyce Corners. Post op chronic coumadin resumed. Has acute on chronic anemia with Hgb @ 8.3  Review of Systems  HENT: Negative for hearing loss.   Eyes: Negative for blurred vision and double vision.  Respiratory: Negative for cough and shortness of breath.   Cardiovascular: Negative for chest pain and palpitations.  Gastrointestinal: Negative for heartburn, abdominal pain and constipation.  Genitourinary: Negative for urgency and frequency.  Musculoskeletal: Negative for myalgias.  Neurological: Negative for sensory change, focal weakness and headaches.  Psychiatric/Behavioral: Positive for memory loss. Negative for depression. The patient does not have insomnia.     Past Medical History  Diagnosis Date  . Lung disease     BRONCHOSPASTIC  . Neuropathy     takes Gabapentin bid  . Hypercholesterolemia     doesn't require meds for this  . Anemia   . COPD (chronic obstructive pulmonary disease)   . Blood transfusion 2012 ?APRIL  . Hypertension     takes Prinizide daily  . Chronic atrial fibrillation     takes Coumadin as instructed  . Peripheral vascular disease   . CHF (congestive heart failure)     takes Lasix every other day  . Shortness of breath     uses spiriva daily;with exertion  . Emphysema   . Pneumonia     hx of   . History of head injury     many yrs ago  . Arthritis     rheumatoid and  takes Methotrexate weekly  . Bruises easily   . Open wound     with a foul odor-left big toe  . History of bladder infections   . Vaginal discharge     has seen GYN but says its nothing to worry about  . History of blood transfusion   . Diabetes mellitus     takes Metformin and Amaryl daily  . Hypothyroidism     takes Synthroid daily  . Macular degeneration     dry   Past Surgical History  Procedure Date  . Total hip arthroplasty     LEFT HIP  . Back surgery   . Fracture surgery 2011    LEFT HIP FX /REPLACEMENT   . Joint replacement     HIP LEFT   . Cholecystectomy 1951  . Dilation and curettage of uterus   . Abdominal hysterectomy 1975  . Amputation 11/09/2011    Procedure: AMPUTATION DIGIT;  Surgeon: Kathryne Hitch, MD;  Location: [facility] OR;  Service: Orthopedics;  Laterality: Right;  Right Great Toe Amputation  . Eye surgery     bilateral -cataract surgery  . Amputation 01/05/2012    Procedure: AMPUTATION RAY;  Surgeon: Kathryne Hitch, MD;  Location: [facility] ORS;  Service: Orthopedics;  Laterality: Left;  Amputation Left 4th and 5th Toes  . Cardioversion   . Amputation 05/31/2012    Procedure: AMPUTATION RAY;  Surgeon: Nadara Mustard, MD;  Location: [facility] OR;  Service: Orthopedics;  Laterality: Left;  Left foot 1st ray amp  .  Amputation 06/21/2012    Procedure: AMPUTATION BELOW KNEE;  Surgeon: Nadara Mustard, MD;  Location: [facility] OR;  Service: Orthopedics;  Laterality: Left;  Left Below Knee Amputation   Family History  Problem Relation Age of Onset  . Heart disease Mother   . Stroke Mother   . Heart disease Father   . Stroke Father   . Heart attack Father   . Muscular dystrophy Brother   . Anesthesia problems Neg Hx   . Hypotension Neg Hx   . Malignant hyperthermia Neg Hx   . Pseudochol deficiency Neg Hx    Social History:  reports that she has quit smoking. Her smoking use included Cigarettes. She quit smokeless tobacco use about 31 years ago. She reports that she  does not drink alcohol or use illicit drugs. Allergies:  Allergies  Allergen Reactions  . Other     Narcotics=makes her very disoriented  . Percocet (Oxycodone-Acetaminophen) Other (See Comments)    Dilerium, hallucinations  . Tramadol Anxiety  . Vicodin (Hydrocodone-Acetaminophen) Anxiety   Medications Prior to Admission  Medication Sig Dispense Refill  . calcium carbonate (OS-CAL) 600 MG TABS Take 600 mg by mouth daily.      . Calcium-Vitamin D-Vitamin K ([employer] CALCIUM SOFT CHEWS PO) Take 2 tablets by mouth daily.      . Cholecalciferol (VITAMIN D3) 400 UNITS CAPS Take 1 tablet by mouth daily.        . cyanocobalamin 500 MCG tablet Take 500 mcg by mouth daily.      Marland Kitchen doxycycline (VIBRA-TABS) 100 MG tablet Take 100 mg by mouth 2 (two) times daily.      . ferrous fumarate-iron polysaccharide complex (TANDEM) 162-115.2 MG CAPS Take 1 capsule by mouth daily with breakfast.      . fish oil-omega-3 fatty acids 1000 MG capsule Take 1 g by mouth daily.      . folic acid (FOLVITE) 1 MG tablet Take 1 mg by mouth daily.      . furosemide (LASIX) 20 MG tablet Take 1 tablet (20 mg total) by mouth every other day.  30 tablet  3  . gabapentin (NEURONTIN) 300 MG capsule Take 300 mg by mouth 2 (two) times daily.       Marland Kitchen glimepiride (AMARYL) 4 MG tablet Take 2 mg by mouth daily before breakfast.       . levothyroxine (SYNTHROID, LEVOTHROID) 50 MCG tablet Take 50 mcg by mouth daily with breakfast.       . lisinopril-hydrochlorothiazide (PRINZIDE,ZESTORETIC) 20-25 MG per tablet Take 1 tablet by mouth daily with breakfast.       . metFORMIN (GLUCOPHAGE) 500 MG tablet Take 500 mg by mouth daily with breakfast.       . METHOTREXATE SODIUM IJ Inject 0.4 mLs as directed once a week. On tuesday      . Multiple Vitamin (MULITIVITAMIN WITH MINERALS) TABS Take 1 tablet by mouth daily.      . Multiple Vitamins-Minerals (ICAPS AREDS FORMULA PO) Take 1 capsule by mouth daily with breakfast.      . nitroGLYCERIN  (NITRODUR - DOSED IN MG/24 HR) 0.2 mg/hr Place 1 patch onto the skin daily.       . silver sulfADIAZINE (SILVADENE) 1 % cream Apply 1 application topically daily.      Marland Kitchen tiotropium (SPIRIVA) 18 MCG inhalation capsule Place 18 mcg into inhaler and inhale daily with breakfast.       . vitamin C (ASCORBIC ACID) 500 MG tablet Take 500 mg  by mouth daily.      Marland Kitchen warfarin (COUMADIN) 5 MG tablet Take 2.5-5 mg by mouth daily. Take 5mg  on Tuesday. Take 2.5mg  the rest of the week        Home:     Functional History:    Functional Status:  Mobility:          ADL:    Cognition: Cognition Orientation Level: Oriented X4     Blood pressure 134/73, pulse 84, temperature 98.2 F (36.8 C), temperature source Oral, resp. rate 16, SpO2 97.00%. Physical Exam  Nursing note and vitals reviewed. Constitutional: She is oriented to person, place, and time. She appears well-developed and well-nourished.  HENT:  Head: Normocephalic and atraumatic.  Eyes: Pupils are equal, round, and reactive to light.  Neck: Normal range of motion. Neck supple.  Cardiovascular: Normal rate.  An irregularly irregular rhythm present.  Pulmonary/Chest: Effort normal and breath sounds normal.  Abdominal: Soft. Bowel sounds are normal.  Musculoskeletal: She exhibits no edema and no tenderness.       L-BKA with compressive dressing in place. R foot with well healed 1st, 2nd toe amputation site. Dry flaky skin on RLE. Moves BUE and RLE without difficulty.   Neurological: She is alert and oriented to person, place, and time.  Skin: Skin is warm and dry.  4/5 strength in bilateral deltoid, biceps, triceps, grip 4/5 in the right hip flexor knee extensor 3 minus in the ankle dorsiflexor plantar flexor Left hip flexor 4/5 Sensation is absent in the right foot  Results for orders placed during the hospital encounter of 06/24/12 (from the past 48 hour(s))  GLUCOSE, CAPILLARY     Status: Abnormal   Collection Time   06/24/12   5:41 PM      Component Value Range Comment   Glucose-Capillary 68 (*) 70 - 99 mg/dL   GLUCOSE, CAPILLARY     Status: Abnormal   Collection Time   06/24/12  6:45 PM      Component Value Range Comment   Glucose-Capillary 150 (*) 70 - 99 mg/dL    Comment 1 Notify RN      No results found.  Post Admission Physician Evaluation: 1. Functional deficits secondary  to Left below-knee amputation due to gangrene as well as diabetes with severe peripheral neuropathy. 2. Patient is admitted to receive collaborative, interdisciplinary care between the physiatrist, rehab nursing staff, and therapy team. 3. Patient's level of medical complexity and substantial therapy needs in context of that medical necessity cannot be provided at a lesser intensity of care such as a SNF. 4. Patient has experienced substantial functional loss from his/her baseline which was documented above under the "Functional History" and "Functional Status" headings.  Judging by the patient's diagnosis, physical exam, and functional history, the patient has potential for functional progress which will result in measurable gains while on inpatient rehab.  These gains will be of substantial and practical use upon discharge  in facilitating mobility and self-care at the household level. 5. Physiatrist will provide 24 hour management of medical needs as well as oversight of the therapy plan/treatment and provide guidance as appropriate regarding the interaction of the two. 6. 24 hour rehab nursing will assist with bladder management, bowel management, safety, skin/wound care, disease management, medication administration, pain management and patient education  and help integrate therapy concepts, techniques,education, etc. 7. PT will assess and treat for:  Pre-gait training, limited gait training, endurance, safety, equipment.  Goals are: Supervision to modified independent level mobility.  8. OT will assess and treat for: ADLs, cognitive  perceptual skills, endurance, safety awareness, equipped.   Goals are: Supervision to modified independent level with ADLs. 9. SLP will assess and treat for: Not applicable.  Goals are: Not applicable. 10. Case Management and Social Worker will assess and treat for psychological issues and discharge planning. 11. Team conference will be held weekly to assess progress toward goals and to determine barriers to discharge. 12. Patient will receive at least 3 hours of therapy per day at least 5 days per week. 13. ELOS: 7-10 days      Prognosis:  good   Medical Problem List and Plan: 1. DVT Prophylaxis/Anticoagulation: Pharmaceutical: Coumadin 2. Pain Management: prn medications effective. 3. Mood:  Has a good outlook overall. LCSW to follow up for formal evaluation.  4. Neuropsych: This patient is capable of making decisions on his/her own behalf. 5. DM type 2: monitor with AC/HS CBG checks.  Continue metformin and amaryl daily. Use SSI for elevated BS.  6. Atrial Fibrillation: monitor HR with bid checks. Continue coumadin. 7. COPD: continue spirivia for symptom management.  8. HTN: Will monitor with bid checks. Will need to watch renal status with two diuretics on board. Continue HCTZ, lasix and lisinopril.  9. ABLA: add iron supplement.   06/24/2012, 7:06 PM

## 2012-06-24 NOTE — Progress Notes (Signed)
Patient ID: Sydney [name], female   DOB: 01-08-1921, 78 y.o.   MRN: [id number] Patient comfortable this morning no complaints. Inpatient rehabilitation consulted for possible inpatient therapy.

## 2012-06-24 NOTE — Progress Notes (Signed)
Occupational Therapy Treatment Patient Details Name: Sydney [name] MRN: [id number] DOB: May 27, 1921 Today's Date: 06/24/2012 Time: 4782-9562 OT Time Calculation (min): 34 min  OT Assessment / Plan / Recommendation Comments on Treatment Session Pt progressing well and excellent rehab candidate. Pt completed 3n1 transfer and recalls 3 out 3 exercises. Pt could reach MOD I with CIR stay    Follow Up Recommendations  CIR    Barriers to Discharge       Equipment Recommendations  None recommended by OT    Recommendations for Other Services Rehab consult  Frequency     Plan Discharge plan remains appropriate    Precautions / Restrictions Precautions Precautions: Fall Restrictions LLE Weight Bearing: Non weight bearing   Pertinent Vitals/Pain Reports itching, tingling and phatom pains Pt states doesn't really hurt per say but it just bothers me    ADL  Eating/Feeding: Performed;Modified independent Where Assessed - Eating/Feeding: Bed level Grooming: Performed;Wash/dry face;Modified independent Where Assessed - Grooming: Supported sitting Toilet Transfer: Performed;Moderate assistance Toilet Transfer Method: Anterior-posterior (anterior posterior from bed) Toilet Transfer Equipment: Raised toilet seat with arms (or 3-in-1 over toilet) Toileting - Clothing Manipulation and Hygiene: Performed;Maximal assistance Where Assessed - Toileting Clothing Manipulation and Hygiene: Sit to stand from 3-in-1 or toilet Equipment Used: Gait belt;Rolling walker Transfers/Ambulation Related to ADLs: Pt completed anterior-posterior transfer to 3n1 to learn a method that allowed 1 person (A). Pt requesting to learn toilet transfer as soon as possible.Pt completed stand pivot from 3N1 to chair and demonstrates inability to hop at this time. Pt demonstrates ability to turn to the right but sitting prematurely and required chair scooting closer to patient to allow safe descend to sitting.  ADL Comments: Pt  pleasant and eager to learn. Pt complete toilet transfer and progressing well. Pt educated on quad set, glut squeeze and chair push ups for exercises with daughter present. Pt able to recall all three and demonstrate. Pt educated on elevation of Lt LE with knee extension and touching residual limb to decr phatom pain.    OT Diagnosis:    OT Problem List:   OT Treatment Interventions:     OT Goals Acute Rehab OT Goals OT Goal Formulation: With patient Time For Goal Achievement: 07/07/12 Potential to Achieve Goals: Good ADL Goals Pt Will Perform Upper Body Bathing: with set-up;Sitting at sink;Unsupported Pt Will Perform Lower Body Bathing: with supervision;Sitting at sink;Standing at sink;Unsupported;with cueing (comment type and amount) Pt Will Perform Upper Body Dressing: with set-up;Sitting, chair Pt Will Perform Lower Body Dressing: Sit to stand from chair;Unsupported;Other (comment);with supervision Pt Will Transfer to Toilet: with min assist;Squat pivot transfer;Drop arm 3-in-1;Maintaining weight bearing status ADL Goal: Toilet Transfer - Progress: Progressing toward goals Pt Will Perform Toileting - Clothing Manipulation: with supervision;Sitting on 3-in-1 or toilet;with cueing (comment type and amount) ADL Goal: Toileting - Clothing Manipulation - Progress: Progressing toward goals Pt Will Perform Toileting - Hygiene: with modified independence;Leaning right and/or left on 3-in-1/toilet;Sitting on 3-in-1 or toilet ADL Goal: Toileting - Hygiene - Progress: Progressing toward goals Arm Goals Pt Will Complete Theraband Exer: with supervision, verbal cues required/provided;2 sets;Bilateral upper extremities;to increase strength;Level 2 Theraband (pt needs to be provided theraband next session)  Visit Information  Last OT Received On: 06/24/12 Assistance Needed: +1    Subjective Data      Prior Functioning       Cognition  Overall Cognitive Status: Appears within functional  limits for tasks assessed/performed Arousal/Alertness: Awake/alert Orientation Level: Appears intact for tasks assessed  Behavior During Session: [facility] for tasks performed    Mobility  Shoulder Instructions Bed Mobility Supine to Sit: 4: Min assist Sitting - Scoot to Edge of Bed: 3: Mod assist Details for Bed Mobility Assistance: Pt using bil UE to scoot toward the EOB. Pt required (A) of pad. Recommend next session to just attempt stand pivot with RW. Pt demonstrates anterior posterior this session from bed level. Pt could benefit from anterior posterior to teach independence due to urgency but the goal for d/c will be stand pivot. Transfers Transfers: Stand to Sit Stand to Sit: 3: Mod assist;Without upper extremity assist;To chair/3-in-1 Details for Transfer Assistance: Mod v/c for positioning and hand placement. pt able to static stand Min Guard this session. Pt is unable to transfer / hop with RW at this time .        Exercises  Amputee Exercises Quad Sets: AROM;15 reps;Seated;Left Gluteal Sets: 15 reps;AROM;Left;Seated Knee Extension: AROM;15 reps;Seated;Left   Balance Static Standing Balance Static Standing - Balance Support: Bilateral upper extremity supported;During functional activity Static Standing - Level of Assistance: 5: Stand by assistance Static Standing - Comment/# of Minutes: ~2 minutes (performed peri hygiene)   End of Session OT - End of Session Equipment Utilized During Treatment: Gait belt Activity Tolerance: Patient tolerated treatment well Patient left: in chair;with call bell/phone within reach;with family/visitor present Nurse Communication: Mobility status;Precautions  GO     Lucile Shutters 06/24/2012, 9:01 AM Pager: [phone]

## 2012-06-24 NOTE — Progress Notes (Addendum)
Physical Therapy Treatment Patient Details Name: Sydney [name] MRN: [id number] DOB: Jun 29, 1921 Today's Date: 06/24/2012 Time: 0933-1000 PT Time Calculation (min): 27 min  PT Assessment / Plan / Recommendation Comments on Treatment Session  Pt admitted s/p left BKA and continues to progress with therapy. Pt able to progress toward ambulation today requiring 2 person assist due to balance and decreased strength. Motivated. Great CIR candidate.    Follow Up Recommendations  CIR;Supervision/Assistance - 24 hour     Does the patient have the potential to tolerate intense rehabilitation     Barriers to Discharge        Equipment Recommendations  None recommended by PT    Recommendations for Other Services    Frequency Min 5X/week   Plan Discharge plan remains appropriate;Frequency remains appropriate    Precautions / Restrictions Precautions Precautions: Fall Restrictions Weight Bearing Restrictions: Yes LLE Weight Bearing: Non weight bearing   Pertinent Vitals/Pain None    Mobility  Bed Mobility Bed Mobility: Not assessed Transfers Transfers: Sit to Stand;Stand to Sit (3 trials.) Sit to Stand: 1: +2 Total assist;With upper extremity assist;From chair/3-in-1 Sit to Stand: Patient Percentage: 80% Stand to Sit: 1: +2 Total assist;With upper extremity assist;To chair/3-in-1 Stand to Sit: Patient Percentage: 80% Details for Transfer Assistance: Assist to translate trunk anterior over BOS with cues for safest hand placement. Ambulation/Gait Ambulation/Gait Assistance: 1: +2 Total assist Ambulation/Gait: Patient Percentage: 40% Ambulation Distance (Feet): 15 Feet (10 feet and 2 feet and 3 feet.) Assistive device: Rolling walker Ambulation/Gait Assistance Details: Assist for balance and to off weight left side due to BKA in order to progress right LE. Max cues for sequence. Pt with tendency to increase lean too far right disrupting balance. Gait Pattern: Step-to pattern;Lateral  trunk lean to right;Trunk flexed;Right flexed knee in stance Stairs: No Wheelchair Mobility Wheelchair Mobility: No    Exercises    PT Diagnosis:    PT Problem List:   PT Treatment Interventions:     PT Goals Acute Rehab PT Goals PT Goal Formulation: With patient Time For Goal Achievement: 06/22/12 Potential to Achieve Goals: Good PT Goal: Sit to Stand - Progress: Progressing toward goal PT Goal: Stand to Sit - Progress: Progressing toward goal PT Goal: Ambulate - Progress: Progressing toward goal  Visit Information  Last PT Received On: 06/24/12 Assistance Needed: +2    Subjective Data  Subjective: "I am doing great this morning." Patient Stated Goal: To eventually go home   Cognition  Overall Cognitive Status: Appears within functional limits for tasks assessed/performed Arousal/Alertness: Awake/alert Orientation Level: Appears intact for tasks assessed Behavior During Session: [facility] for tasks performed    Balance  Balance Balance Assessed: No  End of Session PT - End of Session Equipment Utilized During Treatment: Gait belt Activity Tolerance: Patient tolerated treatment well Patient left: in chair;with call bell/phone within reach;with family/visitor present Nurse Communication: Mobility status   GP     Cephus Shelling 06/24/2012, 10:38 AM  06/24/2012 Cephus Shelling, PT, DPT [phone]

## 2012-06-24 NOTE — Progress Notes (Signed)
Admitting pt to CIR today. Met with patient and her daughter at bedside this AM to discuss CIR. Pt would benefit from CIR prior to return to her home with daughter to assist. Pt eager to come to inpatient rehab. Dr Lajoyce Corners in agreement with plan to d/c pt to CIR today.  Call for questions: [phone].

## 2012-06-24 NOTE — Progress Notes (Addendum)
Overall Plan of Care [facility]) Patient Details Name: Sydney [name] MRN: [id number] DOB: 1921-02-27  Diagnosis:    Primary Diagnosis:    Unilateral complete BKA Co-morbidities: anemia, neuropahty, copd, htn,  Functional Problem List  Patient demonstrates impairments in the following areas: Balance, Bladder, Bowel, Endurance, Medication Management, Motor, Nutrition, Pain, Safety, Sensory , Skin Integrity and Vision  Basic ADL's: grooming, bathing, dressing and toileting Advanced ADL's: simple meal preparation  Transfers:  bed mobility, bed to chair, toilet, tub/shower, car and furniture Locomotion:  ambulation and wheelchair mobility  Additional Impairments:  Leisure Awareness  Anticipated Outcomes Item Anticipated Outcome  Eating/Swallowing    Basic self-care  Supervision  Tolieting  Supervision  Bowel/Bladder  Remain continent of bowel/bladder  Transfers  Supervision  Locomotion  Supervision  Communication    Cognition    Pain  3 or less on scale 0-10  Safety/Judgment    Other     Therapy Plan: PT Frequency: 2-3 X/day, 60-90 minutes OT Frequency: 1-2 X/day, 60-90 minutes     Team Interventions: Item RN PT OT SLP SW TR Other  Self Care/Advanced ADL Retraining  x x      Neuromuscular Re-Education  x x      Therapeutic Activities  x x   x   UE/LE Strength Training/ROM  x x   x   UE/LE Coordination Activities  x x   x   Visual/Perceptual Remediation/Compensation         DME/Adaptive Equipment Instruction  x x   x   Therapeutic Exercise  x x   x   Balance/Vestibular Training  x x   x   Patient/Family Education x x x   x   Cognitive Remediation/Compensation         Functional Mobility Training  x x   x   Ambulation/Gait Training  x x      Engineer, structural Propulsion/Positioning  x x   x   Functional Tourist information centre manager Reintegration  x x   x   Dysphagia/Aspiration Musician         Bladder Management x        Bowel Management x        Disease Management/Prevention  x x      Pain Management x x       Medication Management x  x      Skin Care/Wound Management x x x      Splinting/Orthotics  x x      Discharge Planning  x x   x   Psychosocial Support  x x   x                      Team Discharge Planning: Destination:  Home Projected Follow-up: PT, OT and Home Health Projected Equipment Needs:  TBD Patient/family involved in discharge planning:  Yes  MD ELOS: 11 DAYS  Medical Rehab Prognosis:  Excellent Assessment: Pt admitted for CIR therapies. The team will be addressing basic mobility, self-care, pain mgt, pre-pros ed, safety, adaptive equipment and techniques with supervision goals set.

## 2012-06-25 ENCOUNTER — Inpatient Hospital Stay (HOSPITAL_COMMUNITY): Payer: Medicare Other

## 2012-06-25 ENCOUNTER — Inpatient Hospital Stay (HOSPITAL_COMMUNITY): Payer: Medicare Other | Admitting: Physical Therapy

## 2012-06-25 ENCOUNTER — Inpatient Hospital Stay (HOSPITAL_COMMUNITY): Payer: Medicare Other | Admitting: Occupational Therapy

## 2012-06-25 DIAGNOSIS — L98499 Non-pressure chronic ulcer of skin of other sites with unspecified severity: Secondary | ICD-10-CM

## 2012-06-25 DIAGNOSIS — S88119A Complete traumatic amputation at level between knee and ankle, unspecified lower leg, initial encounter: Secondary | ICD-10-CM

## 2012-06-25 DIAGNOSIS — I739 Peripheral vascular disease, unspecified: Secondary | ICD-10-CM

## 2012-06-25 LAB — CBC WITH DIFFERENTIAL/PLATELET
Eosinophils Relative: 5 % (ref 0–5)
HCT: 24.9 % — ABNORMAL LOW (ref 36.0–46.0)
Hemoglobin: 7.9 g/dL — ABNORMAL LOW (ref 12.0–15.0)
Lymphocytes Relative: 21 % (ref 12–46)
MCHC: 31.7 g/dL (ref 30.0–36.0)
MCV: 89.6 fL (ref 78.0–100.0)
Monocytes Absolute: 0.4 10*3/uL (ref 0.1–1.0)
Monocytes Relative: 9 % (ref 3–12)
Neutro Abs: 3.2 10*3/uL (ref 1.7–7.7)
WBC: 5 10*3/uL (ref 4.0–10.5)

## 2012-06-25 LAB — COMPREHENSIVE METABOLIC PANEL
BUN: 19 mg/dL (ref 6–23)
CO2: 27 mEq/L (ref 19–32)
Chloride: 99 mEq/L (ref 96–112)
Creatinine, Ser: 0.88 mg/dL (ref 0.50–1.10)
GFR calc Af Amer: 66 mL/min — ABNORMAL LOW (ref >90)
GFR calc non Af Amer: 57 mL/min — ABNORMAL LOW (ref >90)
Total Bilirubin: 0.3 mg/dL (ref 0.3–1.2)

## 2012-06-25 LAB — GLUCOSE, CAPILLARY
Glucose-Capillary: 108 mg/dL — ABNORMAL HIGH (ref 70–99)
Glucose-Capillary: 177 mg/dL — ABNORMAL HIGH (ref 70–99)
Glucose-Capillary: 158 mg/dL — ABNORMAL HIGH (ref 70–99)
Glucose-Capillary: 183 mg/dL — ABNORMAL HIGH (ref 70–99)

## 2012-06-25 MED ORDER — BENEPROTEIN PO POWD
1.0000 | Freq: Three times a day (TID) | ORAL | Status: DC
  Administered 2012-06-25 – 2012-07-05 (×28): 6 g via ORAL
  Filled 2012-06-25: qty 227

## 2012-06-25 MED ORDER — INFLUENZA VIRUS VACC SPLIT PF IM SUSP
0.5000 mL | INTRAMUSCULAR | Status: AC | PRN
  Administered 2012-07-05: 0.5 mL via INTRAMUSCULAR
  Filled 2012-06-25 (×2): qty 0.5

## 2012-06-25 MED ORDER — WARFARIN SODIUM 2.5 MG PO TABS
2.5000 mg | ORAL_TABLET | Freq: Once | ORAL | Status: AC
  Administered 2012-06-25: 2.5 mg via ORAL
  Filled 2012-06-25: qty 1

## 2012-06-25 NOTE — Progress Notes (Signed)
ANTICOAGULATION CONSULT NOTE - Follow up  Consult  Pharmacy Consult for coumadin Indication: VTE prophylaxis, h/o afib  Allergies  Allergen Reactions  . Other     Narcotics=makes her very disoriented  . Percocet (Oxycodone-Acetaminophen) Other (See Comments)    Dilerium, hallucinations  . Tramadol Anxiety  . Vicodin (Hydrocodone-Acetaminophen) Anxiety    Patient Measurements: Wt= 59.8kg  Vital Signs: Temp: 97.9 F (36.6 C) (11/12 0459) Temp src: Oral (11/12 0459) BP: 147/76 mmHg (11/12 0459) Pulse Rate: 61  (11/12 0459)  Labs:  Basename 06/25/12 1028 06/25/12 0659 06/24/12 0610 06/23/12 0649  HGB -- 7.9* -- --  HCT -- 24.9* -- --  PLT -- 237 -- --  APTT -- -- -- --  LABPROT 23.5* -- 29.8* 31.3*  INR 2.20* -- 3.03* 3.24*  HEPARINUNFRC -- -- -- --  CREATININE -- 0.88 -- --  CKTOTAL -- -- -- --  CKMB -- -- -- --  TROPONINI -- -- -- --    Medical History: Past Medical History  Diagnosis Date  . Lung disease     BRONCHOSPASTIC  . Neuropathy     takes Gabapentin bid  . Hypercholesterolemia     doesn't require meds for this  . Anemia   . COPD (chronic obstructive pulmonary disease)   . Blood transfusion 2012 ?APRIL  . Hypertension     takes Prinizide daily  . Chronic atrial fibrillation     takes Coumadin as instructed  . Peripheral vascular disease   . CHF (congestive heart failure)     takes Lasix every other day  . Shortness of breath     uses spiriva daily;with exertion  . Emphysema   . Pneumonia     hx of   . History of head injury     many yrs ago  . Arthritis     rheumatoid and takes Methotrexate weekly  . Bruises easily   . Open wound     with a foul odor-left big toe  . History of bladder infections   . Vaginal discharge     has seen GYN but says its nothing to worry about  . History of blood transfusion   . Diabetes mellitus     takes Metformin and Amaryl daily  . Hypothyroidism     takes Synthroid daily  . Macular degeneration    dry    Medications:  Prescriptions prior to admission  Medication Sig Dispense Refill  . calcium carbonate (OS-CAL) 600 MG TABS Take 600 mg by mouth daily.      . Calcium-Vitamin D-Vitamin K ([employer] CALCIUM SOFT CHEWS PO) Take 2 tablets by mouth daily.      . Cholecalciferol (VITAMIN D3) 400 UNITS CAPS Take 1 tablet by mouth daily.        . cyanocobalamin 500 MCG tablet Take 500 mcg by mouth daily.      Marland Kitchen doxycycline (VIBRA-TABS) 100 MG tablet Take 100 mg by mouth 2 (two) times daily.      . ferrous fumarate-iron polysaccharide complex (TANDEM) 162-115.2 MG CAPS Take 1 capsule by mouth daily with breakfast.      . fish oil-omega-3 fatty acids 1000 MG capsule Take 1 g by mouth daily.      . folic acid (FOLVITE) 1 MG tablet Take 1 mg by mouth daily.      . furosemide (LASIX) 20 MG tablet Take 1 tablet (20 mg total) by mouth every other day.  30 tablet  3  . gabapentin (NEURONTIN) 300 MG  capsule Take 300 mg by mouth 2 (two) times daily.       Marland Kitchen glimepiride (AMARYL) 4 MG tablet Take 2 mg by mouth daily before breakfast.       . levothyroxine (SYNTHROID, LEVOTHROID) 50 MCG tablet Take 50 mcg by mouth daily with breakfast.       . lisinopril-hydrochlorothiazide (PRINZIDE,ZESTORETIC) 20-25 MG per tablet Take 1 tablet by mouth daily with breakfast.       . metFORMIN (GLUCOPHAGE) 500 MG tablet Take 500 mg by mouth daily with breakfast.       . METHOTREXATE SODIUM IJ Inject 0.4 mLs as directed once a week. On tuesday      . Multiple Vitamin (MULITIVITAMIN WITH MINERALS) TABS Take 1 tablet by mouth daily.      . Multiple Vitamins-Minerals (ICAPS AREDS FORMULA PO) Take 1 capsule by mouth daily with breakfast.      . nitroGLYCERIN (NITRODUR - DOSED IN MG/24 HR) 0.2 mg/hr Place 1 patch onto the skin daily.       . silver sulfADIAZINE (SILVADENE) 1 % cream Apply 1 application topically daily.      Marland Kitchen tiotropium (SPIRIVA) 18 MCG inhalation capsule Place 18 mcg into inhaler and inhale daily with breakfast.         . vitamin C (ASCORBIC ACID) 500 MG tablet Take 500 mg by mouth daily.      Marland Kitchen warfarin (COUMADIN) 5 MG tablet Take 2.5-5 mg by mouth daily. Take 5mg  on Tuesday. Take 2.5mg  the rest of the week        Assessment: 78 y/o female patient resumed on warfarin s/p L BKA on 11/8 for hx Afib and VTE px s/p procedure. INR this morning is therapeutic.  PTA the patient was noted to be taking 2.5 mg daily EXCEPT for 5 mg on Tuesdays only. The patient has been re-educated on warfarin this admission.   Goal of Therapy:  INR 2-3  Plan:  Repeat coumadin 2.5mg  today and f/u daily protime.  Verlene Mayer, PharmD, BCPS Pager [phone] 06/25/2012 1:14 PM

## 2012-06-25 NOTE — Progress Notes (Addendum)
Patient ID: Sydney [name], female   DOB: 1921-07-25, 78 y.o.   MRN: [id number] Sydney [name] is a 78 y.o. right-handed female with history of atrial fibrillation on chronic Coumadin therapy, COPD, diastolic congestive heart failure and peripheral vascular disease. Patient was admitted 06/21/2012 with gangrenous left and chronic osteomyelitis. Recent left foot first ray amputation 05/31/2012 and discharge to home 06/01/2012. Noted progressive necrotic gangrenous changes with exposed bone foul-smelling odor. Patient underwent left below knee amputation 06/21/2012 per Dr. Lajoyce Corners. Post op chronic coumadin resumed.  Subjective/Complaints:   Objective: Vital Signs: Blood pressure 147/76, pulse 61, temperature 97.9 F (36.6 C), temperature source Oral, resp. rate 18, SpO2 98.00%. No results found. Results for orders placed during the hospital encounter of 06/24/12 (from the past 72 hour(s))  GLUCOSE, CAPILLARY     Status: Abnormal   Collection Time   06/24/12  5:41 PM      Component Value Range Comment   Glucose-Capillary 68 (*) 70 - 99 mg/dL   GLUCOSE, CAPILLARY     Status: Abnormal   Collection Time   06/24/12  6:45 PM      Component Value Range Comment   Glucose-Capillary 150 (*) 70 - 99 mg/dL    Comment 1 Notify RN     GLUCOSE, CAPILLARY     Status: Abnormal   Collection Time   06/24/12  8:23 PM      Component Value Range Comment   Glucose-Capillary 163 (*) 70 - 99 mg/dL    Comment 1 Notify RN     GLUCOSE, CAPILLARY     Status: Abnormal   Collection Time   06/25/12  6:45 AM      Component Value Range Comment   Glucose-Capillary 108 (*) 70 - 99 mg/dL   CBC WITH DIFFERENTIAL     Status: Abnormal   Collection Time   06/25/12  6:59 AM      Component Value Range Comment   WBC 5.0  4.0 - 10.5 K/uL    RBC 2.78 (*) 3.87 - 5.11 MIL/uL    Hemoglobin 7.9 (*) 12.0 - 15.0 g/dL    HCT 78.2 (*) 95.6 - 46.0 %    MCV 89.6  78.0 - 100.0 fL    MCH 28.4  26.0 - 34.0 pg    MCHC 31.7  30.0 - 36.0 g/dL     RDW 21.3 (*) 08.6 - 15.5 %    Platelets 237  150 - 400 K/uL    Neutrophils Relative 64  43 - 77 %    Neutro Abs 3.2  1.7 - 7.7 K/uL    Lymphocytes Relative 21  12 - 46 %    Lymphs Abs 1.1  0.7 - 4.0 K/uL    Monocytes Relative 9  3 - 12 %    Monocytes Absolute 0.4  0.1 - 1.0 K/uL    Eosinophils Relative 5  0 - 5 %    Eosinophils Absolute 0.3  0.0 - 0.7 K/uL    Basophils Relative 0  0 - 1 %    Basophils Absolute 0.0  0.0 - 0.1 K/uL   COMPREHENSIVE METABOLIC PANEL     Status: Abnormal   Collection Time   06/25/12  6:59 AM      Component Value Range Comment   Sodium 137  135 - 145 mEq/L    Potassium 3.7  3.5 - 5.1 mEq/L    Chloride 99  96 - 112 mEq/L    CO2 27  19 - 32  mEq/L    Glucose, Bld 112 (*) 70 - 99 mg/dL    BUN 19  6 - 23 mg/dL    Creatinine, Ser 4.09  0.50 - 1.10 mg/dL    Calcium 8.6  8.4 - 81.1 mg/dL    Total Protein 6.1  6.0 - 8.3 g/dL    Albumin 2.8 (*) 3.5 - 5.2 g/dL    AST 17  0 - 37 U/L    ALT <5  0 - 35 U/L    Alkaline Phosphatase 63  39 - 117 U/L    Total Bilirubin 0.3  0.3 - 1.2 mg/dL    GFR calc non Af Amer 57 (*) >90 mL/min    GFR calc Af Amer 66 (*) >90 mL/min      HEENT: normal Cardio: RRR Resp: CTA B/L GI: BS positive Extremity:  Edema 2+ Pre tibial Skin:   Intact Neuro: Lethargic and Abnormal Motor 4/5 in BUE and BLE Musc/Skel:  Normal Gen:  NAD   Assessment/Plan: 1. Functional deficits secondary to L BKA which require 3+ hours per day of interdisciplinary therapy in a comprehensive inpatient rehab setting. Physiatrist is providing close team supervision and 24 hour management of active medical problems listed below. Physiatrist and rehab team continue to assess barriers to discharge/monitor patient progress toward functional and medical goals. FIM:                   Comprehension Comprehension Mode: Auditory Comprehension: 6-Follows complex conversation/direction: With extra time/assistive device  Expression Expression Mode:  Verbal Expression: 5-Expresses complex 90% of the time/cues < 10% of the time  Social Interaction Social Interaction: 5-Interacts appropriately 90% of the time - Needs monitoring or encouragement for participation or interaction.  Problem Solving Problem Solving: 5-Solves basic problems: With no assist  Memory Memory: 5-Recognizes or recalls 90% of the time/requires cueing < 10% of the time  Medical Problem List and Plan:  1. DVT Prophylaxis/Anticoagulation: Pharmaceutical: Coumadin  2. Pain Management: prn medications effective.  3. Mood: Has a good outlook overall. LCSW to follow up for formal evaluation.  4. Neuropsych: This patient is capable of making decisions on his/her own behalf.  5. DM type 2: monitor with AC/HS CBG checks. Continue metformin and amaryl daily. Use SSI for elevated BS.  6. Atrial Fibrillation: monitor HR with bid checks. Continue coumadin.  7. COPD: continue spirivia for symptom management.  8. HTN: Will monitor with bid checks. Will need to watch renal status with two diuretics on board. Continue HCTZ, lasix and lisinopril.  9. ABLA: add iron supplement.     LOS (Days) 1 A FACE TO FACE EVALUATION WAS PERFORMED  Sydney [name] E 06/25/2012, 10:09 AM

## 2012-06-25 NOTE — Progress Notes (Signed)
Occupational Therapy Note  Patient Details  Name: Sydney [name] MRN: [id number] Date of Birth: 08/19/1920 Today's Date: 06/25/2012  Time: 1300-1345 Pt denies pain Individual therapy  Pt seated in w/c upon arrival and agreeable to participating in therapy session.  Pt practiced sit<>stand with min A using RW; stand pivot transfer w/c <>bed with min A; lateral leans in bed; squat pivot transfers w/c<>bed with min A.  Pt required rest breaks between each transfer.  Pt stated hew wheelchair at home would not fit into bathroom.  Pt practiced each transfer X 2.   Lavone Neri [facility] 06/25/2012, 2:24 PM

## 2012-06-25 NOTE — Progress Notes (Signed)
Physical Therapy Session Note  Patient Details  Name: LANITA STAMMEN MRN: [id number] Date of Birth: 04-25-1921  Today's Date: 06/25/2012 Time: 1430-1505 Time Calculation (min): 35 min  Short Term Goals: Week 1:     Skilled Therapeutic Interventions/Progress Updates:  Instructed patient on WC mobility; patient propelled WC on controlled surface with close supervision 20' before requiring rest break.  Patient performed sit/stand transfer to/from [facility] and RW with min A and instruction for safe hand/foot placement.  Slide board transfer WC/mat table x2 with mod A.  Instructed patient in 10 reps each glut sets, left quad sets, side lying left hip extension, left LAQ.     Therapy Documentation Precautions:  Precautions Precautions: Fall Restrictions Weight Bearing Restrictions: Yes LLE Weight Bearing: Non weight bearing General:   Vital Signs:   Pain: Pain Assessment Pain Assessment: No/denies pain Mobility:   Locomotion :    Trunk/Postural Assessment :    Balance:   Exercises:   Other Treatments:    See FIM for current functional status  Therapy/Group: Individual Therapy  Rexene Agent 06/25/2012, 3:48 PM

## 2012-06-25 NOTE — Evaluation (Addendum)
Physical Therapy Assessment and Plan  Patient Details  Name: Sydney [name] MRN: [id number] Date of Birth: February 22, 1921  PT Diagnosis: Abnormality of gait, Difficulty walking, Impaired sensation, Muscle weakness and Pain in residual limb/phantom pain Rehab Potential: Good ELOS: 12-14 days   Today's Date: 06/25/2012 Time: 0730-0830 Time Calculation (min): 60 min  Problem List:  Patient Active Problem List  Diagnosis  . HYPOTHYROIDISM  . DIABETES MELLITUS, TYPE II  . HYPERLIPIDEMIA  . HYPERTENSION  . Atrial fibrillation  . COPD  . Chronic osteomyelitis of toe  . Osteomyelitis of toe  . Osteomyelitis of toe of left foot  . Unilateral complete BKA-left    Past Medical History:  Past Medical History  Diagnosis Date  . Lung disease     BRONCHOSPASTIC  . Neuropathy     takes Gabapentin bid  . Hypercholesterolemia     doesn't require meds for this  . Anemia   . COPD (chronic obstructive pulmonary disease)   . Blood transfusion 2012 ?APRIL  . Hypertension     takes Prinizide daily  . Chronic atrial fibrillation     takes Coumadin as instructed  . Peripheral vascular disease   . CHF (congestive heart failure)     takes Lasix every other day  . Shortness of breath     uses spiriva daily;with exertion  . Emphysema   . Pneumonia     hx of   . History of head injury     many yrs ago  . Arthritis     rheumatoid and takes Methotrexate weekly  . Bruises easily   . Open wound     with a foul odor-left big toe  . History of bladder infections   . Vaginal discharge     has seen GYN but says its nothing to worry about  . History of blood transfusion   . Diabetes mellitus     takes Metformin and Amaryl daily  . Hypothyroidism     takes Synthroid daily  . Macular degeneration     dry   Past Surgical History:  Past Surgical History  Procedure Date  . Total hip arthroplasty     LEFT HIP  . Back surgery   . Fracture surgery 2011    LEFT HIP FX /REPLACEMENT   . Joint  replacement     HIP LEFT   . Cholecystectomy 1951  . Dilation and curettage of uterus   . Abdominal hysterectomy 1975  . Amputation 11/09/2011    Procedure: AMPUTATION DIGIT;  Surgeon: Kathryne Hitch, MD;  Location: [facility] OR;  Service: Orthopedics;  Laterality: Right;  Right Great Toe Amputation  . Eye surgery     bilateral -cataract surgery  . Amputation 01/05/2012    Procedure: AMPUTATION RAY;  Surgeon: Kathryne Hitch, MD;  Location: [facility] ORS;  Service: Orthopedics;  Laterality: Left;  Amputation Left 4th and 5th Toes  . Cardioversion   . Amputation 05/31/2012    Procedure: AMPUTATION RAY;  Surgeon: Nadara Mustard, MD;  Location: [facility] OR;  Service: Orthopedics;  Laterality: Left;  Left foot 1st ray amp  . Amputation 06/21/2012    Procedure: AMPUTATION BELOW KNEE;  Surgeon: Nadara Mustard, MD;  Location: [facility] OR;  Service: Orthopedics;  Laterality: Left;  Left Below Knee Amputation    Assessment & Plan Clinical Impression: Sydney [name] is a 78 y.o. right-handed female with history of atrial fibrillation on chronic Coumadin therapy, COPD, diastolic congestive heart failure and peripheral vascular disease.  Patient was admitted 06/21/2012 with gangrenous left and chronic osteomyelitis. Recent left foot first ray amputation 05/31/2012 and discharge to home 06/01/2012. Noted progressive necrotic gangrenous changes with exposed bone foul-smelling odor. Patient underwent left below knee amputation 06/21/2012 per Dr. Lajoyce Corners. Post op chronic coumadin resumed. Has acute on chronic anemia with Hgb @ 8.3.  Patient transferred to CIR on 06/24/2012 .   Patient currently requires max with mobility secondary to muscle weakness and muscle joint tightness, impaired timing and sequencing and unbalanced muscle activation and decreased standing balance and decreased balance strategies. Pt also having difficulty with phantom pains and will benefit from pain management techniques.  Prior to hospitalization, patient  was modified independent with mobility and lived with Family;Alone in a House home.  Home access is 4Stairs to enter.  Patient will benefit from skilled PT intervention to maximize safe functional mobility, minimize fall risk and decrease caregiver burden for planned discharge home with 24 hour supervision.  Anticipate patient will benefit from follow up HH at discharge.  PT - End of Session Endurance Deficit: Yes PT Assessment Rehab Potential: Good Barriers to Discharge: None PT Plan PT Frequency: 2-3 X/day, 60-90 minutes Estimated Length of Stay: 12-14 days PT Treatment/Interventions: Ambulation/gait training;Balance/vestibular training;Disease management/prevention;Discharge planning;Community reintegration;DME/adaptive equipment instruction;Neuromuscular re-education;Pain management;Functional mobility training;Patient/family education;Psychosocial support;Skin care/wound management;UE/LE Strength taining/ROM;Therapeutic Exercise;Therapeutic Activities;Stair training;UE/LE Coordination activities;Wheelchair propulsion/positioning PT Recommendation Follow Up Recommendations: Home health PT (progressing to OPPT when gets prosthesis) Equipment Recommended: Other (comment) (to be determined)  PT Evaluation Precautions/Restrictions Precautions Precautions: Fall Restrictions Weight Bearing Restrictions: Yes LLE Weight Bearing: Non weight bearing  Vital Signs Oxygen Therapy SpO2: 98 % Pain Pain Assessment Pain Assessment: 0-10 Pain Score:   6 Pain Type: Acute pain;Surgical pain Pain Location: Leg Pain Orientation: Left Pain Descriptors: Discomfort Pain Onset: On-going Pain Intervention(s): Medication (See eMAR) Home Living/Prior Functioning Home Living Lives With: Family;Alone Available Help at Discharge: Family (daughter, retired. Able to be there full time) Type of Home: House Home Access: Stairs to enter [employer] of Steps: 4 Entrance Stairs-Rails:  Right;Left;Can reach both Home Layout: One level Bathroom Shower/Tub: Walk-in Contractor: Handicapped height Bathroom Accessibility: Yes How Accessible: Accessible via walker (not a wheelchair) Home Adaptive Equipment: Walker - rolling;Wheelchair - manual;Other (comment);Grab bars in shower;Built-in shower seat (transport chsir) Additional Comments: Was using a cane PTA,  Prior Function Level of Independence: Independent with basic ADLs;Independent with gait Able to Take Stairs?: Yes Driving: Yes (was until placed in surgical shoe in January) Vocation: Retired Leisure: Hobbies-yes (Comment) Comments: 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  Vision/Perception  Vision - History Baseline Vision: Wears glasses only for reading Visual History: Macular degeneration Perception Perception: Within Functional Limits Praxis Praxis: Intact  Cognition Overall Cognitive Status: Appears within functional limits for tasks assessed Arousal/Alertness: Awake/alert Orientation Level: Oriented X4 Memory: Appears intact Awareness: Appears intact Problem Solving: Appears intact Safety/Judgment: Appears intact Sensation Sensation Light Touch: Impaired Detail Light Touch Impaired Details: Impaired RLE (diabetic neuropathy) Additional Comments: patient with complaints of numbness and tingling; swelling in fingers secondary to RA per patient report Coordination Gross Motor Movements are Fluid and Coordinated: Yes Fine Motor Movements are Fluid and Coordinated: Yes  Mobility Bed Mobility Rolling Right: 5: Supervision Right Sidelying to Sit: 4: Min assist Right Sidelying to Sit Details (indicate cue type and reason): Cues for efficiency, pt needs multiple attempts Transfers Sit to Stand: 2: Max assist;3: Mod  assist Sit to Stand Details (indicate cue type and reason): max assist progressing to  moderate assist. Difficulty transitioning hands from wheelchair to RW. Stand to Sit: 3: Mod assist Stand Pivot Transfers: 3: Mod assist Locomotion  Ambulation Ambulation/Gait Assistance: 3: Mod assist Ambulation Distance (Feet): 8 Feet Assistive device: Rolling walker Ambulation/Gait Assistance Details: Verbal cues for sequencing;Verbal cues for technique;Verbal cues for precautions/safety;Verbal cues for safe use of DME/AE Ambulation/Gait Assistance Details: Max verbal cues for sequencing, needed assist to progress RW apporpriately.  Gait Gait Pattern: Step-to pattern;Lateral trunk lean to right;Trunk flexed;Right flexed knee in stance Stairs / Additional Locomotion Stairs: No (unsafe to perform) Naval architect Mobility: Yes Wheelchair Assistance: 4: Administrator, sports Details: Verbal cues for safe use of DME/AE;Verbal cues for sequencing;Verbal cues for Diplomatic Services operational officer: Both upper extremities Wheelchair Parts Management: Needs assistance Distance: 30'   Higher education careers adviser Sitting Balance Static Sitting - Balance Support: No upper extremity supported Static Sitting - Level of Assistance: 5: Stand by assistance Static Standing Balance Static Standing - Balance Support: Bilateral upper extremity supported;During functional activity Static Standing - Level of Assistance: 4: Min assist Extremity Assessment  RUE Assessment RUE Assessment: Within Functional Limits (pain and swelling due to neuropathy and RA) LUE Assessment LUE Assessment: Within Functional Limits (pain and swelling due to neuropathy and RA) RLE AROM (degrees) RLE Overall AROM Comments: WFL RLE Strength RLE Overall Strength Comments: Generalized deconditioning, gorssly >/= 3+/5 LLE AROM (degrees) LLE Overall AROM Comments: Decreased Lt. knee extension, limited by ~20 degrees. Hip ROM not assessed. LLE Strength LLE Overall Strength Comments: Decreased knee extension and  flexion, limited by pain not accurately tested secondary to pain.   Skilled Therapeutic Interventions/Progress Updates:  Discussed positioning in wheelchair and in bed to decrease risk for knee contracture, discussed methods for pain management however pt will benefit from practice of these. Discussed risk of diabetic ulcer to Rt. LE, need to have nursing or family perform skin checks daily and need for appropriate shoes to decrease risk for Rt. LE amputation. Practiced transfers wheelchair <> bed performed with mod assist.   See FIM for current functional status Refer to Care Plan for Long Term Goals  Recommendations for other services: None  Discharge Criteria: Patient will be discharged from PT if patient refuses treatment 3 consecutive times without medical reason, if treatment goals not met, if there is a change in medical status, if patient makes no progress towards goals or if patient is discharged from hospital.  The above assessment, treatment plan, treatment alternatives and goals were discussed and mutually agreed upon: by patient  Wilhemina Bonito 06/25/2012, 10:29 AM

## 2012-06-25 NOTE — Progress Notes (Signed)
Inpatient Diabetes Program Recommendations  AACE/ADA: New Consensus Statement on Inpatient Glycemic Control (2013)  Target Ranges:  Prepandial:   less than 140 mg/dL      Peak postprandial:   less than 180 mg/dL (1-2 hours)      Critically ill patients:  140 - 180 mg/dL     Inpatient Diabetes Program Recommendations HgbA1C: Please consider ordering an A1C since last A1C in chart was 7.8% on 07/22/2007.  Note: Will continue to follow.  Thanks, Orlando Penner, RN, BSN, CCRN Diabetes Coordinator Inpatient Diabetes Program [phone]

## 2012-06-25 NOTE — Evaluation (Signed)
Occupational Therapy Assessment and Plan & Session Note  Patient Details  Name: COLUMBIA PANDEY MRN: [id number] Date of Birth: [birth year]  OT Diagnosis: acute pain and muscle weakness (generalized) Rehab Potential: Rehab Potential: Good ELOS: 12-14 days   Today's Date: 06/25/2012  Problem List:  Patient Active Problem List  Diagnosis  . HYPOTHYROIDISM  . DIABETES MELLITUS, TYPE II  . HYPERLIPIDEMIA  . HYPERTENSION  . Atrial fibrillation  . COPD  . Chronic osteomyelitis of toe  . Osteomyelitis of toe  . Osteomyelitis of toe of left foot  . Unilateral complete BKA-left    Past Medical History:  Past Medical History  Diagnosis Date  . Lung disease     BRONCHOSPASTIC  . Neuropathy     takes Gabapentin bid  . Hypercholesterolemia     doesn't require meds for this  . Anemia   . COPD (chronic obstructive pulmonary disease)   . Blood transfusion 2012 ?APRIL  . Hypertension     takes Prinizide daily  . Chronic atrial fibrillation     takes Coumadin as instructed  . Peripheral vascular disease   . CHF (congestive heart failure)     takes Lasix every other day  . Shortness of breath     uses spiriva daily;with exertion  . Emphysema   . Pneumonia     hx of   . History of head injury     many yrs ago  . Arthritis     rheumatoid and takes Methotrexate weekly  . Bruises easily   . Open wound     with a foul odor-left big toe  . History of bladder infections   . Vaginal discharge     has seen GYN but says its nothing to worry about  . History of blood transfusion   . Diabetes mellitus     takes Metformin and Amaryl daily  . Hypothyroidism     takes Synthroid daily  . Macular degeneration     dry   Past Surgical History:  Past Surgical History  Procedure Date  . Total hip arthroplasty     LEFT HIP  . Back surgery   . Fracture surgery 2011    LEFT HIP FX /REPLACEMENT   . Joint replacement     HIP LEFT   . Cholecystectomy 1951  . Dilation and curettage of  uterus   . Abdominal hysterectomy 1975  . Amputation 11/09/2011    Procedure: AMPUTATION DIGIT;  Surgeon: Kathryne Hitch, MD;  Location: [facility] OR;  Service: Orthopedics;  Laterality: Right;  Right Great Toe Amputation  . Eye surgery     bilateral -cataract surgery  . Amputation 01/05/2012    Procedure: AMPUTATION RAY;  Surgeon: Kathryne Hitch, MD;  Location: [facility] ORS;  Service: Orthopedics;  Laterality: Left;  Amputation Left 4th and 5th Toes  . Cardioversion   . Amputation 05/31/2012    Procedure: AMPUTATION RAY;  Surgeon: Nadara Mustard, MD;  Location: [facility] OR;  Service: Orthopedics;  Laterality: Left;  Left foot 1st ray amp  . Amputation 06/21/2012    Procedure: AMPUTATION BELOW KNEE;  Surgeon: Nadara Mustard, MD;  Location: [facility] OR;  Service: Orthopedics;  Laterality: Left;  Left Below Knee Amputation   Clinical Impression:Meghana C Turan is a 78 y.o. right-handed female with history of atrial fibrillation on chronic Coumadin therapy, COPD, diastolic congestive heart failure and peripheral vascular disease. Patient was admitted 06/21/2012 with gangrenous left and chronic osteomyelitis. Recent left foot first ray amputation  05/31/2012 and discharge to home 06/01/2012. Noted progressive necrotic gangrenous changes with exposed bone foul-smelling odor. Patient underwent left below knee amputation 06/21/2012 per Dr. Lajoyce Corners. Post op chronic coumadin resumed. Has acute on chronic anemia with Hgb @ 8.3. Patient transferred to CIR on 06/24/2012 .    Patient currently requires min-total assist (min assist for UB ADLs, min assist for toilet transfer, mod assist for sit->stand, total assist for LB ADLs) secondary to muscle weakness and muscle joint tightness and decreased sitting balance, decreased standing balance, decreased postural control and decreased balance strategies.  Prior to hospitalization, patient could complete ADLs and IADLs independently.   Patient will benefit from skilled intervention to  increase independence with basic self-care skills prior to discharge home with daughter, [name].  Anticipate patient will require 24 hour supervision and follow up home health.  OT - End of Session Activity Tolerance: Tolerates 10 - 20 min activity with multiple rests Endurance Deficit: Yes OT Assessment Rehab Potential: Good Barriers to Discharge: None (none known at this time) OT Plan OT Frequency: 1-2 X/day, 60-90 minutes Estimated Length of Stay: 12-14 days OT Treatment/Interventions: Balance/vestibular training;Community reintegration;Discharge planning;DME/adaptive equipment instruction;Functional mobility training;Disease mangement/prevention;Neuromuscular re-education;Pain management;Patient/family education;Psychosocial support;Self Care/advanced ADL retraining;Skin care/wound managment;Splinting/orthotics;Therapeutic Activities;Therapeutic Exercise;UE/LE Strength taining/ROM;UE/LE Coordination activities;Wheelchair propulsion/positioning OT Recommendation Follow Up Recommendations: Home health OT Equipment Recommended: Other (comment) (to be determined)  Precautions/Restrictions  Precautions Precautions: Fall Restrictions Weight Bearing Restrictions: Yes LLE Weight Bearing: Non weight bearing  General Chart Reviewed: Yes Family/Caregiver Present: Yes (daughter, [name])  Pain Pain Assessment Pain Assessment: 0-10 Pain Score:   6 Pain Type: Acute pain;Surgical pain Pain Location: Leg Pain Orientation: Left Pain Descriptors: Discomfort Pain Onset: On-going Pain Intervention(s): Medication (See eMAR)  Home Living/Prior Functioning Home Living Lives With: Family;Alone Available Help at Discharge: Family (daughter, retired. Able to be there full time) Type of Home: House Home Access: Stairs to enter [employer] of Steps: 4 Entrance Stairs-Rails: Right;Left;Can reach both Home Layout: One level Bathroom Shower/Tub: Walk-in Contractor:  Handicapped height Bathroom Accessibility: Yes How Accessible: Accessible via walker (not a wheelchair) Home Adaptive Equipment: Walker - rolling;Wheelchair - manual;Other (comment);Grab bars in shower;Built-in shower seat (transport chsir) Additional Comments: Was using a cane PTA,  IADL History Homemaking Responsibilities: Yes Homemaking Comments: Patient loves to bake but secondary to disability, daughter [name] has been assisting with IADL tasks Prior Function Level of Independence: Independent with basic ADLs;Independent with gait Able to Take Stairs?: Yes Driving: Yes (was until placed in surgical shoe in January) Vocation: Retired Leisure: Hobbies-yes (Comment) Comments: 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   ADL - See FIM  Vision/Perception  Vision - History Baseline Vision: Wears glasses only for reading Visual History: Macular degeneration Perception Perception: Within Functional Limits Praxis Praxis: Intact   Cognition Overall Cognitive Status: Appears within functional limits for tasks assessed Arousal/Alertness: Awake/alert Orientation Level: Oriented X4 Memory: Appears intact Awareness: Appears intact Problem Solving: Appears intact Safety/Judgment: Appears intact  Sensation Sensation Light Touch: Impaired Detail Light Touch Impaired Details: Impaired RLE (diabetic neuropathy) Additional Comments: patient with complaints of numbness and tingling; swelling in fingers secondary to RA per patient report Coordination Gross Motor Movements are Fluid and Coordinated: Yes Fine Motor Movements are Fluid and Coordinated: Yes  Mobility  Bed Mobility Rolling Right: 5: Supervision Right Sidelying to Sit: 4: Min assist Right Sidelying to Sit Details (indicate cue type and reason): Cues for efficiency, pt  needs multiple attempts Transfers Sit to Stand: 2: Max assist;3: Mod assist Sit to Stand Details  (indicate cue type and reason): max assist progressing to moderate assist. Difficulty transitioning hands from wheelchair to RW. Stand to Sit: 3: Mod assist   Balance Static Sitting Balance Static Sitting - Balance Support: No upper extremity supported Static Sitting - Level of Assistance: 5: Stand by assistance Static Standing Balance Static Standing - Balance Support: Bilateral upper extremity supported;During functional activity Static Standing - Level of Assistance: 4: Min assist  Extremity/Trunk Assessment RUE Assessment RUE Assessment: Within Functional Limits (pain and swelling due to neuropathy and RA) LUE Assessment LUE Assessment: Within Functional Limits (pain and swelling due to neuropathy and RA) -Patient will benefit from UE strengthening exercises.   See FIM for current functional status  Refer to Care Plan for Long Term Goals  Recommendations for other services: None  Discharge Criteria: Patient will be discharged from OT if patient refuses treatment 3 consecutive times without medical reason, if treatment goals not met, if there is a change in medical status, if patient makes no progress towards goals or if patient is discharged from hospital.  The above assessment, treatment plan, treatment alternatives and goals were discussed and mutually agreed upon: by patient  ------------------------------------------------------------------------------------------------  SESSION NOTE  0930-1030 - 60 Minutes Individual Therapy No complaints of pain Initial 1:1 occupational therapy evaluation completed. Focused skilled intervention on grooming tasks seated at sink, toilet transfer on/off BSC seated over elevated toilet seat (patient required min assist <->), toileting tasks (clothing management & peri hygiene) UB/LB bathing & dressing, sit/stands, dynamic standing balance/tolerance/endurance, and overall activity tolerance/endurance. At end of session left patient seated in  w/c beside bed with call bell & phone left within reach. Patient's daughter, [name], was present during entire session.   Syre Knerr 06/25/2012, 10:51 AM

## 2012-06-26 ENCOUNTER — Inpatient Hospital Stay (HOSPITAL_COMMUNITY): Payer: Medicare Other

## 2012-06-26 ENCOUNTER — Inpatient Hospital Stay (HOSPITAL_COMMUNITY): Payer: Medicare Other | Admitting: Occupational Therapy

## 2012-06-26 ENCOUNTER — Inpatient Hospital Stay (HOSPITAL_COMMUNITY): Payer: Medicare Other | Admitting: Physical Therapy

## 2012-06-26 LAB — PROTIME-INR
INR: 2.19 — ABNORMAL HIGH (ref 0.00–1.49)
Prothrombin Time: 23.4 seconds — ABNORMAL HIGH (ref 11.6–15.2)

## 2012-06-26 LAB — GLUCOSE, CAPILLARY
Glucose-Capillary: 147 mg/dL — ABNORMAL HIGH (ref 70–99)
Glucose-Capillary: 114 mg/dL — ABNORMAL HIGH (ref 70–99)

## 2012-06-26 MED ORDER — WARFARIN SODIUM 2.5 MG PO TABS
2.5000 mg | ORAL_TABLET | ORAL | Status: DC
  Administered 2012-06-26 – 2012-06-30 (×5): 2.5 mg via ORAL
  Filled 2012-06-26 (×6): qty 1

## 2012-06-26 MED ORDER — WARFARIN SODIUM 5 MG PO TABS: 5.0000 mg | ORAL_TABLET | ORAL | Status: DC

## 2012-06-26 MED ORDER — HYDROCERIN EX CREA
TOPICAL_CREAM | Freq: Two times a day (BID) | CUTANEOUS | Status: DC
  Administered 2012-06-26 – 2012-06-28 (×5): via TOPICAL
  Administered 2012-06-28: 1 via TOPICAL
  Administered 2012-06-29 – 2012-07-02 (×7): via TOPICAL
  Administered 2012-07-02: 1 via TOPICAL
  Administered 2012-07-03 – 2012-07-05 (×5): via TOPICAL
  Filled 2012-06-26: qty 113

## 2012-06-26 NOTE — Progress Notes (Signed)
Physical Therapy Session Note  Patient Details  Name: Sydney [name] MRN: [id number] Date of Birth: Oct 21, 1920  Today's Date: 06/26/2012 Time: 4098-1191 Time Calculation (min): 30 min  Short Term Goals: Week 1:  PT Short Term Goal 1 (Week 1): Pt will perform sit <> stand from variety of surfaces with min assist, min verbal cues PT Short Term Goal 2 (Week 1): Pt will perform stand pivot transfers with min assist PT Short Term Goal 3 (Week 1): Pt will propel wheelchair x 100' over controlled environment with supervision PT Short Term Goal 4 (Week 1): Pt will ambulate x 15' with RW, min assist PT Short Term Goal 5 (Week 1): Pt will report complience with performing HEP 75% of days outside of scheduled therapy time  Skilled Therapeutic Interventions/Progress Updates:    Treatment focused on w/c mobility, sit-stand and gait training in parallel bars.  Pt min-mod assist with sit-stand in parallel bars pushing up from w/c.  Tends to lean posteriorly and needs cues to find center of balance.   Therapy Documentation Precautions:  Precautions Precautions: Fall Restrictions Weight Bearing Restrictions: Yes LLE Weight Bearing: Non weight bearing  Pain:  Pt denies pain at present     Locomotion : Ambulation Ambulation/Gait Assistance: 3: Mod assist in parallel bars x 8 ft x 2 with seated rest break.  Wheelchair Mobility Distance: 100 with min assist in hospital environment   See FIM for current functional status  Therapy/Group: Individual Therapy  Newell Coral 06/26/2012, 12:28 PM

## 2012-06-26 NOTE — Patient Care Conference (Signed)
Inpatient RehabilitationTeam Conference Note Date: 06/25/2012   Time: 2:23 PM    Patient Name: Sydney [name]      Medical Record Number: [id number]  Date of Birth: January 14, 1921 Sex: Female         Room/Bed: 4038/4038-01 Payor Info: Payor: MEDICARE  Plan: MEDICARE PART A AND B  Product Type: *No Product type*     Admitting Diagnosis: LT BKA  Admit Date/Time:  06/24/2012  3:20 PM Admission Comments: No comment available   Primary Diagnosis:  Unilateral complete BKA Principal Problem: Unilateral complete BKA  Patient Active Problem List   Diagnosis Date Noted  . Unilateral complete BKA-left 06/24/2012  . Osteomyelitis of toe 01/05/2012  . Osteomyelitis of toe of left foot 01/05/2012  . Chronic osteomyelitis of toe 11/09/2011  . HYPOTHYROIDISM 06/11/2007  . DIABETES MELLITUS, TYPE II 06/11/2007  . HYPERLIPIDEMIA 06/11/2007  . HYPERTENSION 06/11/2007  . Atrial fibrillation 06/11/2007  . COPD 06/11/2007    Expected Discharge Date: Expected Discharge Date: 07/05/12  Team Members Present: Physician: Dr. Claudette Laws Social Worker Present: Amada Jupiter, LCSW Nurse Present: Other (comment) Berta Minor, RN) PT Present: Karolee Stamps, PT;Other (comment) Sherrine Maples, PT) OT Present: Edwin Cap, Loistine Chance, OT Other (Discipline and Name): Bayard Hugger, RN     Current Status/Progress Goal Weekly Team Focus  Medical   Cont B and B, some balance issues, good movement in L hip  Improved standing balance  Improve endurance   Bowel/Bladder   mod assist; pt. is continent of bowel and bladder  pt. will remain continent of bowel and bladder  assess bowel and bladder Q shift    Swallow/Nutrition/ Hydration             ADL's   min-total assist (min assist for UB ADLs, min assist for ADL transfers, total assist for LB ADLs, mod assist sit->stand)  overall supervision except min assist for shower transfers  ADL retraining, sit->stands, lateral leans, overall activity  tolerance/endurance, IADL tasks   Mobility   mod/max assist for sit <> stands to RW, mod assist for scoot transfers to wheelchair. Mod assist for  short distance ambulation.   supervision for transfers and gait.   Improve endurance, transfers, standing balance and tolerance, increase gait.    Communication             Safety/Cognition/ Behavioral Observations  no safety issues; min assist  pt. will be free from fall upon discharge  asses q shift   Pain   NA  NA  NA   Skin   surgical dressing in place; no skin breakdown  pt. will be free of skin breakdown upon discharge  assess skin q shift    Rehab Goals Patient on target to meet rehab goals: Yes *See Interdisciplinary Assessment and Plan and progress notes for long and short-term goals  Barriers to Discharge: No caregiver identified    Possible Resolutions to Barriers:  SW to assist    Discharge Planning/Teaching Needs:  d/c to her own home with daughter to stay there and provide 2/7 assist      Team Discussion:  Making excellent gains for this new patient and anticipate good progress.  No concerns at this time.  Anticipate pt to reach supervision w/c level goals  Revisions to Treatment Plan:  none   Continued Need for Acute Rehabilitation Level of Care: The patient requires daily medical management by a physician with specialized training in physical medicine and rehabilitation for the following conditions: Daily direction of  a multidisciplinary physical rehabilitation program to ensure safe treatment while eliciting the highest outcome that is of practical value to the patient.: Yes Daily medical management of patient stability for increased activity during participation in an intensive rehabilitation regime.: Yes Daily analysis of laboratory values and/or radiology reports with any subsequent need for medication adjustment of medical intervention for : Post surgical problems  [name] 06/26/2012, 2:23 PM

## 2012-06-26 NOTE — Progress Notes (Signed)
Social Work Patient ID: Sydney [name], female   DOB: 03-26-1921, 78 y.o.   MRN: [id number]  Met with patient and daughter today to review team conference.  Both aware and agreeable with targeted d/c date 11/22 at supervision level overall (w/c).  Both very pleased with gains so far and deny any concerns about being able to manage at home at d/c .  Will continue to follow.  [name]

## 2012-06-26 NOTE — Progress Notes (Signed)
Patient ID: Sydney [name], female   DOB: Oct 02, 1920, 78 y.o.   MRN: [id number] Sydney [name] is a 78 y.o. right-handed female with history of atrial fibrillation on chronic Coumadin therapy, COPD, diastolic congestive heart failure and peripheral vascular disease. Patient was admitted 06/21/2012 with gangrenous left and chronic osteomyelitis. Recent left foot first ray amputation 05/31/2012 and discharge to home 06/01/2012. Noted progressive necrotic gangrenous changes with exposed bone foul-smelling odor. Patient underwent left below knee amputation 06/21/2012 per Dr. Lajoyce Corners. Post op chronic coumadin resumed.  Subjective/Complaints: Was up all day yesterday--"worn out" Worried about situation in [place] Had BM this morning.   Objective: Vital Signs: Blood pressure 120/64, pulse 53, temperature 98.2 F (36.8 C), temperature source Oral, resp. rate 19, SpO2 98.00%. No results found. Results for orders placed during the hospital encounter of 06/24/12 (from the past 72 hour(s))  GLUCOSE, CAPILLARY     Status: Abnormal   Collection Time   06/24/12  5:41 PM      Component Value Range Comment   Glucose-Capillary 68 (*) 70 - 99 mg/dL   GLUCOSE, CAPILLARY     Status: Abnormal   Collection Time   06/24/12  6:45 PM      Component Value Range Comment   Glucose-Capillary 150 (*) 70 - 99 mg/dL    Comment 1 Notify RN     GLUCOSE, CAPILLARY     Status: Abnormal   Collection Time   06/24/12  8:23 PM      Component Value Range Comment   Glucose-Capillary 163 (*) 70 - 99 mg/dL    Comment 1 Notify RN     GLUCOSE, CAPILLARY     Status: Abnormal   Collection Time   06/25/12  6:45 AM      Component Value Range Comment   Glucose-Capillary 108 (*) 70 - 99 mg/dL   CBC WITH DIFFERENTIAL     Status: Abnormal   Collection Time   06/25/12  6:59 AM      Component Value Range Comment   WBC 5.0  4.0 - 10.5 K/uL    RBC 2.78 (*) 3.87 - 5.11 MIL/uL    Hemoglobin 7.9 (*) 12.0 - 15.0 g/dL    HCT 78.2 (*) 95.6  - 46.0 %    MCV 89.6  78.0 - 100.0 fL    MCH 28.4  26.0 - 34.0 pg    MCHC 31.7  30.0 - 36.0 g/dL    RDW 21.3 (*) 08.6 - 15.5 %    Platelets 237  150 - 400 K/uL    Neutrophils Relative 64  43 - 77 %    Neutro Abs 3.2  1.7 - 7.7 K/uL    Lymphocytes Relative 21  12 - 46 %    Lymphs Abs 1.1  0.7 - 4.0 K/uL    Monocytes Relative 9  3 - 12 %    Monocytes Absolute 0.4  0.1 - 1.0 K/uL    Eosinophils Relative 5  0 - 5 %    Eosinophils Absolute 0.3  0.0 - 0.7 K/uL    Basophils Relative 0  0 - 1 %    Basophils Absolute 0.0  0.0 - 0.1 K/uL   COMPREHENSIVE METABOLIC PANEL     Status: Abnormal   Collection Time   06/25/12  6:59 AM      Component Value Range Comment   Sodium 137  135 - 145 mEq/L    Potassium 3.7  3.5 - 5.1 mEq/L    Chloride  99  96 - 112 mEq/L    CO2 27  19 - 32 mEq/L    Glucose, Bld 112 (*) 70 - 99 mg/dL    BUN 19  6 - 23 mg/dL    Creatinine, Ser 4.09  0.50 - 1.10 mg/dL    Calcium 8.6  8.4 - 81.1 mg/dL    Total Protein 6.1  6.0 - 8.3 g/dL    Albumin 2.8 (*) 3.5 - 5.2 g/dL    AST 17  0 - 37 U/L    ALT <5  0 - 35 U/L    Alkaline Phosphatase 63  39 - 117 U/L    Total Bilirubin 0.3  0.3 - 1.2 mg/dL    GFR calc non Af Amer 57 (*) >90 mL/min    GFR calc Af Amer 66 (*) >90 mL/min   PROTIME-INR     Status: Abnormal   Collection Time   06/25/12 10:28 AM      Component Value Range Comment   Prothrombin Time 23.5 (*) 11.6 - 15.2 seconds    INR 2.20 (*) 0.00 - 1.49   GLUCOSE, CAPILLARY     Status: Abnormal   Collection Time   06/25/12 11:21 AM      Component Value Range Comment   Glucose-Capillary 177 (*) 70 - 99 mg/dL   GLUCOSE, CAPILLARY     Status: Abnormal   Collection Time   06/25/12  4:46 PM      Component Value Range Comment   Glucose-Capillary 158 (*) 70 - 99 mg/dL   GLUCOSE, CAPILLARY     Status: Abnormal   Collection Time   06/25/12  9:20 PM      Component Value Range Comment   Glucose-Capillary 183 (*) 70 - 99 mg/dL   PROTIME-INR     Status: Abnormal    Collection Time   06/26/12  6:05 AM      Component Value Range Comment   Prothrombin Time 23.4 (*) 11.6 - 15.2 seconds    INR 2.19 (*) 0.00 - 1.49   GLUCOSE, CAPILLARY     Status: Abnormal   Collection Time   06/26/12  7:52 AM      Component Value Range Comment   Glucose-Capillary 118 (*) 70 - 99 mg/dL    Comment 1 Documented in Chart      Comment 2 Notify RN        HEENT: normal Cardio: RRR and IRIR, posititive murmur Resp: CTA B/L GI: BS positive Extremity:  No Edema Skin:   Intact and Other Dry skin on RLE.  No breakdown Neuro: Alert/Oriented and Normal Sensory Musc/Skel:  Normal and Other L-BKA with compressive dressing.  Gen:  NAD   Assessment/Plan: 1. Functional deficits secondary to L BKA which require 3+ hours per day of interdisciplinary therapy in a comprehensive inpatient rehab setting. Physiatrist is providing close team supervision and 24 hour management of active medical problems listed below. Physiatrist and rehab team continue to assess barriers to discharge/monitor patient progress toward functional and medical goals. FIM: FIM - Bathing Bathing Steps Patient Completed: Chest;Right Arm;Left Arm;Abdomen;Right upper leg;Left upper leg Bathing: 3: Mod-Patient completes 5-7 73f 10 parts or 50-74%  FIM - Upper Body Dressing/Undressing Upper body dressing/undressing steps patient completed: Thread/unthread right sleeve of pullover shirt/dresss;Thread/unthread left sleeve of pullover shirt/dress;Put head through opening of pull over shirt/dress Upper body dressing/undressing: 4: Min-Patient completed 75 plus % of tasks FIM - Lower Body Dressing/Undressing Lower body dressing/undressing: 1: Total-Patient completed less than 25%  of tasks  FIM - Toileting Toileting: 1: Total-Patient completed zero steps, helper did all 3  FIM - Diplomatic Services operational officer Devices: Bedside commode;Elevated toilet seat;Grab bars Toilet Transfers: 4-To toilet/BSC: Min A  (steadying Pt. > 75%);4-From toilet/BSC: Min A (steadying Pt. > 75%)  FIM - Bed/Chair Transfer Bed/Chair Transfer: 4: Supine > Sit: Min A (steadying Pt. > 75%/lift 1 leg);4: Sit > Supine: Min A (steadying pt. > 75%/lift 1 leg);3: Bed > Chair or W/C: Mod A (lift or lower assist);3: Chair or W/C > Bed: Mod A (lift or lower assist)  FIM - Locomotion: Wheelchair Distance: 30' Locomotion: Wheelchair: 1: Travels less than 50 ft with minimal assistance (Pt.>75%) FIM - Locomotion: Ambulation Locomotion: Ambulation Assistive Devices: Designer, industrial/product Ambulation/Gait Assistance: 3: Mod assist Locomotion: Ambulation: 1: Travels less than 50 ft with moderate assistance (Pt: 50 - 74%)  Comprehension Comprehension Mode: Auditory Comprehension: 6-Follows complex conversation/direction: With extra time/assistive device  Expression Expression Mode: Verbal Expression: 6-Expresses complex ideas: With extra time/assistive device  Social Interaction Social Interaction: 6-Interacts appropriately with others with medication or extra time (anti-anxiety, antidepressant).  Problem Solving Problem Solving: 5-Solves complex 90% of the time/cues < 10% of the time  Memory Memory: 6-More than reasonable amt of time  Medical Problem List and Plan:  1. DVT Prophylaxis/Anticoagulation: Pharmaceutical: Coumadin  2. Pain Management: prn medications effective.  3. Mood: Has a good outlook overall. LCSW to follow up for formal evaluation.  4. Neuropsych: This patient is capable of making decisions on his/her own behalf.  5. DM type 2: monitor with AC/HS CBG checks. Continue metformin and amaryl daily. Use SSI for elevated BS. No hypoglycemia in last 24 hours. 6. Atrial Fibrillation: Monitor HR with bid checks. Needs to have HR checked apically. Continue coumadin.  7. COPD: continue spirivia for symptom management  8. HTN: Will monitor with bid checks. Will need to watch renal status with two diuretics on board.  Continue HCTZ, lasix and lisinopril.  Labs on 11/12 without signs of renal insufficiency.  9. ABLA:  Iron supplement added.  Recheck in am.     LOS (Days) 2 A FACE TO FACE EVALUATION WAS PERFORMED  Jacquelynn Cree 06/26/2012, 8:56 AM

## 2012-06-26 NOTE — Progress Notes (Signed)
Occupational Therapy Session Notes  Patient Details  Name: Sydney [name] MRN: [id number] Date of Birth: [birth year]  Today's Date: 06/26/2012  Short Term Goals: Week 1:  OT Short Term Goal 1 (Week 1): Patient will bathe 9/9 body parts with min/steady assist OT Short Term Goal 2 (Week 1): Education and practice regarding lateral leans will be addressed to help increase independence with BADLs OT Short Term Goal 3 (Week 1): Patient will perform toileting tasks with moderate assistance (2/3 steps) OT Short Term Goal 4 (Week 1): Patient willl perform sit-> stands with min assist to help increase independence with BADLs  Skilled Therapeutic Interventions/Progress Updates:   Session #1 0915 - 1000 - 45 Minutes Individual Therapy No complaints of pain Patient found supine in bed. Engaged in bed mobility in order to sit edge of bed -> w/c squat pivot transfer with minimal assistance. Patient then sat at sink in w/c to complete ADL retraining. Focused skilled intervention on UB/LB bathing & dressing, sit/stands, overall activity tolerance/endurance, management of BLEs, dynamic standing balance/tolerance/endurance, and grooming tasks seated at sink. At end of session left patient seated in w/c beside bed with call bell & phone within reach. Patient's daughter, [name], was present during entire session.   Session #2 4098-1191 - 55 Minutes Individual Therapy No complaints of pain Upon entering room patient seated in w/c and stated she needed to use restroom. Assisted patient -> elevated toilet seat with min assist <-> seat using grab bars. Patient required max assist for toileting task (patient able to perform peri care, but needed assist with clothing management). Patient then propelled self -> therapy gym and focused on w/c management, w/c -> mat squat pivot transfer, UE strengthening using pushup blocks, and education regarding inspection mirror(verbal education & demonstration). At end of session  therapist propelled patient back to room for next therapy session.   Precautions:  Precautions Precautions: Fall Restrictions Weight Bearing Restrictions: Yes LLE Weight Bearing: Non weight bearing  See FIM for current functional status  Keyarah Mcroy 06/26/2012, 7:30 AM

## 2012-06-26 NOTE — Progress Notes (Signed)
Patient information reviewed and entered into eRehab system by Malajah Oceguera, RN, CRRN, PPS Coordinator.  Information including medical coding and functional independence measure will be reviewed and updated through discharge.     Per nursing patient was given "Data Collection Information Summary for Patients in Inpatient Rehabilitation Facilities with attached "Privacy Act Statement-Health Care Records" upon admission.  

## 2012-06-26 NOTE — Progress Notes (Signed)
Physical Therapy Session Note  Patient Details  Name: Sydney [name] MRN: [id number] Date of Birth: 1920/08/27  Today's Date: 06/26/2012 Time: 1030-1100 Time Calculation (min): 30 min  Short Term Goals: Week 1:  PT Short Term Goal 1 (Week 1): Pt will perform sit <> stand from variety of surfaces with min assist, min verbal cues PT Short Term Goal 2 (Week 1): Pt will perform stand pivot transfers with min assist PT Short Term Goal 3 (Week 1): Pt will propel wheelchair x 100' over controlled environment with supervision PT Short Term Goal 4 (Week 1): Pt will ambulate x 15' with RW, min assist PT Short Term Goal 5 (Week 1): Pt will report complience with performing HEP 75% of days outside of scheduled therapy time  Skilled Therapeutic Interventions/Progress Updates:    Pt had just completed gait and standing activities with prior PT and was fatigued. Focused on supine therex for residual limb (including quad sets, hip flexion (SLR), modified bridging, and SAQ x 10 reps each), si to stands and stand pivot transfers. Pt requires mod A for RW negotiation during pivot transfer; will need to work on future sessions.  Therapy Documentation Precautions:  Precautions Precautions: Fall Restrictions Weight Bearing Restrictions: Yes LLE Weight Bearing: Non weight bearing Pain:  Denies pain.  See FIM for current functional status  Therapy/Group: Individual Therapy  Karolee Stamps [facility] 06/26/2012, 12:18 PM

## 2012-06-26 NOTE — Progress Notes (Signed)
ANTICOAGULATION CONSULT NOTE - Follow up  Consult  Pharmacy Consult for coumadin Indication: VTE prophylaxis, h/o afib  Allergies  Allergen Reactions  . Other     Narcotics=makes her very disoriented  . Percocet (Oxycodone-Acetaminophen) Other (See Comments)    Dilerium, hallucinations  . Tramadol Anxiety  . Vicodin (Hydrocodone-Acetaminophen) Anxiety    Patient Measurements: Wt= 59.8kg  Vital Signs: Temp: 98.2 F (36.8 C) (11/13 0500) Temp src: Oral (11/13 0500) BP: 120/64 mmHg (11/13 0500) Pulse Rate: 53  (11/13 0500)  Labs:  Basename 06/26/12 0605 06/25/12 1028 06/25/12 0659 06/24/12 0610  HGB -- -- 7.9* --  HCT -- -- 24.9* --  PLT -- -- 237 --  APTT -- -- -- --  LABPROT 23.4* 23.5* -- 29.8*  INR 2.19* 2.20* -- 3.03*  HEPARINUNFRC -- -- -- --  CREATININE -- -- 0.88 --  CKTOTAL -- -- -- --  CKMB -- -- -- --  TROPONINI -- -- -- --    Medical History: Past Medical History  Diagnosis Date  . Lung disease     BRONCHOSPASTIC  . Neuropathy     takes Gabapentin bid  . Hypercholesterolemia     doesn't require meds for this  . Anemia   . COPD (chronic obstructive pulmonary disease)   . Blood transfusion 2012 ?APRIL  . Hypertension     takes Prinizide daily  . Chronic atrial fibrillation     takes Coumadin as instructed  . Peripheral vascular disease   . CHF (congestive heart failure)     takes Lasix every other day  . Shortness of breath     uses spiriva daily;with exertion  . Emphysema   . Pneumonia     hx of   . History of head injury     many yrs ago  . Arthritis     rheumatoid and takes Methotrexate weekly  . Bruises easily   . Open wound     with a foul odor-left big toe  . History of bladder infections   . Vaginal discharge     has seen GYN but says its nothing to worry about  . History of blood transfusion   . Diabetes mellitus     takes Metformin and Amaryl daily  . Hypothyroidism     takes Synthroid daily  . Macular degeneration    dry    Medications:  Prescriptions prior to admission  Medication Sig Dispense Refill  . calcium carbonate (OS-CAL) 600 MG TABS Take 600 mg by mouth daily.      . Calcium-Vitamin D-Vitamin K ([employer] CALCIUM SOFT CHEWS PO) Take 2 tablets by mouth daily.      . Cholecalciferol (VITAMIN D3) 400 UNITS CAPS Take 1 tablet by mouth daily.        . cyanocobalamin 500 MCG tablet Take 500 mcg by mouth daily.      Marland Kitchen doxycycline (VIBRA-TABS) 100 MG tablet Take 100 mg by mouth 2 (two) times daily.      . ferrous fumarate-iron polysaccharide complex (TANDEM) 162-115.2 MG CAPS Take 1 capsule by mouth daily with breakfast.      . fish oil-omega-3 fatty acids 1000 MG capsule Take 1 g by mouth daily.      . folic acid (FOLVITE) 1 MG tablet Take 1 mg by mouth daily.      . furosemide (LASIX) 20 MG tablet Take 1 tablet (20 mg total) by mouth every other day.  30 tablet  3  . gabapentin (NEURONTIN) 300 MG  capsule Take 300 mg by mouth 2 (two) times daily.       Marland Kitchen glimepiride (AMARYL) 4 MG tablet Take 2 mg by mouth daily before breakfast.       . levothyroxine (SYNTHROID, LEVOTHROID) 50 MCG tablet Take 50 mcg by mouth daily with breakfast.       . lisinopril-hydrochlorothiazide (PRINZIDE,ZESTORETIC) 20-25 MG per tablet Take 1 tablet by mouth daily with breakfast.       . metFORMIN (GLUCOPHAGE) 500 MG tablet Take 500 mg by mouth daily with breakfast.       . METHOTREXATE SODIUM IJ Inject 0.4 mLs as directed once a week. On tuesday      . Multiple Vitamin (MULITIVITAMIN WITH MINERALS) TABS Take 1 tablet by mouth daily.      . Multiple Vitamins-Minerals (ICAPS AREDS FORMULA PO) Take 1 capsule by mouth daily with breakfast.      . nitroGLYCERIN (NITRODUR - DOSED IN MG/24 HR) 0.2 mg/hr Place 1 patch onto the skin daily.       . silver sulfADIAZINE (SILVADENE) 1 % cream Apply 1 application topically daily.      Marland Kitchen tiotropium (SPIRIVA) 18 MCG inhalation capsule Place 18 mcg into inhaler and inhale daily with breakfast.         . vitamin C (ASCORBIC ACID) 500 MG tablet Take 500 mg by mouth daily.      Marland Kitchen warfarin (COUMADIN) 5 MG tablet Take 2.5-5 mg by mouth daily. Take 5mg  on Tuesday. Take 2.5mg  the rest of the week        Assessment: 78 y/o female patient resumed on warfarin s/p L BKA on 11/8 for hx Afib and VTE px s/p procedure. INR this morning is therapeutic.  PTA the patient was noted to be taking 2.5 mg daily EXCEPT for 5 mg on Tuesdays only. The patient has been re-educated on warfarin this admission.   Goal of Therapy:  INR 2-3  Plan:  Resume home regimen and change protime to MWF.  Verlene Mayer, PharmD, BCPS Pager [phone] 06/26/2012 1:08 PM

## 2012-06-26 NOTE — Progress Notes (Signed)
Physical Therapy Session Note  Patient Details  Name: Sydney [name] MRN: [id number] Date of Birth: [birth year]  Today's Date: 06/26/2012 Time: 4098-1191 Time Calculation (min): 30 min  Short Term Goals: Week 1:  PT Short Term Goal 1 (Week 1): Pt will perform sit <> stand from variety of surfaces with min assist, min verbal cues PT Short Term Goal 2 (Week 1): Pt will perform stand pivot transfers with min assist PT Short Term Goal 3 (Week 1): Pt will propel wheelchair x 100' over controlled environment with supervision PT Short Term Goal 4 (Week 1): Pt will ambulate x 15' with RW, min assist PT Short Term Goal 5 (Week 1): Pt will report complience with performing HEP 75% of days outside of scheduled therapy time  Skilled Therapeutic Interventions/Progress Updates:    Treatment focused on functional sit to stands and technique with hand placement and correct foot placement. Gait with RW with mod A, initially just shuffling her foot. Discussed how this is unsafe and demonstrated correct technique to push up through her arms. Attempted again and able to go a few hops with mod A with improved technique. Standing balance to work through limits of stability taking one hand off at a time; up to mod A to maintain balance. Min A scoot/squat pivot transfer back to bed.   Therapy Documentation Precautions:  Precautions Precautions: Fall Restrictions Weight Bearing Restrictions: Yes LLE Weight Bearing: Non weight bearing  Pain:  No complaints.  See FIM for current functional status  Therapy/Group: Individual Therapy  Karolee Stamps [facility] 06/26/2012, 3:02 PM

## 2012-06-26 NOTE — Progress Notes (Signed)
Social Work Assessment and Plan Social Work Assessment and Plan  Patient Details  Name: Sydney [name] MRN: [id number] Date of Birth: [birth year]  Today's Date: 06/26/2012  Problem List:  Patient Active Problem List  Diagnosis  . HYPOTHYROIDISM  . DIABETES MELLITUS, TYPE II  . HYPERLIPIDEMIA  . HYPERTENSION  . Atrial fibrillation  . COPD  . Chronic osteomyelitis of toe  . Osteomyelitis of toe  . Osteomyelitis of toe of left foot  . Unilateral complete BKA-left   Past Medical History:  Past Medical History  Diagnosis Date  . Lung disease     BRONCHOSPASTIC  . Neuropathy     takes Gabapentin bid  . Hypercholesterolemia     doesn't require meds for this  . Anemia   . COPD (chronic obstructive pulmonary disease)   . Blood transfusion 2012 ?APRIL  . Hypertension     takes Prinizide daily  . Chronic atrial fibrillation     takes Coumadin as instructed  . Peripheral vascular disease   . CHF (congestive heart failure)     takes Lasix every other day  . Shortness of breath     uses spiriva daily;with exertion  . Emphysema   . Pneumonia     hx of   . History of head injury     many yrs ago  . Arthritis     rheumatoid and takes Methotrexate weekly  . Bruises easily   . Open wound     with a foul odor-left big toe  . History of bladder infections   . Vaginal discharge     has seen GYN but says its nothing to worry about  . History of blood transfusion   . Diabetes mellitus     takes Metformin and Amaryl daily  . Hypothyroidism     takes Synthroid daily  . Macular degeneration     dry   Past Surgical History:  Past Surgical History  Procedure Date  . Total hip arthroplasty     LEFT HIP  . Back surgery   . Fracture surgery 2011    LEFT HIP FX /REPLACEMENT   . Joint replacement     HIP LEFT   . Cholecystectomy 1951  . Dilation and curettage of uterus   . Abdominal hysterectomy 1975  . Amputation 11/09/2011    Procedure: AMPUTATION DIGIT;  Surgeon:  Kathryne Hitch, MD;  Location: [facility] OR;  Service: Orthopedics;  Laterality: Right;  Right Great Toe Amputation  . Eye surgery     bilateral -cataract surgery  . Amputation 01/05/2012    Procedure: AMPUTATION RAY;  Surgeon: Kathryne Hitch, MD;  Location: [facility] ORS;  Service: Orthopedics;  Laterality: Left;  Amputation Left 4th and 5th Toes  . Cardioversion   . Amputation 05/31/2012    Procedure: AMPUTATION RAY;  Surgeon: Nadara Mustard, MD;  Location: [facility] OR;  Service: Orthopedics;  Laterality: Left;  Left foot 1st ray amp  . Amputation 06/21/2012    Procedure: AMPUTATION BELOW KNEE;  Surgeon: Nadara Mustard, MD;  Location: [facility] OR;  Service: Orthopedics;  Laterality: Left;  Left Below Knee Amputation   Social History:  reports that she has quit smoking. Her smoking use included Cigarettes. She quit smokeless tobacco use about 31 years ago. She reports that she does not drink alcohol or use illicit drugs.  Family / Support Systems Marital Status: Widow/Widower How Long?: 31 years Patient Roles: Parent Children: daughter, Sydney [name] (and Sydney [name]) @ (H) [phone] or (C)  [phone]  (also has a son living in [place]) Anticipated Caregiver: Daughter to stay with pt in pt's home Ability/Limitations of Caregiver: S-Light min Caregiver Availability: 24/7 Family Dynamics: both adult children extremely supportive, however, son limited by distance.  Daughter and son-in-law have been providing 24/7 assistance for several weeks PTA  Social History Preferred language: English Religion: Christian Cultural Background: NA Education: college Read: Yes Write: Yes Employment Status: Retired Fish farm manager Issues: none Guardian/Conservator: daughter is pt's POA   Abuse/Neglect Physical Abuse: Denies Verbal Abuse: Denies Sexual Abuse: Denies Exploitation of patient/patient's resources: Denies Self-Neglect: Denies  Emotional Status Pt's affect, behavior adn adjustment status: Pt very  pleasant, oriented woman here with daughter at bedside.  Speaks very optimistically about her gains she anticipates with therapy and pleased she is here for "more agressive" schedule than a SNF.  Denies any emotional distress in general or related to her amputation.  Will monitor throughout stay and suggest peer visit if this is thought would be helpful. Recent Psychosocial Issues: None Pyschiatric History: None Substance Abuse History: None  Patient / Family Perceptions, Expectations & Goals Pt/Family understanding of illness & functional limitations: Pt and daughter with good understanding and awareness of medical issues which led to need for BKA and of her current functional limitations. Premorbid pt/family roles/activities: As noted, family was providing 24/7 support to pt a few weeks PTA, however, pt independent prior to decline.   Still driving and managing her home independently.  Daughter does manage her finances. Anticipated changes in roles/activities/participation: Little change anticipated than just PTA - family to continue with 24/7 Pt/family expectations/goals: "I want to be able to do the things that I used to be able to do."  [employer]: None Premorbid Home Care/DME Agencies: Other (Comment) [facility]) Transportation available at discharge: yes Resource referrals recommended: Support group (specify)  Discharge Planning Living Arrangements: Alone Support Systems: Children;Friends/neighbors;Church/faith community Type of Residence: Private residence Insurance Resources: Administrator (specify) Herbalist) Financial Resources: Restaurant manager, fast food Screen Referred: No Living Expenses: Own Money Management: Family Do you have any problems obtaining your medications?: No Home Management: Pt pirmarily but does have housekeeper q week Patient/Family Preliminary Plans: Pt plans to return to her own home with daughter to stay and provide 24/7  "until I can do things by myself" Expected length of stay: 10-14 days  Clinical Impression Very pleasant, oriented and motivated woman here after BKA.  Good understanding of CIR purpose and optimistic about gains she can make.  Denies any emotional distress, however, will monitor throughout stay.  Good family support.  Sydney [name] 06/26/2012, 2:34 PM

## 2012-06-27 ENCOUNTER — Inpatient Hospital Stay (HOSPITAL_COMMUNITY): Payer: Medicare Other | Admitting: Occupational Therapy

## 2012-06-27 ENCOUNTER — Inpatient Hospital Stay (HOSPITAL_COMMUNITY): Payer: Medicare Other | Admitting: *Deleted

## 2012-06-27 ENCOUNTER — Inpatient Hospital Stay (HOSPITAL_COMMUNITY): Payer: Medicare Other | Admitting: Physical Therapy

## 2012-06-27 DIAGNOSIS — S88119A Complete traumatic amputation at level between knee and ankle, unspecified lower leg, initial encounter: Secondary | ICD-10-CM

## 2012-06-27 DIAGNOSIS — I739 Peripheral vascular disease, unspecified: Secondary | ICD-10-CM

## 2012-06-27 DIAGNOSIS — L98499 Non-pressure chronic ulcer of skin of other sites with unspecified severity: Secondary | ICD-10-CM

## 2012-06-27 LAB — GLUCOSE, CAPILLARY
Glucose-Capillary: 149 mg/dL — ABNORMAL HIGH (ref 70–99)
Glucose-Capillary: 134 mg/dL — ABNORMAL HIGH (ref 70–99)

## 2012-06-27 LAB — CBC
HCT: 24.3 % — ABNORMAL LOW (ref 36.0–46.0)
MCHC: 32.1 g/dL (ref 30.0–36.0)
RDW: 16.6 % — ABNORMAL HIGH (ref 11.5–15.5)

## 2012-06-27 MED ORDER — GABAPENTIN 300 MG PO CAPS
300.0000 mg | ORAL_CAPSULE | Freq: Three times a day (TID) | ORAL | Status: DC
  Administered 2012-06-27 – 2012-06-29 (×7): 300 mg via ORAL
  Filled 2012-06-27 (×10): qty 1

## 2012-06-27 NOTE — Progress Notes (Signed)
Per report LBM 06/24/12. Pt refusing any laxative or other interventions at this time.

## 2012-06-27 NOTE — Evaluation (Signed)
Recreational Therapy Assessment and Plan  Patient Details  Name: Sydney [name] MRN: [id number] Date of Birth: 08-Oct-1920 Today's Date: 06/27/2012  Rehab Potential: Good ELOS: 10 days   Assessment Clinical Impression: Problem List:  Patient Active Problem List   Diagnosis   .  HYPOTHYROIDISM   .  DIABETES MELLITUS, TYPE II   .  HYPERLIPIDEMIA   .  HYPERTENSION   .  Atrial fibrillation   .  COPD   .  Chronic osteomyelitis of toe   .  Osteomyelitis of toe   .  Osteomyelitis of toe of left foot   .  Unilateral complete BKA-left    Past Medical History:  Past Medical History   Diagnosis  Date   .  Lung disease      BRONCHOSPASTIC   .  Neuropathy      takes Gabapentin bid   .  Hypercholesterolemia      doesn't require meds for this   .  Anemia    .  COPD (chronic obstructive pulmonary disease)    .  Blood transfusion  2012 ?APRIL   .  Hypertension      takes Prinizide daily   .  Chronic atrial fibrillation      takes Coumadin as instructed   .  Peripheral vascular disease    .  CHF (congestive heart failure)      takes Lasix every other day   .  Shortness of breath      uses spiriva daily;with exertion   .  Emphysema    .  Pneumonia      hx of   .  History of head injury      many yrs ago   .  Arthritis      rheumatoid and takes Methotrexate weekly   .  Bruises easily    .  Open wound      with a foul odor-left big toe   .  History of bladder infections    .  Vaginal discharge      has seen GYN but says its nothing to worry about   .  History of blood transfusion    .  Diabetes mellitus      takes Metformin and Amaryl daily   .  Hypothyroidism      takes Synthroid daily   .  Macular degeneration      dry    Past Surgical History:  Past Surgical History   Procedure  Date   .  Total hip arthroplasty      LEFT HIP   .  Back surgery    .  Fracture surgery  2011     LEFT HIP FX /REPLACEMENT   .  Joint replacement      HIP LEFT   .  Cholecystectomy  1951    .  Dilation and curettage of uterus    .  Abdominal hysterectomy  1975   .  Amputation  11/09/2011     Procedure: AMPUTATION DIGIT; Surgeon: Kathryne Hitch, MD; [facility]; Service: Orthopedics; Laterality: Right; Right Great Toe Amputation   .  Eye surgery      bilateral -cataract surgery   .  Amputation  01/05/2012     Procedure: AMPUTATION RAY; Surgeon: Kathryne Hitch, MD; Location: [facility] ORS; Service: Orthopedics; Laterality: Left; Amputation Left 4th and 5th Toes   .  Cardioversion    .  Amputation  05/31/2012     Procedure: AMPUTATION  RAY; Surgeon: Nadara Mustard, MD; Location: [facility] OR; Service: Orthopedics; Laterality: Left; Left foot 1st ray amp   .  Amputation  06/21/2012     Procedure: AMPUTATION BELOW KNEE; Surgeon: Nadara Mustard, MD; Location: [facility] OR; Service: Orthopedics; Laterality: Left; Left Below Knee Amputation    Assessment & Plan  Clinical Impression: Sydney [name] is a 78 y.o. right-handed female with history of atrial fibrillation on chronic Coumadin therapy, COPD, diastolic congestive heart failure and peripheral vascular disease. Patient was admitted 06/21/2012 with gangrenous left and chronic osteomyelitis. Recent left foot first ray amputation 05/31/2012 and discharge to home 06/01/2012. Noted progressive necrotic gangrenous changes with exposed bone foul-smelling odor. Patient underwent left below knee amputation 06/21/2012 per Dr. Lajoyce Corners. Post op chronic coumadin resumed. Has acute on chronic anemia with Hgb @ 8.3. Patient transferred to CIR on 06/24/2012.   Pt presents with decreased activity tolerance, decreased functional mobility, decreased balance, increased pain Limiting pt's independence with leisure/community pursuits.   Leisure History/Participation Premorbid leisure interest/current participation: Crafts - Sewing;Crafts - Other (Comment);Community - Journalist, newspaper - Travel (Comment);Community Recruitment consultant (Comment) Expression Interests:  Music (Comment) Other Leisure Interests: Television;Reading;Cooking/Baking (news only) Leisure Participation Style: Alone;With Family/Friends Awareness of Community Resources: Good-identify 3 post discharge leisure resources Psychosocial / Spiritual Spiritual Interests: Church;Womens'Men's Groups Social interaction - Mood/Behavior: Cooperative Film/video editor for Education?: Yes Strengths/Weaknesses Patient Strengths/Abilities: Willingness to participate Patient weaknesses: Physical limitations  Plan Rec Therapy Plan Is patient appropriate for Therapeutic Recreation?: Yes Rehab Potential: Good Treatment times per week: Min 1 time per week >20 minutes Estimated Length of Stay: 10 days TR Treatment/Interventions: Adaptive equipment instruction;1:1 session;Balance/vestibular training;Community reintegration;Functional mobility training;Group participation (Comment);Patient/family education;Recreation/leisure participation;Therapeutic activities;UE/LE Coordination activities;Wheelchair propulsion/positioning  Recommendations for other services: None  Discharge Criteria: Patient will be discharged from TR if patient refuses treatment 3 consecutive times without medical reason.  If treatment goals not met, if there is a change in medical status, if patient makes no progress towards goals or if patient is discharged from hospital.  The above assessment, treatment plan, treatment alternatives and goals were discussed and mutually agreed upon: by patient  Arno Cullers 06/27/2012, 3:46 PM

## 2012-06-27 NOTE — Progress Notes (Signed)
Physical Therapy Session Note  Patient Details  Name: Sydney [name] MRN: [id number] Date of Birth: [birth year]  Today's Date: 06/27/2012 Time: 4098-1191 Time Calculation (min): 28 min  Short Term Goals: Week 1:  PT Short Term Goal 1 (Week 1): Pt will perform sit <> stand from variety of surfaces with min assist, min verbal cues PT Short Term Goal 2 (Week 1): Pt will perform stand pivot transfers with min assist PT Short Term Goal 3 (Week 1): Pt will propel wheelchair x 100' over controlled environment with supervision PT Short Term Goal 4 (Week 1): Pt will ambulate x 15' with RW, min assist PT Short Term Goal 5 (Week 1): Pt will report complience with performing HEP 75% of days outside of scheduled therapy time  Skilled Therapeutic Interventions/Progress Updates:    Pt continues with report of severe phantom pain. Initial aspect of session focused on practicing visualization and desensitization techniques, pt found desensitization technique with towel most helpful and reports decreased pain. Gait x 5' with RW, pt fatigued and had increased difficulty clearing Rt. Foot/shoe despite verbal and tactile cues for utilization of bil. UEs. Scoot pivot with min-guard assist to bed.   Therapy Documentation Precautions:  Precautions Precautions: Fall Restrictions Weight Bearing Restrictions: Yes LLE Weight Bearing: Non weight bearing Pain: Pain Assessment Faces Pain Scale: Hurts even more Pain Type: Phantom pain Pain Location: Leg Pain Orientation: Left Pain Descriptors: Tingling;Sharp Pain Onset: Sudden Pain Intervention(s): Repositioned (pt had been premedicated, see PT notes) Locomotion : Ambulation Ambulation/Gait Assistance: 3: Mod assist   See FIM for current functional status  Therapy/Group: Individual Therapy  Wilhemina Bonito 06/27/2012, 5:30 PM

## 2012-06-27 NOTE — Progress Notes (Signed)
Occupational Therapy Session Notes  Patient Details  Name: Sydney [name] MRN: [id number] Date of Birth: [birth year]  Today's Date: 06/27/2012  Short Term Goals: Week 1:  OT Short Term Goal 1 (Week 1): Patient will bathe 9/9 body parts with min/steady assist OT Short Term Goal 2 (Week 1): Education and practice regarding lateral leans will be addressed to help increase independence with BADLs OT Short Term Goal 3 (Week 1): Patient will perform toileting tasks with moderate assistance (2/3 steps) OT Short Term Goal 4 (Week 1): Patient willl perform sit-> stands with min assist to help increase independence with BADLs  Skilled Therapeutic Interventions/Progress Updates:   Session #1 [id number] - 55 Minutes Individual Therapy No complaints of pain Patient found seated edge of bed eating breakfast. Patient verbalized concern about phantom pain last night, "I cried it was so bad!". Discussed previous education taught about pain management for phantom pain with patient. Patient transferred OOB -> w/c with min assist and engaged in ADL retraining at sink level. Focused skilled intervention on bed mobility, transfers, UB/LB bathing & dressing, lateral leans for peri care, sit/stands, dynamic standing balance/tolerance/endurance (patient stood to pull underwear & pants up over waist), use of inspection mirror to inspect RLE/foot, overall activity tolerance/endurance, and grooming tasks seated at sink. At end of session left patient seated in w/c beside bed with call bell & phone within reach.   Session #2 [id number] - 45 Minutes Individual Therapy No complaints of pain Patient found seated in w/c upon entering room. Focused on patient propelling self from room -> therapy gym; patient with difficult time gripping onto wheels - especially left side. Therapist modified w/c using theraband to help increase patient's independence with w/c propulsion, mobility, and maneuvering. Patient then transferred onto  therapy mat to work on lateral leans for pulling pants up to waist and pulling pants down. Also, while on mat, focused on UE strengthening using theraband. Encouraged patient to engage in HEP of lateral leans, UE strengthening exercises using theraband, and UE strengthening exercises doing w/c pushups. Therapist propelled patient back to room and left seated in w/c beside bed with call bell & phone within reach.   Precautions:  Precautions Precautions: Fall Restrictions Weight Bearing Restrictions: Yes LLE Weight Bearing: Non weight bearing  See FIM for current functional status  Haston Casebolt 06/27/2012, 7:26 AM

## 2012-06-27 NOTE — Progress Notes (Signed)
Patient ID: Sydney [name], female   DOB: 1921-06-09, 78 y.o.   MRN: [id number] Sydney [name] is a 78 y.o. right-handed female with history of atrial fibrillation on chronic Coumadin therapy, COPD, diastolic congestive heart failure and peripheral vascular disease. Patient was admitted 06/21/2012 with gangrenous left and chronic osteomyelitis. Recent left foot first ray amputation 05/31/2012 and discharge to home 06/01/2012. Noted progressive necrotic gangrenous changes with exposed bone foul-smelling odor. Patient underwent left below knee amputation 06/21/2012 per Dr. Lajoyce Corners. Post op chronic coumadin resumed.  Subjective/Complaints: Had a good day with therapies. Had a lot of phantom limb pain last night. Incisional pain is minimal   Had BM this morning.   Objective: Vital Signs: Blood pressure 148/72, pulse 76, temperature 98.2 F (36.8 C), temperature source Oral, resp. rate 18, weight 59.4 kg (130 lb 15.3 oz), SpO2 100.00%. No results found. Results for orders placed during the hospital encounter of 06/24/12 (from the past 72 hour(s))  GLUCOSE, CAPILLARY     Status: Abnormal   Collection Time   06/24/12  5:41 PM      Component Value Range Comment   Glucose-Capillary 68 (*) 70 - 99 mg/dL   GLUCOSE, CAPILLARY     Status: Abnormal   Collection Time   06/24/12  6:45 PM      Component Value Range Comment   Glucose-Capillary 150 (*) 70 - 99 mg/dL    Comment 1 Notify RN     GLUCOSE, CAPILLARY     Status: Abnormal   Collection Time   06/24/12  8:23 PM      Component Value Range Comment   Glucose-Capillary 163 (*) 70 - 99 mg/dL    Comment 1 Notify RN     GLUCOSE, CAPILLARY     Status: Abnormal   Collection Time   06/25/12  6:45 AM      Component Value Range Comment   Glucose-Capillary 108 (*) 70 - 99 mg/dL   CBC WITH DIFFERENTIAL     Status: Abnormal   Collection Time   06/25/12  6:59 AM      Component Value Range Comment   WBC 5.0  4.0 - 10.5 K/uL    RBC 2.78 (*) 3.87 - 5.11 MIL/uL      Hemoglobin 7.9 (*) 12.0 - 15.0 g/dL    HCT 40.9 (*) 81.1 - 46.0 %    MCV 89.6  78.0 - 100.0 fL    MCH 28.4  26.0 - 34.0 pg    MCHC 31.7  30.0 - 36.0 g/dL    RDW 91.4 (*) 78.2 - 15.5 %    Platelets 237  150 - 400 K/uL    Neutrophils Relative 64  43 - 77 %    Neutro Abs 3.2  1.7 - 7.7 K/uL    Lymphocytes Relative 21  12 - 46 %    Lymphs Abs 1.1  0.7 - 4.0 K/uL    Monocytes Relative 9  3 - 12 %    Monocytes Absolute 0.4  0.1 - 1.0 K/uL    Eosinophils Relative 5  0 - 5 %    Eosinophils Absolute 0.3  0.0 - 0.7 K/uL    Basophils Relative 0  0 - 1 %    Basophils Absolute 0.0  0.0 - 0.1 K/uL   COMPREHENSIVE METABOLIC PANEL     Status: Abnormal   Collection Time   06/25/12  6:59 AM      Component Value Range Comment   Sodium 137  135 - 145 mEq/L    Potassium 3.7  3.5 - 5.1 mEq/L    Chloride 99  96 - 112 mEq/L    CO2 27  19 - 32 mEq/L    Glucose, Bld 112 (*) 70 - 99 mg/dL    BUN 19  6 - 23 mg/dL    Creatinine, Ser 1.61  0.50 - 1.10 mg/dL    Calcium 8.6  8.4 - 09.6 mg/dL    Total Protein 6.1  6.0 - 8.3 g/dL    Albumin 2.8 (*) 3.5 - 5.2 g/dL    AST 17  0 - 37 U/L    ALT <5  0 - 35 U/L    Alkaline Phosphatase 63  39 - 117 U/L    Total Bilirubin 0.3  0.3 - 1.2 mg/dL    GFR calc non Af Amer 57 (*) >90 mL/min    GFR calc Af Amer 66 (*) >90 mL/min   PROTIME-INR     Status: Abnormal   Collection Time   06/25/12 10:28 AM      Component Value Range Comment   Prothrombin Time 23.5 (*) 11.6 - 15.2 seconds    INR 2.20 (*) 0.00 - 1.49   GLUCOSE, CAPILLARY     Status: Abnormal   Collection Time   06/25/12 11:21 AM      Component Value Range Comment   Glucose-Capillary 177 (*) 70 - 99 mg/dL   GLUCOSE, CAPILLARY     Status: Abnormal   Collection Time   06/25/12  4:46 PM      Component Value Range Comment   Glucose-Capillary 158 (*) 70 - 99 mg/dL   GLUCOSE, CAPILLARY     Status: Abnormal   Collection Time   06/25/12  9:20 PM      Component Value Range Comment   Glucose-Capillary 183  (*) 70 - 99 mg/dL   PROTIME-INR     Status: Abnormal   Collection Time   06/26/12  6:05 AM      Component Value Range Comment   Prothrombin Time 23.4 (*) 11.6 - 15.2 seconds    INR 2.19 (*) 0.00 - 1.49   GLUCOSE, CAPILLARY     Status: Abnormal   Collection Time   06/26/12  7:52 AM      Component Value Range Comment   Glucose-Capillary 118 (*) 70 - 99 mg/dL    Comment 1 Documented in Chart      Comment 2 Notify RN     GLUCOSE, CAPILLARY     Status: Abnormal   Collection Time   06/26/12 11:37 AM      Component Value Range Comment   Glucose-Capillary 147 (*) 70 - 99 mg/dL   GLUCOSE, CAPILLARY     Status: Abnormal   Collection Time   06/26/12  4:29 PM      Component Value Range Comment   Glucose-Capillary 113 (*) 70 - 99 mg/dL   GLUCOSE, CAPILLARY     Status: Abnormal   Collection Time   06/26/12  9:48 PM      Component Value Range Comment   Glucose-Capillary 114 (*) 70 - 99 mg/dL    Comment 1 Notify RN        HEENT: normal Cardio: RRR and IRIR, posititive murmur Resp: CTA B/L GI: BS positive Extremity:  No Edema Skin:   Intact and Other Dry skin on RLE.  No breakdown Neuro: Alert/Oriented and Normal Sensory Musc/Skel:  Normal and Other L-BKA with compressive dressing. Good rom  except lacking a few degrees in extension at the left knee Gen:  NAD   Assessment/Plan: 1. Functional deficits secondary to L BKA which require 3+ hours per day of interdisciplinary therapy in a comprehensive inpatient rehab setting. Physiatrist is providing close team supervision and 24 hour management of active medical problems listed below. Physiatrist and rehab team continue to assess barriers to discharge/monitor patient progress toward functional and medical goals.  Should be a good prosthetic candidate. Will ask advanced to see her today for education on process FIM: FIM - Bathing Bathing Steps Patient Completed: Chest;Right Arm;Left Arm;Abdomen;Right upper leg;Left upper leg;Right lower  leg (including foot) Bathing: 4: Min-Patient completes 8-9 76 10 parts or 75+ percent  FIM - Upper Body Dressing/Undressing Upper body dressing/undressing steps patient completed: Thread/unthread right sleeve of pullover shirt/dresss;Thread/unthread left sleeve of pullover shirt/dress;Put head through opening of pull over shirt/dress;Pull shirt over trunk Upper body dressing/undressing: 5: Set-up assist to: Obtain clothing/put away FIM - Lower Body Dressing/Undressing Lower body dressing/undressing steps patient completed: Thread/unthread right underwear leg;Thread/unthread left underwear leg;Thread/unthread right pants leg;Thread/unthread left pants leg Lower body dressing/undressing: 3: Mod-Patient completed 50-74% of tasks  FIM - Toileting Toileting: 0: Activity did not occur  FIM - Diplomatic Services operational officer Devices: Bedside commode;Elevated toilet seat;Grab bars Toilet Transfers: 0-Activity did not occur  FIM - Banker Devices: Arm rests Bed/Chair Transfer: 5: Sit > Supine: Supervision (verbal cues/safety issues);4: Chair or W/C > Bed: Min A (steadying Pt. > 75%)  FIM - Locomotion: Wheelchair Distance: 100 Locomotion: Wheelchair: 4: Travels 150 ft or more: maneuvers on rugs and over door sillls with minimal assistance (Pt.>75%) FIM - Locomotion: Ambulation Locomotion: Ambulation Assistive Devices: Designer, industrial/product Ambulation/Gait Assistance: 3: Mod assist Locomotion: Ambulation: 1: Travels less than 50 ft with moderate assistance (Pt: 50 - 74%)  Comprehension Comprehension Mode: Auditory Comprehension: 6-Follows complex conversation/direction: With extra time/assistive device  Expression Expression Mode: Verbal Expression: 6-Expresses complex ideas: With extra time/assistive device  Social Interaction Social Interaction: 6-Interacts appropriately with others with medication or extra time (anti-anxiety,  antidepressant).  Problem Solving Problem Solving: 6-Solves complex problems: With extra time  Memory Memory: 6-More than reasonable amt of time  Medical Problem List and Plan:  1. DVT Prophylaxis/Anticoagulation: Pharmaceutical: Coumadin  2. Pain Management: prn medications effective.   -increase gabapentin for phantom limb pain. Be careful not to oversedate 3. Mood: Has a good outlook overall. LCSW to follow up for formal evaluation.  4. Neuropsych: This patient is capable of making decisions on his/her own behalf.  5. DM type 2: monitor with AC/HS CBG checks. Continue metformin and amaryl daily. Use SSI for elevated BS. No hypoglycemia in last 24 hours. 6. Atrial Fibrillation: Monitor HR with bid checks. Needs to have HR checked apically. Continue coumadin.  7. COPD: continue spirivia for symptom management  8. HTN: Will monitor with bid checks. Will need to watch renal status with two diuretics on board. Continue HCTZ, lasix and lisinopril.  Labs on 11/12 without signs of renal insufficiency.  9. ABLA:  Iron supplement added.   hgb 7.8 continue to monitor   LOS (Days) 3 A FACE TO FACE EVALUATION WAS PERFORMED  [name],[name] 06/27/2012, 6:48 AM

## 2012-06-27 NOTE — Progress Notes (Signed)
Physical Therapy Note  Patient Details  Name: CHRISANDRA WIEMERS MRN: [id number] Date of Birth: Jul 31, 1921 Today's Date: 06/27/2012  900-954 54 minutes  No c/o pain.  Pt reports she has had episodes of phantom pain.  Educated pt on techniques to resolve, pt and daughter express understanding.  W/c mobility in controlled environment with supervision for straight line, needs min A for turning.  Transfer training to strong side with supervision, to L side with min A.  Gait training x 20' with mod A, frequent standing rests, pt fatigues easily and drags vs "hops" on R LE.  Educated on importance of using UEs, pt able to perform for very short distances but returns to shuffling LE when fatigued.  Standing balance with UE reaching task with min A with 1 UE support. Supine stretching B hips.  L hip painful with rotations but good ROM for flexion and able to get to neutral extension.  R hip stretched into hip ER, able to tolerate slight overpressure.  Supine TE for B LE strengthening in pain free range.  Individual therapy  DONAWERTH,KAREN 06/27/2012, 9:55 AM

## 2012-06-28 ENCOUNTER — Inpatient Hospital Stay (HOSPITAL_COMMUNITY): Payer: Medicare Other

## 2012-06-28 ENCOUNTER — Inpatient Hospital Stay (HOSPITAL_COMMUNITY): Payer: Medicare Other | Admitting: Occupational Therapy

## 2012-06-28 LAB — GLUCOSE, CAPILLARY: Glucose-Capillary: 100 mg/dL — ABNORMAL HIGH (ref 70–99)

## 2012-06-28 LAB — PROTIME-INR: INR: 2.09 — ABNORMAL HIGH (ref 0.00–1.49)

## 2012-06-28 NOTE — Progress Notes (Signed)
Physical Therapy Session Note  Patient Details  Name: Sydney [name] MRN: [id number] Date of Birth: Jul 24, 1921  Today's Date: 06/28/2012 Time: 1300-1330 Time Calculation (min): 30 min  Short Term Goals: Week 1:  PT Short Term Goal 1 (Week 1): Pt will perform sit <> stand from variety of surfaces with min assist, min verbal cues PT Short Term Goal 2 (Week 1): Pt will perform stand pivot transfers with min assist PT Short Term Goal 3 (Week 1): Pt will propel wheelchair x 100' over controlled environment with supervision PT Short Term Goal 4 (Week 1): Pt will ambulate x 15' with RW, min assist PT Short Term Goal 5 (Week 1): Pt will report complience with performing HEP 75% of days outside of scheduled therapy time  Skilled Therapeutic Interventions/Progress Updates:    w/c propulsion on unit for mobility training, strengthening, and overall endurance to therapy gym with S. Using list made in earlier therapy session for w/c parts management focused on pt preparing and setting up w/c for transfer. Even with list pt required max verbal cues and hand over hand assist to complete task. Transferred with light steady A. In supine and R sidelying worked on quad sets, hip and knee flexion/extension, and sidelying abduction x 10 reps each for functional strengthening and ROM to residual limb.   Therapy Documentation Precautions:  Precautions Precautions: Fall Restrictions Weight Bearing Restrictions: Yes LLE Weight Bearing: Non weight bearing Pain: Pain Assessment Pain Assessment: 0-10 Pain Score:   6 Pain Type: Phantom pain Pain Location: Leg Pain Orientation: Left Pain Descriptors: Sharp Pain Onset: Gradual Pain Intervention(s): Medication (See eMAR)  See FIM for current functional status  Therapy/Group: Individual Therapy  Karolee Stamps [facility] 06/28/2012, 2:38 PM

## 2012-06-28 NOTE — Progress Notes (Signed)
ANTICOAGULATION CONSULT NOTE - Follow up  Consult  Pharmacy Consult for coumadin Indication: VTE prophylaxis, h/o afib  Allergies  Allergen Reactions  . Other     Narcotics=makes her very disoriented  . Percocet (Oxycodone-Acetaminophen) Other (See Comments)    Dilerium, hallucinations  . Tramadol Anxiety  . Vicodin (Hydrocodone-Acetaminophen) Anxiety    Patient Measurements: Wt= 59.8kg  Vital Signs: Temp: 97.9 F (36.6 C) (11/15 0500) Temp src: Oral (11/15 0500) BP: 134/45 mmHg (11/15 0500) Pulse Rate: 64  (11/15 0500)  Labs:  Basename 06/28/12 0535 06/27/12 0622 06/26/12 0605  HGB -- 7.8* --  HCT -- 24.3* --  PLT -- 237 --  APTT -- -- --  LABPROT 22.6* -- 23.4*  INR 2.09* -- 2.19*  HEPARINUNFRC -- -- --  CREATININE -- -- --  CKTOTAL -- -- --  CKMB -- -- --  TROPONINI -- -- --    Medical History: Past Medical History  Diagnosis Date  . Lung disease     BRONCHOSPASTIC  . Neuropathy     takes Gabapentin bid  . Hypercholesterolemia     doesn't require meds for this  . Anemia   . COPD (chronic obstructive pulmonary disease)   . Blood transfusion 2012 ?APRIL  . Hypertension     takes Prinizide daily  . Chronic atrial fibrillation     takes Coumadin as instructed  . Peripheral vascular disease   . CHF (congestive heart failure)     takes Lasix every other day  . Shortness of breath     uses spiriva daily;with exertion  . Emphysema   . Pneumonia     hx of   . History of head injury     many yrs ago  . Arthritis     rheumatoid and takes Methotrexate weekly  . Bruises easily   . Open wound     with a foul odor-left big toe  . History of bladder infections   . Vaginal discharge     has seen GYN but says its nothing to worry about  . History of blood transfusion   . Diabetes mellitus     takes Metformin and Amaryl daily  . Hypothyroidism     takes Synthroid daily  . Macular degeneration     dry    Medications:  Prescriptions prior to  admission  Medication Sig Dispense Refill  . calcium carbonate (OS-CAL) 600 MG TABS Take 600 mg by mouth daily.      . Calcium-Vitamin D-Vitamin K ([employer] CALCIUM SOFT CHEWS PO) Take 2 tablets by mouth daily.      . Cholecalciferol (VITAMIN D3) 400 UNITS CAPS Take 1 tablet by mouth daily.        . cyanocobalamin 500 MCG tablet Take 500 mcg by mouth daily.      Marland Kitchen doxycycline (VIBRA-TABS) 100 MG tablet Take 100 mg by mouth 2 (two) times daily.      . ferrous fumarate-iron polysaccharide complex (TANDEM) 162-115.2 MG CAPS Take 1 capsule by mouth daily with breakfast.      . fish oil-omega-3 fatty acids 1000 MG capsule Take 1 g by mouth daily.      . folic acid (FOLVITE) 1 MG tablet Take 1 mg by mouth daily.      . furosemide (LASIX) 20 MG tablet Take 1 tablet (20 mg total) by mouth every other day.  30 tablet  3  . gabapentin (NEURONTIN) 300 MG capsule Take 300 mg by mouth 2 (two) times daily.       Marland Kitchen  glimepiride (AMARYL) 4 MG tablet Take 2 mg by mouth daily before breakfast.       . levothyroxine (SYNTHROID, LEVOTHROID) 50 MCG tablet Take 50 mcg by mouth daily with breakfast.       . lisinopril-hydrochlorothiazide (PRINZIDE,ZESTORETIC) 20-25 MG per tablet Take 1 tablet by mouth daily with breakfast.       . metFORMIN (GLUCOPHAGE) 500 MG tablet Take 500 mg by mouth daily with breakfast.       . METHOTREXATE SODIUM IJ Inject 0.4 mLs as directed once a week. On tuesday      . Multiple Vitamin (MULITIVITAMIN WITH MINERALS) TABS Take 1 tablet by mouth daily.      . Multiple Vitamins-Minerals (ICAPS AREDS FORMULA PO) Take 1 capsule by mouth daily with breakfast.      . nitroGLYCERIN (NITRODUR - DOSED IN MG/24 HR) 0.2 mg/hr Place 1 patch onto the skin daily.       . silver sulfADIAZINE (SILVADENE) 1 % cream Apply 1 application topically daily.      Marland Kitchen tiotropium (SPIRIVA) 18 MCG inhalation capsule Place 18 mcg into inhaler and inhale daily with breakfast.       . vitamin C (ASCORBIC ACID) 500 MG tablet Take  500 mg by mouth daily.      Marland Kitchen warfarin (COUMADIN) 5 MG tablet Take 2.5-5 mg by mouth daily. Take 5mg  on Tuesday. Take 2.5mg  the rest of the week        Assessment: 78 y.o. F resumed on warfarin s/p L BKA on 11/8 for hx Afib and VTE px s/p procedure. INR this morning remains therapeutic on home regimen (INR 2.09, goal of 2-3)  PTA the patient was noted to be taking 2.5 mg daily EXCEPT for 5 mg on Tuesdays only. The patient has been re-educated on warfarin this admission.   Goal of Therapy:  INR 2-3  Plan:  1. Continue warfarin 2.5 mg daily EXCEPT for 5 mg on Tuesdays only 2. Will continue to monitor for any signs/symptoms of bleeding and will follow up with PT/INR in the a.m.   Georgina Pillion, PharmD, BCPS Clinical Pharmacist Pager: [phone] 06/28/2012 1:48 PM

## 2012-06-28 NOTE — Progress Notes (Signed)
Physical Therapy Session Note  Patient Details  Name: Sydney [name] MRN: [id number] Date of Birth: 1921-02-07  Today's Date: 06/28/2012 Time: 1000-1100 Time Calculation (min): 60 min  Short Term Goals: Week 1:  PT Short Term Goal 1 (Week 1): Pt will perform sit <> stand from variety of surfaces with min assist, min verbal cues PT Short Term Goal 2 (Week 1): Pt will perform stand pivot transfers with min assist PT Short Term Goal 3 (Week 1): Pt will propel wheelchair x 100' over controlled environment with supervision PT Short Term Goal 4 (Week 1): Pt will ambulate x 15' with RW, min assist PT Short Term Goal 5 (Week 1): Pt will report complience with performing HEP 75% of days outside of scheduled therapy time  Skilled Therapeutic Interventions/Progress Updates:    Discussed equipment needs and home environment set up for d/c planning with pt and daughter and educated on ways to make house more accessible. Recommended using her personal transport chair for smaller spaces for increased access and energy conservation as pt has difficulty with tight turns and pt and daughter in agreement. Daughter reports she has already discussed current w/c with social worker who is looking into getting appropriate amputee pad or all together replacement w/c. Pt will benefit from lightweight w/c due to premorbid RA and for energy conservation.   Gait training with RW with emphasis on using UEs to lift to clear foot; pt still having difficulty and requires mod A x 8'. Focused remainder of session on w/c parts management, setting up for transfers, and scoot pivot transfers to/from mat. Wrote out a step by step process with recommendation for pt to use during transfers as she demonstrated poor carryover of techniques previously reviewed. Therapist also put velcro on parts of w/c for leg rest management to aid pt due to poor sensation in fingers. W/c propulsion for endurance and strengthening shorter distance with S in  straight line.  Therapy Documentation Precautions:  Precautions Precautions: Fall Restrictions Weight Bearing Restrictions: Yes LLE Weight Bearing: Non weight bearing Pain: Premedicated - reports feeling pretty good today.   Locomotion : Ambulation Ambulation/Gait Assistance: 3: Mod assist   See FIM for current functional status  Therapy/Group: Individual Therapy  Karolee Stamps [facility] 06/28/2012, 11:42 AM

## 2012-06-28 NOTE — Progress Notes (Signed)
Patient ID: Sydney [name], female   DOB: 03/23/1921, 78 y.o.   MRN: [id number] Sydney [name] is a 78 y.o. right-handed female with history of atrial fibrillation on chronic Coumadin therapy, COPD, diastolic congestive heart failure and peripheral vascular disease. Patient was admitted 06/21/2012 with gangrenous left and chronic osteomyelitis. Recent left foot first ray amputation 05/31/2012 and discharge to home 06/01/2012. Noted progressive necrotic gangrenous changes with exposed bone foul-smelling odor. Patient underwent left below knee amputation 06/21/2012 per Dr. Lajoyce Corners. Post op chronic coumadin resumed.  Subjective/Complaints: Had a good day with therapies. Phantom pain better with increased neurontin.. Incisional pain is minimal      Objective: Vital Signs: Blood pressure 134/45, pulse 64, temperature 97.9 F (36.6 C), temperature source Oral, resp. rate 19, weight 59.4 kg (130 lb 15.3 oz), SpO2 99.00%. No results found. Results for orders placed during the hospital encounter of 06/24/12 (from the past 72 hour(s))  PROTIME-INR     Status: Abnormal   Collection Time   06/25/12 10:28 AM      Component Value Range Comment   Prothrombin Time 23.5 (*) 11.6 - 15.2 seconds    INR 2.20 (*) 0.00 - 1.49   GLUCOSE, CAPILLARY     Status: Abnormal   Collection Time   06/25/12 11:21 AM      Component Value Range Comment   Glucose-Capillary 177 (*) 70 - 99 mg/dL   GLUCOSE, CAPILLARY     Status: Abnormal   Collection Time   06/25/12  4:46 PM      Component Value Range Comment   Glucose-Capillary 158 (*) 70 - 99 mg/dL   GLUCOSE, CAPILLARY     Status: Abnormal   Collection Time   06/25/12  9:20 PM      Component Value Range Comment   Glucose-Capillary 183 (*) 70 - 99 mg/dL   PROTIME-INR     Status: Abnormal   Collection Time   06/26/12  6:05 AM      Component Value Range Comment   Prothrombin Time 23.4 (*) 11.6 - 15.2 seconds    INR 2.19 (*) 0.00 - 1.49   GLUCOSE, CAPILLARY     Status:  Abnormal   Collection Time   06/26/12  7:52 AM      Component Value Range Comment   Glucose-Capillary 118 (*) 70 - 99 mg/dL    Comment 1 Documented in Chart      Comment 2 Notify RN     GLUCOSE, CAPILLARY     Status: Abnormal   Collection Time   06/26/12 11:37 AM      Component Value Range Comment   Glucose-Capillary 147 (*) 70 - 99 mg/dL   GLUCOSE, CAPILLARY     Status: Abnormal   Collection Time   06/26/12  4:29 PM      Component Value Range Comment   Glucose-Capillary 113 (*) 70 - 99 mg/dL   GLUCOSE, CAPILLARY     Status: Abnormal   Collection Time   06/26/12  9:48 PM      Component Value Range Comment   Glucose-Capillary 114 (*) 70 - 99 mg/dL    Comment 1 Notify RN     CBC     Status: Abnormal   Collection Time   06/27/12  6:22 AM      Component Value Range Comment   WBC 5.3  4.0 - 10.5 K/uL    RBC 2.77 (*) 3.87 - 5.11 MIL/uL    Hemoglobin 7.8 (*) 12.0 - 15.0  g/dL    HCT 29.5 (*) 62.1 - 46.0 %    MCV 87.7  78.0 - 100.0 fL    MCH 28.2  26.0 - 34.0 pg    MCHC 32.1  30.0 - 36.0 g/dL    RDW 30.8 (*) 65.7 - 15.5 %    Platelets 237  150 - 400 K/uL   GLUCOSE, CAPILLARY     Status: Abnormal   Collection Time   06/27/12  7:34 AM      Component Value Range Comment   Glucose-Capillary 108 (*) 70 - 99 mg/dL    Comment 1 Notify RN     GLUCOSE, CAPILLARY     Status: Abnormal   Collection Time   06/27/12 11:31 AM      Component Value Range Comment   Glucose-Capillary 149 (*) 70 - 99 mg/dL    Comment 1 Notify RN     GLUCOSE, CAPILLARY     Status: Abnormal   Collection Time   06/27/12  5:02 PM      Component Value Range Comment   Glucose-Capillary 134 (*) 70 - 99 mg/dL   GLUCOSE, CAPILLARY     Status: Abnormal   Collection Time   06/27/12 10:05 PM      Component Value Range Comment   Glucose-Capillary 136 (*) 70 - 99 mg/dL   PROTIME-INR     Status: Abnormal   Collection Time   06/28/12  5:35 AM      Component Value Range Comment   Prothrombin Time 22.6 (*) 11.6 - 15.2  seconds    INR 2.09 (*) 0.00 - 1.49      HEENT: normal Cardio: RRR and IRIR, posititive murmur Resp: CTA B/L GI: BS positive Extremity:  No Edema Skin:   Intact and Other Dry skin on RLE.  No breakdown Neuro: Alert/Oriented and Normal Sensory Musc/Skel:  Normal and Other L-BKA with compressive dressing. Good rom except lacking a few degrees in extension at the left knee Gen:  NAD   Assessment/Plan: 1. Functional deficits secondary to L BKA which require 3+ hours per day of interdisciplinary therapy in a comprehensive inpatient rehab setting. Physiatrist is providing close team supervision and 24 hour management of active medical problems listed below. Physiatrist and rehab team continue to assess barriers to discharge/monitor patient progress toward functional and medical goals.  Should be a good prosthetic candidate. Will remove wrap over the next few days. FIM: FIM - Bathing Bathing Steps Patient Completed: Chest;Right Arm;Left Arm;Front perineal area;Abdomen;Right upper leg;Left upper leg;Right lower leg (including foot) Bathing: 4: Min-Patient completes 8-9 4f 10 parts or 75+ percent  FIM - Upper Body Dressing/Undressing Upper body dressing/undressing steps patient completed: Thread/unthread left sleeve of pullover shirt/dress;Thread/unthread right sleeve of pullover shirt/dresss;Put head through opening of pull over shirt/dress;Pull shirt over trunk Upper body dressing/undressing: 5: Set-up assist to: Obtain clothing/put away FIM - Lower Body Dressing/Undressing Lower body dressing/undressing steps patient completed: Thread/unthread right underwear leg;Thread/unthread left underwear leg;Pull underwear up/down;Thread/unthread right pants leg;Thread/unthread left pants leg;Pull pants up/down;Don/Doff right sock Lower body dressing/undressing: 4: Min-Patient completed 75 plus % of tasks (min for shoe and during standing for balance)  FIM - Toileting Toileting steps completed by  patient: Performs perineal hygiene Toileting: 2: Max-Patient completed 1 of 3 steps  FIM - Diplomatic Services operational officer Devices: Bedside commode Toilet Transfers: 3-To toilet/BSC: Mod A (lift or lower assist);3-From toilet/BSC: Mod A (lift or lower assist)  FIM - Bed/Chair Transfer Bed/Chair Transfer Assistive Devices: Arm rests  Bed/Chair Transfer: 5: Sit > Supine: Supervision (verbal cues/safety issues);4: Chair or W/C > Bed: Min A (steadying Pt. > 75%)  FIM - Locomotion: Wheelchair Distance: 100 Locomotion: Wheelchair: 4: Travels 150 ft or more: maneuvers on rugs and over door sillls with minimal assistance (Pt.>75%) FIM - Locomotion: Ambulation Locomotion: Ambulation Assistive Devices: Designer, industrial/product Ambulation/Gait Assistance: 3: Mod assist Locomotion: Ambulation: 1: Travels less than 50 ft with moderate assistance (Pt: 50 - 74%)  Comprehension Comprehension Mode: Auditory Comprehension: 6-Follows complex conversation/direction: With extra time/assistive device  Expression Expression Mode: Verbal Expression: 6-Expresses complex ideas: With extra time/assistive device  Social Interaction Social Interaction: 6-Interacts appropriately with others with medication or extra time (anti-anxiety, antidepressant).  Problem Solving Problem Solving: 6-Solves complex problems: With extra time  Memory Memory: 6-More than reasonable amt of time  Medical Problem List and Plan:  1. DVT Prophylaxis/Anticoagulation: Pharmaceutical: Coumadin  2. Pain Management: prn medications effective.   -increased gabapentin for phantom limb pain. Be careful not to oversedate 3. Mood: Has a good outlook overall. LCSW to follow up for formal evaluation.  4. Neuropsych: This patient is capable of making decisions on his/her own behalf.  5. DM type 2: monitor with AC/HS CBG checks. Continue metformin and amaryl daily. Use SSI for elevated BS. No hypoglycemia in last 24 hours. 6. Atrial  Fibrillation: Monitor HR with bid checks. Needs to have HR checked apically. Continue coumadin.  7. COPD: continue spirivia for symptom management  8. HTN: Will monitor with bid checks.   Continue HCTZ, lasix and lisinopril.  Labs on 11/12 without signs of renal insufficiency.  9. ABLA:  Iron supplement added.   hgb 7.8 continue to monitor   LOS (Days) 4 A FACE TO FACE EVALUATION WAS PERFORMED  Aleksa Catterton T 06/28/2012, 7:22 AM

## 2012-06-28 NOTE — Progress Notes (Signed)
Occupational Therapy Session Notes  Patient Details  Name: Sydney [name] MRN: [id number] Date of Birth: March 27, 1921  Today's Date: 06/28/2012  Short Term Goals: Week 1:  OT Short Term Goal 1 (Week 1): Patient will bathe 9/9 body parts with min/steady assist OT Short Term Goal 2 (Week 1): Education and practice regarding lateral leans will be addressed to help increase independence with BADLs OT Short Term Goal 3 (Week 1): Patient will perform toileting tasks with moderate assistance (2/3 steps) OT Short Term Goal 4 (Week 1): Patient willl perform sit-> stands with min assist to help increase independence with BADLs  Skilled Therapeutic Interventions/Progress Updates:   Session #1 0900-1000 - 60 Minutes Individual Therapy No complaints of pain except for phantom pain in middle of session, patient pre-medicated.  Patient found seated edge of bed. Patient transferred edge of bed -> w/c for ADL retraining at sink level. Focused skilled intervention on sit/stands, w/c management, w/c maneuvering around/throughout room, UB/LB bathing & dressing, dynamic standing balance/tolerance/endurance, lateral leans, grooming tasks seated at sink, and overall activity tolerance/endurance. Therapist donned elastic shoe lace -> left shoe to help increase independence with donning shoe(eliminated tying of shoe). After ADL patient propelled self from room -> therapy gym for next therapy session.   Session #2 [id number] - 45 Minutes Individual Therapy No complaints of pain Patient found on therapy mat in therapy gym. Engaged in bed mobility to sit edge of mat and transfer into w/c. Discussed toilet transfers on/off BSC at home and recommend patient use a drop arm BSC. Then engaged in shower transfers using tub transfer bench in a simulated walk-in shower stall. Recommend patient use a tub transfer bench for home showers in her walk-in shower. Patient has a curtain instead of a door and will benefit from using a bench  rather than a seat. Patient propelled self back room at end of session and therapist left patient seated in w/c with call bell & phone within reach.   Precautions:  Precautions Precautions: Fall Restrictions Weight Bearing Restrictions: Yes LLE Weight Bearing: Non weight bearing  See FIM for current functional status  Abdulla Pooley 06/28/2012, 9:13 AM

## 2012-06-29 ENCOUNTER — Inpatient Hospital Stay (HOSPITAL_COMMUNITY): Payer: Medicare Other | Admitting: *Deleted

## 2012-06-29 DIAGNOSIS — I739 Peripheral vascular disease, unspecified: Secondary | ICD-10-CM

## 2012-06-29 DIAGNOSIS — S88119A Complete traumatic amputation at level between knee and ankle, unspecified lower leg, initial encounter: Secondary | ICD-10-CM

## 2012-06-29 DIAGNOSIS — L98499 Non-pressure chronic ulcer of skin of other sites with unspecified severity: Secondary | ICD-10-CM

## 2012-06-29 LAB — GLUCOSE, CAPILLARY
Glucose-Capillary: 165 mg/dL — ABNORMAL HIGH (ref 70–99)
Glucose-Capillary: 88 mg/dL (ref 70–99)

## 2012-06-29 MED ORDER — GABAPENTIN 400 MG PO CAPS
400.0000 mg | ORAL_CAPSULE | Freq: Three times a day (TID) | ORAL | Status: DC
  Administered 2012-06-29 – 2012-06-30 (×3): 400 mg via ORAL
  Filled 2012-06-29 (×6): qty 1

## 2012-06-29 NOTE — Progress Notes (Signed)
Patient ID: Sydney [name], female   DOB: October 29, 1920, 78 y.o.   MRN: [id number] Sydney [name] is a 78 y.o. right-handed female with history of atrial fibrillation on chronic Coumadin therapy, COPD, diastolic congestive heart failure and peripheral vascular disease. Patient was admitted 06/21/2012 with gangrenous left and chronic osteomyelitis. Recent left foot first ray amputation 05/31/2012 and discharge to home 06/01/2012. Noted progressive necrotic gangrenous changes with exposed bone foul-smelling odor. Patient underwent left below knee amputation 06/21/2012 per Dr. Lajoyce Corners. Post op chronic coumadin resumed.  Subjective/Complaints:  Phantom pain bad last noc before bedtime   Review of Systems  Musculoskeletal: Positive for joint pain.  Neurological: Positive for sensory change.  All other systems reviewed and are negative.     Objective: Vital Signs: Blood pressure 116/70, pulse 61, temperature 97.4 F (36.3 C), temperature source Oral, resp. rate 18, weight 59.4 kg (130 lb 15.3 oz), SpO2 98.00%. No results found. Results for orders placed during the hospital encounter of 06/24/12 (from the past 72 hour(s))  GLUCOSE, CAPILLARY     Status: Abnormal   Collection Time   06/26/12 11:37 AM      Component Value Range Comment   Glucose-Capillary 147 (*) 70 - 99 mg/dL   GLUCOSE, CAPILLARY     Status: Abnormal   Collection Time   06/26/12  4:29 PM      Component Value Range Comment   Glucose-Capillary 113 (*) 70 - 99 mg/dL   GLUCOSE, CAPILLARY     Status: Abnormal   Collection Time   06/26/12  9:48 PM      Component Value Range Comment   Glucose-Capillary 114 (*) 70 - 99 mg/dL    Comment 1 Notify RN     CBC     Status: Abnormal   Collection Time   06/27/12  6:22 AM      Component Value Range Comment   WBC 5.3  4.0 - 10.5 K/uL    RBC 2.77 (*) 3.87 - 5.11 MIL/uL    Hemoglobin 7.8 (*) 12.0 - 15.0 g/dL    HCT 14.7 (*) 82.9 - 46.0 %    MCV 87.7  78.0 - 100.0 fL    MCH 28.2  26.0 - 34.0 pg     MCHC 32.1  30.0 - 36.0 g/dL    RDW 56.2 (*) 13.0 - 15.5 %    Platelets 237  150 - 400 K/uL   GLUCOSE, CAPILLARY     Status: Abnormal   Collection Time   06/27/12  7:34 AM      Component Value Range Comment   Glucose-Capillary 108 (*) 70 - 99 mg/dL    Comment 1 Notify RN     GLUCOSE, CAPILLARY     Status: Abnormal   Collection Time   06/27/12 11:31 AM      Component Value Range Comment   Glucose-Capillary 149 (*) 70 - 99 mg/dL    Comment 1 Notify RN     GLUCOSE, CAPILLARY     Status: Abnormal   Collection Time   06/27/12  5:02 PM      Component Value Range Comment   Glucose-Capillary 134 (*) 70 - 99 mg/dL   GLUCOSE, CAPILLARY     Status: Abnormal   Collection Time   06/27/12 10:05 PM      Component Value Range Comment   Glucose-Capillary 136 (*) 70 - 99 mg/dL   PROTIME-INR     Status: Abnormal   Collection Time   06/28/12  5:35  AM      Component Value Range Comment   Prothrombin Time 22.6 (*) 11.6 - 15.2 seconds    INR 2.09 (*) 0.00 - 1.49   GLUCOSE, CAPILLARY     Status: Abnormal   Collection Time   06/28/12  7:33 AM      Component Value Range Comment   Glucose-Capillary 116 (*) 70 - 99 mg/dL   GLUCOSE, CAPILLARY     Status: Abnormal   Collection Time   06/28/12 11:26 AM      Component Value Range Comment   Glucose-Capillary 100 (*) 70 - 99 mg/dL   GLUCOSE, CAPILLARY     Status: Normal   Collection Time   06/28/12  4:32 PM      Component Value Range Comment   Glucose-Capillary 97  70 - 99 mg/dL   GLUCOSE, CAPILLARY     Status: Abnormal   Collection Time   06/28/12  9:17 PM      Component Value Range Comment   Glucose-Capillary 107 (*) 70 - 99 mg/dL   GLUCOSE, CAPILLARY     Status: Normal   Collection Time   06/29/12  7:37 AM      Component Value Range Comment   Glucose-Capillary 92  70 - 99 mg/dL      HEENT: normal Cardio: RRR and IRIR, posititive murmur Resp: CTA B/L GI: BS positive Extremity:  No Edema Skin:   Intact and Other Dry skin on RLE.  No  breakdown Neuro: Alert/Oriented and Normal Sensory Musc/Skel:  Normal and Other L-BKA with compressive dressing. Good rom except lacking a few degrees in extension at the left knee Gen:  NAD   Assessment/Plan: 1. Functional deficits secondary to L BKA which require 3+ hours per day of interdisciplinary therapy in a comprehensive inpatient rehab setting. Physiatrist is providing close team supervision and 24 hour management of active medical problems listed below. Physiatrist and rehab team continue to assess barriers to discharge/monitor patient progress toward functional and medical goals.  Should be a good prosthetic candidate. Will remove wrap over the next few days. FIM: FIM - Bathing Bathing Steps Patient Completed: Chest;Right Arm;Left Arm;Abdomen;Front perineal area;Right upper leg;Buttocks;Left upper leg;Right lower leg (including foot) Bathing: 5: Supervision: Safety issues/verbal cues (lateral leans for peri care)  FIM - Upper Body Dressing/Undressing Upper body dressing/undressing steps patient completed: Thread/unthread right sleeve of pullover shirt/dresss;Thread/unthread left sleeve of pullover shirt/dress;Put head through opening of pull over shirt/dress;Pull shirt over trunk Upper body dressing/undressing: 5: Set-up assist to: Obtain clothing/put away FIM - Lower Body Dressing/Undressing Lower body dressing/undressing steps patient completed: Thread/unthread right underwear leg;Thread/unthread left underwear leg;Pull underwear up/down;Thread/unthread right pants leg;Thread/unthread left pants leg;Pull pants up/down;Don/Doff left sock;Fasten/unfasten left shoe Lower body dressing/undressing: 4: Steadying Assist  FIM - Toileting Toileting steps completed by patient: Performs perineal hygiene Toileting: 0: Activity did not occur  FIM - Diplomatic Services operational officer Devices: Psychiatrist Transfers: 0-Activity did not occur  FIM - Physiological scientist Devices: Arm rests Bed/Chair Transfer: 4: Bed > Chair or W/C: Min A (steadying Pt. > 75%)  FIM - Locomotion: Wheelchair Distance: 100 Locomotion: Wheelchair: 2: Travels 50 - 149 ft with supervision, cueing or coaxing FIM - Locomotion: Ambulation Locomotion: Ambulation Assistive Devices: Designer, industrial/product Ambulation/Gait Assistance: 3: Mod assist Locomotion: Ambulation: 1: Travels less than 50 ft with moderate assistance (Pt: 50 - 74%)  Comprehension Comprehension Mode: Auditory Comprehension: 6-Follows complex conversation/direction: With extra time/assistive device  Expression Expression Mode:  Verbal Expression: 6-Expresses complex ideas: With extra time/assistive device  Social Interaction Social Interaction: 6-Interacts appropriately with others with medication or extra time (anti-anxiety, antidepressant).  Problem Solving Problem Solving: 6-Solves complex problems: With extra time  Memory Memory: 6-More than reasonable amt of time  Medical Problem List and Plan:  1. DVT Prophylaxis/Anticoagulation: Pharmaceutical: Coumadin  2. Pain Management: prn medications effective.   -increase gabapentin to 400mg  for phantom limb pain. Be careful not to oversedate 3. Mood: Has a good outlook overall. LCSW to follow up for formal evaluation.  4. Neuropsych: This patient is capable of making decisions on his/her own behalf.  5. DM type 2: monitor with AC/HS CBG checks. Continue metformin and amaryl daily. Use SSI for elevated BS. No hypoglycemia in last 24 hours. 6. Atrial Fibrillation: Monitor HR with bid checks. Needs to have HR checked apically. Continue coumadin.  7. COPD: continue spirivia for symptom management  8. HTN: Will monitor with bid checks.   Continue HCTZ, lasix and lisinopril.  Labs on 11/12 without signs of renal insufficiency.  9. ABLA:  Iron supplement added.   hgb 7.8 continue to monitor   LOS (Days) 5 A FACE TO FACE  EVALUATION WAS PERFORMED  Jadyn Brasher E 06/29/2012, 9:32 AM

## 2012-06-29 NOTE — Progress Notes (Signed)
Occupational Therapy Note  Patient Details  Name: Sydney [name] MRN: [id number] Date of Birth: 07/23/1921 Today's Date: 06/29/2012 Individual therapy Time:  4098-1191  ( ) Pain:  8/10 with phantom pain\  Pt lying in bed upon OT arrival.  She transferred to wc with verbal instruction and propelled wc to gym.  Allowed pt increased time to try to set up wc for transfer.  She required moderate instructional cues to do.  Transferred to mat to perform pelvic scooting laterally and posterior.  Did pelvic stretching.  Pt unable to get hips up against the wall, lacking about 8 inches.  Completed exercises and transferred back to wc with moderate instructional cues.  Pt. Propelled wc back to room.    Humberto Seals 06/29/2012, 5:12 PM

## 2012-06-30 ENCOUNTER — Inpatient Hospital Stay (HOSPITAL_COMMUNITY): Payer: Medicare Other | Admitting: *Deleted

## 2012-06-30 ENCOUNTER — Inpatient Hospital Stay (HOSPITAL_COMMUNITY): Payer: Medicare Other | Admitting: Occupational Therapy

## 2012-06-30 LAB — GLUCOSE, CAPILLARY
Glucose-Capillary: 112 mg/dL — ABNORMAL HIGH (ref 70–99)
Glucose-Capillary: 148 mg/dL — ABNORMAL HIGH (ref 70–99)
Glucose-Capillary: 119 mg/dL — ABNORMAL HIGH (ref 70–99)
Glucose-Capillary: 125 mg/dL — ABNORMAL HIGH (ref 70–99)

## 2012-06-30 MED ORDER — GABAPENTIN 400 MG PO CAPS
800.0000 mg | ORAL_CAPSULE | Freq: Every day | ORAL | Status: DC
  Administered 2012-06-30 – 2012-07-04 (×5): 800 mg via ORAL
  Filled 2012-06-30 (×6): qty 2

## 2012-06-30 NOTE — Progress Notes (Signed)
Patient ID: Sydney [name], female   DOB: May 30, 1921, 78 y.o.   MRN: [id number] Sydney [name] is a 78 y.o. right-handed female with history of atrial fibrillation on chronic Coumadin therapy, COPD, diastolic congestive heart failure and peripheral vascular disease. Patient was admitted 06/21/2012 with gangrenous left and chronic osteomyelitis. Recent left foot first ray amputation 05/31/2012 and discharge to home 06/01/2012. Noted progressive necrotic gangrenous changes with exposed bone foul-smelling odor. Patient underwent left below knee amputation 06/21/2012 per Dr. Lajoyce Corners. Post op chronic coumadin resumed.  Subjective/Complaints:  Phantom pain bad last noc kept her awake Review of Systems  Musculoskeletal: Positive for joint pain.  Neurological: Positive for sensory change.  All other systems reviewed and are negative.     Objective: Vital Signs: Blood pressure 151/73, pulse 67, temperature 97.6 F (36.4 C), temperature source Oral, resp. rate 20, weight 59.4 kg (130 lb 15.3 oz), SpO2 97.00%. No results found. Results for orders placed during the hospital encounter of 06/24/12 (from the past 72 hour(s))  GLUCOSE, CAPILLARY     Status: Abnormal   Collection Time   06/27/12 11:31 AM      Component Value Range Comment   Glucose-Capillary 149 (*) 70 - 99 mg/dL    Comment 1 Notify RN     GLUCOSE, CAPILLARY     Status: Abnormal   Collection Time   06/27/12  5:02 PM      Component Value Range Comment   Glucose-Capillary 134 (*) 70 - 99 mg/dL   GLUCOSE, CAPILLARY     Status: Abnormal   Collection Time   06/27/12 10:05 PM      Component Value Range Comment   Glucose-Capillary 136 (*) 70 - 99 mg/dL   PROTIME-INR     Status: Abnormal   Collection Time   06/28/12  5:35 AM      Component Value Range Comment   Prothrombin Time 22.6 (*) 11.6 - 15.2 seconds    INR 2.09 (*) 0.00 - 1.49   GLUCOSE, CAPILLARY     Status: Abnormal   Collection Time   06/28/12  7:33 AM      Component Value Range  Comment   Glucose-Capillary 116 (*) 70 - 99 mg/dL   GLUCOSE, CAPILLARY     Status: Abnormal   Collection Time   06/28/12 11:26 AM      Component Value Range Comment   Glucose-Capillary 100 (*) 70 - 99 mg/dL   GLUCOSE, CAPILLARY     Status: Normal   Collection Time   06/28/12  4:32 PM      Component Value Range Comment   Glucose-Capillary 97  70 - 99 mg/dL   GLUCOSE, CAPILLARY     Status: Abnormal   Collection Time   06/28/12  9:17 PM      Component Value Range Comment   Glucose-Capillary 107 (*) 70 - 99 mg/dL   GLUCOSE, CAPILLARY     Status: Normal   Collection Time   06/29/12  7:37 AM      Component Value Range Comment   Glucose-Capillary 92  70 - 99 mg/dL   GLUCOSE, CAPILLARY     Status: Abnormal   Collection Time   06/29/12 11:44 AM      Component Value Range Comment   Glucose-Capillary 165 (*) 70 - 99 mg/dL   GLUCOSE, CAPILLARY     Status: Normal   Collection Time   06/29/12  4:35 PM      Component Value Range Comment   Glucose-Capillary  88  70 - 99 mg/dL   GLUCOSE, CAPILLARY     Status: Abnormal   Collection Time   06/29/12  9:06 PM      Component Value Range Comment   Glucose-Capillary 154 (*) 70 - 99 mg/dL    Comment 1 Notify RN     GLUCOSE, CAPILLARY     Status: Abnormal   Collection Time   06/30/12  7:30 AM      Component Value Range Comment   Glucose-Capillary 112 (*) 70 - 99 mg/dL      HEENT: normal Cardio: RRR and IRIR, posititive murmur Resp: CTA B/L GI: BS positive Extremity:  No Edema Skin:   Intact and Other Dry skin on RLE.  No breakdown Neuro: Alert/Oriented and Normal Sensory Musc/Skel:  Normal and Other L-BKA with compressive dressing. Good rom except lacking a few degrees in extension at the left knee Gen:  NAD   Assessment/Plan: 1. Functional deficits secondary to L BKA which require 3+ hours per day of interdisciplinary therapy in a comprehensive inpatient rehab setting. Physiatrist is providing close team supervision and 24 hour  management of active medical problems listed below. Physiatrist and rehab team continue to assess barriers to discharge/monitor patient progress toward functional and medical goals.  Should be a good prosthetic candidate. Will remove wrap over the next few days. FIM: FIM - Bathing Bathing Steps Patient Completed: Chest;Right Arm;Left Arm;Abdomen;Front perineal area;Buttocks;Left upper leg;Right lower leg (including foot) Bathing: 5: Supervision: Safety issues/verbal cues  FIM - Upper Body Dressing/Undressing Upper body dressing/undressing steps patient completed: Thread/unthread right sleeve of pullover shirt/dresss;Thread/unthread left sleeve of pullover shirt/dress;Put head through opening of pull over shirt/dress;Pull shirt over trunk Upper body dressing/undressing: 5: Set-up assist to: Obtain clothing/put away FIM - Lower Body Dressing/Undressing Lower body dressing/undressing steps patient completed: Thread/unthread right underwear leg;Thread/unthread left underwear leg;Pull underwear up/down;Thread/unthread right pants leg;Thread/unthread left pants leg;Pull pants up/down;Don/Doff right sock;Don/Doff left sock Lower body dressing/undressing: 4: Steadying Assist  FIM - Toileting Toileting steps completed by patient: Performs perineal hygiene Toileting: 0: Activity did not occur  FIM - Diplomatic Services operational officer Devices: Psychiatrist Transfers: 0-Activity did not occur  FIM - Banker Devices: Arm rests Bed/Chair Transfer: 4: Bed > Chair or W/C: Min A (steadying Pt. > 75%)  FIM - Locomotion: Wheelchair Distance: 100 Locomotion: Wheelchair: 2: Travels 50 - 149 ft with supervision, cueing or coaxing FIM - Locomotion: Ambulation Locomotion: Ambulation Assistive Devices: Designer, industrial/product Ambulation/Gait Assistance: 3: Mod assist Locomotion: Ambulation: 1: Travels less than 50 ft with moderate assistance (Pt: 50 -  74%)  Comprehension Comprehension Mode: Auditory Comprehension: 6-Follows complex conversation/direction: With extra time/assistive device  Expression Expression Mode: Verbal Expression: 6-Expresses complex ideas: With extra time/assistive device  Social Interaction Social Interaction: 6-Interacts appropriately with others with medication or extra time (anti-anxiety, antidepressant).  Problem Solving Problem Solving: 6-Solves complex problems: With extra time  Memory Memory: 6-More than reasonable amt of time  Medical Problem List and Plan:  1. DVT Prophylaxis/Anticoagulation: Pharmaceutical: Coumadin  2. Pain Management: prn medications effective.   -increase gabapentin to 400mg  TID for phantom limb pain. Not helpful will change dosing to BID with 400mg  in am and 800mg  qhs 3. Mood: Has a good outlook overall. LCSW to follow up for formal evaluation.  4. Neuropsych: This patient is capable of making decisions on his/her own behalf.  5. DM type 2: monitor with AC/HS CBG checks. Continue metformin and amaryl daily. Use SSI  for elevated BS. No hypoglycemia in last 24 hours. 6. Atrial Fibrillation: Monitor HR with bid checks. Needs to have HR checked apically. Continue coumadin.  7. COPD: continue spirivia for symptom management  8. HTN: Will monitor with bid checks.   Continue HCTZ, lasix and lisinopril.  Labs on 11/12 without signs of renal insufficiency.  9. ABLA:  Iron supplement added.   hgb 7.8 continue to monitor   LOS (Days) 6 A FACE TO FACE EVALUATION WAS PERFORMED  Matheus Spiker E 06/30/2012, 8:49 AM

## 2012-06-30 NOTE — Progress Notes (Signed)
Occupational Therapy Session Notes  Patient Details  Name: Sydney [name] MRN: [id number] Date of Birth: 29-Jun-1921  Today's Date: 06/30/2012 Time: 0800-0855 and 345-410 Time Calculation (min): 55 min and 25 min  Short Term Goals: Week 1:  OT Short Term Goal 1 (Week 1): Patient will bathe 9/9 body parts with min/steady assist OT Short Term Goal 2 (Week 1): Education and practice regarding lateral leans will be addressed to help increase independence with BADLs OT Short Term Goal 3 (Week 1): Patient will perform toileting tasks with moderate assistance (2/3 steps) OT Short Term Goal 4 (Week 1): Patient willl perform sit-> stands with min assist to help increase independence with BADLs  Skilled Therapeutic Interventions/Progress Updates:  1)  Self care retraining to include toilet transfer and toileting, sponge bath at sink and dressing.  Focus session on safe squat/scoot pivot transfers, activity tolerance, sit><stands, standing tolerance and standing balance.  Patient declined to stand for groom tasks yet agreed to try to in the next day or so.  2)  Patient in w/c upon arrival and declined toilet transfer stating that someone had already taken her.  Patient stating that she was vary tired and wanted assistance to get back to bed.  Provided patient with drop arm commode secondary to former drop arm was removed earlier in the day and replaced by one that did not have drop arms.   Therapy Documentation Precautions:  Precautions Precautions: Fall Restrictions Weight Bearing Restrictions: Yes LLE Weight Bearing: Non weight bearing Pain: AM: 2 episodes of phantom pain, PM: 1 episode of phantom pain.  ADL:  See FIM for current functional status  Therapy/Group: Individual Therapy  Alwin Lanigan 06/30/2012, 8:46 AM

## 2012-06-30 NOTE — Progress Notes (Signed)
Physical Therapy Session Note  Patient Details  Name: Sydney [name] MRN: [id number] Date of Birth: June 26, 1921  Today's Date: 06/30/2012 Time:  14:05-14:35 ( )  Short Term Goals: Week 1:  PT Short Term Goal 1 (Week 1): Pt will perform sit <> stand from variety of surfaces with min assist, min verbal cues PT Short Term Goal 2 (Week 1): Pt will perform stand pivot transfers with min assist PT Short Term Goal 3 (Week 1): Pt will propel wheelchair x 100' over controlled environment with supervision PT Short Term Goal 4 (Week 1): Pt will ambulate x 15' with RW, min assist PT Short Term Goal 5 (Week 1): Pt will report complience with performing HEP 75% of days outside of scheduled therapy time  Skilled Therapeutic Interventions/Progress Updates:  No complaints of pain.  Tx focused on therex for strengthening and activity tolerance.  Sit<>stand in // bars with Min A for lifting and cues for hand placement.  Pt stood 1x3. and 1x67min and performed following with rest between:  R hip ABD, R Hip Ext, R HS curls, L mini-squats all x15 Seated therex: bil marching and LAQ x15 each   Gait training 1x6' hopping in bars with Mod A and cues for L foot clearance, but pt unable to fully extend on elbows due to weakness.      See FIM for current functional status  Therapy/Group: Individual Therapy  Virl Cagey 06/30/2012, 2:33 PM

## 2012-06-30 NOTE — Progress Notes (Addendum)
Physical Therapy Session Note  Patient Details  Name: Sydney [name] MRN: [id number] Date of Birth: [birth year]  Today's Date: 06/30/2012 Time: 9:00-10:00 ( )   Short Term Goals: Week 1:  PT Short Term Goal 1 (Week 1): Pt will perform sit <> stand from variety of surfaces with min assist, min verbal cues PT Short Term Goal 2 (Week 1): Pt will perform stand pivot transfers with min assist PT Short Term Goal 3 (Week 1): Pt will propel wheelchair x 100' over controlled environment with supervision PT Short Term Goal 4 (Week 1): Pt will ambulate x 15' with RW, min assist PT Short Term Goal 5 (Week 1): Pt will report complience with performing HEP 75% of days outside of scheduled therapy time  Skilled Therapeutic Interventions/Progress Updates:  Tx focused on WC mobility and parts management, transfer training, and therex for LE strength and ROM.  Pt c/o intermittent neuropathic pain during tx, but quickly passed. Reinforced all education on RLE care, positioning, safety, and pre-prosthetic training.  Pt in WC upon arrival, still needing hand-over-hand assist for parts management. Pt able to propel WC 200' with Min A for turning and increased time required.  Scoot transfers R/L to level surfaces with min-guard only and cues for hand placement and technique.   Sit<>supine with S only.  Seated LAQ  On L x10 with 5 sec holds.  Supine glute sets, quad sets, SLR, SAQ all x10 Sidelying L hip Ext and hip Abd with cues for form x10 Prone passive stretch for hip and knee ext x1 min, hip ext in minimal range x10 (impresive!!)   Static standing with bil UE support in RW x .  Sit<>stand with Min A and cues for hand placement. Min A for steadying needed to prevent posterior LOB.  Stand-pivot transfer with Min A and RW, pt unable to clear RLE, scooting pivot only.         Therapy Documentation Precautions:  Precautions Precautions: Fall Restrictions Weight Bearing Restrictions: Yes LLE  Weight Bearing: Non weight bearing Pain: Pain Assessment Pain Assessment: No/denies pain   Locomotion : Wheelchair Mobility Distance: 200   See FIM for current functional status  Therapy/Group: Individual Therapy  Virl Cagey, PT 06/30/2012, 9:34 AM

## 2012-07-01 ENCOUNTER — Inpatient Hospital Stay (HOSPITAL_COMMUNITY): Payer: Medicare Other | Admitting: Physical Therapy

## 2012-07-01 ENCOUNTER — Inpatient Hospital Stay (HOSPITAL_COMMUNITY): Payer: Medicare Other | Admitting: Occupational Therapy

## 2012-07-01 ENCOUNTER — Inpatient Hospital Stay (HOSPITAL_COMMUNITY): Payer: Medicare Other

## 2012-07-01 DIAGNOSIS — I739 Peripheral vascular disease, unspecified: Secondary | ICD-10-CM

## 2012-07-01 DIAGNOSIS — L98499 Non-pressure chronic ulcer of skin of other sites with unspecified severity: Secondary | ICD-10-CM

## 2012-07-01 DIAGNOSIS — S88119A Complete traumatic amputation at level between knee and ankle, unspecified lower leg, initial encounter: Secondary | ICD-10-CM

## 2012-07-01 LAB — GLUCOSE, CAPILLARY
Glucose-Capillary: 167 mg/dL — ABNORMAL HIGH (ref 70–99)
Glucose-Capillary: 107 mg/dL — ABNORMAL HIGH (ref 70–99)

## 2012-07-01 LAB — PROTIME-INR: INR: 1.87 — ABNORMAL HIGH (ref 0.00–1.49)

## 2012-07-01 MED ORDER — GABAPENTIN 400 MG PO CAPS
400.0000 mg | ORAL_CAPSULE | Freq: Every morning | ORAL | Status: DC
  Administered 2012-07-01 – 2012-07-05 (×5): 400 mg via ORAL
  Filled 2012-07-01 (×6): qty 1

## 2012-07-01 MED ORDER — WARFARIN SODIUM 4 MG PO TABS
4.0000 mg | ORAL_TABLET | Freq: Once | ORAL | Status: AC
  Administered 2012-07-01: 4 mg via ORAL
  Filled 2012-07-01: qty 1

## 2012-07-01 NOTE — Progress Notes (Signed)
Recreational Therapy Session Note  Patient Details  Name: Sydney [name] MRN: [id number] Date of Birth: [birth year] Today's Date: 07/01/2012 Time:[phone] Pain: intermittent phantom pain, premedicated  Skilled Therapeutic Interventions/Progress Updates: Pt stood with 1 UE support for horseshoe activity reaching outside BOS with min-mod assist working on sit-->stands, dynamic standing balance, & safety  Therapy/Group: Co-Treatment   Angellynn Kimberlin 07/01/2012, 4:02 PM

## 2012-07-01 NOTE — Progress Notes (Signed)
Occupational Therapy Session Notes  Patient Details  Name: BRANAE CRAIL MRN: [id number] Date of Birth: [birth year]  Today's Date: 07/01/2012  Short Term Goals: Week 1:  OT Short Term Goal 1 (Week 1): Patient will bathe 9/9 body parts with min/steady assist OT Short Term Goal 2 (Week 1): Education and practice regarding lateral leans will be addressed to help increase independence with BADLs OT Short Term Goal 3 (Week 1): Patient will perform toileting tasks with moderate assistance (2/3 steps) OT Short Term Goal 4 (Week 1): Patient willl perform sit-> stands with min assist to help increase independence with BADLs  Skilled Therapeutic Interventions/Progress Updates:   Session #1 [phone] - 60 Minutes Individual Therapy Patient with complaints of phantom pain throughout session, randomly. Patient found supine in bed. Dr. removed bandage from left residual limb. Patient engaged in bed mobility to transfer edge of bed -> w/c with close supervision. Therapist then assisted patient into bathroom via w/c and patient transferred from w/c -> BSC that was set-up in shower stall. Focused skilled intervention on ADL retraining at shower level, sit/stands, lateral leans, ADL transfers, overall activity tolerance/endurance, pain management, and grooming tasks seated at sink. At end of session left patient seated in w/c with call bell & phone within reach.   Session #2 [id number] - 45 Minutes Individual Therapy No complaints of pain except random phantom pain Treatment focus on therapeutic exercises. Patient engaged in w/c propulsion/maneuvering, 3 sets of 10 reps of w/c pushups, and exercise using the SCIFIT machine on random mode & level 10. Patient fatigued & sleepy throughout session. At end of session patient transferred back to bed with close supervision. Left patient supine in bed with call bell & phone within reach.   Precautions:  Precautions Precautions: Fall Restrictions Weight Bearing  Restrictions: Yes LLE Weight Bearing: Non weight bearing  See FIM for current functional status  Charna Neeb 07/01/2012, 8:15 AM

## 2012-07-01 NOTE — Progress Notes (Signed)
Physical Therapy Session Note  Patient Details  Name: Sydney [name] MRN: [id number] Date of Birth: April 28, 1921  Today's Date: 07/01/2012 Time: 1300-1330 Time Calculation (min): 30 min  Short Term Goals: Week 1:  PT Short Term Goal 1 (Week 1): Pt will perform sit <> stand from variety of surfaces with min assist, min verbal cues PT Short Term Goal 2 (Week 1): Pt will perform stand pivot transfers with min assist PT Short Term Goal 3 (Week 1): Pt will propel wheelchair x 100' over controlled environment with supervision PT Short Term Goal 4 (Week 1): Pt will ambulate x 15' with RW, min assist PT Short Term Goal 5 (Week 1): Pt will report complience with performing HEP 75% of days outside of scheduled therapy time  Skilled Therapeutic Interventions/Progress Updates:    Pt performed w/c mobility x 150 in hospital environment with supervision and cues.  Transferred w/c to/from mat with scoot-pivot and supervision plus verbal cues.  Pt continues to need cues and sometimes assist with w/c management/components.  Sit-supine-sit with supervision.  Performed B LE strengthening and stretching exercises.  Therapy Documentation Precautions:  Precautions Precautions: Fall Restrictions Weight Bearing Restrictions: Yes LLE Weight Bearing: Non weight bearing   Pain:  Pt c/o some phantom limb pain and did note some L LE spasms with mobility causing pain during the spasm.  Premedicated prior to treatment.  See FIM for current functional status  Therapy/Group: Individual Therapy  Newell Coral 07/01/2012, 2:27 PM

## 2012-07-01 NOTE — Progress Notes (Signed)
Hypoglycemic Event  CBG: 55 at 1638  Treatment: 15 GM carbohydrate snack , ate dinner and drank 2% milk 1 carton  Symptoms: Nervous/irritable  Follow-up CBG: Time:1742 CBG Result:91  Possible Reasons for Event: Inadequate meal intake  Comments/MD notified:patient reports she did not eat 100% for lunch     Oletha Cruel S  Remember to initiate Hypoglycemia Order Set & complete

## 2012-07-01 NOTE — Progress Notes (Signed)
ANTICOAGULATION CONSULT NOTE - Follow Up Consult  Pharmacy Consult:  Coumadin Indication:  History of atrial fibrillation + VTE prophylaxis  Allergies  Allergen Reactions  . Other     Narcotics=makes her very disoriented  . Percocet (Oxycodone-Acetaminophen) Other (See Comments)    Dilerium, hallucinations  . Tramadol Anxiety  . Vicodin (Hydrocodone-Acetaminophen) Anxiety    Patient Measurements: Weight: 130 lb 15.3 oz (59.4 kg) (standing scale)  Vital Signs: Temp: 97.8 F (36.6 C) (11/18 0500) Temp src: Oral (11/18 0500) BP: 138/60 mmHg (11/18 0904) Pulse Rate: 62  (11/18 0500)  Labs:  Basename 07/01/12 0615  HGB --  HCT --  PLT --  APTT --  LABPROT 20.8*  INR 1.87*  HEPARINUNFRC --  CREATININE --  CKTOTAL --  CKMB --  TROPONINI --    The CrCl is unknown because both a height and weight (above a minimum accepted value) are required for this calculation.      Assessment: 8 YOF continues on Coumadin for history of atrial fibrillation and for VTE prophylaxis s/p left transtibial amputation.  INR decreased to subtherapeutic level today.  No bleeding reported.   Goal of Therapy:  INR 2-3 Monitor platelets by anticoagulation protocol: Yes    Plan:  - Coumadin 4mg  PO today - Daily PT / INR for now     Mayia Megill D. Laney Potash, PharmD, BCPS Pager:  [phone] 07/01/2012, 1:17 PM

## 2012-07-01 NOTE — Progress Notes (Signed)
Patient ID: Sydney [name], female   DOB: Aug 25, 1920, 78 y.o.   MRN: [id number] Sydney [name] is a 78 y.o. right-handed female with history of atrial fibrillation on chronic Coumadin therapy, COPD, diastolic congestive heart failure and peripheral vascular disease. Patient was admitted 06/21/2012 with gangrenous left and chronic osteomyelitis. Recent left foot first ray amputation 05/31/2012 and discharge to home 06/01/2012. Noted progressive necrotic gangrenous changes with exposed bone foul-smelling odor. Patient underwent left below knee amputation 06/21/2012 per Dr. Lajoyce Corners. Post op chronic coumadin resumed.  Subjective/Complaints:  Phantom pain bad last noc kept her awake Review of Systems  Musculoskeletal: Positive for joint pain.  Neurological: Positive for sensory change.  All other systems reviewed and are negative.     Objective: Vital Signs: Blood pressure 120/69, pulse 62, temperature 97.8 F (36.6 C), temperature source Oral, resp. rate 18, weight 59.4 kg (130 lb 15.3 oz), SpO2 99.00%. No results found. Results for orders placed during the hospital encounter of 06/24/12 (from the past 72 hour(s))  GLUCOSE, CAPILLARY     Status: Abnormal   Collection Time   06/28/12  7:33 AM      Component Value Range Comment   Glucose-Capillary 116 (*) 70 - 99 mg/dL   GLUCOSE, CAPILLARY     Status: Abnormal   Collection Time   06/28/12 11:26 AM      Component Value Range Comment   Glucose-Capillary 100 (*) 70 - 99 mg/dL   GLUCOSE, CAPILLARY     Status: Normal   Collection Time   06/28/12  4:32 PM      Component Value Range Comment   Glucose-Capillary 97  70 - 99 mg/dL   GLUCOSE, CAPILLARY     Status: Abnormal   Collection Time   06/28/12  9:17 PM      Component Value Range Comment   Glucose-Capillary 107 (*) 70 - 99 mg/dL   GLUCOSE, CAPILLARY     Status: Normal   Collection Time   06/29/12  7:37 AM      Component Value Range Comment   Glucose-Capillary 92  70 - 99 mg/dL   GLUCOSE,  CAPILLARY     Status: Abnormal   Collection Time   06/29/12 11:44 AM      Component Value Range Comment   Glucose-Capillary 165 (*) 70 - 99 mg/dL   GLUCOSE, CAPILLARY     Status: Normal   Collection Time   06/29/12  4:35 PM      Component Value Range Comment   Glucose-Capillary 88  70 - 99 mg/dL   GLUCOSE, CAPILLARY     Status: Abnormal   Collection Time   06/29/12  9:06 PM      Component Value Range Comment   Glucose-Capillary 154 (*) 70 - 99 mg/dL    Comment 1 Notify RN     GLUCOSE, CAPILLARY     Status: Abnormal   Collection Time   06/30/12  7:30 AM      Component Value Range Comment   Glucose-Capillary 112 (*) 70 - 99 mg/dL   GLUCOSE, CAPILLARY     Status: Abnormal   Collection Time   06/30/12 11:06 AM      Component Value Range Comment   Glucose-Capillary 148 (*) 70 - 99 mg/dL   GLUCOSE, CAPILLARY     Status: Abnormal   Collection Time   06/30/12  4:35 PM      Component Value Range Comment   Glucose-Capillary 119 (*) 70 - 99 mg/dL  GLUCOSE, CAPILLARY     Status: Abnormal   Collection Time   06/30/12  9:09 PM      Component Value Range Comment   Glucose-Capillary 125 (*) 70 - 99 mg/dL    Comment 1 Notify RN     PROTIME-INR     Status: Abnormal   Collection Time   07/01/12  6:15 AM      Component Value Range Comment   Prothrombin Time 20.8 (*) 11.6 - 15.2 seconds    INR 1.87 (*) 0.00 - 1.49      HEENT: normal Cardio: RRR and IRIR, posititive murmur Resp: CTA B/L GI: BS positive Extremity:  No Edema Skin:   Intact and Other Dry skin on RLE.  No breakdown Neuro: Alert/Oriented and Normal Sensory Musc/Skel:  Normal and Other L-BKA with compressive dressing. Good rom except lacking a few degrees in extension at the left knee Gen:  NAD   Assessment/Plan: 1. Functional deficits secondary to L BKA which require 3+ hours per day of interdisciplinary therapy in a comprehensive inpatient rehab setting. Physiatrist is providing close team supervision and 24 hour  management of active medical problems listed below. Physiatrist and rehab team continue to assess barriers to discharge/monitor patient progress toward functional and medical goals.  Should be a good prosthetic candidate. Will remove wrap today. FIM: FIM - Bathing Bathing Steps Patient Completed: Chest;Right Arm;Left Arm;Abdomen;Front perineal area;Right upper leg;Buttocks;Left upper leg;Right lower leg (including foot) Bathing: 4: Steadying assist (standing)  FIM - Upper Body Dressing/Undressing Upper body dressing/undressing steps patient completed: Thread/unthread right sleeve of pullover shirt/dresss;Thread/unthread left sleeve of pullover shirt/dress;Put head through opening of pull over shirt/dress;Pull shirt over trunk Upper body dressing/undressing: 5: Set-up assist to: Obtain clothing/put away FIM - Lower Body Dressing/Undressing Lower body dressing/undressing steps patient completed: Thread/unthread right underwear leg;Thread/unthread left underwear leg;Pull underwear up/down;Thread/unthread right pants leg;Thread/unthread left pants leg;Pull pants up/down;Don/Doff left sock;Fasten/unfasten left shoe Lower body dressing/undressing: 4: Steadying Assist  FIM - Toileting Toileting steps completed by patient: Adjust clothing prior to toileting;Performs perineal hygiene;Adjust clothing after toileting Toileting: 5: Supervision: Safety issues/verbal cues  FIM - Diplomatic Services operational officer Devices: Bedside commode Toilet Transfers: 0-Activity did not occur;4-To toilet/BSC: Min A (steadying Pt. > 75%);4-From toilet/BSC: Min A (steadying Pt. > 75%)  FIM - Bed/Chair Transfer Bed/Chair Transfer Assistive Devices: Arm rests Bed/Chair Transfer: 4: Bed > Chair or W/C: Min A (steadying Pt. > 75%);4: Chair or W/C > Bed: Min A (steadying Pt. > 75%);5: Sit > Supine: Supervision (verbal cues/safety issues)  FIM - Locomotion: Wheelchair Distance: 200 Locomotion: Wheelchair: 4:  Travels 150 ft or more: maneuvers on rugs and over door sillls with minimal assistance (Pt.>75%) FIM - Locomotion: Ambulation Locomotion: Ambulation Assistive Devices: Parallel bars Ambulation/Gait Assistance: 3: Mod assist Locomotion: Ambulation: 1: Travels less than 50 ft with moderate assistance (Pt: 50 - 74%)  Comprehension Comprehension Mode: Auditory Comprehension: 6-Follows complex conversation/direction: With extra time/assistive device  Expression Expression Mode: Verbal Expression: 6-Expresses complex ideas: With extra time/assistive device  Social Interaction Social Interaction: 6-Interacts appropriately with others with medication or extra time (anti-anxiety, antidepressant).  Problem Solving Problem Solving: 6-Solves complex problems: With extra time  Memory Memory: 6-More than reasonable amt of time  Medical Problem List and Plan:  1. DVT Prophylaxis/Anticoagulation: Pharmaceutical: Coumadin  2. Pain Management: prn medications effective.   -increased gabapentin to 400mg  TID for phantom limb pain.  Hesitate increasing this much further given likely SE 3. Mood: Has a good outlook overall. LCSW  to follow up for formal evaluation.  4. Neuropsych: This patient is capable of making decisions on his/her own behalf.  5. DM type 2: monitor with AC/HS CBG checks. Continue metformin and amaryl daily. Use SSI for elevated BS.  6. Atrial Fibrillation: Monitor HR with bid checks. Needs to have HR checked apically. Continue coumadin.  7. COPD: continue spirivia for symptom management  8. HTN: Will monitor with bid checks.   Continue HCTZ, lasix and lisinopril.  Labs on 11/12 without signs of renal insufficiency.  9. ABLA:  Iron supplement added.   hgb 7.8 continue to monitor   LOS (Days) 7 A FACE TO FACE EVALUATION WAS PERFORMED  Santhosh Gulino T 07/01/2012, 7:07 AM

## 2012-07-01 NOTE — Progress Notes (Signed)
Physical Therapy Session Note  Patient Details  Name: Sydney [name] MRN: [id number] Date of Birth: 14-Feb-1921  Today's Date: 07/01/2012 Time: 0930-1030 Time Calculation (min): 60 min  Short Term Goals: Week 1:  PT Short Term Goal 1 (Week 1): Pt will perform sit <> stand from variety of surfaces with min assist, min verbal cues PT Short Term Goal 2 (Week 1): Pt will perform stand pivot transfers with min assist PT Short Term Goal 3 (Week 1): Pt will propel wheelchair x 100' over controlled environment with supervision PT Short Term Goal 4 (Week 1): Pt will ambulate x 15' with RW, min assist PT Short Term Goal 5 (Week 1): Pt will report complience with performing HEP 75% of days outside of scheduled therapy time  Skilled Therapeutic Interventions/Progress Updates:    Treatment focused on dynamic standing balance activity with 1 UE support with Co Treat with recreational therapy for reaching outside BOS and throwing horseshoes with min/up to mod A. Initial sit to stand required mod A and cues for hand placement, successive sit to stands with min A. Scoot transfers with S and max cues for w/c parts management.  Second part of session focused on problem solving and practicing car transfer with pt and daughter. Recommend use of sedan car and slideboard for safety and energy conservation. Pt with poor foot clearance during a transfer with standing and on concrete/pavement feel that it may become unsafe/more assist needed. Pt and family in agreement with use of slideboard. Daughter return demonstrated with mod cues and will benefit from further practice.   Therapy Documentation Precautions:  Precautions Precautions: Fall Restrictions Weight Bearing Restrictions: Yes LLE Weight Bearing: Non weight bearing  Pain: C/o phantom pain - premedicated.  See FIM for current functional status  Therapy/Group: Individual Therapy  Karolee Stamps [facility] 07/01/2012, 12:15 PM

## 2012-07-02 ENCOUNTER — Inpatient Hospital Stay (HOSPITAL_COMMUNITY): Payer: Medicare Other | Admitting: *Deleted

## 2012-07-02 ENCOUNTER — Inpatient Hospital Stay (HOSPITAL_COMMUNITY): Payer: Medicare Other | Admitting: Physical Therapy

## 2012-07-02 ENCOUNTER — Inpatient Hospital Stay (HOSPITAL_COMMUNITY): Payer: Medicare Other | Admitting: Occupational Therapy

## 2012-07-02 ENCOUNTER — Encounter (HOSPITAL_COMMUNITY): Payer: Medicare Other | Admitting: *Deleted

## 2012-07-02 LAB — GLUCOSE, CAPILLARY
Glucose-Capillary: 126 mg/dL — ABNORMAL HIGH (ref 70–99)
Glucose-Capillary: 117 mg/dL — ABNORMAL HIGH (ref 70–99)

## 2012-07-02 LAB — URINE CULTURE: Colony Count: >=100000

## 2012-07-02 MED ORDER — WARFARIN SODIUM 3 MG PO TABS
3.0000 mg | ORAL_TABLET | Freq: Once | ORAL | Status: AC
  Administered 2012-07-02: 3 mg via ORAL
  Filled 2012-07-02: qty 1

## 2012-07-02 NOTE — Progress Notes (Signed)
Physical Therapy Session Note  Patient Details  Name: Sydney [name] MRN: [id number] Date of Birth: [birth year]  Today's Date: 07/02/2012 Time: 7829-5621 Time Calculation (min): 43 min  Short Term Goals: Week 2:     Skilled Therapeutic Interventions/Progress Updates: Patient had not yet showered or dressed and did not want to leave room.  Worked on LE strengthening, transfers, and standing balance this session.  Sit/stand to/from edge of bed/RW x3 with contact assist and cuing for safe hand placement.  Standing at edge of bed with RW 5 minutes with supervision for LE strengthening and endurance.  Standing therex x10:  Left hip extension, left hip abduction.  Supine therex x10; bilateral quad sets, bilateral glut sets, side-lying left hip extension, side-lying left hip abduction.  Patient independent with scooting up in bed, turning side/side in bed.  Patient performed sit/supine transfer with supervision.     Therapy Documentation Precautions:  Precautions Precautions: Fall Restrictions Weight Bearing Restrictions: Yes LLE Weight Bearing: Non weight bearing General:   Vital Signs: Oxygen Therapy SpO2: 95 % O2 Device: None (Room air) Pain: Pain Assessment Pain Assessment: 0-10 Pain Score:  (patient unable to rate) Pain Type: Phantom pain Pain Location: Leg Pain Orientation: Left Pain Descriptors: Spasm;Aching Pain Onset: Other (Comment) (fleeting) Patients Stated Pain Goal: 1 Pain Intervention(s): RN made aware  See FIM for current functional status  Therapy/Group: Individual Therapy  Rexene Agent 07/02/2012, 9:43 AM

## 2012-07-02 NOTE — Progress Notes (Signed)
Patient ID: Sydney [name], female   DOB: [birth year], 78 y.o.   MRN: [id number]    Subjective/Complaints:  Phantom pain better. Slept well.  Review of Systems  Musculoskeletal: Positive for joint pain.  Neurological: Positive for sensory change.  All other systems reviewed and are negative.     Objective: Vital Signs: Blood pressure 127/65, pulse 66, temperature 97.6 F (36.4 C), temperature source Oral, resp. rate 19, weight 59.4 kg (130 lb 15.3 oz), SpO2 97.00%. No results found. Results for orders placed during the hospital encounter of 06/24/12 (from the past 72 hour(s))  GLUCOSE, CAPILLARY     Status: Normal   Collection Time   06/29/12  7:37 AM      Component Value Range Comment   Glucose-Capillary 92  70 - 99 mg/dL   GLUCOSE, CAPILLARY     Status: Abnormal   Collection Time   06/29/12 11:44 AM      Component Value Range Comment   Glucose-Capillary 165 (*) 70 - 99 mg/dL   GLUCOSE, CAPILLARY     Status: Normal   Collection Time   06/29/12  4:35 PM      Component Value Range Comment   Glucose-Capillary 88  70 - 99 mg/dL   GLUCOSE, CAPILLARY     Status: Abnormal   Collection Time   06/29/12  9:06 PM      Component Value Range Comment   Glucose-Capillary 154 (*) 70 - 99 mg/dL    Comment 1 Notify RN     GLUCOSE, CAPILLARY     Status: Abnormal   Collection Time   06/30/12  7:30 AM      Component Value Range Comment   Glucose-Capillary 112 (*) 70 - 99 mg/dL   GLUCOSE, CAPILLARY     Status: Abnormal   Collection Time   06/30/12 11:06 AM      Component Value Range Comment   Glucose-Capillary 148 (*) 70 - 99 mg/dL   GLUCOSE, CAPILLARY     Status: Abnormal   Collection Time   06/30/12  4:35 PM      Component Value Range Comment   Glucose-Capillary 119 (*) 70 - 99 mg/dL   GLUCOSE, CAPILLARY     Status: Abnormal   Collection Time   06/30/12  9:09 PM      Component Value Range Comment   Glucose-Capillary 125 (*) 70 - 99 mg/dL    Comment 1 Notify RN     PROTIME-INR      Status: Abnormal   Collection Time   07/01/12  6:15 AM      Component Value Range Comment   Prothrombin Time 20.8 (*) 11.6 - 15.2 seconds    INR 1.87 (*) 0.00 - 1.49   GLUCOSE, CAPILLARY     Status: Abnormal   Collection Time   07/01/12  7:26 AM      Component Value Range Comment   Glucose-Capillary 109 (*) 70 - 99 mg/dL    Comment 1 Notify RN     GLUCOSE, CAPILLARY     Status: Abnormal   Collection Time   07/01/12 11:32 AM      Component Value Range Comment   Glucose-Capillary 167 (*) 70 - 99 mg/dL    Comment 1 Notify RN     GLUCOSE, CAPILLARY     Status: Abnormal   Collection Time   07/01/12  4:38 PM      Component Value Range Comment   Glucose-Capillary 55 (*) 70 - 99 mg/dL  Comment 1 Notify RN     GLUCOSE, CAPILLARY     Status: Normal   Collection Time   07/01/12  5:42 PM      Component Value Range Comment   Glucose-Capillary 91  70 - 99 mg/dL    Comment 1 Notify RN     GLUCOSE, CAPILLARY     Status: Abnormal   Collection Time   07/01/12  8:50 PM      Component Value Range Comment   Glucose-Capillary 107 (*) 70 - 99 mg/dL      HEENT: normal Cardio: RRR and IRIR, posititive murmur Resp: CTA B/L GI: BS positive Extremity:  No Edema Skin:   Left BK wound clean and intact. Neuro: Alert/Oriented and Normal Sensory Musc/Skel:  Normal and Other L-BKA with compressive dressing. Good rom except lacking a few degrees in extension at the left knee Gen:  NAD   Assessment/Plan: 1. Functional deficits secondary to L BKA which require 3+ hours per day of interdisciplinary therapy in a comprehensive inpatient rehab setting. Physiatrist is providing close team supervision and 24 hour management of active medical problems listed below. Physiatrist and rehab team continue to assess barriers to discharge/monitor patient progress toward functional and medical goals.  Should be a good prosthetic candidate. ACE applied. Marland Kitchen FIM: FIM - Bathing Bathing Steps Patient Completed:  Chest;Right Arm;Left Arm;Abdomen;Front perineal area;Buttocks;Right upper leg;Left upper leg;Right lower leg (including foot) Bathing: 5: Supervision: Safety issues/verbal cues (lateral leans for peri care)  FIM - Upper Body Dressing/Undressing Upper body dressing/undressing steps patient completed: Thread/unthread left sleeve of pullover shirt/dress;Put head through opening of pull over shirt/dress;Pull shirt over trunk;Thread/unthread right sleeve of pullover shirt/dresss Upper body dressing/undressing: 5: Set-up assist to: Obtain clothing/put away FIM - Lower Body Dressing/Undressing Lower body dressing/undressing steps patient completed: Thread/unthread right underwear leg;Thread/unthread left underwear leg;Thread/unthread right pants leg;Thread/unthread left pants leg;Don/Doff right sock Lower body dressing/undressing: 4: Min-Patient completed 75 plus % of tasks  FIM - Toileting Toileting steps completed by patient: Performs perineal hygiene Toileting Assistive Devices: Grab bar or rail for support Toileting: 2: Max-Patient completed 1 of 3 steps  FIM - Diplomatic Services operational officer Devices: Grab bars Toilet Transfers: 4-To toilet/BSC: Min A (steadying Pt. > 75%)  FIM - Bed/Chair Transfer Bed/Chair Transfer Assistive Devices: Arm rests Bed/Chair Transfer: 5: Supine > Sit: Supervision (verbal cues/safety issues);5: Sit > Supine: Supervision (verbal cues/safety issues);4: Chair or W/C > Bed: Min A (steadying Pt. > 75%);4: Bed > Chair or W/C: Min A (steadying Pt. > 75%)  FIM - Locomotion: Wheelchair Distance: 150 Locomotion: Wheelchair: 5: Travels 150 ft or more: maneuvers on rugs and over door sills with supervision, cueing or coaxing FIM - Locomotion: Ambulation Locomotion: Ambulation Assistive Devices: Parallel bars Ambulation/Gait Assistance: 3: Mod assist Locomotion: Ambulation: 1: Travels less than 50 ft with moderate assistance (Pt: 50 -  74%)  Comprehension Comprehension Mode: Auditory Comprehension: 6-Follows complex conversation/direction: With extra time/assistive device  Expression Expression Mode: Verbal Expression: 6-Expresses complex ideas: With extra time/assistive device  Social Interaction Social Interaction: 6-Interacts appropriately with others with medication or extra time (anti-anxiety, antidepressant).  Problem Solving Problem Solving: 6-Solves complex problems: With extra time  Memory Memory: 6-More than reasonable amt of time  Medical Problem List and Plan:  1. DVT Prophylaxis/Anticoagulation: Pharmaceutical: Coumadin  2. Pain Management: prn medications effective.   -increased gabapentin to 400mg  TID for phantom limb pain which has helped thus far. Remains aler 3. Mood: Has a good outlook overall. LCSW to  follow up for formal evaluation.  4. Neuropsych: This patient is capable of making decisions on his/her own behalf.  5. DM type 2: monitor with AC/HS CBG checks. Continue metformin and amaryl daily. Use SSI for elevated BS.  6. Atrial Fibrillation: Monitor HR with bid checks. Needs to have HR checked apically. Continue coumadin.  7. COPD: continue spirivia for symptom management 8. HTN: Will monitor with bid checks.   Continue HCTZ, lasix and lisinopril.    9. ABLA:  Iron supplement added.   hgb 7.8 continue to monitor   LOS (Days) 8 A FACE TO FACE EVALUATION WAS PERFORMED  Deborrah Mabin T 07/02/2012, 7:16 AM

## 2012-07-02 NOTE — Progress Notes (Signed)
Physical Therapy Session Note  Patient Details  Name: Sydney [name] MRN: [id number] Date of Birth: 10-16-1920  Today's Date: 07/02/2012 Time: 2952-8413 Time Calculation (min): 44 min  Short Term Goals: Week 2:  PT Short Term Goal 1 (Week 2): =LTGS  Skilled Therapeutic Interventions/Progress Updates:  This session focused on WC mobility supervision 150' with 2 seated rest breaks due to arm fatigue on indoor tiled surfaces.  Car transfer with min assist to place board and min guard assist for safety during the transfer to a sedan height car.  Verbal cues for sequencing, safe hand placement and safety overall.  Standing in parallel bars sit to stand mod assist due to PM fatigue, standing min assist once upright.  Standing there-ex: hip flexion with knee straight, hip flexion with knee bent, hip abduction, hip extension, knee flexion with hip straight all x 10 each left leg AROM.  Seated there-ex: LAQs with 5 second holds x 10 left, hip external rotation with knee straight left x 10 reps (encouraged her to do this one during TV commercials).     Therapy Documentation Precautions:  Precautions Precautions: Fall Restrictions Weight Bearing Restrictions: Yes LLE Weight Bearing: Non weight bearing General:   Vital Signs: Therapy Vitals Temp: 97.9 F (36.6 C) Temp src: Oral Pulse Rate: 80  Resp: 16  BP: 125/50 mmHg Patient Position, if appropriate: Lying Oxygen Therapy SpO2: 98 % Pain: Pain Assessment Pain Assessment: 0-10 Pain Score:  (pt did not rate) Pain Type: Phantom pain Pain Location: Leg Pain Orientation: Left Pain Intervention(s): Repositioned;Ambulation/increased activity Mobility:   Locomotion : Ambulation Ambulation/Gait Assistance: 3: Mod assist Wheelchair Mobility Distance: 150   See FIM for current functional status  Therapy/Group: Individual Therapy  Lurena Joiner B. Oma Marzan, PT, DPT [phone]   07/02/2012, 4:39 PM

## 2012-07-02 NOTE — Progress Notes (Signed)
Physical Therapy Weekly Progress Note  Patient Details  Name: Sydney [name] MRN: [id number] Date of Birth: [birth year]  Today's Date: 07/02/2012 Time: 4098-1191 Time Calculation: 47 minutes Individual Therapy and Co-Treatment with Recreational therapy Treatment session focused on w/c mobility in controlled and home environment (carpeted surface and kitchen maneuvering), short distance gait x 5' (mod A with RW, poor carryover for safety with RW), and dynamic standing balance/tolerance in kitchen to load dishwasher requiring min/mod A. W/c parts management with assist and cueing for set up to transfer back to bed. Transferred with S squat pivot technique.     Patient has met 3 of 5 short term goals.  Pt is progressing towards other short term goals but is still requiring up to mod A for very short distance gait due to poor use of UE's to clear R foot. Pt has difficulty managing the RW for stand pivot transfers and have focused on squat pivot technique mainly for increased independence. Pt continuing to require cues and assist with w/c parts management for transfers and family education with pt's daughter has begun. At this time anticipating and recommending pt to use w/c primarily in home due to poor foot clearance and RW management with gait with increased fall risk. Family is to have ramp built for home access and will recommend gait with HHPT.   Patient continues to demonstrate the following deficits: decreased strength, decreased standing balance, decreased endurance, impaired sensation/phantom pains, and therefore will continue to benefit from skilled PT intervention to enhance overall performance with activity tolerance, balance and ability to compensate for deficits.  Patient progressing toward long term goals..  Continue plan of care.  PT Short Term Goals Week 1:  PT Short Term Goal 1 (Week 1): Pt will perform sit <> stand from variety of surfaces with min assist, min verbal cues PT Short  Term Goal 1 - Progress (Week 1): Met PT Short Term Goal 2 (Week 1): Pt will perform stand pivot transfers with min assist PT Short Term Goal 2 - Progress (Week 1): Progressing toward goal PT Short Term Goal 3 (Week 1): Pt will propel wheelchair x 100' over controlled environment with supervision PT Short Term Goal 3 - Progress (Week 1): Met PT Short Term Goal 4 (Week 1): Pt will ambulate x 15' with RW, min assist PT Short Term Goal 4 - Progress (Week 1): Not met PT Short Term Goal 5 (Week 1): Pt will report complience with performing HEP 75% of days outside of scheduled therapy time PT Short Term Goal 5 - Progress (Week 1): Met Week 2:  PT Short Term Goal 1 (Week 2): =LTGS  Skilled Therapeutic Interventions/Progress Updates:  Ambulation/gait training;Balance/vestibular training;Disease management/prevention;Discharge planning;Community reintegration;DME/adaptive equipment instruction;Functional mobility training;Psychosocial support;Patient/family education;Pain management;Neuromuscular re-education;Skin care/wound management;Splinting/orthotics;Stair training;Therapeutic Activities;UE/LE Coordination activities;UE/LE Strength taining/ROM;Therapeutic Exercise;Wheelchair propulsion/positioning   Therapy Documentation Precautions:  Precautions Precautions: Fall Restrictions Weight Bearing Restrictions: Yes LLE Weight Bearing: Non weight bearing   Pain: C/o phantom pains intermittently - premedicated.  See FIM for current functional status   Karolee Stamps [facility] 07/02/2012, 1:28 PM

## 2012-07-02 NOTE — Progress Notes (Signed)
ANTICOAGULATION CONSULT NOTE - Follow Up Consult  Pharmacy Consult:  Coumadin Indication:  History of atrial fibrillation + VTE prophylaxis  Allergies  Allergen Reactions  . Other     Narcotics=makes her very disoriented  . Percocet (Oxycodone-Acetaminophen) Other (See Comments)    Dilerium, hallucinations  . Tramadol Anxiety  . Vicodin (Hydrocodone-Acetaminophen) Anxiety    Patient Measurements: Weight: 130 lb 15.3 oz (59.4 kg) (standing scale)  Vital Signs: Temp: 97.6 F (36.4 C) (11/19 0433) Temp src: Oral (11/19 0433) BP: 127/65 mmHg (11/19 0433) Pulse Rate: 66  (11/19 0433)  Labs:  Alvira Philips 07/02/12 0620 07/01/12 0615  HGB -- --  HCT -- --  PLT -- --  APTT -- --  LABPROT 21.9* 20.8*  INR 2.00* 1.87*  HEPARINUNFRC -- --  CREATININE -- --  CKTOTAL -- --  CKMB -- --  TROPONINI -- --    The CrCl is unknown because both a height and weight (above a minimum accepted value) are required for this calculation.      Assessment: 58 YOF continues on Coumadin for history of atrial fibrillation and for VTE prophylaxis s/p left transtibial amputation.  INR increased back to therapeutic level today.  No bleeding reported.   Goal of Therapy:  INR 2-3 Monitor platelets by anticoagulation protocol: Yes    Plan:  - Coumadin 3mg  PO today  - Daily PT / INR - Monitor CBGs / PO intake    Kiegan Macaraeg D. Laney Potash, PharmD, BCPS Pager:  [phone] 07/02/2012, 2:15 PM

## 2012-07-02 NOTE — Patient Care Conference (Signed)
Inpatient RehabilitationTeam Conference Note Date: 07/02/2012   Time: 2:20 PM    Patient Name: Sydney [name]      Medical Record Number: [id number]  Date of Birth: 1921/07/21 Sex: Female         Room/Bed: 4038/4038-01 Payor Info: Payor: MEDICARE  Plan: MEDICARE PART A AND B  Product Type: *No Product type*     Admitting Diagnosis: LT BKA  Admit Date/Time:  06/24/2012  3:20 PM Admission Comments: No comment available   Primary Diagnosis:  Unilateral complete BKA Principal Problem: Unilateral complete BKA  Patient Active Problem List   Diagnosis Date Noted  . Unilateral complete BKA-left 06/24/2012  . Osteomyelitis of toe 01/05/2012  . Osteomyelitis of toe of left foot 01/05/2012  . Chronic osteomyelitis of toe 11/09/2011  . HYPOTHYROIDISM 06/11/2007  . DIABETES MELLITUS, TYPE II 06/11/2007  . HYPERLIPIDEMIA 06/11/2007  . HYPERTENSION 06/11/2007  . Atrial fibrillation 06/11/2007  . COPD 06/11/2007    Expected Discharge Date: Expected Discharge Date: 07/05/12  Team Members Present: Physician: Dr. Faith Rogue Social Worker Present: Amada Jupiter, LCSW Nurse Present: Other (comment) Berta Minor, RN) PT Present: Karolee Stamps, PT OT Present: Edwin Cap, OT Other (Discipline and Name): Tora Duck, PPS Coordinator and Bayard Hugger, RN     Current Status/Progress Goal Weekly Team Focus  Medical   improving pain control. dressing removed and ACE applied  good pain control, independence with stump maintenance  improve pain, manage wound   Bowel/Bladder   continent bowel and bladder lbm 11-18   min assist   up to bathroom for all toileting    Swallow/Nutrition/ Hydration             ADL's   overall supervision except min assist for LB dressing & shower transfers  overall supervision except min assist for shower transfers  overall activity tolerance/endurance, UE strengthening, sit/stands, lateral leans, d/c planning   Mobility   min A overall w/c level; mod A very  short distance gait  S overall except downgraded gait goals to min A x 10' (poor foot clearance and strength)  standing balance, strengthening, endurance, family education   Communication             Safety/Cognition/ Behavioral Observations  n/a         Pain   left leg  phantom pain tyllenol 650mg  po every 6 hours and robaxin 500mg  po every 6hours  min assist less than 3  monitor effectiveness of pain med educate on descensitizing limb   Skin   left bka incision staples W.N.L.  now new breakdown   educate on wound care     Rehab Goals Patient on target to meet rehab goals: Yes *See Interdisciplinary Assessment and Plan and progress notes for long and short-term goals  Barriers to Discharge: see prior    Possible Resolutions to Barriers:  educating patient on wound care, mobility and safety techniques    Discharge Planning/Teaching Needs:  home with daughter and son-in-law to provide 24/7 assistance      Team Discussion:  Excellent progress and on target for d/c end of week.  No concerns.  Revisions to Treatment Plan:  None   Continued Need for Acute Rehabilitation Level of Care: The patient requires daily medical management by a physician with specialized training in physical medicine and rehabilitation for the following conditions: Daily direction of a multidisciplinary physical rehabilitation program to ensure safe treatment while eliciting the highest outcome that is of practical value to the patient.: Yes  Daily medical management of patient stability for increased activity during participation in an intensive rehabilitation regime.: Yes Daily analysis of laboratory values and/or radiology reports with any subsequent need for medication adjustment of medical intervention for : Other;Post surgical problems  [name] 07/02/2012, 2:42 PM

## 2012-07-02 NOTE — Progress Notes (Signed)
Occupational Therapy Session Note  Patient Details  Name: Sydney [name] MRN: [id number] Date of Birth: Dec 15, 1920  Today's Date: 07/02/2012 Time: 1000-1100 Time Calculation (min): 60 min  Short Term Goals: Week 1:  OT Short Term Goal 1 (Week 1): Patient will bathe 9/9 body parts with min/steady assist OT Short Term Goal 2 (Week 1): Education and practice regarding lateral leans will be addressed to help increase independence with BADLs OT Short Term Goal 3 (Week 1): Patient will perform toileting tasks with moderate assistance (2/3 steps) OT Short Term Goal 4 (Week 1): Patient willl perform sit-> stands with min assist to help increase independence with BADLs  Skilled Therapeutic Interventions/Progress Updates:  Patient found supine in bed. Engaged in bed mobility and patient transferred from edge of bed -> w/c with close supervision. Therapist then propelled patient into bathroom for shower transfer on 3-in-1. Patient then performed UB/LB bathing in seated position, performing lateral leans for peri care. Focused skilled intervention on education to patient's daughter regarding wrapping left residual limb during shower to prevent it from getting wet, shower stall transfer, w/c mobility/management, UB/LB bathing & dressing, overall activity tolerance/endurance, sit/stands, dynamic standing balance/tolerance/endurance, and grooming tasks seated at sink. Plan to complete family education with daughter tomorrow. At end of session left patient seated in w/c at sink with daughter present in room.   Precautions:  Precautions Precautions: Fall Restrictions Weight Bearing Restrictions: Yes LLE Weight Bearing: Non weight bearing  See FIM for current functional status  Therapy/Group: Individual Therapy  Eloni Darius 07/02/2012, 11:01 AM

## 2012-07-02 NOTE — Progress Notes (Signed)
Recreational Therapy Session Note  Patient Details  Name: KAITY PITSTICK MRN: [id number] Date of Birth: 1921/03/27 Today's Date: 07/02/2012 Time:  830-9 Pain: c/o intermittent phantom, RN in to administer meds Skilled Therapeutic Interventions/Progress Updates: Pt sat EOB with discussion focused on pain management techniques including diaphragmatic breathing, therapeutic use of music, and leisure activities for diversion.  Pt stated understanding.  Session 2:  1450-1515, no c/o Treatment session focused on w/c mobility (carpeted surface and kitchen maneuvering), short distance gait x 5' (mod A with RW, poor carryover for safety with RW), and dynamic standing balance/tolerance in kitchen to load dishwasher requiring min/mod A. W/c parts management with assist and cueing for set up to transfer back to bed. Transferred with S squat pivot technique.    Paizlee Kinder 07/02/2012, 5:32 PM

## 2012-07-03 ENCOUNTER — Inpatient Hospital Stay (HOSPITAL_COMMUNITY): Payer: Medicare Other | Admitting: Occupational Therapy

## 2012-07-03 ENCOUNTER — Inpatient Hospital Stay (HOSPITAL_COMMUNITY): Payer: Medicare Other | Admitting: *Deleted

## 2012-07-03 ENCOUNTER — Inpatient Hospital Stay (HOSPITAL_COMMUNITY): Payer: Medicare Other

## 2012-07-03 DIAGNOSIS — I739 Peripheral vascular disease, unspecified: Secondary | ICD-10-CM

## 2012-07-03 DIAGNOSIS — L98499 Non-pressure chronic ulcer of skin of other sites with unspecified severity: Secondary | ICD-10-CM

## 2012-07-03 DIAGNOSIS — S88119A Complete traumatic amputation at level between knee and ankle, unspecified lower leg, initial encounter: Secondary | ICD-10-CM

## 2012-07-03 LAB — GLUCOSE, CAPILLARY
Glucose-Capillary: 154 mg/dL — ABNORMAL HIGH (ref 70–99)
Glucose-Capillary: 68 mg/dL — ABNORMAL LOW (ref 70–99)
Glucose-Capillary: 75 mg/dL (ref 70–99)

## 2012-07-03 LAB — PROTIME-INR: Prothrombin Time: 21.5 seconds — ABNORMAL HIGH (ref 11.6–15.2)

## 2012-07-03 MED ORDER — TRAZODONE HCL 50 MG PO TABS
25.0000 mg | ORAL_TABLET | Freq: Every day | ORAL | Status: DC
  Administered 2012-07-03 – 2012-07-04 (×2): 25 mg via ORAL
  Filled 2012-07-03 (×2): qty 1

## 2012-07-03 MED ORDER — WARFARIN SODIUM 4 MG PO TABS
4.0000 mg | ORAL_TABLET | Freq: Once | ORAL | Status: AC
  Administered 2012-07-03: 4 mg via ORAL
  Filled 2012-07-03: qty 1

## 2012-07-03 NOTE — Progress Notes (Signed)
Occupational Therapy Session Notes  Patient Details  Name: Sydney [name] MRN: [id number] Date of Birth: 1920/08/22  Today's Date: 07/03/2012  Short Term Goals: Week 1:  OT Short Term Goal 1 (Week 1): Patient will bathe 9/9 body parts with min/steady assist OT Short Term Goal 2 (Week 1): Education and practice regarding lateral leans will be addressed to help increase independence with BADLs OT Short Term Goal 3 (Week 1): Patient will perform toileting tasks with moderate assistance (2/3 steps) OT Short Term Goal 4 (Week 1): Patient willl perform sit-> stands with min assist to help increase independence with BADLs  Skilled Therapeutic Interventions/Progress Updates:   Session #1 [id number] - 60 Minutes No complaints of pain Individual Therapy Patient found supine in bed. Treatment focus on family education to patient's daughter regarding ADL. Emphasized family education on bed mobility, edge of bed -> w/c scoot pivot transfer, various transfers and different transfer methods, donning of plastic bag over left residual limb to prevent limb getting wet during shower, bathing at shower level, dressing in sit->stand position (recommend patient perform lateral leans for clothing management and pulling pants up to waist at this time), energy conservation techniques during ADL, and overall activity tolerance/endurance. Patient's daughter performed hand-on demonstration prn during session.  Session #2 1015 - 1045 - 30 Minutes No complaints of pain Individual Therapy Treatment focus on family education regarding shower stall transfer on/off tub transfer bench simulated like at home, toileting, and toilet transfer on/off elevated toilet seat. Patient's daughter demonstrated safe and effective transfers and transfer methods during session. Checked patient's daughter off on transfers within room.   Session #3 1330-1400 - 30 Minutes Co-Treatment with recreational therapy No complaints of  pain Treatment focus on w/c mobility, squat pivot couch transfer, psychosocial support, and overall activity tolerance/endurance.   Precautions:  Precautions Precautions: Fall Restrictions Weight Bearing Restrictions: Yes LLE Weight Bearing: Non weight bearing  See FIM for current functional status  Sydney [name] 07/03/2012, 8:51 AM

## 2012-07-03 NOTE — Progress Notes (Signed)
Physical Therapy Session Note  Patient Details  Name: [name] MRN: [id number] Date of Birth: Sep 06, 1920  Today's Date: 07/03/2012 Time: 1500-1530 Time Calculation (min): 30 min  Short Term Goals: Week 2:  PT Short Term Goal 1 (Week 2): =LTGS  Skilled Therapeutic Interventions/Progress Updates:    w/c mobility for endurance and strengthening including turns and doorway negotiation with extra time mod I in controlled environment. Focused on sit to stands, balance strategies, and awareness of limits of stability for dynamic standing balance to reach for and toss horseshoes. Pt varied from S to steady A for sit to stand (cues for foot and hand placement) and min A for balance. S transfer back to bed end of session to rest.   Therapy Documentation Precautions:  Precautions Precautions: Fall Restrictions Weight Bearing Restrictions: Yes LLE Weight Bearing: Non weight bearing  Pain:  c/o phantom pain in residual limb - notified RN for pain medication.   See FIM for current functional status  Therapy/Group: Individual Therapy  Karolee Stamps [facility] 07/03/2012, 3:33 PM

## 2012-07-03 NOTE — Progress Notes (Signed)
Patient ID: Sydney [name], female   DOB: June 11, 1921, 78 y.o.   MRN: [id number]    Subjective/Complaints:  Phantom pain better. Slept well again. Happy with progress  Review of Systems  Musculoskeletal: Positive for joint pain.  Neurological: Positive for sensory change.  All other systems reviewed and are negative.     Objective: Vital Signs: Blood pressure 145/69, pulse 62, temperature 97.6 F (36.4 C), temperature source Axillary, resp. rate 18, weight 59.4 kg (130 lb 15.3 oz), SpO2 97.00%. No results found. Results for orders placed during the hospital encounter of 06/24/12 (from the past 72 hour(s))  GLUCOSE, CAPILLARY     Status: Abnormal   Collection Time   06/30/12 11:06 AM      Component Value Range Comment   Glucose-Capillary 148 (*) 70 - 99 mg/dL   GLUCOSE, CAPILLARY     Status: Abnormal   Collection Time   06/30/12  4:35 PM      Component Value Range Comment   Glucose-Capillary 119 (*) 70 - 99 mg/dL   GLUCOSE, CAPILLARY     Status: Abnormal   Collection Time   06/30/12  9:09 PM      Component Value Range Comment   Glucose-Capillary 125 (*) 70 - 99 mg/dL    Comment 1 Notify RN     PROTIME-INR     Status: Abnormal   Collection Time   07/01/12  6:15 AM      Component Value Range Comment   Prothrombin Time 20.8 (*) 11.6 - 15.2 seconds    INR 1.87 (*) 0.00 - 1.49   GLUCOSE, CAPILLARY     Status: Abnormal   Collection Time   07/01/12  7:26 AM      Component Value Range Comment   Glucose-Capillary 109 (*) 70 - 99 mg/dL    Comment 1 Notify RN     GLUCOSE, CAPILLARY     Status: Abnormal   Collection Time   07/01/12 11:32 AM      Component Value Range Comment   Glucose-Capillary 167 (*) 70 - 99 mg/dL    Comment 1 Notify RN     URINE CULTURE     Status: Normal   Collection Time   07/01/12  1:05 PM      Component Value Range Comment   Specimen Description URINE, CLEAN CATCH      Special Requests NONE      Culture  Setup Time 07/01/2012 13:50      Colony Count  >=100,000 COLONIES/ML      Culture        Value: GROUP B STREP(S.AGALACTIAE)ISOLATED     Note: TESTING AGAINST S. AGALACTIAE NOT ROUTINELY PERFORMED DUE TO PREDICTABILITY OF AMP/PEN/VAN SUSCEPTIBILITY.   Report Status 07/02/2012 FINAL     GLUCOSE, CAPILLARY     Status: Abnormal   Collection Time   07/01/12  4:38 PM      Component Value Range Comment   Glucose-Capillary 55 (*) 70 - 99 mg/dL    Comment 1 Notify RN     GLUCOSE, CAPILLARY     Status: Normal   Collection Time   07/01/12  5:42 PM      Component Value Range Comment   Glucose-Capillary 91  70 - 99 mg/dL    Comment 1 Notify RN     GLUCOSE, CAPILLARY     Status: Abnormal   Collection Time   07/01/12  8:50 PM      Component Value Range Comment   Glucose-Capillary 107 (*)  70 - 99 mg/dL   PROTIME-INR     Status: Abnormal   Collection Time   07/02/12  6:20 AM      Component Value Range Comment   Prothrombin Time 21.9 (*) 11.6 - 15.2 seconds    INR 2.00 (*) 0.00 - 1.49   GLUCOSE, CAPILLARY     Status: Abnormal   Collection Time   07/02/12  7:30 AM      Component Value Range Comment   Glucose-Capillary 126 (*) 70 - 99 mg/dL    Comment 1 Notify RN     GLUCOSE, CAPILLARY     Status: Abnormal   Collection Time   07/02/12 11:19 AM      Component Value Range Comment   Glucose-Capillary 117 (*) 70 - 99 mg/dL    Comment 1 Notify RN     GLUCOSE, CAPILLARY     Status: Abnormal   Collection Time   07/02/12  4:20 PM      Component Value Range Comment   Glucose-Capillary 116 (*) 70 - 99 mg/dL    Comment 1 Notify RN     GLUCOSE, CAPILLARY     Status: Abnormal   Collection Time   07/02/12  9:51 PM      Component Value Range Comment   Glucose-Capillary 158 (*) 70 - 99 mg/dL   PROTIME-INR     Status: Abnormal   Collection Time   07/03/12  5:00 AM      Component Value Range Comment   Prothrombin Time 21.5 (*) 11.6 - 15.2 seconds    INR 1.95 (*) 0.00 - 1.49   GLUCOSE, CAPILLARY     Status: Normal   Collection Time   07/03/12   7:39 AM      Component Value Range Comment   Glucose-Capillary 81  70 - 99 mg/dL    Comment 1 Notify RN        HEENT: normal Cardio: RRR and IRIR, posititive murmur Resp: CTA B/L GI: BS positive Extremity:  No Edema Skin:   Left BK wound clean and intact. Neuro: Alert/Oriented and Normal Sensory Musc/Skel:  Normal and Other L-BKA with compressive dressing. Good rom except lacking a few degrees in extension at the left knee Gen:  NAD   Assessment/Plan: 1. Functional deficits secondary to L BKA which require 3+ hours per day of interdisciplinary therapy in a comprehensive inpatient rehab setting. Physiatrist is providing close team supervision and 24 hour management of active medical problems listed below. Physiatrist and rehab team continue to assess barriers to discharge/monitor patient progress toward functional and medical goals.  Should be a good prosthetic candidate. ACE applied. Marland Kitchen FIM: FIM - Bathing Bathing Steps Patient Completed: Chest;Right Arm;Left Arm;Abdomen;Front perineal area;Buttocks;Right upper leg;Left upper leg;Right lower leg (including foot) Bathing: 5: Supervision: Safety issues/verbal cues  FIM - Upper Body Dressing/Undressing Upper body dressing/undressing steps patient completed: Thread/unthread right sleeve of pullover shirt/dresss;Thread/unthread left sleeve of pullover shirt/dress;Put head through opening of pull over shirt/dress;Pull shirt over trunk Upper body dressing/undressing: 5: Set-up assist to: Obtain clothing/put away FIM - Lower Body Dressing/Undressing Lower body dressing/undressing steps patient completed: Thread/unthread right underwear leg;Pull underwear up/down;Thread/unthread right pants leg;Thread/unthread left pants leg;Thread/unthread left underwear leg;Pull pants up/down;Don/Doff right sock;Don/Doff right shoe Lower body dressing/undressing: 4: Min-Patient completed 75 plus % of tasks  FIM - Toileting Toileting steps completed by  patient: Performs perineal hygiene Toileting Assistive Devices: Grab bar or rail for support Toileting: 0: Activity did not occur  FIM - Event organiser  Museum/gallery curator Devices: Therapist, music Transfers: 0-Activity did not occur  FIM - Banker Devices: Sliding board;Arm rests Bed/Chair Transfer: 4: Bed > Chair or W/C: Min A (steadying Pt. > 75%);4: Chair or W/C > Bed: Min A (steadying Pt. > 75%)  FIM - Locomotion: Wheelchair Distance: 150 Locomotion: Wheelchair: 5: Travels 150 ft or more: maneuvers on rugs and over door sills with supervision, cueing or coaxing FIM - Locomotion: Ambulation Locomotion: Ambulation Assistive Devices: Designer, industrial/product Ambulation/Gait Assistance: 3: Mod assist Locomotion: Ambulation: 0: Activity did not occur  Comprehension Comprehension Mode: Auditory Comprehension: 6-Follows complex conversation/direction: With extra time/assistive device  Expression Expression Mode: Verbal Expression: 6-Expresses complex ideas: With extra time/assistive device  Social Interaction Social Interaction: 6-Interacts appropriately with others with medication or extra time (anti-anxiety, antidepressant).  Problem Solving Problem Solving: 6-Solves complex problems: With extra time  Memory Memory: 6-More than reasonable amt of time  Medical Problem List and Plan:  1. DVT Prophylaxis/Anticoagulation: Pharmaceutical: Coumadin  2. Pain Management: prn medications effective.   -increased gabapentin to 400mg  TID for phantom limb pain which has helped thus far. Remains aler 3. Mood: Has a good outlook overall. LCSW to follow up for formal evaluation.  4. Neuropsych: This patient is capable of making decisions on his/her own behalf.  5. DM type 2: monitor with AC/HS CBG checks. Continue metformin and amaryl daily. Use SSI for elevated BS.  6. Atrial Fibrillation: Monitor HR with bid checks. Needs to have HR checked  apically. Continue coumadin.  7. COPD: continue spirivia for symptom management 8. HTN: Will monitor with bid checks.   Continue HCTZ, lasix and lisinopril.    9. ABLA:  Iron supplement added.   hgb 7.8 continue to monitor   LOS (Days) 9 A FACE TO FACE EVALUATION WAS PERFORMED  [name],[name] 07/03/2012, 8:03 AM     Patient ID: Sydney [name], female   DOB: August 04, 1921, 78 y.o.   MRN: [id number]    Subjective/Complaints:  Phantom pain better. Slept well.  Review of Systems  Musculoskeletal: Positive for joint pain.  Neurological: Positive for sensory change.  All other systems reviewed and are negative.     Objective: Vital Signs: Blood pressure 145/69, pulse 62, temperature 97.6 F (36.4 C), temperature source Axillary, resp. rate 18, weight 59.4 kg (130 lb 15.3 oz), SpO2 97.00%. No results found. Results for orders placed during the hospital encounter of 06/24/12 (from the past 72 hour(s))  GLUCOSE, CAPILLARY     Status: Abnormal   Collection Time   06/30/12 11:06 AM      Component Value Range Comment   Glucose-Capillary 148 (*) 70 - 99 mg/dL   GLUCOSE, CAPILLARY     Status: Abnormal   Collection Time   06/30/12  4:35 PM      Component Value Range Comment   Glucose-Capillary 119 (*) 70 - 99 mg/dL   GLUCOSE, CAPILLARY     Status: Abnormal   Collection Time   06/30/12  9:09 PM      Component Value Range Comment   Glucose-Capillary 125 (*) 70 - 99 mg/dL    Comment 1 Notify RN     PROTIME-INR     Status: Abnormal   Collection Time   07/01/12  6:15 AM      Component Value Range Comment   Prothrombin Time 20.8 (*) 11.6 - 15.2 seconds    INR 1.87 (*) 0.00 - 1.49   GLUCOSE, CAPILLARY  Status: Abnormal   Collection Time   07/01/12  7:26 AM      Component Value Range Comment   Glucose-Capillary 109 (*) 70 - 99 mg/dL    Comment 1 Notify RN     GLUCOSE, CAPILLARY     Status: Abnormal   Collection Time   07/01/12 11:32 AM      Component Value Range Comment    Glucose-Capillary 167 (*) 70 - 99 mg/dL    Comment 1 Notify RN     URINE CULTURE     Status: Normal   Collection Time   07/01/12  1:05 PM      Component Value Range Comment   Specimen Description URINE, CLEAN CATCH      Special Requests NONE      Culture  Setup Time 07/01/2012 13:50      Colony Count >=100,000 COLONIES/ML      Culture        Value: GROUP B STREP(S.AGALACTIAE)ISOLATED     Note: TESTING AGAINST S. AGALACTIAE NOT ROUTINELY PERFORMED DUE TO PREDICTABILITY OF AMP/PEN/VAN SUSCEPTIBILITY.   Report Status 07/02/2012 FINAL     GLUCOSE, CAPILLARY     Status: Abnormal   Collection Time   07/01/12  4:38 PM      Component Value Range Comment   Glucose-Capillary 55 (*) 70 - 99 mg/dL    Comment 1 Notify RN     GLUCOSE, CAPILLARY     Status: Normal   Collection Time   07/01/12  5:42 PM      Component Value Range Comment   Glucose-Capillary 91  70 - 99 mg/dL    Comment 1 Notify RN     GLUCOSE, CAPILLARY     Status: Abnormal   Collection Time   07/01/12  8:50 PM      Component Value Range Comment   Glucose-Capillary 107 (*) 70 - 99 mg/dL   PROTIME-INR     Status: Abnormal   Collection Time   07/02/12  6:20 AM      Component Value Range Comment   Prothrombin Time 21.9 (*) 11.6 - 15.2 seconds    INR 2.00 (*) 0.00 - 1.49   GLUCOSE, CAPILLARY     Status: Abnormal   Collection Time   07/02/12  7:30 AM      Component Value Range Comment   Glucose-Capillary 126 (*) 70 - 99 mg/dL    Comment 1 Notify RN     GLUCOSE, CAPILLARY     Status: Abnormal   Collection Time   07/02/12 11:19 AM      Component Value Range Comment   Glucose-Capillary 117 (*) 70 - 99 mg/dL    Comment 1 Notify RN     GLUCOSE, CAPILLARY     Status: Abnormal   Collection Time   07/02/12  4:20 PM      Component Value Range Comment   Glucose-Capillary 116 (*) 70 - 99 mg/dL    Comment 1 Notify RN     GLUCOSE, CAPILLARY     Status: Abnormal   Collection Time   07/02/12  9:51 PM      Component Value Range  Comment   Glucose-Capillary 158 (*) 70 - 99 mg/dL   PROTIME-INR     Status: Abnormal   Collection Time   07/03/12  5:00 AM      Component Value Range Comment   Prothrombin Time 21.5 (*) 11.6 - 15.2 seconds    INR 1.95 (*) 0.00 - 1.49  GLUCOSE, CAPILLARY     Status: Normal   Collection Time   07/03/12  7:39 AM      Component Value Range Comment   Glucose-Capillary 81  70 - 99 mg/dL    Comment 1 Notify RN        HEENT: normal Cardio: RRR and IRIR, posititive murmur Resp: CTA B/L GI: BS positive Extremity:  No Edema Skin:   Left BK wound clean and intact. Neuro: Alert/Oriented and Normal Sensory Musc/Skel:  Normal and Other L-BKA with compressive dressing. Good rom except lacking a few degrees in extension at the left knee Gen:  NAD   Assessment/Plan: 1. Functional deficits secondary to L BKA which require 3+ hours per day of interdisciplinary therapy in a comprehensive inpatient rehab setting. Physiatrist is providing close team supervision and 24 hour management of active medical problems listed below. Physiatrist and rehab team continue to assess barriers to discharge/monitor patient progress toward functional and medical goals.  Should be a good prosthetic candidate. ACE applied. Marland Kitchen FIM: FIM - Bathing Bathing Steps Patient Completed: Chest;Right Arm;Left Arm;Abdomen;Front perineal area;Buttocks;Right upper leg;Left upper leg;Right lower leg (including foot) Bathing: 5: Supervision: Safety issues/verbal cues  FIM - Upper Body Dressing/Undressing Upper body dressing/undressing steps patient completed: Thread/unthread right sleeve of pullover shirt/dresss;Thread/unthread left sleeve of pullover shirt/dress;Put head through opening of pull over shirt/dress;Pull shirt over trunk Upper body dressing/undressing: 5: Set-up assist to: Obtain clothing/put away FIM - Lower Body Dressing/Undressing Lower body dressing/undressing steps patient completed: Thread/unthread right underwear  leg;Pull underwear up/down;Thread/unthread right pants leg;Thread/unthread left pants leg;Thread/unthread left underwear leg;Pull pants up/down;Don/Doff right sock;Don/Doff right shoe Lower body dressing/undressing: 4: Min-Patient completed 75 plus % of tasks  FIM - Toileting Toileting steps completed by patient: Performs perineal hygiene Toileting Assistive Devices: Grab bar or rail for support Toileting: 0: Activity did not occur  FIM - Diplomatic Services operational officer Devices: Therapist, music Transfers: 0-Activity did not occur  FIM - Banker Devices: Sliding board;Arm rests Bed/Chair Transfer: 4: Bed > Chair or W/C: Min A (steadying Pt. > 75%);4: Chair or W/C > Bed: Min A (steadying Pt. > 75%)  FIM - Locomotion: Wheelchair Distance: 150 Locomotion: Wheelchair: 5: Travels 150 ft or more: maneuvers on rugs and over door sills with supervision, cueing or coaxing FIM - Locomotion: Ambulation Locomotion: Ambulation Assistive Devices: Designer, industrial/product Ambulation/Gait Assistance: 3: Mod assist Locomotion: Ambulation: 0: Activity did not occur  Comprehension Comprehension Mode: Auditory Comprehension: 6-Follows complex conversation/direction: With extra time/assistive device  Expression Expression Mode: Verbal Expression: 6-Expresses complex ideas: With extra time/assistive device  Social Interaction Social Interaction: 6-Interacts appropriately with others with medication or extra time (anti-anxiety, antidepressant).  Problem Solving Problem Solving: 6-Solves complex problems: With extra time  Memory Memory: 6-More than reasonable amt of time  Medical Problem List and Plan:  1. DVT Prophylaxis/Anticoagulation: Pharmaceutical: Coumadin  2. Pain Management: prn medications effective.   -increased gabapentin to 400mg  TID for phantom limb pain which has helped. Remains alert  -discussed massage and visual feedback for this limb  also 3. Mood: Has a good outlook overall. LCSW to follow up for formal evaluation.  4. Neuropsych: This patient is capable of making decisions on his/her own behalf.  5. DM type 2: monitor with AC/HS CBG checks. Continue metformin and amaryl daily. Use SSI for elevated BS.  6. Atrial Fibrillation: Monitor HR with bid checks. Needs to have HR checked apically. Continue coumadin.  7. COPD: continue spirivia for symptom  management 8. HTN: Will monitor with bid checks.   Continue HCTZ, lasix and lisinopril.    9. ABLA:  Iron supplement added.   hgb 7.8 last week. Recheck tomorrow   LOS (Days) 9 A FACE TO FACE EVALUATION WAS PERFORMED  [name],[name] 07/03/2012, 8:03 AM

## 2012-07-03 NOTE — Progress Notes (Signed)
Social Work Patient ID: Sydney [name], female   DOB: 06-06-1921, 78 y.o.   MRN: [id number]  Have reviewed team conference with pt and daughter - both continue to be pleased with progress and are feeling ready for 11/22 discharge.  No concerns at this time.  Continue to follow.  Rachal Dvorsky

## 2012-07-03 NOTE — Progress Notes (Signed)
Recreational Therapy Session Note  Patient Details  Name: Sydney [name] MRN: [id number] Date of Birth: [birth year] Today's Date: 07/03/2012  Pain: c/o of intermittent phantom pain, premedicated Skilled Therapeutic Interventions/Progress Updates: session focused on activity tolerance, UE strengthening, and w/c mobility on unit with supervision. Trayvond Viets 07/03/2012, 4:40 PM

## 2012-07-03 NOTE — Progress Notes (Signed)
Nutrition Brief Note  Patient visit secondary to meal rounds and Increased length of stay.  BMI 22. Weight WNL based on current BMI.   Current diet order is CHO MOD MED, patient is consuming approximately 75-100% of meals at this time.  Bene protein 1 scoop tid.   Labs and medications reviewed.   No nutrition interventions warranted at this time. If nutrition issues arise, please consult RD.   Oran Rein, RD, LDN Clinical Inpatient Dietitian Pager:  [phone] Weekend and after hours pager:  [phone]

## 2012-07-03 NOTE — Progress Notes (Addendum)
ANTICOAGULATION CONSULT NOTE - Follow Up Consult  Pharmacy Consult:  Coumadin Indication:  History of atrial fibrillation + VTE prophylaxis  Allergies  Allergen Reactions  . Other     Narcotics=makes her very disoriented  . Percocet (Oxycodone-Acetaminophen) Other (See Comments)    Dilerium, hallucinations  . Tramadol Anxiety  . Vicodin (Hydrocodone-Acetaminophen) Anxiety    Patient Measurements: Weight: 130 lb 15.3 oz (59.4 kg) (standing scale)  Vital Signs: Temp: 97.6 F (36.4 C) (11/20 0541) Temp src: Axillary (11/20 0541) BP: 139/73 mmHg (11/20 0819) Pulse Rate: 62  (11/20 0541)  Labs:  Basename 07/03/12 0500 07/02/12 0620 07/01/12 0615  HGB -- -- --  HCT -- -- --  PLT -- -- --  APTT -- -- --  LABPROT 21.5* 21.9* 20.8*  INR 1.95* 2.00* 1.87*  HEPARINUNFRC -- -- --  CREATININE -- -- --  CKTOTAL -- -- --  CKMB -- -- --  TROPONINI -- -- --    The CrCl is unknown because both a height and weight (above a minimum accepted value) are required for this calculation.      Assessment: 57 YOF continues on Coumadin for history of atrial fibrillation and for VTE prophylaxis s/p left transtibial amputation.  INR slightly below goal.  No bleeding reported.     Goal of Therapy:  INR 2-3 Monitor platelets by anticoagulation protocol: Yes    Plan:  - Coumadin 4mg  PO today - Daily PT / INR - Monitor CBGs / PO intake    Sydney [name] D. Laney Potash, PharmD, BCPS Pager:  [phone] 07/03/2012, 10:14 AM

## 2012-07-03 NOTE — Progress Notes (Signed)
Hypoglycemic Event  CBG: 68  Treatment: 15 GM carbohydrate snack  Symptoms: None  Follow-up CBG: Time:1715 CBG Result:75  Possible Reasons for Event: Unknown  Comments/MD notified:Pamela Love PA    Sydney [name], Sydney [name]  Remember to initiate Hypoglycemia Order Set & complete

## 2012-07-03 NOTE — Progress Notes (Signed)
Physical Therapy Session Note  Patient Details  Name: Sydney [name] MRN: [id number] Date of Birth: [birth year]  Today's Date: 07/03/2012 Time: 1100-1158 Time Calculation (min): 58 min  Short Term Goals: Week 2:  PT Short Term Goal 1 (Week 2): =LTGS  Skilled Therapeutic Interventions/Progress Updates:    First half of session focused on family education with pt and pt's daughter who is primary caregiver reviewing basic transfers, w/c parts management and breakdown, slide board transfer for car transfers, energy conservation and recommendation to have pt work on walking more with HHPT until family attempts gait with pt. Daughter able to verbalize and demonstrate understanding of all tasks and both pt and daughter feel prepared for d/c on Friday.   Second part of session reviewed HEP for quad sets, sidelying hip abduction and extension, SLR, SAQ, and modified bridging x 10 reps each. Sit to stands with emphasis on technique and standing posture and worked on hip ROM in standing with close S (tends to use back of leg for support). W/c propulsion back to room for endurance and strengthening.   Therapy Documentation Precautions:  Precautions Precautions: Fall Restrictions Weight Bearing Restrictions: Yes LLE Weight Bearing: Non weight bearing  Pain: Pain Assessment Pain Assessment: No/denies pain  See FIM for current functional status  Therapy/Group: Individual Therapy  Karolee Stamps [facility] 07/03/2012, 12:04 PM

## 2012-07-04 ENCOUNTER — Inpatient Hospital Stay (HOSPITAL_COMMUNITY): Payer: Medicare Other | Admitting: Occupational Therapy

## 2012-07-04 ENCOUNTER — Inpatient Hospital Stay (HOSPITAL_COMMUNITY): Payer: Medicare Other | Admitting: *Deleted

## 2012-07-04 ENCOUNTER — Inpatient Hospital Stay (HOSPITAL_COMMUNITY): Payer: Medicare Other

## 2012-07-04 LAB — GLUCOSE, CAPILLARY

## 2012-07-04 LAB — PROTIME-INR
INR: 1.94 — ABNORMAL HIGH (ref 0.00–1.49)
Prothrombin Time: 21.4 seconds — ABNORMAL HIGH (ref 11.6–15.2)

## 2012-07-04 LAB — CBC
MCHC: 31.3 g/dL (ref 30.0–36.0)
RDW: 18.1 % — ABNORMAL HIGH (ref 11.5–15.5)

## 2012-07-04 MED ORDER — WARFARIN SODIUM 4 MG PO TABS
4.0000 mg | ORAL_TABLET | Freq: Once | ORAL | Status: AC
  Administered 2012-07-04: 4 mg via ORAL
  Filled 2012-07-04: qty 1

## 2012-07-04 NOTE — Progress Notes (Signed)
Physical Therapy Discharge Summary  Patient Details  Name: Sydney [name] MRN: [id number] Date of Birth: 24-Aug-1920  Today's Date: 07/04/2012 Time: 1530-1600 Time Calculation (min): 30 min Individual therapy C/o phantom pains but premedicated. W/c propulsion for endurance and strengthening limited distance due to time to try out her new w/c. Therapist adjusted legrest and w/c for pt fit and reviewed DME and d/c planning with pt and family while doing so. Attempted gait with min A x 4' with cues for technique. Velcro applied to new w/c for increased ability to find legrest levers.   Patient has met 4 of 6 long term goals due to improved activity tolerance, improved balance, improved postural control, increased strength, increased range of motion, decreased pain and ability to compensate for deficits.  Patient to discharge at a wheelchair level Supervision.   Patient's daughter is independent to provide the necessary physical and supervision assistance at discharge and successfully completed family education  Reasons goals not met: Pt unable to consistently gait 10' or with min A (still requiring mod A at times) due to poor foot clearance. Dynamic standing balance still inconsistent min A usually.   Recommendation:  Patient will benefit from ongoing skilled PT services in home health setting to continue to advance safe functional mobility, address ongoing impairments in ROM, strength, balance, gait, pre-prosthetic training, and minimize fall risk.  Equipment: 16x18 w/c with L amputee pad, basic cushion and back, and slideboard  Reasons for discharge: discharge from hospital  Patient/family agrees with progress made and goals achieved: Yes  PT Discharge Precautions/Restrictions Precautions Precautions: Fall Vision/Perception  Vision - History Baseline Vision: Wears glasses only for reading Visual History: Macular degeneration Patient Visual Report: No change from baseline   Cognition Overall Cognitive Status: Appears within functional limits for tasks assessed Memory: Impaired Memory Impairment: Decreased recall of new information Safety/Judgment: Appears intact Comments: requires cues and assist for w/c parts management, set up for transfers, and new information.   Motor  Motor Motor: Within Functional Limits (L BKA)  Locomotion  Ambulation Ambulation/Gait Assistance: 4: Min assist;3: Mod assist  Trunk/Postural Assessment  Cervical Assessment Cervical Assessment: Within Functional Limits Thoracic Assessment Thoracic Assessment: Exceptions to [facility] (kyphotic posture) Lumbar Assessment Lumbar Assessment: Within Functional Limits  Balance Balance Balance Assessed: Yes Static Sitting Balance Static Sitting - Level of Assistance: 6: Modified independent (Device/Increase time) Dynamic Sitting Balance Dynamic Sitting - Level of Assistance: 6: Modified independent (Device/Increase time) Static Standing Balance Static Standing - Level of Assistance: 4: Min assist;5: Stand by assistance Dynamic Standing Balance Dynamic Standing - Level of Assistance: 4: Min assist;3: Mod assist Extremity Assessment   RLE Assessment RLE Assessment: Exceptions to [facility] RLE AROM (degrees) RLE Overall AROM Comments: WFL RLE Strength RLE Overall Strength Comments: Generalized deconditioning, gorssly >/= 3+/5 LLE Assessment LLE Assessment: Exceptions to WFL LLE AROM (degrees) LLE Overall AROM Comments: limited by a few degrees of extension LLE Strength LLE Overall Strength Comments: strength grossly 3+/5; phantom pain often  See FIM for current functional status  Sydney [name] [facility] 07/04/2012, 5:04 PM

## 2012-07-04 NOTE — Plan of Care (Signed)
Problem: RH Balance Goal: LTG Patient will maintain dynamic standing balance (PT) LTG: Patient will maintain dynamic standing balance with assistance during mobility activities (PT)  Outcome: Not Met (add Reason) Requires min A at times for dynamic balance

## 2012-07-04 NOTE — Progress Notes (Signed)
ANTICOAGULATION CONSULT NOTE - Follow Up Consult  Pharmacy Consult:  Coumadin Indication:  History of atrial fibrillation + VTE prophylaxis  Allergies  Allergen Reactions  . Other     Narcotics=makes her very disoriented  . Percocet (Oxycodone-Acetaminophen) Other (See Comments)    Dilerium, hallucinations  . Tramadol Anxiety  . Vicodin (Hydrocodone-Acetaminophen) Anxiety   Patient Measurements: Weight: 130 lb 14.4 oz (59.376 kg)  Vital Signs: Temp: 97.6 F (36.4 C) (11/21 0500) Temp src: Oral (11/21 0500) BP: 130/44 mmHg (11/21 0500) Pulse Rate: 62  (11/21 0500)  Labs:  Basename 07/04/12 0605 07/03/12 0500 07/02/12 0620  HGB 7.5* -- --  HCT 24.0* -- --  PLT 198 -- --  APTT -- -- --  LABPROT 21.4* 21.5* 21.9*  INR 1.94* 1.95* 2.00*  HEPARINUNFRC -- -- --  CREATININE -- -- --  CKTOTAL -- -- --  CKMB -- -- --  TROPONINI -- -- --   Assessment: 50 YOF continues on Coumadin for history of atrial fibrillation and for VTE prophylaxis s/p left transtibial amputation.  INR just below goal range - dose of Warfarin increased yesterday.  No bleeding reported although she is anemic with low H/H.  Her platelets remain stable at 198K.    Goal of Therapy:  INR 2-3 Monitor platelets by anticoagulation protocol: Yes   Plan:  - Coumadin 4mg  PO today - Daily PT / INR - Monitor CBGs / PO intake - Monitor for s/s of bleeding with low H/h  Nadara Mustard, PharmD., MS Clinical Pharmacist Pager:  [phone] Thank you for allowing pharmacy to be part of this patients care team. 07/04/2012, 10:22 AM

## 2012-07-04 NOTE — Plan of Care (Signed)
Problem: RH Ambulation Goal: LTG Patient will ambulate in controlled environment (PT) LTG: Patient will ambulate in a controlled environment, # of feet with assistance (PT).  Outcome: Not Met (add Reason) Pt unable to reach 10' and at times requires mod A still due to poor foot clearance.

## 2012-07-04 NOTE — Progress Notes (Signed)
Recreational Therapy Discharge Summary Patient Details  Name: Sydney [name] MRN: [id number] Date of Birth: May 09, 1921 Today's Date: 07/04/2012  Long term goals set: 1  Long term goals met: 1  Comments on progress toward goals: pt has made great progress toward goal and is ready for discharge home with family to provide 24 hour supervision/assist.  Pt requires supervision-set up assist and verbal cuing to complete tasks safety.  Education provided on pain management techniques including diaphragmatic breathing &  therapeutic use of music.  Pt & pt's daughter stated understanding  Reasons for discharge: discharge from hospital Patient/family agrees with progress made and goals achieved: Yes  Bettylou Frew 07/04/2012, 3:17 PM

## 2012-07-04 NOTE — Progress Notes (Signed)
Physical Therapy Session Note  Patient Details  Name: ANDALYN HECKSTALL MRN: [id number] Date of Birth: 05-22-1921  Today's Date: 07/04/2012  Short Term Goals: Week 2:  PT Short Term Goal 1 (Week 2): =LTGS  Session #1: [id number] (60 minutes)   Individual therapy. C/o phantom pain this morning but premedicated. Focused session on family education with pt and pt's daughter in ADL apartment for real bed transfers, furniture transfers, w/c mobility in bedroom and kitchen to simulate household, and problem solving set up of household/bedroom. Pt's daughter able to return demonstrate all aspects and both feel prepared for d/c tomorrow.  Session #2: 1130-1158 (28 minutes) Individual therapy. No complaints of pain. Focused on w/c mobility in home environment on carpeted surface and obstacle negotiation and able to complete with S (some cues for turns needed and for technique) and extra time.   Therapy Documentation Precautions:  Precautions Precautions: Fall Restrictions Weight Bearing Restrictions: Yes LLE Weight Bearing: Non weight bearing   See FIM for current functional status  Therapy/Group: Individual Therapy  Karolee Stamps [facility] 07/04/2012, 12:20 PM

## 2012-07-04 NOTE — Progress Notes (Signed)
11/21  Diabetes Coordinator:  Looking at patient's CBGs, has had a few low blood sugars(below 70 mg/dl).  Does Novolog sensitive correction scale need to be continued?  CBGs on 11/20  81-154-68/75-128 mg/dl

## 2012-07-04 NOTE — Progress Notes (Signed)
Occupational Therapy Session Notes & Discharge Summary  Patient Details  Name: Sydney [name] MRN: [id number] Date of Birth: 1921/04/10  Today's Date: 07/04/2012  SESSION NOTES  Session #1 [phone] - 60 Minutes Individual Therapy Patient with 7/10 complaints of phantom pain, RN made aware Patient found supine in bed, sleepy but willing to participate. Patient engaged in bed mobility for edge of bed -> w/c scoot pivot transfer with supervision. Patient then performed shower stall transfer on/off shower seat with minimal assistance. Focused skilled intervention on UB/LB bathing & dressing, various transfers, w/c management, w/c mobility, overall activity tolerance/endurance, lateral leans for peri care and clothing management, using inspection mirror on RLE for diabetes management, sit/stands with daughter, dynamic standing balance with daughter, and grooming tasks seated at sink independently. At end of session left patient seated at sink in w/c with daughter present.   Session #2 1330-1400 - 30 Minutes Individual Therapy No complaints of pain Patient found seated in w/c. Patient propelled self from room -> therapy gym with supervision and engaged in UE strengthening exercises in gym to help increase overall UE strength, help with sit/stand independence, help with dynamic standing balance independence, and overall endurance. Patient completed 3 sets of 10 w/c pushups and 5 minutes of SCIFIT machine for exercise. Therapist propelled patient back to room and left patient seated in w/c with call bell & phone within reach.   -----------------------------------------------------------------------------------------------------------------------  DISCHARGE SUMMARY Patient has met 9 of 10 long term goals due to improved activity tolerance, improved balance, postural control, ability to compensate for deficits, improved attention, improved awareness and improved coordination.  Patient to discharge at  overall supervision -> steady/min assist level.  Patient's care partner (daughter, [name]) is independent to provide the necessary supervision and min assistance at discharge.    Reasons goals not met: Patient did not meet LTG of supervision for dynamic standing balance. Patient continues to demonstrate poor standing balance/tolerance/endurance and requires min assist during dynamic standing activities and when pulling pants up to waist.   Recommendation:  Patient will benefit from ongoing skilled OT services in home health setting to continue to advance functional skills in the area of BADL, iADL and Reduce care partner burden.  Equipment: tub transfer bench and drop arm BSC  Reasons for discharge: treatment goals met and discharge from hospital  Patient/family agrees with progress made and goals achieved: Yes  Precautions/Restrictions  Precautions Precautions: Fall Restrictions Weight Bearing Restrictions: Yes LLE Weight Bearing: Non weight bearing  Vital Signs Therapy Vitals Temp: 97.6 F (36.4 C) Temp src: Oral Pulse Rate: 62  Resp: 19  BP: 130/44 mmHg Patient Position, if appropriate: Lying Oxygen Therapy SpO2: 99 % O2 Device: None (Room air)  Pain Pain Assessment Pain Assessment: 0-10 Pain Score:   7 Pain Type: Phantom pain Pain Location: Leg Pain Orientation: Left Pain Descriptors: Sharp Pain Intervention(s): RN made aware  ADL - See FIM  Vision/Perception  Vision - History Baseline Vision: Wears glasses only for reading Visual History: Macular degeneration Patient Visual Report: No change from baseline Perception Perception: Within Functional Limits Praxis Praxis: Intact   Cognition Overall Cognitive Status: Appears within functional limits for tasks assessed Arousal/Alertness: Awake/alert Orientation Level: Oriented X4 Memory: Appears intact Awareness: Appears intact Problem Solving: Appears intact Safety/Judgment: Appears  intact  Sensation Sensation Additional Comments: minimal numbness & tingling throughout fingertips/hands secondary to RA per patient report Coordination Gross Motor Movements are Fluid and Coordinated: Yes Fine Motor Movements are Fluid and Coordinated: Yes  Motor - See  Discharge Navigator  Mobility - See Discharge Navigator  Trunk/Postural Assessment - See Discharge Navigator  Balance- See Discharge Navigator  Extremity/Trunk Assessment RUE Assessment RUE Assessment: Within Functional Limits (pain and swelling due to neruopathy and RA) LUE Assessment LUE Assessment: Within Functional Limits (pain and swelling due to neruopathy and RA)  See FIM for current functional status  Mical Brun 07/04/2012, 8:51 AM

## 2012-07-04 NOTE — Progress Notes (Signed)
Recreational Therapy Session Note  Patient Details  Name: Sydney [name] MRN: [id number] Date of Birth: Jan 08, 1921 Today's Date: 07/04/2012 Time:  1540-1610 Pain: c/o intermittent phantom pain, premedicated Skilled Therapeutic Interventions/Progress Updates: discharge planning with pt, pt's daughter & son in law in regards to DME and use of time post discharge.  Pt is supervision for seated TR tasks due to need for set up assist and occasional cuing for unfamiliar activities.  Abdel Effinger 07/04/2012, 3:16 PM

## 2012-07-04 NOTE — Progress Notes (Signed)
Patient ID: Sydney [name], female   DOB: 16-Jan-1921, 78 y.o.   MRN: [id number]    Subjective/Complaints:  Phantom pain spike last night. Would like flu vac  Review of Systems  Musculoskeletal: Positive for joint pain.  Neurological: Positive for sensory change.  All other systems reviewed and are negative.     Objective: Vital Signs: Blood pressure 130/44, pulse 62, temperature 97.6 F (36.4 C), temperature source Oral, resp. rate 19, weight 59.376 kg (130 lb 14.4 oz), SpO2 99.00%. No results found. Results for orders placed during the hospital encounter of 06/24/12 (from the past 72 hour(s))  GLUCOSE, CAPILLARY     Status: Abnormal   Collection Time   07/01/12 11:32 AM      Component Value Range Comment   Glucose-Capillary 167 (*) 70 - 99 mg/dL    Comment 1 Notify RN     URINE CULTURE     Status: Normal   Collection Time   07/01/12  1:05 PM      Component Value Range Comment   Specimen Description URINE, CLEAN CATCH      Special Requests NONE      Culture  Setup Time 07/01/2012 13:50      Colony Count >=100,000 COLONIES/ML      Culture        Value: GROUP B STREP(S.AGALACTIAE)ISOLATED     Note: TESTING AGAINST S. AGALACTIAE NOT ROUTINELY PERFORMED DUE TO PREDICTABILITY OF AMP/PEN/VAN SUSCEPTIBILITY.   Report Status 07/02/2012 FINAL     GLUCOSE, CAPILLARY     Status: Abnormal   Collection Time   07/01/12  4:38 PM      Component Value Range Comment   Glucose-Capillary 55 (*) 70 - 99 mg/dL    Comment 1 Notify RN     GLUCOSE, CAPILLARY     Status: Normal   Collection Time   07/01/12  5:42 PM      Component Value Range Comment   Glucose-Capillary 91  70 - 99 mg/dL    Comment 1 Notify RN     GLUCOSE, CAPILLARY     Status: Abnormal   Collection Time   07/01/12  8:50 PM      Component Value Range Comment   Glucose-Capillary 107 (*) 70 - 99 mg/dL   PROTIME-INR     Status: Abnormal   Collection Time   07/02/12  6:20 AM      Component Value Range Comment   Prothrombin  Time 21.9 (*) 11.6 - 15.2 seconds    INR 2.00 (*) 0.00 - 1.49   GLUCOSE, CAPILLARY     Status: Abnormal   Collection Time   07/02/12  7:30 AM      Component Value Range Comment   Glucose-Capillary 126 (*) 70 - 99 mg/dL    Comment 1 Notify RN     GLUCOSE, CAPILLARY     Status: Abnormal   Collection Time   07/02/12 11:19 AM      Component Value Range Comment   Glucose-Capillary 117 (*) 70 - 99 mg/dL    Comment 1 Notify RN     GLUCOSE, CAPILLARY     Status: Abnormal   Collection Time   07/02/12  4:20 PM      Component Value Range Comment   Glucose-Capillary 116 (*) 70 - 99 mg/dL    Comment 1 Notify RN     GLUCOSE, CAPILLARY     Status: Abnormal   Collection Time   07/02/12  9:51 PM  Component Value Range Comment   Glucose-Capillary 158 (*) 70 - 99 mg/dL   PROTIME-INR     Status: Abnormal   Collection Time   07/03/12  5:00 AM      Component Value Range Comment   Prothrombin Time 21.5 (*) 11.6 - 15.2 seconds    INR 1.95 (*) 0.00 - 1.49   GLUCOSE, CAPILLARY     Status: Normal   Collection Time   07/03/12  7:39 AM      Component Value Range Comment   Glucose-Capillary 81  70 - 99 mg/dL    Comment 1 Notify RN     GLUCOSE, CAPILLARY     Status: Abnormal   Collection Time   07/03/12 12:03 PM      Component Value Range Comment   Glucose-Capillary 154 (*) 70 - 99 mg/dL    Comment 1 Notify RN     GLUCOSE, CAPILLARY     Status: Abnormal   Collection Time   07/03/12  4:47 PM      Component Value Range Comment   Glucose-Capillary 68 (*) 70 - 99 mg/dL   GLUCOSE, CAPILLARY     Status: Normal   Collection Time   07/03/12  5:14 PM      Component Value Range Comment   Glucose-Capillary 75  70 - 99 mg/dL    Comment 1 Documented in Chart     GLUCOSE, CAPILLARY     Status: Abnormal   Collection Time   07/03/12  9:31 PM      Component Value Range Comment   Glucose-Capillary 128 (*) 70 - 99 mg/dL   PROTIME-INR     Status: Abnormal   Collection Time   07/04/12  6:05 AM       Component Value Range Comment   Prothrombin Time 21.4 (*) 11.6 - 15.2 seconds    INR 1.94 (*) 0.00 - 1.49   CBC     Status: Abnormal   Collection Time   07/04/12  6:05 AM      Component Value Range Comment   WBC 5.7  4.0 - 10.5 K/uL    RBC 2.66 (*) 3.87 - 5.11 MIL/uL    Hemoglobin 7.5 (*) 12.0 - 15.0 g/dL    HCT 65.7 (*) 84.6 - 46.0 %    MCV 90.2  78.0 - 100.0 fL    MCH 28.2  26.0 - 34.0 pg    MCHC 31.3  30.0 - 36.0 g/dL    RDW 96.2 (*) 95.2 - 15.5 %    Platelets 198  150 - 400 K/uL      HEENT: normal Cardio: RRR and IRIR, posititive murmur Resp: CTA B/L GI: BS positive Extremity:  No Edema Skin:   Left BK wound clean and intact. Neuro: Alert/Oriented and Normal Sensory Musc/Skel:  Normal and Other L-BKA with compressive dressing. Good rom except lacking a few degrees in extension at the left knee Gen:  NAD   Assessment/Plan: 1. Functional deficits secondary to L BKA which require 3+ hours per day of interdisciplinary therapy in a comprehensive inpatient rehab setting. Physiatrist is providing close team supervision and 24 hour management of active medical problems listed below. Physiatrist and rehab team continue to assess barriers to discharge/monitor patient progress toward functional and medical goals.    FIM: FIM - Bathing Bathing Steps Patient Completed: Chest;Right Arm;Left Arm;Abdomen;Front perineal area;Buttocks;Right upper leg;Left upper leg;Right lower leg (including foot) Bathing: 5: Supervision: Safety issues/verbal cues  FIM - Upper Body Dressing/Undressing Upper body  dressing/undressing steps patient completed: Thread/unthread right sleeve of pullover shirt/dresss;Thread/unthread left sleeve of pullover shirt/dress;Put head through opening of pull over shirt/dress;Pull shirt over trunk Upper body dressing/undressing: 5: Set-up assist to: Obtain clothing/put away FIM - Lower Body Dressing/Undressing Lower body dressing/undressing steps patient completed:  Thread/unthread right underwear leg;Thread/unthread left underwear leg;Pull underwear up/down;Thread/unthread right pants leg;Thread/unthread left pants leg;Pull pants up/down;Don/Doff right sock;Don/Doff right shoe Lower body dressing/undressing: 4: Steadying Assist  FIM - Toileting Toileting steps completed by patient: Performs perineal hygiene Toileting Assistive Devices: Grab bar or rail for support Toileting: 0: Activity did not occur  FIM - Diplomatic Services operational officer Devices: Elevated toilet seat Toilet Transfers: 5-To toilet/BSC: Supervision (verbal cues/safety issues);5-From toilet/BSC: Supervision (verbal cues/safety issues)  FIM - Press photographer Assistive Devices: Arm rests Bed/Chair Transfer: 6: Supine > Sit: No assist;6: Sit > Supine: No assist;5: Bed > Chair or W/C: Supervision (verbal cues/safety issues);5: Chair or W/C > Bed: Supervision (verbal cues/safety issues)  FIM - Locomotion: Wheelchair Distance: 150 Locomotion: Wheelchair: 6: Travels 150 ft or more, turns around, maneuvers to table, bed or toilet, negotiates 3% grade: maneuvers on rugs and over door sills independently (in controlled environment) FIM - Locomotion: Ambulation Locomotion: Ambulation Assistive Devices: Designer, industrial/product Ambulation/Gait Assistance: 3: Mod assist Locomotion: Ambulation: 0: Activity did not occur  Comprehension Comprehension Mode: Auditory Comprehension: 6-Follows complex conversation/direction: With extra time/assistive device  Expression Expression Mode: Verbal Expression: 6-Expresses complex ideas: With extra time/assistive device  Social Interaction Social Interaction: 6-Interacts appropriately with others with medication or extra time (anti-anxiety, antidepressant).  Problem Solving Problem Solving: 6-Solves complex problems: With extra time  Memory Memory: 6-More than reasonable amt of time  Medical Problem List and Plan:  1. DVT  Prophylaxis/Anticoagulation: Pharmaceutical: Coumadin  2. Pain Management: prn medications effective.   - gabapentin 400mg  TID for phantom limb pain which has helped. Remains alert  -discussed massage and visual feedback for this limb also  -prn tylenol hs 3. Mood: Has a good outlook overall. LCSW to follow up for formal evaluation.  4. Neuropsych: This patient is capable of making decisions on his/her own behalf.  5. DM type 2: monitor with AC/HS CBG checks. Continue metformin and amaryl daily. Use SSI for elevated BS.  6. Atrial Fibrillation: Monitor HR with bid checks. Needs to have HR checked apically. Continue coumadin.  7. COPD: continue spirivia for symptom management 8. HTN: Will monitor with bid checks.   Continue HCTZ, lasix and lisinopril.    9. ABLA:  Iron supplement added.   hgb 7.8 last week. Recheck tomorrow   LOS (Days) 10 A FACE TO FACE EVALUATION WAS PERFORMED  [name],[name] 07/04/2012, 7:57 AM

## 2012-07-05 DIAGNOSIS — S88119A Complete traumatic amputation at level between knee and ankle, unspecified lower leg, initial encounter: Secondary | ICD-10-CM

## 2012-07-05 DIAGNOSIS — I739 Peripheral vascular disease, unspecified: Secondary | ICD-10-CM

## 2012-07-05 DIAGNOSIS — L98499 Non-pressure chronic ulcer of skin of other sites with unspecified severity: Secondary | ICD-10-CM

## 2012-07-05 LAB — PROTIME-INR
INR: 2.14 — ABNORMAL HIGH (ref 0.00–1.49)
Prothrombin Time: 23 seconds — ABNORMAL HIGH (ref 11.6–15.2)

## 2012-07-05 MED ORDER — BENEPROTEIN PO POWD: 1.0000 | Freq: Three times a day (TID) | ORAL | Status: DC

## 2012-07-05 MED ORDER — WARFARIN SODIUM 4 MG PO TABS: 4.0000 mg | ORAL_TABLET | Freq: Every day | ORAL | Status: DC

## 2012-07-05 MED ORDER — FERROUS FUM-IRON POLYSACCH 162-115.2 MG PO CAPS: 1.0000 | ORAL_CAPSULE | Freq: Every day | ORAL | Status: DC

## 2012-07-05 MED ORDER — ACETAMINOPHEN 325 MG PO TABS: 325.0000 mg | ORAL_TABLET | ORAL | Status: DC | PRN

## 2012-07-05 MED ORDER — TRAZODONE 25 MG HALF TABLET: 25.0000 mg | ORAL_TABLET | Freq: Every day | ORAL | Status: DC

## 2012-07-05 MED ORDER — WARFARIN SODIUM 2 MG PO TABS
2.0000 mg | ORAL_TABLET | Freq: Once | ORAL | Status: DC
  Filled 2012-07-05: qty 1

## 2012-07-05 MED ORDER — GABAPENTIN 400 MG PO CAPS: 400.0000 mg | ORAL_CAPSULE | Freq: Two times a day (BID) | ORAL | Status: DC

## 2012-07-05 MED ORDER — METHOCARBAMOL 500 MG PO TABS: 500.0000 mg | ORAL_TABLET | Freq: Four times a day (QID) | ORAL | Status: DC | PRN

## 2012-07-05 NOTE — Progress Notes (Signed)
ANTICOAGULATION CONSULT NOTE - Follow Up Consult  Pharmacy Consult:  Coumadin Indication:  History of atrial fibrillation + VTE prophylaxis  Allergies  Allergen Reactions  . Other     Narcotics=makes her very disoriented  . Percocet (Oxycodone-Acetaminophen) Other (See Comments)    Dilerium, hallucinations  . Tramadol Anxiety  . Vicodin (Hydrocodone-Acetaminophen) Anxiety   Patient Measurements: Weight: 130 lb 14.4 oz (59.376 kg)  Vital Signs: Temp: 97.5 F (36.4 C) (11/22 0432) Temp src: Oral (11/22 0432) BP: 121/82 mmHg (11/22 0432) Pulse Rate: 62  (11/22 0432)  Labs:  Basename 07/05/12 0618 07/04/12 0605 07/03/12 0500  HGB -- 7.5* --  HCT -- 24.0* --  PLT -- 198 --  APTT -- -- --  LABPROT 23.0* 21.4* 21.5*  INR 2.14* 1.94* 1.95*  HEPARINUNFRC -- -- --  CREATININE -- -- --  CKTOTAL -- -- --  CKMB -- -- --  TROPONINI -- -- --   Assessment: 63 YOF continues on Coumadin for history of atrial fibrillation and for VTE prophylaxis s/p left transtibial amputation.  INR therapeutic.  No bleeding reported although she is anemic with low H/H.  Her platelets remain stable at 198K.    Goal of Therapy:  INR 2-3 Monitor platelets by anticoagulation protocol: Yes   Plan:  - Coumadin 2mg  PO today - Daily PT / INR - Monitor for s/s of bleeding with low H/h  Talbert Cage, PharmD. Clinical Pharmacist Pager:  [phone] Thank you for allowing pharmacy to be part of this patients care team. 07/05/2012, 10:42 AM

## 2012-07-05 NOTE — Progress Notes (Signed)
Patient ID: Sydney [name], female   DOB: [birth year], 78 y.o.   MRN: [id number]    Subjective/Complaints:  Phantom pain at times. Excited to go home   Review of Systems  Musculoskeletal: Positive for joint pain.  Neurological: Positive for sensory change.  All other systems reviewed and are negative.     Objective: Vital Signs: Blood pressure 121/82, pulse 62, temperature 97.5 F (36.4 C), temperature source Oral, resp. rate 19, weight 59.376 kg (130 lb 14.4 oz), SpO2 97.00%. No results found. Results for orders placed during the hospital encounter of 06/24/12 (from the past 72 hour(s))  GLUCOSE, CAPILLARY     Status: Abnormal   Collection Time   07/02/12 11:19 AM      Component Value Range Comment   Glucose-Capillary 117 (*) 70 - 99 mg/dL    Comment 1 Notify RN     GLUCOSE, CAPILLARY     Status: Abnormal   Collection Time   07/02/12  4:20 PM      Component Value Range Comment   Glucose-Capillary 116 (*) 70 - 99 mg/dL    Comment 1 Notify RN     GLUCOSE, CAPILLARY     Status: Abnormal   Collection Time   07/02/12  9:51 PM      Component Value Range Comment   Glucose-Capillary 158 (*) 70 - 99 mg/dL   PROTIME-INR     Status: Abnormal   Collection Time   07/03/12  5:00 AM      Component Value Range Comment   Prothrombin Time 21.5 (*) 11.6 - 15.2 seconds    INR 1.95 (*) 0.00 - 1.49   GLUCOSE, CAPILLARY     Status: Normal   Collection Time   07/03/12  7:39 AM      Component Value Range Comment   Glucose-Capillary 81  70 - 99 mg/dL    Comment 1 Notify RN     GLUCOSE, CAPILLARY     Status: Abnormal   Collection Time   07/03/12 12:03 PM      Component Value Range Comment   Glucose-Capillary 154 (*) 70 - 99 mg/dL    Comment 1 Notify RN     GLUCOSE, CAPILLARY     Status: Abnormal   Collection Time   07/03/12  4:47 PM      Component Value Range Comment   Glucose-Capillary 68 (*) 70 - 99 mg/dL   GLUCOSE, CAPILLARY     Status: Normal   Collection Time   07/03/12  5:14 PM        Component Value Range Comment   Glucose-Capillary 75  70 - 99 mg/dL    Comment 1 Documented in Chart     GLUCOSE, CAPILLARY     Status: Abnormal   Collection Time   07/03/12  9:31 PM      Component Value Range Comment   Glucose-Capillary 128 (*) 70 - 99 mg/dL   PROTIME-INR     Status: Abnormal   Collection Time   07/04/12  6:05 AM      Component Value Range Comment   Prothrombin Time 21.4 (*) 11.6 - 15.2 seconds    INR 1.94 (*) 0.00 - 1.49   CBC     Status: Abnormal   Collection Time   07/04/12  6:05 AM      Component Value Range Comment   WBC 5.7  4.0 - 10.5 K/uL    RBC 2.66 (*) 3.87 - 5.11 MIL/uL    Hemoglobin 7.5 (*)  12.0 - 15.0 g/dL    HCT 11.9 (*) 14.7 - 46.0 %    MCV 90.2  78.0 - 100.0 fL    MCH 28.2  26.0 - 34.0 pg    MCHC 31.3  30.0 - 36.0 g/dL    RDW 82.9 (*) 56.2 - 15.5 %    Platelets 198  150 - 400 K/uL   GLUCOSE, CAPILLARY     Status: Abnormal   Collection Time   07/04/12  8:56 AM      Component Value Range Comment   Glucose-Capillary 133 (*) 70 - 99 mg/dL   GLUCOSE, CAPILLARY     Status: Abnormal   Collection Time   07/04/12 11:32 AM      Component Value Range Comment   Glucose-Capillary 131 (*) 70 - 99 mg/dL    Comment 1 Notify RN     GLUCOSE, CAPILLARY     Status: Abnormal   Collection Time   07/04/12  4:44 PM      Component Value Range Comment   Glucose-Capillary 113 (*) 70 - 99 mg/dL    Comment 1 Notify RN      Comment 2 Documented in Chart     GLUCOSE, CAPILLARY     Status: Abnormal   Collection Time   07/04/12  8:37 PM      Component Value Range Comment   Glucose-Capillary 168 (*) 70 - 99 mg/dL    Comment 1 Documented in Chart      Comment 2 Notify RN     PROTIME-INR     Status: Abnormal   Collection Time   07/05/12  6:18 AM      Component Value Range Comment   Prothrombin Time 23.0 (*) 11.6 - 15.2 seconds    INR 2.14 (*) 0.00 - 1.49   GLUCOSE, CAPILLARY     Status: Normal   Collection Time   07/05/12  7:31 AM      Component Value  Range Comment   Glucose-Capillary 98  70 - 99 mg/dL      HEENT: normal Cardio: RRR and IRIR, posititive murmur Resp: CTA B/L GI: BS positive Extremity:  No Edema Skin:   Left BK wound clean and intact. Neuro: Alert/Oriented and Normal Sensory Musc/Skel:  Normal and Other L-BKA with compressive dressing. Good rom except lacking a few degrees in extension at the left knee Gen:  NAD   Assessment/Plan: 1. Functional deficits secondary to L BKA which require 3+ hours per day of interdisciplinary therapy in a comprehensive inpatient rehab setting. Physiatrist is providing close team supervision and 24 hour management of active medical problems listed below. Physiatrist and rehab team continue to assess barriers to discharge/monitor patient progress toward functional and medical goals.   Home today. See me in 3-4 weeks   FIM: FIM - Bathing Bathing Steps Patient Completed: Chest;Right Arm;Left Arm;Abdomen;Front perineal area;Buttocks;Right upper leg;Left upper leg;Right lower leg (including foot) Bathing: 5: Supervision: Safety issues/verbal cues  FIM - Upper Body Dressing/Undressing Upper body dressing/undressing steps patient completed: Thread/unthread right sleeve of pullover shirt/dresss;Thread/unthread left sleeve of pullover shirt/dress;Put head through opening of pull over shirt/dress;Pull shirt over trunk Upper body dressing/undressing: 5: Set-up assist to: Obtain clothing/put away FIM - Lower Body Dressing/Undressing Lower body dressing/undressing steps patient completed: Thread/unthread right underwear leg;Thread/unthread left underwear leg;Pull underwear up/down;Thread/unthread right pants leg;Thread/unthread left pants leg;Pull pants up/down;Don/Doff right sock;Don/Doff right shoe Lower body dressing/undressing: 4: Steadying Assist (in standing, supervision during lateral leans)  FIM - Toileting Toileting steps completed  by patient: Performs perineal hygiene Toileting  Assistive Devices: Grab bar or rail for support Toileting: 3: Mod-Patient completed 2 of 3 steps  FIM - Diplomatic Services operational officer Devices: Elevated toilet seat Toilet Transfers: 0-Activity did not occur  FIM - Banker Devices: Arm rests Bed/Chair Transfer: 6: Supine > Sit: No assist;6: Sit > Supine: No assist;5: Bed > Chair or W/C: Supervision (verbal cues/safety issues);5: Chair or W/C > Bed: Supervision (verbal cues/safety issues)  FIM - Locomotion: Wheelchair Distance: 150 Locomotion: Wheelchair: 6: Travels 150 ft or more, turns around, maneuvers to table, bed or toilet, negotiates 3% grade: maneuvers on rugs and over door sills independently FIM - Locomotion: Ambulation Locomotion: Ambulation Assistive Devices: Designer, industrial/product Ambulation/Gait Assistance: 4: Min assist;3: Mod assist Locomotion: Ambulation: 1: Travels less than 50 ft with minimal assistance (Pt.>75%)  Comprehension Comprehension Mode: Auditory Comprehension: 6-Follows complex conversation/direction: With extra time/assistive device  Expression Expression Mode: Verbal Expression: 6-Expresses complex ideas: With extra time/assistive device  Social Interaction Social Interaction: 6-Interacts appropriately with others with medication or extra time (anti-anxiety, antidepressant).  Problem Solving Problem Solving: 5-Solves complex 90% of the time/cues < 10% of the time  Memory Memory: 6-More than reasonable amt of time  Medical Problem List and Plan:  1. DVT Prophylaxis/Anticoagulation: Pharmaceutical: Coumadin  2. Pain Management: prn medications effective.   - gabapentin 400mg  TID for phantom limb pain which has helped. Remains alert  -discussed massage and visual feedback for this limb also  -prn tylenol hs 3. Mood: Has a good outlook overall. LCSW to follow up for formal evaluation.  4. Neuropsych: This patient is capable of making decisions on  his/her own behalf.  5. DM type 2: monitor with AC/HS CBG checks. Continue metformin and amaryl daily. Use SSI for elevated BS.  6. Atrial Fibrillation: Monitor HR with bid checks. Needs to have HR checked apically. Continue coumadin.  7. COPD: continue spirivia for symptom management 8. HTN: Will monitor with bid checks.   Continue HCTZ, lasix and lisinopril.    9. ABLA:  Iron supplement added.   hgb 7.8 last week. Recheck tomorrow   LOS (Days) 11 A FACE TO FACE EVALUATION WAS PERFORMED  Dexter Sauser T 07/05/2012, 8:41 AM

## 2012-07-05 NOTE — Progress Notes (Signed)
Social Work  Discharge Note  The overall goal for the admission was met for:   Discharge location: Yes - home with daughter and son-in-law to provide 24/7 assistance  Length of Stay: Yes - 11 days  Discharge activity level: Yes - supervision to minimal assistance  Home/community participation: Yes  Services provided included: MD, RD, PT, OT, RN, TR, Pharmacy and SW  Financial Services: Medicare and Private Insurance: BCBS  Follow-up services arranged: Home Health: [facility], PT, OT via [facility], DME: [phone] Breezy w/c with left amputee support pad and back cushion, basic seat cushion, drop arm commode and tub bench via [facility] and Patient/Family request agency HH: Genevieve Norlander, DME: no pref  Comments (or additional information): Pt also received peer visit from two members of local amputee support group while here on CIR  Patient/Family verbalized understanding of follow-up arrangements: Yes  Individual responsible for coordination of the follow-up plan: patient and daughter  Confirmed correct DME delivered: Amada Jupiter 07/05/2012    Soriya Worster

## 2012-07-05 NOTE — Progress Notes (Signed)
Pt discharged to home with daughter via wheelchair.  Discharge instructions reviewed with patient and daughter by Delle Reining, PA.  Left BKA surgical stump redressed and education given to family and patient.  Flu vaccine given prior to discharge.  Assessment unchanged since am.  No further needs at this time.  Harlan Stains [place]

## 2012-07-06 NOTE — Discharge Summary (Signed)
Sydney [name]:  [id number]  MEDICAL RECORD NO.:  [id number]  LOCATION:  4038                         FACILITY:  [facility]  PHYSICIAN:  Delle Reining, P.A.      DATE OF BIRTH:  Jun 05, 1921  DATE OF ADMISSION:  06/24/2012 DATE OF DISCHARGE:  07/05/2012                              DISCHARGE SUMMARY   DATE OF DISCHARGE:  To home on July 05, 2012.  DISCHARGE DIAGNOSIS: 1. Left below-knee amputation. 2. Diabetes mellitus type 2. 3. Chronic obstructive pulmonary disease. 4. Neuropathy. 5. Hypertension. 6. Acute blood loss anemia.  HISTORY OF PRESENT ILLNESS:  Ms. Sydney [name] is an 78 year old female with history of AFib, chronic Coumadin, peripheral vascular disease, gangrenous changes in the left foot with chronic osteomyelitis.  She was admitted on June 21, 2012, for left BKA by Dr. Lajoyce Corners.  Postop chronic Coumadin has been resumed.  She has had acute on chronic anemia with hemoglobin at 8.3.  Therapies were initiated and CIR was recommended for progression.  PAST MEDICAL HISTORY:  Neuropathy, hypercholesterolemia, anemia, COPD, hypertension, chronic AFib, CHF, OA, history of bladder infections, diabetes mellitus type 2, macular degeneration, and hypothyroid.  FUNCTIONAL HISTORY:  The patient has been sedentary for last few months due to infection since January.  She has had limited mobility.  FUNCTIONAL STATUS:  The patient has +2 total assist 80% for bed mobility, +2 total assist 80% for transfers, +2 total assist 40% for ambulating, 10+2+3 feet with rolling walker.  LABORATORY DATA:  Recent labs, CBC from July 04, 2012, reveal hemoglobin 7.5, hematocrit 24.0, white count 5.7, platelets 198.  Check of lytes from June 25, 2012, revealed sodium 137, potassium 3.7, chloride 99, CO2 of 27, BUN 19, creatinine 0.88, glucose 112.  PT/INR at the time of discharge is therapeutic at 23.0 and 2.14.  HOSPITAL COURSE:  This patient was admitted to  Rehab on June 24, 2012 for inpatient therapy to consist of PT, OT at least 3 hours 5 days a week.  Past admission, physiatrist, rehab RN, and therapy team have worked together to provide customized collaborative interdisciplinary care.  Rehab RN has worked with the patient's bowel and bladder program as well as wound care monitoring.  The patient's blood pressures were checked on b.i.d. basis and these have been reasonably controlled. Blood sugars were monitored on before meal and at bedtime basis and are ranging from 90s to 130 range overall with an occasional high and 160s. The patient's iron supplements were increased to b.i.d. basis and H and H has been monitored along.  This has shown slight drop to 7.5; however, the patient has been asymptomatic.  The patient's BKA site is healing well without any signs or symptoms of infection.  Staples are intact. She has had problems with neuropathy.  Neurontin was slowly titrated to 400 mg in a.m. and 800 mg at bedtime due to significant amount of pain that occurs at night.  Tylenol has been used on p.r.n. basis for pain management.  The patient's p.o. intake has been good.  She has been afebrile during this stay.  The patient was educated on desensitization technique to help with pain management.  She has had some insomnia due to pain at night and was started on low-dose trazodone.  During the patient's stay in rehab, weekly team conferences were held to monitor patient's progress, set goals as well as discuss barriers to discharge.  Physical therapy has worked with patient on activity tolerance, balance, as well as strengthening.  The patient is at modified independent level for dynamic sitting.  She requires min assist for static standing.  She is unable to consistently ambulate 10 feet with min assist as she still requires mod assist at this time due to poor foot clearance.  OT has worked with the patient on self-care tasks.  They have  worked on activity tolerance as well as lateral lean for pericare including management.  The patient is at supervision to steady min assist for bathing and dressing tasks.  The patient's daughter is very supportive and has been present for multiple therapy sessions.  She will provide supervision and min assist as needed past discharge.  On July 05, 2012, the patient is discharged to home.  DISCHARGE MEDICATIONS:  Tylenol 325-650 mg p.o. every 4 hours p.r.n. pain, Robaxin 500 mg p.o. every 6 hours p.r.n. spasm, protein powder 6 g t.i.d. with meals, trazodone 25 mg p.o. at bedtime, Neurontin 400 mg in a.m. and 800 mg in p.m., Coumadin 4 mg p.o. q.p.m., calcium supplements daily, vitamin D 500 mcg p.o. per day, ferrous fumarate one p.o. b.i.d., Lasix 20 mg p.o. per day, folic acid 1 mg p.o. per day, glimepiride 2 mg p.o. per day, Icaps one p.o. per day, Synthroid 50 mcg p.o. per day, Prinzide 20-25 one p.o. per day, metformin 500 mg p.o. per day, methotrexate can be resumed starting next Tuesday, multivitamin one p.o. per day, Spiriva 18 mcg inhaled daily, vitamin D 400 units p.o. per day.  DIET:  Carb modified medium.  ACTIVITY LEVEL:  As tolerated at wheelchair level with 24-hour supervision. wound care, wash with soap and water, pad dry, and apply compressive dressing.  SPECIAL INSTRUCTIONS:  Work on desensitizing wound, [facility] to provide PT, OT, and RN.  Next Pro-time drawn on Monday with results to Dr. Jacky Kindle.  Check blood sugars at least b.i.d. basis.  Keep amputated site as straight as possible with shoes when out of bed.  Do not use doxycycline or Nitro-Dur patch.  FOLLOWUP:  The patient to follow up with Dr. Aldean Baker for postop check in the next 2 weeks.  Follow up with Dr. Thersa Salt on July 24, 2012, at 9:50 a.m.  Follow up with Dr. Jacky Kindle on July 15, 2012, at 11 a.m.     Delle Reining, P.A.     PL/MEDQ  D:  07/05/2012  T:  07/06/2012  Job:   [id number]  cc:   Nadara Mustard, MD Geoffry Paradise, M.

## 2012-07-07 DIAGNOSIS — M86679 Other chronic osteomyelitis, unspecified ankle and foot: Secondary | ICD-10-CM | POA: Diagnosis not present

## 2012-07-07 DIAGNOSIS — E1142 Type 2 diabetes mellitus with diabetic polyneuropathy: Secondary | ICD-10-CM | POA: Diagnosis not present

## 2012-07-07 DIAGNOSIS — J441 Chronic obstructive pulmonary disease with (acute) exacerbation: Secondary | ICD-10-CM | POA: Diagnosis not present

## 2012-07-07 DIAGNOSIS — E1149 Type 2 diabetes mellitus with other diabetic neurological complication: Secondary | ICD-10-CM | POA: Diagnosis not present

## 2012-07-07 DIAGNOSIS — I70269 Atherosclerosis of native arteries of extremities with gangrene, unspecified extremity: Secondary | ICD-10-CM | POA: Diagnosis not present

## 2012-07-07 DIAGNOSIS — L97409 Non-pressure chronic ulcer of unspecified heel and midfoot with unspecified severity: Secondary | ICD-10-CM | POA: Diagnosis not present

## 2012-07-07 DIAGNOSIS — Z48817 Encounter for surgical aftercare following surgery on the skin and subcutaneous tissue: Secondary | ICD-10-CM | POA: Diagnosis not present

## 2012-07-07 DIAGNOSIS — S88119A Complete traumatic amputation at level between knee and ankle, unspecified lower leg, initial encounter: Secondary | ICD-10-CM | POA: Diagnosis not present

## 2012-07-07 DIAGNOSIS — M069 Rheumatoid arthritis, unspecified: Secondary | ICD-10-CM | POA: Diagnosis not present

## 2012-07-24 ENCOUNTER — Encounter: Payer: Medicare Other | Attending: Physical Medicine & Rehabilitation | Admitting: Physical Medicine & Rehabilitation

## 2012-07-24 ENCOUNTER — Encounter: Payer: Self-pay | Admitting: Physical Medicine & Rehabilitation

## 2012-07-24 VITALS — BP 111/35 | HR 60 | Resp 14 | Ht 65.0 in | Wt 130.0 lb

## 2012-07-24 DIAGNOSIS — E1142 Type 2 diabetes mellitus with diabetic polyneuropathy: Secondary | ICD-10-CM | POA: Diagnosis not present

## 2012-07-24 DIAGNOSIS — G547 Phantom limb syndrome without pain: Secondary | ICD-10-CM | POA: Diagnosis not present

## 2012-07-24 DIAGNOSIS — E7800 Pure hypercholesterolemia, unspecified: Secondary | ICD-10-CM | POA: Diagnosis not present

## 2012-07-24 DIAGNOSIS — Z79899 Other long term (current) drug therapy: Secondary | ICD-10-CM | POA: Insufficient documentation

## 2012-07-24 DIAGNOSIS — I4891 Unspecified atrial fibrillation: Secondary | ICD-10-CM | POA: Diagnosis not present

## 2012-07-24 DIAGNOSIS — I739 Peripheral vascular disease, unspecified: Secondary | ICD-10-CM | POA: Diagnosis not present

## 2012-07-24 DIAGNOSIS — E119 Type 2 diabetes mellitus without complications: Secondary | ICD-10-CM | POA: Insufficient documentation

## 2012-07-24 DIAGNOSIS — E1140 Type 2 diabetes mellitus with diabetic neuropathy, unspecified: Secondary | ICD-10-CM | POA: Insufficient documentation

## 2012-07-24 DIAGNOSIS — S88119A Complete traumatic amputation at level between knee and ankle, unspecified lower leg, initial encounter: Secondary | ICD-10-CM | POA: Diagnosis not present

## 2012-07-24 DIAGNOSIS — G546 Phantom limb syndrome with pain: Secondary | ICD-10-CM | POA: Insufficient documentation

## 2012-07-24 DIAGNOSIS — I10 Essential (primary) hypertension: Secondary | ICD-10-CM | POA: Insufficient documentation

## 2012-07-24 DIAGNOSIS — J438 Other emphysema: Secondary | ICD-10-CM | POA: Diagnosis not present

## 2012-07-24 DIAGNOSIS — Z7901 Long term (current) use of anticoagulants: Secondary | ICD-10-CM | POA: Insufficient documentation

## 2012-07-24 DIAGNOSIS — E039 Hypothyroidism, unspecified: Secondary | ICD-10-CM | POA: Diagnosis not present

## 2012-07-24 DIAGNOSIS — G609 Hereditary and idiopathic neuropathy, unspecified: Secondary | ICD-10-CM | POA: Insufficient documentation

## 2012-07-24 DIAGNOSIS — E1149 Type 2 diabetes mellitus with other diabetic neurological complication: Secondary | ICD-10-CM

## 2012-07-24 NOTE — Progress Notes (Signed)
Subjective:    Patient ID: Sydney [name], female    DOB: 12-24-1920, 78 y.o.   MRN: [id number]  HPI  Mrs. Wnek is here in follow up of her left BKA. She has been home working with PT and OT practicing standing, transferring. She has hopped a couple times with therapy. She is doing stand pivot transfers.  Her phantom limb pain is improving. She takes 400mg  of neurontin in the am and 800mg  at night.  She has DPN pain in her right foot which has improved with neurontin.  She has seen her surgeon recently who removed the staples. She continues to wear an ACE wrap along with kerlix to protect and shrink the limb.  Overall she is feeling quite well.   Pain Inventory Average Pain 2 Pain Right Now 4 My pain is intermittent  In the last 24 hours, has pain interfered with the following? General activity 7 Relation with others 3 Enjoyment of life 3 What TIME of day is your pain at its worst? evening Sleep (in general) Good  Pain is worse with: walking and standing Pain improves with: pacing activities Relief from Meds: n/a  Mobility walk with assistance how many minutes can you walk? 0 ability to climb steps?  no do you drive?  no use a wheelchair needs help with transfers  Function not employed: date last employed   Neuro/Psych tingling trouble walking  Prior Studies Any changes since last visit?  no  Physicians involved in your care Any changes since last visit?  no   Family History  Problem Relation Age of Onset  . Heart disease Mother   . Stroke Mother   . Heart disease Father   . Stroke Father   . Heart attack Father   . Muscular dystrophy Brother   . Anesthesia problems Neg Hx   . Hypotension Neg Hx   . Malignant hyperthermia Neg Hx   . Pseudochol deficiency Neg Hx    History   Social History  . Marital Status: Widowed    Spouse Name: N/A    Number of Children: N/A  . Years of Education: N/A   Social History Main Topics  . Smoking status: Former  Smoker    Types: Cigarettes  . Smokeless tobacco: Former Neurosurgeon    Quit date: 01/01/1981  . Alcohol Use: No  . Drug Use: No  . Sexually Active: No   Other Topics Concern  . None   Social History Narrative  . None   Past Surgical History  Procedure Date  . Total hip arthroplasty     LEFT HIP  . Back surgery   . Fracture surgery 2011    LEFT HIP FX /REPLACEMENT   . Joint replacement     HIP LEFT   . Cholecystectomy 1951  . Dilation and curettage of uterus   . Abdominal hysterectomy 1975  . Amputation 11/09/2011    Procedure: AMPUTATION DIGIT;  Surgeon: Kathryne Hitch, MD;  Location: [facility] OR;  Service: Orthopedics;  Laterality: Right;  Right Great Toe Amputation  . Eye surgery     bilateral -cataract surgery  . Amputation 01/05/2012    Procedure: AMPUTATION RAY;  Surgeon: Kathryne Hitch, MD;  Location: [facility] ORS;  Service: Orthopedics;  Laterality: Left;  Amputation Left 4th and 5th Toes  . Cardioversion   . Amputation 05/31/2012    Procedure: AMPUTATION RAY;  Surgeon: Nadara Mustard, MD;  Location[facility];  Service: Orthopedics;  Laterality: Left;  Left foot  1st ray amp  . Amputation 06/21/2012    Procedure: AMPUTATION BELOW KNEE;  Surgeon: Nadara Mustard, MD;  Location: [facility] OR;  Service: Orthopedics;  Laterality: Left;  Left Below Knee Amputation   Past Medical History  Diagnosis Date  . Lung disease     BRONCHOSPASTIC  . Neuropathy     takes Gabapentin bid  . Hypercholesterolemia     doesn't require meds for this  . Anemia   . COPD (chronic obstructive pulmonary disease)   . Blood transfusion 2012 ?APRIL  . Hypertension     takes Prinizide daily  . Chronic atrial fibrillation     takes Coumadin as instructed  . Peripheral vascular disease   . CHF (congestive heart failure)     takes Lasix every other day  . Shortness of breath     uses spiriva daily;with exertion  . Emphysema   . Pneumonia     hx of   . History of head injury     many yrs ago  .  Arthritis     rheumatoid and takes Methotrexate weekly  . Bruises easily   . Open wound     with a foul odor-left big toe  . History of bladder infections   . Vaginal discharge     has seen GYN but says its nothing to worry about  . History of blood transfusion   . Diabetes mellitus     takes Metformin and Amaryl daily  . Hypothyroidism     takes Synthroid daily  . Macular degeneration     dry   BP 111/35  Pulse 60  Resp 14  Ht 5\' 5"  (1.651 m)  Wt 130 lb (58.968 kg)  BMI 21.63 kg/m2     Review of Systems  Musculoskeletal: Positive for myalgias, arthralgias and gait problem.  All other systems reviewed and are negative.       Objective:   Physical Exam  General: Alert and oriented x 3, No apparent distress HEENT: Head is normocephalic, atraumatic, PERRLA, EOMI, sclera anicteric, oral mucosa pink and moist, dentition intact, ext ear canals clear,  Neck: Supple without JVD or lymphadenopathy Heart: Reg rate and rhythm. No murmurs rubs or gallops Chest: CTA bilaterally without wheezes, rales, or rhonchi; no distress Abdomen: Soft, non-tender, non-distended, bowel sounds positive. Extremities: No clubbing, cyanosis, or edema. Pulses are 2+ Skin: BK incision is clean with scabbing. All staples out. No drainage. Distal end remains tender. Neuro: Pt is cognitively appropriate with normal insight, memory, and awareness. Cranial nerves 2-12 are intact. Sensory exam is diminished for LT distally in the right lower ext. Reflexes are 2+ in all 4's. Fine motor coordination is intact. No tremors. Motor function is grossly 5/5 in the uppers. She is 4/5 RLE and 3-4 left lower (pain) Musculoskeletal: she has a 10 degree flexion contracture at the left knee and a similar contracture in flexion at the left hip. Psych: Pt's affect is appropriate. Pt is cooperative         Assessment & Plan:  1. Left below-knee amputation.  2. Diabetes mellitus type 2.  3. Chronic obstructive  pulmonary disease.  4. Diabetic peripheral neuropathy 5. Phantom limb pain  Plan: 1. She is doing extremely well. She will be a K2 ambulator. She is motivated to use the leg for house hold activities, get into her car, cooking, etc. She is excited to begin therapies. A prosthetic prescription was prescribed. She will use [employer]. I would be happy to write  a therapy prescription when it's time to begin. She is probably about 2 weeks away from being ready for prosthetic fitting. Would continue with an ACE dressing or shrinker. Reviewed multiple exercises to address her flexion contractures. Therapy should work on these as well. 2. She should continue with her neurontin for now to control her phantom limb pain and DPN pain. I expect that she will be able to wean off the medication at least partially over the next few months. The phantom limb symptom should improve with weight bearing/socket wearing/etc. 3. Follow up with pcp as directed. 30 minutes of face to face patient care time were spent during this visit. All questions were encouraged and answered. See me back prn.

## 2012-07-24 NOTE — Patient Instructions (Signed)
Call me with questions.  Work on your knee extension and hip extension with regular stretches.

## 2012-08-03 DIAGNOSIS — E1159 Type 2 diabetes mellitus with other circulatory complications: Secondary | ICD-10-CM | POA: Diagnosis not present

## 2012-08-03 DIAGNOSIS — S88119A Complete traumatic amputation at level between knee and ankle, unspecified lower leg, initial encounter: Secondary | ICD-10-CM | POA: Diagnosis not present

## 2012-08-03 DIAGNOSIS — Z7901 Long term (current) use of anticoagulants: Secondary | ICD-10-CM | POA: Diagnosis not present

## 2012-08-03 DIAGNOSIS — L84 Corns and callosities: Secondary | ICD-10-CM | POA: Diagnosis not present

## 2012-08-26 DIAGNOSIS — M069 Rheumatoid arthritis, unspecified: Secondary | ICD-10-CM | POA: Diagnosis not present

## 2012-09-02 DIAGNOSIS — E1159 Type 2 diabetes mellitus with other circulatory complications: Secondary | ICD-10-CM | POA: Diagnosis not present

## 2012-09-02 DIAGNOSIS — S88119A Complete traumatic amputation at level between knee and ankle, unspecified lower leg, initial encounter: Secondary | ICD-10-CM | POA: Diagnosis not present

## 2012-09-02 DIAGNOSIS — Z111 Encounter for screening for respiratory tuberculosis: Secondary | ICD-10-CM | POA: Diagnosis not present

## 2012-09-02 DIAGNOSIS — Z7901 Long term (current) use of anticoagulants: Secondary | ICD-10-CM | POA: Diagnosis not present

## 2012-09-06 DIAGNOSIS — M25519 Pain in unspecified shoulder: Secondary | ICD-10-CM | POA: Diagnosis not present

## 2012-09-06 DIAGNOSIS — M75100 Unspecified rotator cuff tear or rupture of unspecified shoulder, not specified as traumatic: Secondary | ICD-10-CM | POA: Diagnosis not present

## 2012-09-19 DIAGNOSIS — I4891 Unspecified atrial fibrillation: Secondary | ICD-10-CM | POA: Diagnosis not present

## 2012-09-19 DIAGNOSIS — Z7901 Long term (current) use of anticoagulants: Secondary | ICD-10-CM | POA: Diagnosis not present

## 2012-10-01 ENCOUNTER — Ambulatory Visit: Payer: Medicare Other | Attending: Orthopedic Surgery | Admitting: Physical Therapy

## 2012-10-01 DIAGNOSIS — M6281 Muscle weakness (generalized): Secondary | ICD-10-CM | POA: Insufficient documentation

## 2012-10-01 DIAGNOSIS — IMO0001 Reserved for inherently not codable concepts without codable children: Secondary | ICD-10-CM | POA: Insufficient documentation

## 2012-10-01 DIAGNOSIS — R5381 Other malaise: Secondary | ICD-10-CM | POA: Insufficient documentation

## 2012-10-01 DIAGNOSIS — S88119A Complete traumatic amputation at level between knee and ankle, unspecified lower leg, initial encounter: Secondary | ICD-10-CM | POA: Insufficient documentation

## 2012-10-01 DIAGNOSIS — R269 Unspecified abnormalities of gait and mobility: Secondary | ICD-10-CM | POA: Diagnosis not present

## 2012-10-03 ENCOUNTER — Ambulatory Visit: Payer: Medicare Other | Admitting: Physical Therapy

## 2012-10-07 ENCOUNTER — Ambulatory Visit: Payer: Medicare Other | Admitting: Physical Therapy

## 2012-10-07 DIAGNOSIS — S88119A Complete traumatic amputation at level between knee and ankle, unspecified lower leg, initial encounter: Secondary | ICD-10-CM | POA: Diagnosis not present

## 2012-10-09 ENCOUNTER — Ambulatory Visit: Payer: Medicare Other | Admitting: Physical Therapy

## 2012-10-09 DIAGNOSIS — R269 Unspecified abnormalities of gait and mobility: Secondary | ICD-10-CM | POA: Diagnosis not present

## 2012-10-10 DIAGNOSIS — L97209 Non-pressure chronic ulcer of unspecified calf with unspecified severity: Secondary | ICD-10-CM | POA: Diagnosis not present

## 2012-10-15 ENCOUNTER — Encounter: Payer: Medicare Other | Admitting: Physical Therapy

## 2012-10-17 ENCOUNTER — Encounter: Payer: Medicare Other | Admitting: Physical Therapy

## 2012-10-17 DIAGNOSIS — I4891 Unspecified atrial fibrillation: Secondary | ICD-10-CM | POA: Diagnosis not present

## 2012-10-21 ENCOUNTER — Encounter: Payer: Medicare Other | Admitting: Physical Therapy

## 2012-10-23 ENCOUNTER — Encounter: Payer: Medicare Other | Admitting: Physical Therapy

## 2012-10-29 ENCOUNTER — Ambulatory Visit: Payer: Medicare Other | Admitting: Physical Therapy

## 2012-10-31 ENCOUNTER — Encounter: Payer: Medicare Other | Admitting: Physical Therapy

## 2012-11-05 ENCOUNTER — Encounter: Payer: Medicare Other | Admitting: Physical Therapy

## 2012-11-07 ENCOUNTER — Encounter: Payer: Medicare Other | Admitting: Physical Therapy

## 2012-11-12 ENCOUNTER — Encounter: Payer: Medicare Other | Admitting: Physical Therapy

## 2012-11-12 DIAGNOSIS — S88119A Complete traumatic amputation at level between knee and ankle, unspecified lower leg, initial encounter: Secondary | ICD-10-CM | POA: Diagnosis not present

## 2012-11-12 DIAGNOSIS — I70269 Atherosclerosis of native arteries of extremities with gangrene, unspecified extremity: Secondary | ICD-10-CM | POA: Diagnosis not present

## 2012-11-12 DIAGNOSIS — L97509 Non-pressure chronic ulcer of other part of unspecified foot with unspecified severity: Secondary | ICD-10-CM | POA: Diagnosis not present

## 2012-11-12 DIAGNOSIS — L97209 Non-pressure chronic ulcer of unspecified calf with unspecified severity: Secondary | ICD-10-CM | POA: Diagnosis not present

## 2012-11-14 ENCOUNTER — Encounter: Payer: Medicare Other | Admitting: Physical Therapy

## 2012-11-20 DIAGNOSIS — Z7901 Long term (current) use of anticoagulants: Secondary | ICD-10-CM | POA: Diagnosis not present

## 2012-11-20 DIAGNOSIS — I4891 Unspecified atrial fibrillation: Secondary | ICD-10-CM | POA: Diagnosis not present

## 2012-11-22 ENCOUNTER — Other Ambulatory Visit: Payer: Self-pay | Admitting: Physical Medicine & Rehabilitation

## 2012-12-02 DIAGNOSIS — I4891 Unspecified atrial fibrillation: Secondary | ICD-10-CM | POA: Diagnosis not present

## 2012-12-02 DIAGNOSIS — J449 Chronic obstructive pulmonary disease, unspecified: Secondary | ICD-10-CM | POA: Diagnosis not present

## 2012-12-02 DIAGNOSIS — Z1331 Encounter for screening for depression: Secondary | ICD-10-CM | POA: Diagnosis not present

## 2012-12-02 DIAGNOSIS — D649 Anemia, unspecified: Secondary | ICD-10-CM | POA: Diagnosis not present

## 2012-12-02 DIAGNOSIS — S88119A Complete traumatic amputation at level between knee and ankle, unspecified lower leg, initial encounter: Secondary | ICD-10-CM | POA: Diagnosis not present

## 2012-12-02 DIAGNOSIS — E039 Hypothyroidism, unspecified: Secondary | ICD-10-CM | POA: Diagnosis not present

## 2012-12-02 DIAGNOSIS — M1990 Unspecified osteoarthritis, unspecified site: Secondary | ICD-10-CM | POA: Diagnosis not present

## 2012-12-02 DIAGNOSIS — E1159 Type 2 diabetes mellitus with other circulatory complications: Secondary | ICD-10-CM | POA: Diagnosis not present

## 2012-12-02 DIAGNOSIS — I509 Heart failure, unspecified: Secondary | ICD-10-CM | POA: Diagnosis not present

## 2012-12-03 DIAGNOSIS — L97209 Non-pressure chronic ulcer of unspecified calf with unspecified severity: Secondary | ICD-10-CM | POA: Diagnosis not present

## 2012-12-03 DIAGNOSIS — I70269 Atherosclerosis of native arteries of extremities with gangrene, unspecified extremity: Secondary | ICD-10-CM | POA: Diagnosis not present

## 2012-12-19 DIAGNOSIS — I4891 Unspecified atrial fibrillation: Secondary | ICD-10-CM | POA: Diagnosis not present

## 2012-12-19 DIAGNOSIS — Z7901 Long term (current) use of anticoagulants: Secondary | ICD-10-CM | POA: Diagnosis not present

## 2012-12-23 DIAGNOSIS — L97209 Non-pressure chronic ulcer of unspecified calf with unspecified severity: Secondary | ICD-10-CM | POA: Diagnosis not present

## 2012-12-23 DIAGNOSIS — E1149 Type 2 diabetes mellitus with other diabetic neurological complication: Secondary | ICD-10-CM | POA: Diagnosis not present

## 2012-12-23 DIAGNOSIS — I70269 Atherosclerosis of native arteries of extremities with gangrene, unspecified extremity: Secondary | ICD-10-CM | POA: Diagnosis not present

## 2012-12-24 DIAGNOSIS — M069 Rheumatoid arthritis, unspecified: Secondary | ICD-10-CM | POA: Diagnosis not present

## 2013-01-07 DIAGNOSIS — A499 Bacterial infection, unspecified: Secondary | ICD-10-CM | POA: Diagnosis not present

## 2013-01-07 DIAGNOSIS — D0460 Carcinoma in situ of skin of unspecified upper limb, including shoulder: Secondary | ICD-10-CM | POA: Diagnosis not present

## 2013-01-07 DIAGNOSIS — C44621 Squamous cell carcinoma of skin of unspecified upper limb, including shoulder: Secondary | ICD-10-CM | POA: Diagnosis not present

## 2013-01-13 DIAGNOSIS — L97209 Non-pressure chronic ulcer of unspecified calf with unspecified severity: Secondary | ICD-10-CM | POA: Diagnosis not present

## 2013-01-13 DIAGNOSIS — E1149 Type 2 diabetes mellitus with other diabetic neurological complication: Secondary | ICD-10-CM | POA: Diagnosis not present

## 2013-01-13 DIAGNOSIS — I70269 Atherosclerosis of native arteries of extremities with gangrene, unspecified extremity: Secondary | ICD-10-CM | POA: Diagnosis not present

## 2013-01-21 ENCOUNTER — Telehealth: Payer: Self-pay | Admitting: Cardiology

## 2013-01-21 DIAGNOSIS — L080 Pyoderma: Secondary | ICD-10-CM | POA: Diagnosis not present

## 2013-01-21 DIAGNOSIS — C44621 Squamous cell carcinoma of skin of unspecified upper limb, including shoulder: Secondary | ICD-10-CM | POA: Diagnosis not present

## 2013-01-21 NOTE — Telephone Encounter (Signed)
I spoke with the patient's daughter. She thought the patient was seen in January with Dr. Jens Som. I advised no appointments since September of 2013 with him. 1 year f/u scheduled for 9/16 with Dr. Jens Som per recall.

## 2013-01-21 NOTE — Telephone Encounter (Signed)
New Prob      Daughter is calling about upcoming recall. States she needs some clarification about when pt is needing to come in to see Dr. Jens Som. Please call.

## 2013-01-23 DIAGNOSIS — Z7901 Long term (current) use of anticoagulants: Secondary | ICD-10-CM | POA: Diagnosis not present

## 2013-01-23 DIAGNOSIS — I4891 Unspecified atrial fibrillation: Secondary | ICD-10-CM | POA: Diagnosis not present

## 2013-02-06 DIAGNOSIS — Z7901 Long term (current) use of anticoagulants: Secondary | ICD-10-CM | POA: Diagnosis not present

## 2013-02-10 DIAGNOSIS — L97209 Non-pressure chronic ulcer of unspecified calf with unspecified severity: Secondary | ICD-10-CM | POA: Diagnosis not present

## 2013-02-10 DIAGNOSIS — E1149 Type 2 diabetes mellitus with other diabetic neurological complication: Secondary | ICD-10-CM | POA: Diagnosis not present

## 2013-02-12 ENCOUNTER — Other Ambulatory Visit: Payer: Self-pay | Admitting: Nurse Practitioner

## 2013-02-19 DIAGNOSIS — Z7901 Long term (current) use of anticoagulants: Secondary | ICD-10-CM | POA: Diagnosis not present

## 2013-02-19 DIAGNOSIS — I4891 Unspecified atrial fibrillation: Secondary | ICD-10-CM | POA: Diagnosis not present

## 2013-03-03 DIAGNOSIS — L080 Pyoderma: Secondary | ICD-10-CM | POA: Diagnosis not present

## 2013-03-03 DIAGNOSIS — L820 Inflamed seborrheic keratosis: Secondary | ICD-10-CM | POA: Diagnosis not present

## 2013-03-03 DIAGNOSIS — Z85828 Personal history of other malignant neoplasm of skin: Secondary | ICD-10-CM | POA: Diagnosis not present

## 2013-03-11 DIAGNOSIS — Z7901 Long term (current) use of anticoagulants: Secondary | ICD-10-CM | POA: Diagnosis not present

## 2013-03-11 DIAGNOSIS — E1149 Type 2 diabetes mellitus with other diabetic neurological complication: Secondary | ICD-10-CM | POA: Diagnosis not present

## 2013-03-11 DIAGNOSIS — I70269 Atherosclerosis of native arteries of extremities with gangrene, unspecified extremity: Secondary | ICD-10-CM | POA: Diagnosis not present

## 2013-03-11 DIAGNOSIS — L97509 Non-pressure chronic ulcer of other part of unspecified foot with unspecified severity: Secondary | ICD-10-CM | POA: Diagnosis not present

## 2013-03-11 DIAGNOSIS — I4891 Unspecified atrial fibrillation: Secondary | ICD-10-CM | POA: Diagnosis not present

## 2013-03-17 DIAGNOSIS — L97509 Non-pressure chronic ulcer of other part of unspecified foot with unspecified severity: Secondary | ICD-10-CM | POA: Diagnosis not present

## 2013-03-17 DIAGNOSIS — E1149 Type 2 diabetes mellitus with other diabetic neurological complication: Secondary | ICD-10-CM | POA: Diagnosis not present

## 2013-03-17 DIAGNOSIS — I70269 Atherosclerosis of native arteries of extremities with gangrene, unspecified extremity: Secondary | ICD-10-CM | POA: Diagnosis not present

## 2013-03-31 DIAGNOSIS — L97509 Non-pressure chronic ulcer of other part of unspecified foot with unspecified severity: Secondary | ICD-10-CM | POA: Diagnosis not present

## 2013-03-31 DIAGNOSIS — E1149 Type 2 diabetes mellitus with other diabetic neurological complication: Secondary | ICD-10-CM | POA: Diagnosis not present

## 2013-03-31 DIAGNOSIS — L080 Pyoderma: Secondary | ICD-10-CM | POA: Diagnosis not present

## 2013-03-31 DIAGNOSIS — I70269 Atherosclerosis of native arteries of extremities with gangrene, unspecified extremity: Secondary | ICD-10-CM | POA: Diagnosis not present

## 2013-03-31 DIAGNOSIS — M86179 Other acute osteomyelitis, unspecified ankle and foot: Secondary | ICD-10-CM | POA: Diagnosis not present

## 2013-04-01 DIAGNOSIS — I4891 Unspecified atrial fibrillation: Secondary | ICD-10-CM | POA: Diagnosis not present

## 2013-04-01 DIAGNOSIS — Z7901 Long term (current) use of anticoagulants: Secondary | ICD-10-CM | POA: Diagnosis not present

## 2013-04-15 ENCOUNTER — Other Ambulatory Visit: Payer: Self-pay | Admitting: Physical Medicine & Rehabilitation

## 2013-04-15 DIAGNOSIS — L97509 Non-pressure chronic ulcer of other part of unspecified foot with unspecified severity: Secondary | ICD-10-CM | POA: Diagnosis not present

## 2013-04-15 DIAGNOSIS — I70269 Atherosclerosis of native arteries of extremities with gangrene, unspecified extremity: Secondary | ICD-10-CM | POA: Diagnosis not present

## 2013-04-15 DIAGNOSIS — E1149 Type 2 diabetes mellitus with other diabetic neurological complication: Secondary | ICD-10-CM | POA: Diagnosis not present

## 2013-04-28 DIAGNOSIS — M069 Rheumatoid arthritis, unspecified: Secondary | ICD-10-CM | POA: Diagnosis not present

## 2013-04-29 ENCOUNTER — Ambulatory Visit (INDEPENDENT_AMBULATORY_CARE_PROVIDER_SITE_OTHER): Payer: Medicare Other | Admitting: Cardiology

## 2013-04-29 ENCOUNTER — Encounter: Payer: Self-pay | Admitting: Cardiology

## 2013-04-29 VITALS — BP 110/56 | HR 78 | Ht 65.0 in

## 2013-04-29 DIAGNOSIS — I4891 Unspecified atrial fibrillation: Secondary | ICD-10-CM | POA: Diagnosis not present

## 2013-04-29 DIAGNOSIS — I10 Essential (primary) hypertension: Secondary | ICD-10-CM

## 2013-04-29 DIAGNOSIS — R079 Chest pain, unspecified: Secondary | ICD-10-CM | POA: Diagnosis not present

## 2013-04-29 NOTE — Assessment & Plan Note (Signed)
Symptoms sound most likely to be GI in etiology. We discussed a functional study for risk stratification but she declined. She stated if her symptoms worsened she would consider in the future.

## 2013-04-29 NOTE — Assessment & Plan Note (Signed)
Rate is controlled. Continue Coumadin.

## 2013-04-29 NOTE — Assessment & Plan Note (Signed)
Blood pressure controlled. 

## 2013-04-29 NOTE — Patient Instructions (Addendum)
Your physician wants you to follow-up in: 6 MONTHS WITH DR CRENSHAW You will receive a reminder letter in the mail two months in advance. If you don't receive a letter, please call our office to schedule the follow-up appointment.  

## 2013-04-29 NOTE — Progress Notes (Signed)
HPI: Pleasant female for followup of atrial fibrillation. Patient has permanent atrial fibrillation. Echocardiogram in August of 2011 showed normal LV function, mild left ventricular hypertrophy, mild left atrial enlargement, mild aortic and tricuspid regurgitation. Holter monitor in February of 2010 showed A. fib with controlled ventricular response. Myoview in June of 2008 showed normal LV function with an ejection fraction of 63% and normal perfusion. Patient also has peripheral vascular disease. ABIs in January of 2013 showed greater than 50% bilateral SFA stenosis. Patient last seen in August 2013. Since then, she denies dyspnea, palpitations or syncope. She has had occasional chest discomfort after eating breakfast. It is relieved with tums. No exertional symptoms.   Current Outpatient Prescriptions  Medication Sig Dispense Refill  . acetaminophen (TYLENOL) 325 MG tablet Take 1-2 tablets (325-650 mg total) by mouth every 4 (four) hours as needed.      . calcium carbonate (OS-CAL) 600 MG TABS Take 600 mg by mouth daily.      . Calcium-Vitamin D-Vitamin K ([employer] CALCIUM SOFT CHEWS PO) Take 2 tablets by mouth daily.      . Cholecalciferol (VITAMIN D3) 400 UNITS CAPS Take 1 tablet by mouth daily.        . cyanocobalamin 500 MCG tablet Take 500 mcg by mouth daily.      Marland Kitchen doxycycline (VIBRAMYCIN) 100 MG capsule       . ferrous fumarate-iron polysaccharide complex (TANDEM) 162-115.2 MG CAPS Take 1 capsule by mouth daily with breakfast.  60 capsule  1  . fish oil-omega-3 fatty acids 1000 MG capsule Take 1 g by mouth daily.      . folic acid (FOLVITE) 1 MG tablet Take 1 mg by mouth daily.      . furosemide (LASIX) 20 MG tablet TAKE 1 TABLET EVERY OTHER DAY  30 tablet  2  . gabapentin (NEURONTIN) 400 MG capsule Take 1-2 capsules (400-800 mg total) by mouth 2 (two) times daily. Take one pill in am and two pills at bedtime  90 capsule  1  . glimepiride (AMARYL) 4 MG tablet Take 2 mg by mouth daily before  breakfast.       . levothyroxine (SYNTHROID, LEVOTHROID) 50 MCG tablet Take 50 mcg by mouth daily with breakfast.       . lisinopril-hydrochlorothiazide (PRINZIDE,ZESTORETIC) 20-25 MG per tablet Take 1 tablet by mouth daily with breakfast.       . metFORMIN (GLUCOPHAGE) 500 MG tablet Take 500 mg by mouth daily with breakfast.       . methocarbamol (ROBAXIN) 500 MG tablet Take 1 tablet (500 mg total) by mouth every 6 (six) hours as needed.  45 tablet  1  . METHOTREXATE SODIUM IJ Inject 0.4 mLs as directed once a week. On tuesday      . Multiple Vitamin (MULITIVITAMIN WITH MINERALS) TABS Take 1 tablet by mouth daily.      . Multiple Vitamins-Minerals (ICAPS AREDS FORMULA PO) Take 1 capsule by mouth daily with breakfast.      . nitroGLYCERIN (NITRODUR - DOSED IN MG/24 HR) 0.2 mg/hr patch       . protein supplement (RESOURCE BENEPROTEIN) POWD Take 6 g by mouth 3 (three) times daily with meals.      . tiotropium (SPIRIVA) 18 MCG inhalation capsule Place 18 mcg into inhaler and inhale daily with breakfast.       . traZODone (DESYREL) 25 mg TABS Take 0.5 tablets (25 mg total) by mouth at bedtime. For insomnia  15 tablet  1  . vitamin C (ASCORBIC ACID) 500 MG tablet Take 500 mg by mouth daily.      Marland Kitchen warfarin (COUMADIN) 4 MG tablet Take 1 tablet (4 mg total) by mouth daily. Take one pill daily in evening.  30 tablet  1   No current facility-administered medications for this visit.     Past Medical History  Diagnosis Date  . Lung disease     BRONCHOSPASTIC  . Neuropathy     takes Gabapentin bid  . Hypercholesterolemia     doesn't require meds for this  . Anemia   . COPD (chronic obstructive pulmonary disease)   . Blood transfusion 2012 ?APRIL  . Hypertension     takes Prinizide daily  . Chronic atrial fibrillation     takes Coumadin as instructed  . Peripheral vascular disease   . CHF (congestive heart failure)     takes Lasix every other day  . Shortness of breath     uses spiriva  daily;with exertion  . Emphysema   . Pneumonia     hx of   . History of head injury     many yrs ago  . Arthritis     rheumatoid and takes Methotrexate weekly  . Bruises easily   . Open wound     with a foul odor-left big toe  . History of bladder infections   . Vaginal discharge     has seen GYN but says its nothing to worry about  . History of blood transfusion   . Diabetes mellitus     takes Metformin and Amaryl daily  . Hypothyroidism     takes Synthroid daily  . Macular degeneration     dry    Past Surgical History  Procedure Laterality Date  . Total hip arthroplasty      LEFT HIP  . Back surgery    . Fracture surgery  2011    LEFT HIP FX /REPLACEMENT   . Joint replacement      HIP LEFT   . Cholecystectomy  1951  . Dilation and curettage of uterus    . Abdominal hysterectomy  1975  . Amputation  11/09/2011    Procedure: AMPUTATION DIGIT;  Surgeon: Kathryne Hitch, MD;  Location: [facility] OR;  Service: Orthopedics;  Laterality: Right;  Right Great Toe Amputation  . Eye surgery      bilateral -cataract surgery  . Amputation  01/05/2012    Procedure: AMPUTATION RAY;  Surgeon: Kathryne Hitch, MD;  Location: [facility] ORS;  Service: Orthopedics;  Laterality: Left;  Amputation Left 4th and 5th Toes  . Cardioversion    . Amputation  05/31/2012    Procedure: AMPUTATION RAY;  Surgeon: Nadara Mustard, MD;  Location: [facility] OR;  Service: Orthopedics;  Laterality: Left;  Left foot 1st ray amp  . Amputation  06/21/2012    Procedure: AMPUTATION BELOW KNEE;  Surgeon: Nadara Mustard, MD;  Location: [facility] OR;  Service: Orthopedics;  Laterality: Left;  Left Below Knee Amputation    History   Social History  . Marital Status: Widowed    Spouse Name: N/A    Number of Children: N/A  . Years of Education: N/A   Occupational History  . Not on file.   Social History Main Topics  . Smoking status: Former Smoker    Types: Cigarettes  . Smokeless tobacco: Former Neurosurgeon    Quit date:  01/01/1981  . Alcohol Use: No  . Drug Use: No  .  Sexual Activity: No   Other Topics Concern  . Not on file   Social History Narrative  . No narrative on file    ROS: no fevers or chills, productive cough, hemoptysis, dysphasia, odynophagia, melena, hematochezia, dysuria, hematuria, rash, seizure activity, orthopnea, PND, pedal edema, claudication. Remaining systems are negative.  Physical Exam: Well-developed well-nourished in no acute distress.  Skin is warm and dry.  HEENT is normal.  Neck is supple.  Chest is clear to auscultation with normal expansion.  Cardiovascular exam is irregular  Abdominal exam nontender or distended. No masses palpated. Extremities show no edema. neuro grossly intact  ECG Atrial fibrillation at a rate of 78. No ST changes. Cannot rule out prior septal infarct.

## 2013-05-05 DIAGNOSIS — E1149 Type 2 diabetes mellitus with other diabetic neurological complication: Secondary | ICD-10-CM | POA: Diagnosis not present

## 2013-05-05 DIAGNOSIS — I70269 Atherosclerosis of native arteries of extremities with gangrene, unspecified extremity: Secondary | ICD-10-CM | POA: Diagnosis not present

## 2013-05-06 DIAGNOSIS — Z23 Encounter for immunization: Secondary | ICD-10-CM | POA: Diagnosis not present

## 2013-05-06 DIAGNOSIS — M48062 Spinal stenosis, lumbar region with neurogenic claudication: Secondary | ICD-10-CM | POA: Diagnosis not present

## 2013-05-06 DIAGNOSIS — S88119A Complete traumatic amputation at level between knee and ankle, unspecified lower leg, initial encounter: Secondary | ICD-10-CM | POA: Diagnosis not present

## 2013-05-06 DIAGNOSIS — E1159 Type 2 diabetes mellitus with other circulatory complications: Secondary | ICD-10-CM | POA: Diagnosis not present

## 2013-05-06 DIAGNOSIS — J449 Chronic obstructive pulmonary disease, unspecified: Secondary | ICD-10-CM | POA: Diagnosis not present

## 2013-05-06 DIAGNOSIS — I4891 Unspecified atrial fibrillation: Secondary | ICD-10-CM | POA: Diagnosis not present

## 2013-05-06 DIAGNOSIS — Z7901 Long term (current) use of anticoagulants: Secondary | ICD-10-CM | POA: Diagnosis not present

## 2013-05-06 DIAGNOSIS — D649 Anemia, unspecified: Secondary | ICD-10-CM | POA: Diagnosis not present

## 2013-05-06 DIAGNOSIS — I509 Heart failure, unspecified: Secondary | ICD-10-CM | POA: Diagnosis not present

## 2013-06-03 DIAGNOSIS — L97509 Non-pressure chronic ulcer of other part of unspecified foot with unspecified severity: Secondary | ICD-10-CM | POA: Diagnosis not present

## 2013-06-03 DIAGNOSIS — M86179 Other acute osteomyelitis, unspecified ankle and foot: Secondary | ICD-10-CM | POA: Diagnosis not present

## 2013-06-03 DIAGNOSIS — I70269 Atherosclerosis of native arteries of extremities with gangrene, unspecified extremity: Secondary | ICD-10-CM | POA: Diagnosis not present

## 2013-06-03 DIAGNOSIS — E1149 Type 2 diabetes mellitus with other diabetic neurological complication: Secondary | ICD-10-CM | POA: Diagnosis not present

## 2013-06-04 DIAGNOSIS — I4891 Unspecified atrial fibrillation: Secondary | ICD-10-CM | POA: Diagnosis not present

## 2013-06-04 DIAGNOSIS — Z7901 Long term (current) use of anticoagulants: Secondary | ICD-10-CM | POA: Diagnosis not present

## 2013-06-05 ENCOUNTER — Encounter (HOSPITAL_COMMUNITY): Payer: Self-pay | Admitting: Pharmacy Technician

## 2013-06-05 ENCOUNTER — Encounter (HOSPITAL_COMMUNITY): Payer: Self-pay | Admitting: *Deleted

## 2013-06-05 ENCOUNTER — Other Ambulatory Visit (HOSPITAL_COMMUNITY): Payer: Self-pay | Admitting: Orthopedic Surgery

## 2013-06-05 ENCOUNTER — Other Ambulatory Visit (HOSPITAL_COMMUNITY): Payer: Self-pay | Admitting: *Deleted

## 2013-06-05 MED ORDER — CEFAZOLIN SODIUM-DEXTROSE 2-3 GM-% IV SOLR
2.0000 g | INTRAVENOUS | Status: AC
  Administered 2013-06-06: 2 g via INTRAVENOUS
  Filled 2013-06-05: qty 50

## 2013-06-05 NOTE — Progress Notes (Signed)
Called pt for pre-op call and she requested that I speak with her daughter, Sydney [name] to verify her medical/surgical hx. Sydney [name] given instructions for tomorrow - time of arrival, meds that pt can take in the am, NPO after midnight.

## 2013-06-06 ENCOUNTER — Encounter (HOSPITAL_COMMUNITY)
Admission: RE | Disposition: A | Discharge status: Skilled Nursing Facility | Payer: Self-pay | Source: Ambulatory Visit | Attending: Orthopedic Surgery

## 2013-06-06 ENCOUNTER — Encounter (HOSPITAL_COMMUNITY): Payer: Medicare Other | Admitting: Certified Registered"

## 2013-06-06 ENCOUNTER — Encounter (HOSPITAL_COMMUNITY): Payer: Self-pay | Admitting: Certified Registered Nurse Anesthetist

## 2013-06-06 ENCOUNTER — Inpatient Hospital Stay (HOSPITAL_COMMUNITY)
Admission: RE | Admit: 2013-06-06 | Discharge: 2013-06-09 | DRG: 240 | Disposition: A | Discharge status: Skilled Nursing Facility | Payer: Medicare Other | Source: Ambulatory Visit | Attending: Orthopedic Surgery | Admitting: Orthopedic Surgery

## 2013-06-06 ENCOUNTER — Ambulatory Visit (HOSPITAL_COMMUNITY): Payer: Medicare Other

## 2013-06-06 ENCOUNTER — Ambulatory Visit (HOSPITAL_COMMUNITY): Payer: Medicare Other | Admitting: Certified Registered"

## 2013-06-06 DIAGNOSIS — I872 Venous insufficiency (chronic) (peripheral): Secondary | ICD-10-CM | POA: Diagnosis present

## 2013-06-06 DIAGNOSIS — S88119A Complete traumatic amputation at level between knee and ankle, unspecified lower leg, initial encounter: Secondary | ICD-10-CM | POA: Diagnosis not present

## 2013-06-06 DIAGNOSIS — I798 Other disorders of arteries, arterioles and capillaries in diseases classified elsewhere: Secondary | ICD-10-CM | POA: Diagnosis present

## 2013-06-06 DIAGNOSIS — Z7901 Long term (current) use of anticoagulants: Secondary | ICD-10-CM

## 2013-06-06 DIAGNOSIS — I509 Heart failure, unspecified: Secondary | ICD-10-CM | POA: Diagnosis present

## 2013-06-06 DIAGNOSIS — N179 Acute kidney failure, unspecified: Secondary | ICD-10-CM | POA: Diagnosis present

## 2013-06-06 DIAGNOSIS — K5900 Constipation, unspecified: Secondary | ICD-10-CM | POA: Diagnosis not present

## 2013-06-06 DIAGNOSIS — M069 Rheumatoid arthritis, unspecified: Secondary | ICD-10-CM | POA: Diagnosis present

## 2013-06-06 DIAGNOSIS — E119 Type 2 diabetes mellitus without complications: Secondary | ICD-10-CM | POA: Diagnosis present

## 2013-06-06 DIAGNOSIS — H3530 Unspecified macular degeneration: Secondary | ICD-10-CM | POA: Diagnosis present

## 2013-06-06 DIAGNOSIS — Z01818 Encounter for other preprocedural examination: Secondary | ICD-10-CM | POA: Diagnosis not present

## 2013-06-06 DIAGNOSIS — I10 Essential (primary) hypertension: Secondary | ICD-10-CM | POA: Diagnosis present

## 2013-06-06 DIAGNOSIS — I739 Peripheral vascular disease, unspecified: Secondary | ICD-10-CM | POA: Diagnosis not present

## 2013-06-06 DIAGNOSIS — D62 Acute posthemorrhagic anemia: Secondary | ICD-10-CM | POA: Diagnosis not present

## 2013-06-06 DIAGNOSIS — E1159 Type 2 diabetes mellitus with other circulatory complications: Secondary | ICD-10-CM | POA: Diagnosis not present

## 2013-06-06 DIAGNOSIS — S98919A Complete traumatic amputation of unspecified foot, level unspecified, initial encounter: Secondary | ICD-10-CM | POA: Diagnosis not present

## 2013-06-06 DIAGNOSIS — M86179 Other acute osteomyelitis, unspecified ankle and foot: Secondary | ICD-10-CM | POA: Diagnosis not present

## 2013-06-06 DIAGNOSIS — L02619 Cutaneous abscess of unspecified foot: Secondary | ICD-10-CM | POA: Diagnosis present

## 2013-06-06 DIAGNOSIS — E1149 Type 2 diabetes mellitus with other diabetic neurological complication: Secondary | ICD-10-CM | POA: Diagnosis not present

## 2013-06-06 DIAGNOSIS — I96 Gangrene, not elsewhere classified: Secondary | ICD-10-CM | POA: Diagnosis present

## 2013-06-06 DIAGNOSIS — I70269 Atherosclerosis of native arteries of extremities with gangrene, unspecified extremity: Secondary | ICD-10-CM | POA: Diagnosis not present

## 2013-06-06 DIAGNOSIS — L97509 Non-pressure chronic ulcer of other part of unspecified foot with unspecified severity: Secondary | ICD-10-CM | POA: Diagnosis not present

## 2013-06-06 DIAGNOSIS — E039 Hypothyroidism, unspecified: Secondary | ICD-10-CM | POA: Diagnosis present

## 2013-06-06 DIAGNOSIS — Z89431 Acquired absence of right foot: Secondary | ICD-10-CM

## 2013-06-06 DIAGNOSIS — I4891 Unspecified atrial fibrillation: Secondary | ICD-10-CM | POA: Diagnosis present

## 2013-06-06 DIAGNOSIS — E7800 Pure hypercholesterolemia, unspecified: Secondary | ICD-10-CM | POA: Diagnosis present

## 2013-06-06 DIAGNOSIS — J449 Chronic obstructive pulmonary disease, unspecified: Secondary | ICD-10-CM | POA: Diagnosis not present

## 2013-06-06 DIAGNOSIS — N289 Disorder of kidney and ureter, unspecified: Secondary | ICD-10-CM | POA: Clinically undetermined

## 2013-06-06 HISTORY — PX: FOOT AMPUTATION THROUGH METATARSAL: SHX644

## 2013-06-06 HISTORY — PX: AMPUTATION: SHX166

## 2013-06-06 HISTORY — DX: Rheumatoid arthritis, unspecified: M06.9

## 2013-06-06 HISTORY — DX: Bronchopneumonia, unspecified organism: J18.0

## 2013-06-06 LAB — GLUCOSE, CAPILLARY
Glucose-Capillary: 100 mg/dL — ABNORMAL HIGH (ref 70–99)
Glucose-Capillary: 89 mg/dL (ref 70–99)
Glucose-Capillary: 87 mg/dL (ref 70–99)
Glucose-Capillary: 113 mg/dL — ABNORMAL HIGH (ref 70–99)

## 2013-06-06 LAB — COMPREHENSIVE METABOLIC PANEL
AST: 37 U/L (ref 0–37)
Albumin: 3.7 g/dL (ref 3.5–5.2)
Alkaline Phosphatase: 69 U/L (ref 39–117)
CO2: 26 mEq/L (ref 19–32)
Calcium: 9.6 mg/dL (ref 8.4–10.5)
Chloride: 97 mEq/L (ref 96–112)
Creatinine, Ser: 1.52 mg/dL — ABNORMAL HIGH (ref 0.50–1.10)
GFR calc Af Amer: 34 mL/min — ABNORMAL LOW (ref >90)
GFR calc non Af Amer: 29 mL/min — ABNORMAL LOW (ref >90)
Glucose, Bld: 107 mg/dL — ABNORMAL HIGH (ref 70–99)
Sodium: 137 mEq/L (ref 135–145)
Total Protein: 7.4 g/dL (ref 6.0–8.3)

## 2013-06-06 LAB — PROTIME-INR: INR: 2.22 — ABNORMAL HIGH (ref 0.00–1.49)

## 2013-06-06 LAB — CBC
MCH: 31.1 pg (ref 26.0–34.0)
MCHC: 33 g/dL (ref 30.0–36.0)
MCV: 94.3 fL (ref 78.0–100.0)
Platelets: 226 10*3/uL (ref 150–400)
RBC: 3.31 MIL/uL — ABNORMAL LOW (ref 3.87–5.11)
WBC: 8.9 10*3/uL (ref 4.0–10.5)

## 2013-06-06 SURGERY — AMPUTATION, FOOT, PARTIAL
Anesthesia: General | Site: Foot | Laterality: Right | Wound class: Clean

## 2013-06-06 MED ORDER — METHOCARBAMOL 500 MG PO TABS
500.0000 mg | ORAL_TABLET | Freq: Four times a day (QID) | ORAL | Status: DC | PRN
  Administered 2013-06-06 – 2013-06-08 (×7): 500 mg via ORAL
  Filled 2013-06-06 (×9): qty 1

## 2013-06-06 MED ORDER — BENEPROTEIN PO POWD
1.0000 | Freq: Two times a day (BID) | ORAL | Status: DC
  Administered 2013-06-06 – 2013-06-09 (×6): 6 g via ORAL
  Filled 2013-06-06: qty 227

## 2013-06-06 MED ORDER — 0.9 % SODIUM CHLORIDE (POUR BTL) OPTIME
TOPICAL | Status: DC | PRN
  Administered 2013-06-06: 1000 mL

## 2013-06-06 MED ORDER — GABAPENTIN 300 MG PO CAPS
300.0000 mg | ORAL_CAPSULE | Freq: Two times a day (BID) | ORAL | Status: DC
  Administered 2013-06-06 – 2013-06-09 (×6): 300 mg via ORAL
  Filled 2013-06-06 (×7): qty 1

## 2013-06-06 MED ORDER — ONDANSETRON HCL 4 MG/2ML IJ SOLN: 4.0000 mg | Freq: Four times a day (QID) | INTRAMUSCULAR | Status: DC | PRN

## 2013-06-06 MED ORDER — INSULIN ASPART 100 UNIT/ML ~~LOC~~ SOLN
0.0000 [IU] | Freq: Three times a day (TID) | SUBCUTANEOUS | Status: DC
  Administered 2013-06-09: 1 [IU] via SUBCUTANEOUS

## 2013-06-06 MED ORDER — WARFARIN SODIUM 2 MG PO TABS
2.0000 mg | ORAL_TABLET | ORAL | Status: DC
  Administered 2013-06-06: 2 mg via ORAL
  Filled 2013-06-06: qty 1

## 2013-06-06 MED ORDER — METOCLOPRAMIDE HCL 10 MG PO TABS: 5.0000 mg | ORAL_TABLET | Freq: Three times a day (TID) | ORAL | Status: DC | PRN

## 2013-06-06 MED ORDER — FUROSEMIDE 20 MG PO TABS
20.0000 mg | ORAL_TABLET | Freq: Every day | ORAL | Status: DC
  Administered 2013-06-06 – 2013-06-07 (×2): 20 mg via ORAL
  Filled 2013-06-06 (×2): qty 1

## 2013-06-06 MED ORDER — HYDROMORPHONE HCL PF 1 MG/ML IJ SOLN
0.5000 mg | INTRAMUSCULAR | Status: DC | PRN
  Administered 2013-06-06 (×2): 1 mg via INTRAVENOUS
  Administered 2013-06-06 – 2013-06-07 (×3): 0.5 mg via INTRAVENOUS
  Administered 2013-06-07: 1 mg via INTRAVENOUS
  Administered 2013-06-07 (×2): 0.5 mg via INTRAVENOUS
  Administered 2013-06-08: 1 mg via INTRAVENOUS
  Administered 2013-06-08 (×2): 0.5 mg via INTRAVENOUS
  Administered 2013-06-08: 1 mg via INTRAVENOUS
  Administered 2013-06-09 (×3): 0.5 mg via INTRAVENOUS
  Filled 2013-06-06 (×15): qty 1

## 2013-06-06 MED ORDER — LEVOTHYROXINE SODIUM 50 MCG PO TABS
50.0000 ug | ORAL_TABLET | Freq: Every day | ORAL | Status: DC
  Administered 2013-06-07 – 2013-06-09 (×3): 50 ug via ORAL
  Filled 2013-06-06 (×4): qty 1

## 2013-06-06 MED ORDER — LACTATED RINGERS IV SOLN
INTRAVENOUS | Status: DC | PRN
  Administered 2013-06-06: 13:00:00 via INTRAVENOUS

## 2013-06-06 MED ORDER — LIDOCAINE HCL (CARDIAC) 20 MG/ML IV SOLN
INTRAVENOUS | Status: DC | PRN
  Administered 2013-06-06: 40 mg via INTRAVENOUS

## 2013-06-06 MED ORDER — HYDROCHLOROTHIAZIDE 25 MG PO TABS
25.0000 mg | ORAL_TABLET | Freq: Every day | ORAL | Status: DC
  Administered 2013-06-07: 25 mg via ORAL
  Filled 2013-06-06 (×2): qty 1

## 2013-06-06 MED ORDER — DROPERIDOL 2.5 MG/ML IJ SOLN: 0.6250 mg | INTRAMUSCULAR | Status: DC | PRN

## 2013-06-06 MED ORDER — LISINOPRIL-HYDROCHLOROTHIAZIDE 20-25 MG PO TABS: 1.0000 | ORAL_TABLET | Freq: Every day | ORAL | Status: DC

## 2013-06-06 MED ORDER — WARFARIN SODIUM 2 MG PO TABS: 2.0000 mg | ORAL_TABLET | ORAL | Status: DC

## 2013-06-06 MED ORDER — FENTANYL CITRATE 0.05 MG/ML IJ SOLN
25.0000 ug | INTRAMUSCULAR | Status: DC | PRN
  Administered 2013-06-06: 25 ug via INTRAVENOUS
  Administered 2013-06-06: 50 ug via INTRAVENOUS
  Administered 2013-06-06: 25 ug via INTRAVENOUS

## 2013-06-06 MED ORDER — CEFAZOLIN SODIUM 1-5 GM-% IV SOLN
1.0000 g | Freq: Four times a day (QID) | INTRAVENOUS | Status: AC
  Administered 2013-06-06 – 2013-06-07 (×3): 1 g via INTRAVENOUS
  Filled 2013-06-06 (×3): qty 50

## 2013-06-06 MED ORDER — ONDANSETRON HCL 4 MG PO TABS: 4.0000 mg | ORAL_TABLET | Freq: Four times a day (QID) | ORAL | Status: DC | PRN

## 2013-06-06 MED ORDER — EPHEDRINE SULFATE 50 MG/ML IJ SOLN
INTRAMUSCULAR | Status: DC | PRN
  Administered 2013-06-06 (×2): 5 mg via INTRAVENOUS

## 2013-06-06 MED ORDER — PROPOFOL 10 MG/ML IV BOLUS
INTRAVENOUS | Status: DC | PRN
  Administered 2013-06-06: 100 mg via INTRAVENOUS

## 2013-06-06 MED ORDER — WARFARIN SODIUM 4 MG PO TABS
4.0000 mg | ORAL_TABLET | ORAL | Status: DC
  Administered 2013-06-07: 4 mg via ORAL
  Filled 2013-06-06 (×2): qty 1

## 2013-06-06 MED ORDER — TIOTROPIUM BROMIDE MONOHYDRATE 18 MCG IN CAPS
18.0000 ug | ORAL_CAPSULE | Freq: Every day | RESPIRATORY_TRACT | Status: DC
  Administered 2013-06-07 – 2013-06-09 (×3): 18 ug via RESPIRATORY_TRACT
  Filled 2013-06-06: qty 5

## 2013-06-06 MED ORDER — ONDANSETRON HCL 4 MG/2ML IJ SOLN
INTRAMUSCULAR | Status: DC | PRN
  Administered 2013-06-06: 4 mg via INTRAVENOUS

## 2013-06-06 MED ORDER — METOCLOPRAMIDE HCL 5 MG/ML IJ SOLN: 5.0000 mg | Freq: Three times a day (TID) | INTRAMUSCULAR | Status: DC | PRN

## 2013-06-06 MED ORDER — METFORMIN HCL 500 MG PO TABS: 500.0000 mg | ORAL_TABLET | Freq: Every day | ORAL | Status: DC

## 2013-06-06 MED ORDER — METHOCARBAMOL 500 MG PO TABS: 500.0000 mg | ORAL_TABLET | Freq: Four times a day (QID) | ORAL | Status: DC | PRN

## 2013-06-06 MED ORDER — FENTANYL CITRATE 0.05 MG/ML IJ SOLN
INTRAMUSCULAR | Status: AC
  Filled 2013-06-06: qty 2

## 2013-06-06 MED ORDER — SODIUM CHLORIDE 0.9 % IV SOLN
INTRAVENOUS | Status: DC
  Administered 2013-06-06: 1 mL via INTRAVENOUS

## 2013-06-06 MED ORDER — GLIMEPIRIDE 2 MG PO TABS
2.0000 mg | ORAL_TABLET | Freq: Every day | ORAL | Status: DC
  Administered 2013-06-07 – 2013-06-09 (×3): 2 mg via ORAL
  Filled 2013-06-06 (×4): qty 1

## 2013-06-06 MED ORDER — METHOCARBAMOL 100 MG/ML IJ SOLN: 500.0000 mg | Freq: Four times a day (QID) | INTRAVENOUS | Status: DC | PRN

## 2013-06-06 MED ORDER — FENTANYL CITRATE 0.05 MG/ML IJ SOLN
INTRAMUSCULAR | Status: DC | PRN
  Administered 2013-06-06: 100 ug via INTRAVENOUS

## 2013-06-06 MED ORDER — LISINOPRIL 20 MG PO TABS
20.0000 mg | ORAL_TABLET | Freq: Every day | ORAL | Status: DC
  Administered 2013-06-07: 20 mg via ORAL
  Filled 2013-06-06 (×2): qty 1

## 2013-06-06 MED ORDER — WARFARIN - PHARMACIST DOSING INPATIENT
Freq: Every day | Status: DC
  Administered 2013-06-06: 1

## 2013-06-06 SURGICAL SUPPLY — 44 items
BANDAGE GAUZE ELAST BULKY 4 IN (GAUZE/BANDAGES/DRESSINGS) ×3 IMPLANT
BLADE SAW SGTL HD 18.5X60.5X1. (BLADE) ×2 IMPLANT
BLADE SURG 10 STRL SS (BLADE) IMPLANT
BNDG COHESIVE 4X5 TAN STRL (GAUZE/BANDAGES/DRESSINGS) ×3 IMPLANT
CLOTH BEACON ORANGE TIMEOUT ST (SAFETY) ×2 IMPLANT
COVER SURGICAL LIGHT HANDLE (MISCELLANEOUS) ×2 IMPLANT
CUFF TOURNIQUET SINGLE 34IN LL (TOURNIQUET CUFF) IMPLANT
CUFF TOURNIQUET SINGLE 44IN (TOURNIQUET CUFF) IMPLANT
DRAPE U-SHAPE 47X51 STRL (DRAPES) ×2 IMPLANT
DRSG ADAPTIC 3X8 NADH LF (GAUZE/BANDAGES/DRESSINGS) ×2 IMPLANT
DRSG PAD ABDOMINAL 8X10 ST (GAUZE/BANDAGES/DRESSINGS) ×2 IMPLANT
DURAPREP 26ML APPLICATOR (WOUND CARE) ×2 IMPLANT
ELECT REM PT RETURN 9FT ADLT (ELECTROSURGICAL) ×2
ELECTRODE REM PT RTRN 9FT ADLT (ELECTROSURGICAL) ×1 IMPLANT
GLOVE BIOGEL PI IND STRL 6.5 (GLOVE) ×1 IMPLANT
GLOVE BIOGEL PI IND STRL 7.0 (GLOVE) IMPLANT
GLOVE BIOGEL PI IND STRL 9 (GLOVE) ×1 IMPLANT
GLOVE BIOGEL PI INDICATOR 6.5 (GLOVE) ×1
GLOVE BIOGEL PI INDICATOR 7.0 (GLOVE) ×1
GLOVE BIOGEL PI INDICATOR 9 (GLOVE) ×1
GLOVE SURG ORTHO 9.0 STRL STRW (GLOVE) ×2 IMPLANT
GLOVE SURG SS PI 6.5 STRL IVOR (GLOVE) ×1 IMPLANT
GLOVE SURG SS PI 7.0 STRL IVOR (GLOVE) ×2 IMPLANT
GOWN PREVENTION PLUS XLARGE (GOWN DISPOSABLE) ×3 IMPLANT
GOWN SRG XL XLNG 56XLVL 4 (GOWN DISPOSABLE) ×2 IMPLANT
GOWN STRL NON-REIN XL XLG LVL4 (GOWN DISPOSABLE) ×2
KIT BASIN OR (CUSTOM PROCEDURE TRAY) ×2 IMPLANT
KIT ROOM TURNOVER OR (KITS) ×2 IMPLANT
MANIFOLD NEPTUNE II (INSTRUMENTS) IMPLANT
NS IRRIG 1000ML POUR BTL (IV SOLUTION) ×2 IMPLANT
PACK ORTHO EXTREMITY (CUSTOM PROCEDURE TRAY) ×2 IMPLANT
PAD ARMBOARD 7.5X6 YLW CONV (MISCELLANEOUS) ×4 IMPLANT
PAD CAST 4YDX4 CTTN HI CHSV (CAST SUPPLIES) ×1 IMPLANT
PADDING CAST COTTON 4X4 STRL (CAST SUPPLIES) ×2
SPONGE GAUZE 4X4 12PLY (GAUZE/BANDAGES/DRESSINGS) ×2 IMPLANT
SPONGE LAP 18X18 X RAY DECT (DISPOSABLE) ×2 IMPLANT
STAPLER VISISTAT 35W (STAPLE) IMPLANT
SUT ETHILON 2 0 PSLX (SUTURE) ×4 IMPLANT
SUT VIC AB 2-0 CTB1 (SUTURE) IMPLANT
TOWEL OR 17X24 6PK STRL BLUE (TOWEL DISPOSABLE) ×2 IMPLANT
TOWEL OR 17X26 10 PK STRL BLUE (TOWEL DISPOSABLE) ×2 IMPLANT
TUBE CONNECTING 12X1/4 (SUCTIONS) ×2 IMPLANT
WATER STERILE IRR 1000ML POUR (IV SOLUTION) ×2 IMPLANT
YANKAUER SUCT BULB TIP NO VENT (SUCTIONS) ×2 IMPLANT

## 2013-06-06 NOTE — H&P (Signed)
Sydney [name] is an 79 y.o. female.   Chief Complaint: Abscess ulceration right forefoot HPI: Patient is a 79 year old woman with diabetes peripheral vascular disease status post left great toe amputation who presents at this time with abscess ulceration right forefoot that has failed conservative therapy.  Past Medical History  Diagnosis Date  . Lung disease     BRONCHOSPASTIC  . Neuropathy     takes Gabapentin bid  . Hypercholesterolemia     doesn't require meds for this  . Anemia   . COPD (chronic obstructive pulmonary disease)   . Blood transfusion 2012 ?APRIL  . Hypertension     takes Prinizide daily  . Chronic atrial fibrillation     takes Coumadin as instructed  . Peripheral vascular disease   . CHF (congestive heart failure)     takes Lasix every other day  . Shortness of breath     uses spiriva daily;with exertion  . Emphysema   . Pneumonia     hx of   . History of head injury     many yrs ago  . Arthritis     rheumatoid and takes Methotrexate weekly  . Bruises easily   . Open wound     with a foul odor-left big toe  . History of bladder infections   . Vaginal discharge     has seen GYN but says its nothing to worry about  . History of blood transfusion   . Diabetes mellitus     takes Metformin and Amaryl daily  . Hypothyroidism     takes Synthroid daily  . Macular degeneration     dry    Past Surgical History  Procedure Laterality Date  . Total hip arthroplasty      LEFT HIP  . Back surgery    . Fracture surgery  2011    LEFT HIP FX /REPLACEMENT   . Joint replacement      HIP LEFT   . Cholecystectomy  1951  . Dilation and curettage of uterus    . Abdominal hysterectomy  1975  . Amputation  11/09/2011    Procedure: AMPUTATION DIGIT;  Surgeon: Kathryne Hitch, MD;  Location: [facility] OR;  Service: Orthopedics;  Laterality: Right;  Right Great Toe Amputation  . Eye surgery      bilateral -cataract surgery  . Amputation  01/05/2012    Procedure:  AMPUTATION RAY;  Surgeon: Kathryne Hitch, MD;  Location: [facility] ORS;  Service: Orthopedics;  Laterality: Left;  Amputation Left 4th and 5th Toes  . Cardioversion    . Amputation  05/31/2012    Procedure: AMPUTATION RAY;  Surgeon: Nadara Mustard, MD;  Location: [facility] OR;  Service: Orthopedics;  Laterality: Left;  Left foot 1st ray amp  . Amputation  06/21/2012    Procedure: AMPUTATION BELOW KNEE;  Surgeon: Nadara Mustard, MD;  Location: [facility] OR;  Service: Orthopedics;  Laterality: Left;  Left Below Knee Amputation    Family History  Problem Relation Age of Onset  . Heart disease Mother   . Stroke Mother   . Heart disease Father   . Stroke Father   . Heart attack Father   . Muscular dystrophy Brother   . Anesthesia problems Neg Hx   . Hypotension Neg Hx   . Malignant hyperthermia Neg Hx   . Pseudochol deficiency Neg Hx    Social History:  reports that she has quit smoking. Her smoking use included Cigarettes. She smoked 0.00 packs per  day. She has never used smokeless tobacco. She reports that she does not drink alcohol or use illicit drugs.  Allergies:  Allergies  Allergen Reactions  . Other     Narcotics=makes her very disoriented  . Percocet [Oxycodone-Acetaminophen] Other (See Comments)    Dilerium, hallucinations  . Tramadol Anxiety  . Vicodin [Hydrocodone-Acetaminophen] Anxiety    No prescriptions prior to admission    No results found for this or any previous visit (from the past 48 hour(s)). No results found.  Review of Systems  All other systems reviewed and are negative.    There were no vitals taken for this visit. Physical Exam  Patient has abscess ulceration right forefoot. Assessment/Plan Assessment: Abscess ulceration right forefoot.  Plan: Will plan for right midfoot amputation. Risks and benefits were discussed including infection nonhealing of the wound need for higher level amputation. Patient states she understands and wished to proceed at this  time.  DUDA,MARCUS V 06/06/2013, 6:22 AM

## 2013-06-06 NOTE — Op Note (Signed)
OPERATIVE REPORT  DATE OF SURGERY: 06/06/2013  PATIENT:  Sydney [name],  79 y.o. female  PRE-OPERATIVE DIAGNOSIS:  Gangrene Right Forefoot  POST-OPERATIVE DIAGNOSIS:  Gangrene Right Forefoot  PROCEDURE:  Procedure(s): RIGHT MIDFOOT AMPUTATION  SURGEON:  Surgeon(s): Nadara Mustard, MD  ANESTHESIA:   general  EBL:  Minimal ML  SPECIMEN:  Source of Specimen:  Right forefoot  TOURNIQUET:  * No tourniquets in log *  PROCEDURE DETAILS: Patient is a 79 year old woman with severe peripheral vascular disease she is status post foot salvage intervention who has developed progressive gangrenous changes of the right forefoot. She presents at this time for midfoot amputation. She is status post a transtibial amputation on the left. Risks and benefits were discussed including nonhealing of the wound need for potential transtibial amputation. Patient states she understands and wished to proceed at this time. Description of procedure patient was brought to the operating room and underwent a general anesthetic. After adequate levels of anesthesia were obtained patient's right lower extremity was prepped using DuraPrep draped into a sterile field. A fishmouth incision was made through the midfoot. She underwent a transmetatarsal amputation. There is a very small amount petechial bleeding. The wound was irrigated the skin was closed using 2-0 nylon without any tension on the skin. The wounds were covered with Adaptic orthopedic sponges AB dressing Kerlix and Coban. Patient was extubated taken to the PACU in stable condition.  PLAN OF CARE: Admit to inpatient   PATIENT DISPOSITION:  PACU - hemodynamically stable.   Nadara Mustard, MD 06/06/2013 2:01 PM

## 2013-06-06 NOTE — Consult Note (Signed)
Physical Medicine and Rehabilitation Consult  Reason for Consult: Right mid foot amputation and h/o L-BKA. Referring Physician: Dr. Lajoyce Corners   HPI: Sydney [name] is a 79 y.o. female with history of diabetes with peripheral neuropathy, HTN, A fib with chronic coumadin, severe PVD s/p L-BKA (CIR stay Nov 2013) and now with gangrenous changes right foot. Patient admitted on 06/06/13 for right mid foot amputation by Dr. Lajoyce Corners. Post op NWB-RLE. Pain control improving and able to tolerate activity. PT evaluation done yesterday--patient now ready for left prosthesis training as L-BKA wound has finally healed. MD, PT recommending CIR.     Review of Systems  Constitutional: Negative for fever.  Eyes: Negative for blurred vision.  Respiratory: Negative for cough.   Cardiovascular: Negative for palpitations.  Musculoskeletal: Positive for joint pain.  Skin: Negative for rash.  Neurological: Positive for sensory change.   Past Medical History  Diagnosis Date  . Lung disease     BRONCHOSPASTIC  . Neuropathy     takes Gabapentin bid  . Hypercholesterolemia     doesn't require meds for this  . Anemia   . COPD (chronic obstructive pulmonary disease)   . Blood transfusion ~ 2012    "once; not related to a surgery or bleeding that I remember" (06/06/2013)  . Hypertension     takes Prinizide daily  . Chronic atrial fibrillation     takes Coumadin as instructed  . Peripheral vascular disease   . CHF (congestive heart failure)     takes Lasix every other day  . Emphysema   . History of head injury     many yrs ago  . Bruises easily   . History of bladder infections   . Vaginal discharge     has seen GYN but says its nothing to worry about  . Diabetes mellitus     takes Metformin and Amaryl daily  . Hypothyroidism     takes Synthroid daily  . Macular degeneration     dry  . Bronchial pneumonia     "once; several years ago" (06/06/2013)  . Shortness of breath     "@ rest; use spiriva qd"  (06/06/2013)  . Rheumatoid arthritis     "shoulders; daughter gives me shots of Methotrexate q week" (06/06/2013)   Past Surgical History  Procedure Laterality Date  . Total hip arthroplasty Left ?2013  . Back surgery    . Fracture surgery  2011    LEFT HIP FX /REPLACEMENT   . Cholecystectomy  1951  . Dilation and curettage of uterus    . Amputation  11/09/2011    Procedure: AMPUTATION DIGIT;  Surgeon: Kathryne Hitch, MD;  Location: [facility] OR;  Service: Orthopedics;  Laterality: Right;  Right Great Toe Amputation  . Amputation  01/05/2012    Procedure: AMPUTATION RAY;  Surgeon: Kathryne Hitch, MD;  Location: [facility] ORS;  Service: Orthopedics;  Laterality: Left;  Amputation Left 4th and 5th Toes  . Cardioversion    . Amputation  05/31/2012    Procedure: AMPUTATION RAY;  Surgeon: Nadara Mustard, MD;  Location[facility] OR;  Service: Orthopedics;  Laterality: Left;  Left foot 1st ray amp  . Amputation  06/21/2012    Procedure: AMPUTATION BELOW KNEE;  Surgeon: Nadara Mustard, MD;  Location: [facility] OR;  Service: Orthopedics;  Laterality: Left;  Left Below Knee Amputation  . Foot amputation through metatarsal Right 06/06/2013    midfoot/notes 06/06/2013  . Joint replacement    . Appendectomy    .  Total abdominal hysterectomy  1975  . Cervical disc surgery      "the top vertebrae" (06/06/2013)  . Cataract extraction w/ intraocular lens  implant, bilateral Bilateral   . Eye surgery      bilateral -cataract surgery   Family History  Problem Relation Age of Onset  . Heart disease Mother   . Stroke Mother   . Heart disease Father   . Stroke Father   . Heart attack Father   . Muscular dystrophy Brother   . Anesthesia problems Neg Hx   . Hypotension Neg Hx   . Malignant hyperthermia Neg Hx   . Pseudochol deficiency Neg Hx    Social History:  reports that she has quit smoking. Her smoking use included Cigarettes. She smoked 0.00 packs per day for 4 years. She has never used smokeless tobacco.  She reports that she does not drink alcohol or use illicit drugs.   Allergies  Allergen Reactions  . Other     Narcotics=makes her very disoriented  . Percocet [Oxycodone-Acetaminophen] Other (See Comments)    Dilerium, hallucinations  . Tramadol Anxiety  . Vicodin [Hydrocodone-Acetaminophen] Anxiety   Medications Prior to Admission  Medication Sig Dispense Refill  . acetaminophen (TYLENOL) 325 MG tablet Take 1-2 tablets (325-650 mg total) by mouth every 4 (four) hours as needed.      . calcium carbonate (OS-CAL) 600 MG TABS Take 600 mg by mouth daily.      . Calcium-Vitamin D-Vitamin K ([employer] CALCIUM SOFT CHEWS PO) Take 2 tablets by mouth daily.      . Cholecalciferol (VITAMIN D3) 400 UNITS CAPS Take 400 Units by mouth daily.       . cyanocobalamin 500 MCG tablet Take 500 mcg by mouth daily.      Marland Kitchen doxycycline (VIBRAMYCIN) 100 MG capsule Take 100 mg by mouth 2 (two) times daily.       . ferrous fumarate-iron polysaccharide complex (TANDEM) 162-115.2 MG CAPS Take 1 capsule by mouth daily with breakfast.  60 capsule  1  . fish oil-omega-3 fatty acids 1000 MG capsule Take 1 g by mouth daily.      . folic acid (FOLVITE) 1 MG tablet Take 1 mg by mouth daily.      . furosemide (LASIX) 20 MG tablet Take 20 mg by mouth daily.      Marland Kitchen gabapentin (NEURONTIN) 300 MG capsule Take 300 mg by mouth 2 (two) times daily.      Marland Kitchen glimepiride (AMARYL) 4 MG tablet Take 2 mg by mouth daily before breakfast.       . levothyroxine (SYNTHROID, LEVOTHROID) 50 MCG tablet Take 50 mcg by mouth daily with breakfast.       . lisinopril-hydrochlorothiazide (PRINZIDE,ZESTORETIC) 20-25 MG per tablet Take 1 tablet by mouth daily with breakfast.       . metFORMIN (GLUCOPHAGE) 500 MG tablet Take 500 mg by mouth at bedtime.       . METHOTREXATE SODIUM IJ Inject 0.4 mLs as directed once a week. On tuesday      . Multiple Vitamin (MULITIVITAMIN WITH MINERALS) TABS Take 1 tablet by mouth daily.      . Multiple Vitamins-Minerals  (ICAPS AREDS FORMULA PO) Take 1 capsule by mouth daily with breakfast.      . nitroGLYCERIN (NITRODUR - DOSED IN MG/24 HR) 0.2 mg/hr patch Place 1 patch onto the skin at bedtime. On the foot      . protein supplement (RESOURCE BENEPROTEIN) POWD Take 1 scoop by  mouth 2 (two) times daily with a meal.      . tiotropium (SPIRIVA) 18 MCG inhalation capsule Place 18 mcg into inhaler and inhale daily with breakfast.       . vitamin C (ASCORBIC ACID) 500 MG tablet Take 500 mg by mouth daily.      Marland Kitchen warfarin (COUMADIN) 4 MG tablet Take 2-4 mg by mouth See admin instructions. Take 2mg  on Monday ,wednesday, Friday.  Take 4mg  on Tuesday, Thursday, Saturday, sunday      . methocarbamol (ROBAXIN) 500 MG tablet Take 1 tablet (500 mg total) by mouth every 6 (six) hours as needed.  45 tablet  1    Home: Home Living Family/patient expects to be discharged to:: Private residence Living Arrangements: Children;Other relatives Available Help at Discharge: Family;Available 24 hours/day Type of Home: House Home Access: Ramped entrance Home Layout: One level Home Equipment: Wheelchair - [employer] - 2 wheels (prosthesis)  Functional History: Prior Function Comments: Mostly has pivotted for transfers since the BKA Functional Status:  Mobility: Bed Mobility Bed Mobility: Supine to Sit Supine to Sit: 4: Min assist;With rails Transfers Transfers: Lateral/Scoot Transfers Lateral/Scoot Transfers: 3: Mod assist;With armrests removed      ADL:    Cognition: Cognition Overall Cognitive Status: Within Functional Limits for tasks assessed Orientation Level: Oriented X4 Cognition Arousal/Alertness: Awake/alert Behavior During Therapy: WFL for tasks assessed/performed Overall Cognitive Status: Within Functional Limits for tasks assessed  Blood pressure 126/45, pulse 73, temperature 97.9 F (36.6 C), temperature source Oral, resp. rate 18, SpO2 98.00%. Physical Exam  Constitutional: She appears  well-developed and well-nourished.  HENT:  Head: Normocephalic and atraumatic.  Eyes: Conjunctivae and EOM are normal. Pupils are equal, round, and reactive to light.  Neck: No JVD present. No tracheal deviation present. No thyromegaly present.  Cardiovascular: Normal rate.   Respiratory: Effort normal.  GI: She exhibits no distension. There is no tenderness.  Musculoskeletal:  Left BKA stump well healed. Right foot dressed, somewhat tender. Remainder of leg is warm and intact.   Lymphadenopathy:    She has no cervical adenopathy.  Neurological: No cranial nerve deficit. Coordination normal.  5/5 strength UE's. RUE is 3-4/5. LLE is4/5  Psychiatric: She has a normal mood and affect. Her behavior is normal. Judgment and thought content normal.    Results for orders placed during the hospital encounter of 06/06/13 (from the past 24 hour(s))  GLUCOSE, CAPILLARY     Status: Abnormal   Collection Time    06/08/13 11:23 AM      Result Value Range   Glucose-Capillary 231 (*) 70 - 99 mg/dL  GLUCOSE, CAPILLARY     Status: Abnormal   Collection Time    06/08/13  4:37 PM      Result Value Range   Glucose-Capillary 168 (*) 70 - 99 mg/dL  GLUCOSE, CAPILLARY     Status: Abnormal   Collection Time    06/08/13 10:02 PM      Result Value Range   Glucose-Capillary 186 (*) 70 - 99 mg/dL  PROTIME-INR     Status: Abnormal   Collection Time    06/09/13  5:32 AM      Result Value Range   Prothrombin Time 29.9 (*) 11.6 - 15.2 seconds   INR 2.98 (*) 0.00 - 1.49  BASIC METABOLIC PANEL     Status: Abnormal   Collection Time    06/09/13  5:32 AM      Result Value Range   Sodium 138  135 -  145 mEq/L   Potassium 3.9  3.5 - 5.1 mEq/L   Chloride 103  96 - 112 mEq/L   CO2 25  19 - 32 mEq/L   Glucose, Bld 91  70 - 99 mg/dL   BUN 30 (*) 6 - 23 mg/dL   Creatinine, Ser 1.61  0.50 - 1.10 mg/dL   Calcium 7.8 (*) 8.4 - 10.5 mg/dL   GFR calc non Af Amer 47 (*) >90 mL/min   GFR calc Af Amer 54 (*) >90  mL/min  CBC     Status: Abnormal   Collection Time    06/09/13  5:32 AM      Result Value Range   WBC 5.9  4.0 - 10.5 K/uL   RBC 2.30 (*) 3.87 - 5.11 MIL/uL   Hemoglobin 7.4 (*) 12.0 - 15.0 g/dL   HCT 09.6 (*) 04.5 - 40.9 %   MCV 94.3  78.0 - 100.0 fL   MCH 32.2  26.0 - 34.0 pg   MCHC 34.1  30.0 - 36.0 g/dL   RDW 81.1  91.4 - 78.2 %   Platelets 153  150 - 400 K/uL  GLUCOSE, CAPILLARY     Status: None   Collection Time    06/09/13  6:49 AM      Result Value Range   Glucose-Capillary 90  70 - 99 mg/dL   No results found.  Assessment/Plan: Diagnosis: right mid foot amputation 1. Does the need for close, 24 hr/day medical supervision in concert with the patient's rehab needs make it unreasonable for this patient to be served in a less intensive setting? Yes 2. Co-Morbidities requiring supervision/potential complications: afib, htn, dm, left BKA 3. Due to bladder management, bowel management, safety, skin/wound care, disease management, medication administration, pain management and patient education, does the patient require 24 hr/day rehab nursing? Yes 4. Does the patient require coordinated care of a physician, rehab nurse, PT (1-2 hrs/day, 5 days/week) and OT (1-2 hrs/day, 5 days/week) to address physical and functional deficits in the context of the above medical diagnosis(es)? Yes Addressing deficits in the following areas: balance, endurance, locomotion, strength, transferring, bowel/bladder control, bathing, dressing, feeding, grooming and toileting 5. Can the patient actively participate in an intensive therapy program of at least 3 hrs of therapy per day at least 5 days per week? Yes 6. The potential for patient to make measurable gains while on inpatient rehab is good 7. Anticipated functional outcomes upon discharge from inpatient rehab are mod I with PT, mod I to min assist with OT, n/a with SLP. 8. Estimated rehab length of stay to reach the above functional goals is: 13-20  days 9. Does the patient have adequate social supports to accommodate these discharge functional goals? Yes 10. Anticipated D/C setting: Home 11. Anticipated post D/C treatments: HH therapy 12. Overall Rehab/Functional Prognosis: excellent  RECOMMENDATIONS: This patient's condition is appropriate for continued rehabilitative care in the following setting: CIR Patient has agreed to participate in recommended program. Yes Note that insurance prior authorization may be required for reimbursement for recommended care.  Comment: Pt never was trained in the use of her left BK prosthesis due to ongoing wound issues on the left left leg. Will need to focus on sliding board, scoot techniques during this rehab admission.  Rehab RN to follow up.   Ranelle Oyster, MD, [place]     06/09/2013

## 2013-06-06 NOTE — Anesthesia Postprocedure Evaluation (Signed)
  Anesthesia Post-op Note  Patient: Sydney [name]  Procedure(s) Performed: Procedure(s): RIGHT MIDFOOT AMPUTATION (Right)  Patient Location: PACU  Anesthesia Type:General  Level of Consciousness: awake, alert  and oriented  Airway and Oxygen Therapy: Patient Spontanous Breathing and Patient connected to nasal cannula oxygen  Post-op Pain: mild  Post-op Assessment: Post-op Vital signs reviewed  Post-op Vital Signs: Reviewed  Complications: No apparent anesthesia complications

## 2013-06-06 NOTE — Anesthesia Preprocedure Evaluation (Addendum)
Anesthesia Evaluation  Patient identified by MRN, date of birth, ID band Patient awake    Reviewed: Allergy & Precautions, H&P , NPO status , Patient's Chart, lab work & pertinent test results  History of Anesthesia Complications Negative for: history of anesthetic complications  Airway Mallampati: II  Neck ROM: Full    Dental  (+) Dental Advisory Given   Pulmonary asthma , COPD COPD inhaler, former smoker,  breath sounds clear to auscultation  Pulmonary exam normal       Cardiovascular hypertension, Pt. on medications + Peripheral Vascular Disease + dysrhythmias (INR 2.22) Atrial Fibrillation Rhythm:Irregular Rate:Normal  '11 ECHO: normal LVF, mild AI   Neuro/Psych negative neurological ROS     GI/Hepatic   Endo/Other  diabetes (glu 100)Hypothyroidism   Renal/GU Renal InsufficiencyRenal disease (creat 1.52)     Musculoskeletal   Abdominal   Peds  Hematology  (+) Blood dyscrasia (Hb 10.3), anemia ,   Anesthesia Other Findings   Reproductive/Obstetrics                         Anesthesia Physical Anesthesia Plan  ASA: III  Anesthesia Plan: General   Post-op Pain Management:    Induction: Intravenous  Airway Management Planned: LMA  Additional Equipment:   Intra-op Plan:   Post-operative Plan:   Informed Consent: I have reviewed the patients History and Physical, chart, labs and discussed the procedure including the risks, benefits and alternatives for the proposed anesthesia with the patient or authorized representative who has indicated his/her understanding and acceptance.   Dental advisory given  Plan Discussed with: CRNA and Surgeon  Anesthesia Plan Comments: (Plan routine monitors, GA- LMA OK)        Anesthesia Quick Evaluation

## 2013-06-06 NOTE — Preoperative (Signed)
Beta Blockers   Reason not to administer Beta Blockers:Not Applicable 

## 2013-06-06 NOTE — Progress Notes (Signed)
Orthopedic Tech Progress Note Patient Details:  Sydney [name] 1921-08-11 [id number]  Ortho Devices Type of Ortho Device: Postop shoe/boot Ortho Device/Splint Interventions: Application   [name] 06/06/2013, 3:05 PM

## 2013-06-06 NOTE — Transfer of Care (Signed)
Immediate Anesthesia Transfer of Care Note  Patient: Sydney [name]  Procedure(s) Performed: Procedure(s): RIGHT MIDFOOT AMPUTATION (Right)  Patient Location: PACU  Anesthesia Type:General  Level of Consciousness: awake, alert  and oriented  Airway & Oxygen Therapy: Patient Spontanous Breathing  Post-op Assessment: Report given to PACU RN  Post vital signs: Reviewed and stable  Complications: No apparent anesthesia complications

## 2013-06-06 NOTE — Progress Notes (Signed)
ANTICOAGULATION CONSULT NOTE - Initial Consult  Pharmacy Consult for Coumadin Indication: atrial fibrillation  Allergies  Allergen Reactions  . Other     Narcotics=makes her very disoriented  . Percocet [Oxycodone-Acetaminophen] Other (See Comments)    Dilerium, hallucinations  . Tramadol Anxiety  . Vicodin [Hydrocodone-Acetaminophen] Anxiety    Patient Measurements:   Heparin Dosing Weight: n/a  Vital Signs: Temp: 97.2 F (36.2 C) (10/24 1542) Temp src: Oral (10/24 1144) BP: 118/42 mmHg (10/24 1542) Pulse Rate: 74 (10/24 1542)  Labs:  Recent Labs  06/06/13 1153  HGB 10.3*  HCT 31.2*  PLT 226  APTT 51*  LABPROT 23.9*  INR 2.22*  CREATININE 1.52*    The CrCl is unknown because both a height and weight (above a minimum accepted value) are required for this calculation.   Medical History: Past Medical History  Diagnosis Date  . Lung disease     BRONCHOSPASTIC  . Neuropathy     takes Gabapentin bid  . Hypercholesterolemia     doesn't require meds for this  . Anemia   . COPD (chronic obstructive pulmonary disease)   . Blood transfusion 2012 ?APRIL  . Hypertension     takes Prinizide daily  . Chronic atrial fibrillation     takes Coumadin as instructed  . Peripheral vascular disease   . CHF (congestive heart failure)     takes Lasix every other day  . Shortness of breath     uses spiriva daily;with exertion  . Emphysema   . Pneumonia     hx of   . History of head injury     many yrs ago  . Arthritis     rheumatoid and takes Methotrexate weekly  . Bruises easily   . Open wound     with a foul odor-left big toe  . History of bladder infections   . Vaginal discharge     has seen GYN but says its nothing to worry about  . History of blood transfusion   . Diabetes mellitus     takes Metformin and Amaryl daily  . Hypothyroidism     takes Synthroid daily  . Macular degeneration     dry    Medications:  Scheduled:  .  ceFAZolin (ANCEF) IV  1 g  Intravenous Q6H  . fentaNYL      . furosemide  20 mg Oral Daily  . gabapentin  300 mg Oral BID  . [START ON 06/07/2013] glimepiride  2 mg Oral QAC breakfast  . insulin aspart  0-9 Units Subcutaneous TID WC  . [START ON 06/07/2013] levothyroxine  50 mcg Oral Q breakfast  . [START ON 06/07/2013] lisinopril-hydrochlorothiazide  1 tablet Oral Q breakfast  . metFORMIN  500 mg Oral QHS  . protein supplement  1 scoop Oral BID WC  . [START ON 06/07/2013] tiotropium  18 mcg Inhalation Q breakfast  . warfarin  2-4 mg Oral See admin instructions    Assessment: 79 yo female on chronic Coumadin for afib admitted for abscess ulceration of R forefoot, s/p midfoot amputation today.  Today's INR 2.22.  No bleeding or complications noted.  PTA Coumadin dose: 4 mg daily except 2 mg on MWF.  Last dose taken 10/23. (spoke to patients daughter to confirm).  Daughter says INR was 2.3 as outpatient on Wed 10/22.  Goal of Therapy:  INR 2-3 Monitor platelets by anticoagulation protocol: Yes   Plan:  1. Continue Coumadin at home dose. 2. Continue daily INR for now.  3. Will monitor for any signs or symptoms of bleeding.  Tad Moore, BCPS  Clinical Pharmacist Pager [phone]  06/06/2013 3:58 PM

## 2013-06-06 NOTE — Progress Notes (Signed)
Plan for admission over the weekend with possible discharge to home on Monday.

## 2013-06-07 DIAGNOSIS — N289 Disorder of kidney and ureter, unspecified: Secondary | ICD-10-CM | POA: Clinically undetermined

## 2013-06-07 DIAGNOSIS — Z89431 Acquired absence of right foot: Secondary | ICD-10-CM

## 2013-06-07 LAB — PROTIME-INR: Prothrombin Time: 26.5 seconds — ABNORMAL HIGH (ref 11.6–15.2)

## 2013-06-07 MED ORDER — POTASSIUM CHLORIDE IN NACL 20-0.9 MEQ/L-% IV SOLN
INTRAVENOUS | Status: DC
  Administered 2013-06-07: 16:00:00 via INTRAVENOUS
  Administered 2013-06-08: 1000 mL via INTRAVENOUS
  Administered 2013-06-08: 02:00:00 via INTRAVENOUS
  Filled 2013-06-07 (×5): qty 1000

## 2013-06-07 MED ORDER — ACETAMINOPHEN 500 MG PO TABS
1000.0000 mg | ORAL_TABLET | Freq: Four times a day (QID) | ORAL | Status: AC
  Administered 2013-06-07 – 2013-06-08 (×2): 1000 mg via ORAL
  Filled 2013-06-07 (×2): qty 2

## 2013-06-07 MED ORDER — ACETAMINOPHEN 10 MG/ML IV SOLN
1000.0000 mg | Freq: Four times a day (QID) | INTRAVENOUS | Status: DC
  Administered 2013-06-07: 1000 mg via INTRAVENOUS

## 2013-06-07 NOTE — Progress Notes (Signed)
   Subjective:  Patient reports pain as severe.  No events  Objective:   VITALS:   Filed Vitals:   06/06/13 1542 06/06/13 2115 06/07/13 0708 06/07/13 1335  BP: 118/42 124/52 121/41 129/67  Pulse: 74 81 93 88  Temp: 97.2 F (36.2 C) 98.4 F (36.9 C) 98.8 F (37.1 C) 98.7 F (37.1 C)  TempSrc:    Oral  Resp: 14 14 15 18   SpO2: 98% 99% 100% 96%    Neurologically intact Neurovascular intact Sensation intact distally Intact pulses distally Dorsiflexion/Plantar flexion intact Incision: dressing C/D/I and no drainage No cellulitis present Compartment soft   Lab Results  Component Value Date   WBC 8.9 06/06/2013   HGB 10.3* 06/06/2013   HCT 31.2* 06/06/2013   MCV 94.3 06/06/2013   PLT 226 06/06/2013     Assessment/Plan: 1 Day Post-Op   Problem List Items Addressed This Visit   None      Advance diet Up with therapy Consult medicine for ARF - appreciate their help NWB DVT ppx Pain control Discharge planning   Cheral Almas 06/07/2013, 8:49 PM [phone]

## 2013-06-07 NOTE — Progress Notes (Signed)
ANTICOAGULATION CONSULT NOTE - Follow up  Pharmacy Consult for Coumadin Indication: atrial fibrillation  Allergies  Allergen Reactions  . Other     Narcotics=makes her very disoriented  . Percocet [Oxycodone-Acetaminophen] Other (See Comments)    Dilerium, hallucinations  . Tramadol Anxiety  . Vicodin [Hydrocodone-Acetaminophen] Anxiety    Patient Measurements:   Heparin Dosing Weight: n/a  Vital Signs: Temp: 98.7 F (37.1 C) (10/25 1335) Temp src: Oral (10/25 1335) BP: 129/67 mmHg (10/25 1335) Pulse Rate: 88 (10/25 1335)  Labs:  Recent Labs  06/06/13 1153 06/07/13 0507  HGB 10.3*  --   HCT 31.2*  --   PLT 226  --   APTT 51*  --   LABPROT 23.9* 26.5*  INR 2.22* 2.54*  CREATININE 1.52*  --     The CrCl is unknown because both a height and weight (above a minimum accepted value) are required for this calculation.   Medical History: Past Medical History  Diagnosis Date  . Lung disease     BRONCHOSPASTIC  . Neuropathy     takes Gabapentin bid  . Hypercholesterolemia     doesn't require meds for this  . Anemia   . COPD (chronic obstructive pulmonary disease)   . Blood transfusion ~ 2012    "once; not related to a surgery or bleeding that I remember" (06/06/2013)  . Hypertension     takes Prinizide daily  . Chronic atrial fibrillation     takes Coumadin as instructed  . Peripheral vascular disease   . CHF (congestive heart failure)     takes Lasix every other day  . Emphysema   . History of head injury     many yrs ago  . Bruises easily   . History of bladder infections   . Vaginal discharge     has seen GYN but says its nothing to worry about  . Diabetes mellitus     takes Metformin and Amaryl daily  . Hypothyroidism     takes Synthroid daily  . Macular degeneration     dry  . Bronchial pneumonia     "once; several years ago" (06/06/2013)  . Shortness of breath     "@ rest; use spiriva qd" (06/06/2013)  . Rheumatoid arthritis    "shoulders; daughter gives me shots of Methotrexate q week" (06/06/2013)    Medications:  Scheduled:  . acetaminophen  1,000 mg Oral Q6H  . furosemide  20 mg Oral Daily  . gabapentin  300 mg Oral BID  . glimepiride  2 mg Oral QAC breakfast  . lisinopril  20 mg Oral Q breakfast   And  . hydrochlorothiazide  25 mg Oral Q breakfast  . insulin aspart  0-9 Units Subcutaneous TID WC  . levothyroxine  50 mcg Oral Q breakfast  . protein supplement  1 scoop Oral BID WC  . tiotropium  18 mcg Inhalation Q breakfast  . warfarin  4 mg Oral Q T,Th,S,Su-1800   And  . warfarin  2 mg Oral Q M,W,F-1800  . Warfarin - Pharmacist Dosing Inpatient   Does not apply q1800    Assessment: 79 yo female on chronic Coumadin for afib admitted for abscess ulceration of R forefoot, s/p midfoot amputation yesterday.  Today's INR therapeutic at 2.54.  No bleeding or complications reported.  PTA Coumadin dose: 4 mg daily except 2 mg on MWF.  Last home dose taken 10/23. (was confirmed with patient's daughter). Daughter reports INR was 2.3 as outpatient on Wed 10/22.  Goal of Therapy:  INR 2-3 Monitor platelets by anticoagulation protocol: Yes   Plan:  - continue Coumadin at home dose. - continue daily INR for now. - monitor for any signs or symptoms of bleeding.  Shelba Flake Achilles Dunk, PharmD Clinical Pharmacist - Resident Pager: [phone] Pharmacy: [phone] 06/07/2013 2:46 PM

## 2013-06-07 NOTE — Progress Notes (Signed)
PT Cancellation Note  Patient Details Name: Sydney [name] MRN: [id number] DOB: [birth year]   Cancelled Treatment:    Reason Eval/Treat Not Completed: Pain limiting ability to participate. Attempted to see and evaluate pt x 2; pt and daughter were present and refusing. Pt daughter reports pt is having the most pain she's ever experienced and they are not able to control it. Will re-attempt at next available time when pt has pain under control.    Donnamarie Poag Harris, [place] [phone] 06/07/2013, 12:32 PM

## 2013-06-07 NOTE — Progress Notes (Signed)
Reason for Consult:  Acute renal insufficiency Referring Physician:  Hendy [name] is an 79 y.o. female.  HPI: Patient is a delightful 79 year old Caucasian woman who was recently admitted to the hospital to have a right midfoot amputation done because of nonhealing diabetic foot ulcer. She has a history of peripheral vascular disease. She also has a history of hypertension, COPD, atrial fibrillation, and congestive heart failure. She tolerated her procedure well which was done on October 24, however labs indicate that her serum creatinine level has risen from her baseline of 0.88 on 06/25/2012 to the current level of 1.52.  Currently, she feels fine other than continuing to have moderate pain at the right foot surgical site. Unfortunately, she does not tolerate narcotic medications well. She denies recent problems with shortness of breath, chest pain, palpitations, abdominal pain, nausea, constipation, dysuria, or frequency.  Past Medical History  Diagnosis Date  . Lung disease     BRONCHOSPASTIC  . Neuropathy     takes Gabapentin bid  . Hypercholesterolemia     doesn't require meds for this  . Anemia   . COPD (chronic obstructive pulmonary disease)   . Blood transfusion ~ 2012    "once; not related to a surgery or bleeding that I remember" (06/06/2013)  . Hypertension     takes Prinizide daily  . Chronic atrial fibrillation     takes Coumadin as instructed  . Peripheral vascular disease   . CHF (congestive heart failure)     takes Lasix every other day  . Emphysema   . History of head injury     many yrs ago  . Bruises easily   . History of bladder infections   . Vaginal discharge     has seen GYN but says its nothing to worry about  . Diabetes mellitus     takes Metformin and Amaryl daily  . Hypothyroidism     takes Synthroid daily  . Macular degeneration     dry  . Bronchial pneumonia     "once; several years ago" (06/06/2013)  . Shortness of breath     "@  rest; use spiriva qd" (06/06/2013)  . Rheumatoid arthritis     "shoulders; daughter gives me shots of Methotrexate q week" (06/06/2013)    Past Surgical History  Procedure Laterality Date  . Total hip arthroplasty Left ?2013  . Back surgery    . Fracture surgery  2011    LEFT HIP FX /REPLACEMENT   . Cholecystectomy  1951  . Dilation and curettage of uterus    . Amputation  11/09/2011    Procedure: AMPUTATION DIGIT;  Surgeon: Kathryne Hitch, MD;  Location: [facility] OR;  Service: Orthopedics;  Laterality: Right;  Right Great Toe Amputation  . Amputation  01/05/2012    Procedure: AMPUTATION RAY;  Surgeon: Kathryne Hitch, MD;  Location: [facility] ORS;  Service: Orthopedics;  Laterality: Left;  Amputation Left 4th and 5th Toes  . Cardioversion    . Amputation  05/31/2012    Procedure: AMPUTATION RAY;  Surgeon: Nadara Mustard, MD;  Location: [facility] OR;  Service: Orthopedics;  Laterality: Left;  Left foot 1st ray amp  . Amputation  06/21/2012    Procedure: AMPUTATION BELOW KNEE;  Surgeon: Nadara Mustard, MD;  Location: [facility] OR;  Service: Orthopedics;  Laterality: Left;  Left Below Knee Amputation  . Foot amputation through metatarsal Right 06/06/2013    midfoot/notes 06/06/2013  . Joint replacement    .  Appendectomy    . Total abdominal hysterectomy  1975  . Cervical disc surgery      "the top vertebrae" (06/06/2013)  . Cataract extraction w/ intraocular lens  implant, bilateral Bilateral   . Eye surgery      bilateral -cataract surgery    Family History  Problem Relation Age of Onset  . Heart disease Mother   . Stroke Mother   . Heart disease Father   . Stroke Father   . Heart attack Father   . Muscular dystrophy Brother   . Anesthesia problems Neg Hx   . Hypotension Neg Hx   . Malignant hyperthermia Neg Hx   . Pseudochol deficiency Neg Hx     Social History:  reports that she has quit smoking. Her smoking use included Cigarettes. She smoked 0.00 packs per day for 4 years. She has  never used smokeless tobacco. She reports that she does not drink alcohol or use illicit drugs.  Allergies:  Allergies  Allergen Reactions  . Other     Narcotics=makes her very disoriented  . Percocet [Oxycodone-Acetaminophen] Other (See Comments)    Dilerium, hallucinations  . Tramadol Anxiety  . Vicodin [Hydrocodone-Acetaminophen] Anxiety    Medications: I have reviewed the patient's current medications.  Results for orders placed during the hospital encounter of 06/06/13 (from the past 48 hour(s))  APTT     Status: Abnormal   Collection Time    06/06/13 11:53 AM      Result Value Range   aPTT 51 (*) 24 - 37 seconds   Comment:            IF BASELINE aPTT IS ELEVATED,     SUGGEST PATIENT RISK ASSESSMENT     BE USED TO DETERMINE APPROPRIATE     ANTICOAGULANT THERAPY.  CBC     Status: Abnormal   Collection Time    06/06/13 11:53 AM      Result Value Range   WBC 8.9  4.0 - 10.5 K/uL   RBC 3.31 (*) 3.87 - 5.11 MIL/uL   Hemoglobin 10.3 (*) 12.0 - 15.0 g/dL   HCT 16.1 (*) 09.6 - 04.5 %   MCV 94.3  78.0 - 100.0 fL   MCH 31.1  26.0 - 34.0 pg   MCHC 33.0  30.0 - 36.0 g/dL   RDW 40.9  81.1 - 91.4 %   Platelets 226  150 - 400 K/uL  COMPREHENSIVE METABOLIC PANEL     Status: Abnormal   Collection Time    06/06/13 11:53 AM      Result Value Range   Sodium 137  135 - 145 mEq/L   Potassium 4.9  3.5 - 5.1 mEq/L   Comment: HEMOLYSIS AT THIS LEVEL MAY AFFECT RESULT   Chloride 97  96 - 112 mEq/L   CO2 26  19 - 32 mEq/L   Glucose, Bld 107 (*) 70 - 99 mg/dL   BUN 34 (*) 6 - 23 mg/dL   Creatinine, Ser 7.82 (*) 0.50 - 1.10 mg/dL   Calcium 9.6  8.4 - 95.6 mg/dL   Total Protein 7.4  6.0 - 8.3 g/dL   Albumin 3.7  3.5 - 5.2 g/dL   AST 37  0 - 37 U/L   ALT 13  0 - 35 U/L   Alkaline Phosphatase 69  39 - 117 U/L   Total Bilirubin 0.4  0.3 - 1.2 mg/dL   GFR calc non Af Amer 29 (*) >90 mL/min   GFR calc Af  Amer 34 (*) >90 mL/min   Comment: (NOTE)     The eGFR has been calculated using  the CKD EPI equation.     This calculation has not been validated in all clinical situations.     eGFR's persistently <90 mL/min signify possible Chronic Kidney     Disease.  PROTIME-INR     Status: Abnormal   Collection Time    06/06/13 11:53 AM      Result Value Range   Prothrombin Time 23.9 (*) 11.6 - 15.2 seconds   INR 2.22 (*) 0.00 - 1.49  GLUCOSE, CAPILLARY     Status: Abnormal   Collection Time    06/06/13 11:56 AM      Result Value Range   Glucose-Capillary 100 (*) 70 - 99 mg/dL  GLUCOSE, CAPILLARY     Status: None   Collection Time    06/06/13 12:42 PM      Result Value Range   Glucose-Capillary 89  70 - 99 mg/dL  GLUCOSE, CAPILLARY     Status: Abnormal   Collection Time    06/06/13  2:22 PM      Result Value Range   Glucose-Capillary 103 (*) 70 - 99 mg/dL  GLUCOSE, CAPILLARY     Status: None   Collection Time    06/06/13  4:11 PM      Result Value Range   Glucose-Capillary 87  70 - 99 mg/dL   Comment 1 Notify RN    GLUCOSE, CAPILLARY     Status: Abnormal   Collection Time    06/06/13  9:18 PM      Result Value Range   Glucose-Capillary 113 (*) 70 - 99 mg/dL  GLUCOSE, CAPILLARY     Status: Abnormal   Collection Time    06/07/13  3:58 AM      Result Value Range   Glucose-Capillary 118 (*) 70 - 99 mg/dL  PROTIME-INR     Status: Abnormal   Collection Time    06/07/13  5:07 AM      Result Value Range   Prothrombin Time 26.5 (*) 11.6 - 15.2 seconds   INR 2.54 (*) 0.00 - 1.49  GLUCOSE, CAPILLARY     Status: Abnormal   Collection Time    06/07/13  7:00 AM      Result Value Range   Glucose-Capillary 123 (*) 70 - 99 mg/dL  GLUCOSE, CAPILLARY     Status: Abnormal   Collection Time    06/07/13 11:16 AM      Result Value Range   Glucose-Capillary 181 (*) 70 - 99 mg/dL    Dg Chest 2 View  19/14/7829   CLINICAL DATA:  Preoperative evaluation prior right foot amputation.  EXAM: CHEST  2 VIEW  COMPARISON:  CHEST x-ray 10/16/2011.  FINDINGS: Lung volumes are  normal. No consolidative airspace disease. No pleural effusions. No pneumothorax. No pulmonary nodule or mass noted. Pulmonary vasculature and the cardiomediastinal silhouette are within normal limits. Mild bilateral apical pleuroparenchymal thickening and calcification, compatible with chronic pleural parenchymal scarring (unchanged). Atherosclerosis in the thoracic aorta.  IMPRESSION: 1.  No radiographic evidence of acute cardiopulmonary disease. 2. Atherosclerosis.   Electronically Signed   By: Trudie Reed M.D.   On: 06/06/2013 12:40    ROS:  She has had decreased appetite lately along with her moderate right foot pain. She has not had fever, chills, abdominal pain, nausea, vomiting, diarrhea, constipation, dysuria, or frequency.   Blood pressure 129/67, pulse 88, temperature 98.7 F (  37.1 C), temperature source Oral, resp. rate 18, SpO2 96.00%.  PHYSICAL EXAM: in general, she is a frail elderly white woman who was in no apparent distress while lying partially upright in bed. She is a good historian. HEENT exam was within normal limits, neck was supple without jugular venous distention, chest was clear to auscultation, heart had a slightly irregular rhythm with a systolic ejection murmur of grade 2/6 at the left sternal border, abdomen had normal bowel sounds and no tenderness, had 1+ right leg edema with mild proximal erythema, and the right foot is wrapped with Profore. She is status post left below knee amputation. She was alert and well oriented with normal affect and able to move all extremities well.   Assessment/Plan: #1 Acute Renal Insufficiency: She has had a decline in her renal function as evidenced by her increase in serum creatinine level. This is most likely from mild intravascular volume depletion with ongoing ACE inhibitor treatment. Her symptoms are less likely from a post obstructive etiology or medication. We will discontinue her ace inhibitor treatment and diuretic for now and  add gentle IV fluids and monitor her BUN and creatinine levels closely.  #2 DM2: Stable on sliding scale insulin #3 Atrial Fibrillation:  She has a stable ventricular rate and stroke prophylaxis with Coumadin.  Shanai Lartigue G 06/07/2013, 2:46 PM

## 2013-06-08 LAB — BASIC METABOLIC PANEL
BUN: 35 mg/dL — ABNORMAL HIGH (ref 6–23)
CO2: 25 mEq/L (ref 19–32)
Calcium: 8 mg/dL — ABNORMAL LOW (ref 8.4–10.5)
Creatinine, Ser: 1.08 mg/dL (ref 0.50–1.10)
Glucose, Bld: 126 mg/dL — ABNORMAL HIGH (ref 70–99)

## 2013-06-08 LAB — PROTIME-INR: INR: 3.21 — ABNORMAL HIGH (ref 0.00–1.49)

## 2013-06-08 LAB — GLUCOSE, CAPILLARY
Glucose-Capillary: 168 mg/dL — ABNORMAL HIGH (ref 70–99)
Glucose-Capillary: 186 mg/dL — ABNORMAL HIGH (ref 70–99)

## 2013-06-08 MED ORDER — SENNOSIDES-DOCUSATE SODIUM 8.6-50 MG PO TABS
1.0000 | ORAL_TABLET | Freq: Every morning | ORAL | Status: DC
  Administered 2013-06-08 – 2013-06-09 (×2): 1 via ORAL
  Filled 2013-06-08 (×2): qty 1

## 2013-06-08 NOTE — Evaluation (Signed)
Physical Therapy Evaluation Patient Details Name: Sydney [name] MRN: [id number] DOB: 1921-03-05 Today's Date: 06/08/2013 Time: 4098-1191 PT Time Calculation (min): 28 min  PT Assessment / Plan / Recommendation History of Present Illness  s/p R transmet amputation; Of note, pt has L BKA, pretty recent, has prosthesis, but has not begun prosthesis training yet due to complications with healing, and now this admission  Clinical Impression  Patient is s/p above surgery resulting in functional limitations due to the deficits listed below (see PT Problem List).  In addidtion, it is time to begin prosthesis training on her previous L BKA -- which is good timing, as pt is NWBing on her L foot; She is well-versed in pivot transfers, and will be an excellent Rehab Candidate;  Patient will benefit from skilled PT to increase their independence and safety with mobility to allow discharge to the venue listed below.       PT Assessment  Patient needs continued PT services    Follow Up Recommendations  CIR    Does the patient have the potential to tolerate intense rehabilitation      Barriers to Discharge        Equipment Recommendations  None recommended by PT    Recommendations for Other Services Rehab consult;OT consult   Frequency Min 5X/week    Precautions / Restrictions Precautions Precautions: Fall Restrictions RLE Weight Bearing: Non weight bearing   Pertinent Vitals/Pain 6/10 L foot; patient repositioned for comfort elevated for edema and pain control       Mobility  Bed Mobility Bed Mobility: Supine to Sit Supine to Sit: 4: Min assist;With rails Details for Bed Mobility Assistance: Overall smooth transitions; Cues for technique Transfers Transfers: Lateral/Scoot Transfers Lateral/Scoot Transfers: 3: Mod assist;With armrests removed Details for Transfer Assistance: Demo, verbal, and tactile cueing for technique; overall postioning of hands for pressing up; cues also for  position of hip in relation to edge of chair; good job kee;ing foot up    Exercises     PT Diagnosis: Acute pain;Difficulty walking  PT Problem List: Decreased strength;Decreased activity tolerance;Decreased balance;Decreased mobility;Decreased knowledge of use of DME;Decreased knowledge of precautions;Pain;Decreased range of motion;Decreased coordination PT Treatment Interventions: DME instruction;Functional mobility training;Therapeutic activities;Therapeutic exercise;Patient/family education;Other (comment) (Prosthetic training)     PT Goals(Current goals can be found in the care plan section) Acute Rehab PT Goals Patient Stated Goal: For R foot to heal and be able to use her prosthesis to pivot transfer on Left while R foot heals PT Goal Formulation: With patient Time For Goal Achievement: 06/22/13 Potential to Achieve Goals: Good  Visit Information  Last PT Received On: 06/08/13 Assistance Needed: +2 (helpful) History of Present Illness: s/p R transmet amputation; Of note, pt has L BKA, pretty recent, has prosthesis, but has not begun prosthesis training yet due to complications with healing, and now this admission       Prior Functioning  Home Living Family/patient expects to be discharged to:: Private residence Living Arrangements: Children;Other relatives Available Help at Discharge: Family;Available 24 hours/day Type of Home: House Home Access: Ramped entrance Home Layout: One level Home Equipment: Wheelchair - [employer] - 2 wheels (prosthesis) Prior Function Comments: Mostly has pivotted for transfers since the BKA Communication Communication: No difficulties Dominant Hand: Right    Cognition  Cognition Arousal/Alertness: Awake/alert Behavior During Therapy: WFL for tasks assessed/performed Overall Cognitive Status: Within Functional Limits for tasks assessed    Extremity/Trunk Assessment Upper Extremity Assessment Upper Extremity Assessment: Overall WFL for  tasks  assessed Lower Extremity Assessment Lower Extremity Assessment: RLE deficits/detail;LLE deficits/detail RLE Deficits / Details: NWBing; able to perform straight leg raise; hip and knee AROM WFL RLE: Unable to fully assess due to immobilization (bulky dressing foot and ankle) LLE Deficits / Details: BKA   Balance    End of Session PT - End of Session Equipment Utilized During Treatment: Other (comment) (bed pad) Activity Tolerance: Patient tolerated treatment well Patient left: in chair;with call bell/phone within reach;with family/visitor present Nurse Communication: Mobility status  GP     Olen Pel [place], [place] [phone]  06/08/2013, 4:26 PM

## 2013-06-08 NOTE — Progress Notes (Signed)
Clinical Social Work Department CLINICAL SOCIAL WORK PLACEMENT NOTE 06/08/2013  Patient:  Sydney [name], Sydney [name]  Account Number:  [id number] Admit date:  06/06/2013  Clinical Social Worker:  Jetta Lout, Theresia Majors  Date/time:  06/08/2013 06:05 PM  Clinical Social Work is seeking post-discharge placement for this patient at the following level of care:   SKILLED NURSING   (*CSW will update this form in Epic as items are completed)   06/08/2013  Patient/family provided with [facility] Department of Clinical Social Work's list of facilities offering this level of care within the geographic area requested by the patient (or if unable, by the patient's family).  06/08/2013  Patient/family informed of their freedom to choose among providers that offer the needed level of care, that participate in Medicare, Medicaid or managed care program needed by the patient, have an available bed and are willing to accept the patient.  06/08/2013  Patient/family informed of [facility]' ownership interest in [facility], as well as of the fact that they are under no obligation to receive care at this facility.  PASARR submitted to EDS on 06/13/2010 PASARR number received from [employer] on 06/14/2010  FL2 transmitted to all facilities in geographic area requested by pt/family on  06/08/2013 FL2 transmitted to all facilities within larger geographic area on   Patient informed that his/her managed care company has contracts with or will negotiate with  certain facilities, including the following:     Patient/family informed of bed offers received:   Patient chooses bed at  Physician recommends and patient chooses bed at    Patient to be transferred to  on   Patient to be transferred to facility by   The following physician request were entered in Epic:   Additional Comments: Daughter agreeable to [employer] and [employer] as a back up to [employer].

## 2013-06-08 NOTE — Progress Notes (Signed)
Subjective: She feels much better with improved energy, is not having any dyspnea, appetite is good although she has not had a BM since admission to the hospital  Objective: Vital signs in last 24 hours: Temp:  [98.7 F (37.1 C)-98.9 F (37.2 C)] 98.8 F (37.1 C) (10/26 0518) Pulse Rate:  [83-88] 85 (10/26 0518) Resp:  [16-18] 16 (10/26 0518) BP: (115-142)/(39-67) 142/39 mmHg (10/26 0518) SpO2:  [95 %-100 %] 95 % (10/26 0823) Weight change:    Intake/Output from previous day: 10/25 0701 - 10/26 0700 In: 875 [P.O.:600; I.V.:275] Out: -    General appearance: alert, cooperative and no distress Resp: clear to auscultation bilaterally Cardio: regular rate and rhythm and with a systolic ejection murmur GI: soft, non-tender; bowel sounds normal; no masses,  no organomegaly  Lab Results:  Recent Labs  06/06/13 1153  WBC 8.9  HGB 10.3*  HCT 31.2*  PLT 226   BMET  Recent Labs  06/06/13 1153 06/08/13 0548  NA 137 137  K 4.9 4.1  CL 97 101  CO2 26 25  GLUCOSE 107* 126*  BUN 34* 35*  CREATININE 1.52* 1.08  CALCIUM 9.6 8.0*   CMET CMP     Component Value Date/Time   NA 137 06/08/2013 0548   K 4.1 06/08/2013 0548   CL 101 06/08/2013 0548   CO2 25 06/08/2013 0548   GLUCOSE 126* 06/08/2013 0548   BUN 35* 06/08/2013 0548   CREATININE 1.08 06/08/2013 0548   CALCIUM 8.0* 06/08/2013 0548   PROT 7.4 06/06/2013 1153   ALBUMIN 3.7 06/06/2013 1153   AST 37 06/06/2013 1153   ALT 13 06/06/2013 1153   ALKPHOS 69 06/06/2013 1153   BILITOT 0.4 06/06/2013 1153   GFRNONAA 44* 06/08/2013 0548   GFRAA 51* 06/08/2013 0548    CBG (last 3)   Recent Labs  06/07/13 1601 06/07/13 2234 06/08/13 0712  GLUCAP 133* 127* 127*    INR RESULTS:   Lab Results  Component Value Date   INR 3.21* 06/08/2013   INR 2.54* 06/07/2013   INR 2.22* 06/06/2013     Studies/Results: Dg Chest 2 View  06/06/2013   CLINICAL DATA:  Preoperative evaluation prior right foot amputation.   EXAM: CHEST  2 VIEW  COMPARISON:  CHEST x-ray 10/16/2011.  FINDINGS: Lung volumes are normal. No consolidative airspace disease. No pleural effusions. No pneumothorax. No pulmonary nodule or mass noted. Pulmonary vasculature and the cardiomediastinal silhouette are within normal limits. Mild bilateral apical pleuroparenchymal thickening and calcification, compatible with chronic pleural parenchymal scarring (unchanged). Atherosclerosis in the thoracic aorta.  IMPRESSION: 1.  No radiographic evidence of acute cardiopulmonary disease. 2. Atherosclerosis.   Electronically Signed   By: Trudie Reed M.D.   On: 06/06/2013 12:40    Medications: I have reviewed the patient's current medications.  Assessment/Plan: #1 Acute Renal Insufficiency: significant improvement with IVF and holding diuretics and her ACE inhibitor. I will slow the rate of her IVF, and she can restart usual meds upon discharge from the acute hospital setting. #2 Constipation: mild and meds will be adjusted   LOS: 2 days   Sydney [name] G 06/08/2013, 9:16 AM

## 2013-06-08 NOTE — Progress Notes (Signed)
Clinical Social Work Department BRIEF PSYCHOSOCIAL ASSESSMENT 06/08/2013  Patient:  Sydney [name], Sydney [name]     Account Number:  [id number]     Admit date:  06/06/2013  Clinical Social Worker:  Hendricks Milo  Date/Time:  06/08/2013 05:59 PM  Referred by:  Physician  Date Referred:  06/08/2013 Referred for  SNF Placement   Other Referral:   Interview type:  Family Other interview type:    PSYCHOSOCIAL DATA Living Status:  WITH ADULT CHILDREN Admitted from facility:   Level of care:   Primary support name:  Raynelle Dick Primary support relationship to patient:  CHILD, ADULT Degree of support available:   Very supportive at bedside.    CURRENT CONCERNS  Other Concerns:    SOCIAL WORK ASSESSMENT / PLAN Clinical Social Worker (CSW) met with patient and patient's daughter. Daughter was agreeable to SNF search in [facility] as a back up to CIR. Daughter reported that patient has been to CIR and that is there first choice. Daughter reported that [facility] and [facility] are possible back ups for CIR. CSW explained to daughter that [employer] very rarely takes people from outside their community. Daughter verbalized her understanding.   Assessment/plan status:  Psychosocial Support/Ongoing Assessment of Needs Other assessment/ plan:   Information/referral to community resources:   CSW gave daughter SNF list.    PATIENT'S/FAMILY'S RESPONSE TO PLAN OF CARE: Patient and daughter were appreciative of CSW visit.

## 2013-06-08 NOTE — Progress Notes (Signed)
   Subjective:  Patient reports pain as much improved.  No events  Objective:   VITALS:   Filed Vitals:   06/07/13 1335 06/07/13 2129 06/08/13 0518 06/08/13 0823  BP: 129/67 115/40 142/39   Pulse: 88 83 85   Temp: 98.7 F (37.1 C) 98.9 F (37.2 C) 98.8 F (37.1 C)   TempSrc: Oral Oral Oral   Resp: 18 16 16    SpO2: 96% 100% 98% 95%    Neurologically intact Neurovascular intact Sensation intact distally Intact pulses distally Dorsiflexion/Plantar flexion intact Incision: dressing C/D/I and no drainage No cellulitis present Compartment soft   Lab Results  Component Value Date   WBC 8.9 06/06/2013   HGB 10.3* 06/06/2013   HCT 31.2* 06/06/2013   MCV 94.3 06/06/2013   PLT 226 06/06/2013     Assessment/Plan: 2 Days Post-Op   Problem List Items Addressed This Visit   None      Advance diet Up with therapy RF resolved - appreciate medicine's assistance  NWB DVT ppx Pain control Discharge planning   Cheral Almas 06/08/2013, 10:43 AM [phone]

## 2013-06-08 NOTE — Progress Notes (Signed)
ANTICOAGULATION CONSULT NOTE - Follow up  Pharmacy Consult for Coumadin Indication: atrial fibrillation  Allergies  Allergen Reactions  . Other     Narcotics=makes her very disoriented  . Percocet [Oxycodone-Acetaminophen] Other (See Comments)    Dilerium, hallucinations  . Tramadol Anxiety  . Vicodin [Hydrocodone-Acetaminophen] Anxiety    Patient Measurements:   Heparin Dosing Weight: n/a  Vital Signs: Temp: 98.8 F (37.1 C) (10/26 0518) Temp src: Oral (10/26 0518) BP: 142/39 mmHg (10/26 0518) Pulse Rate: 85 (10/26 0518)  Labs:  Recent Labs  06/06/13 1153 06/07/13 0507 06/08/13 0548  HGB 10.3*  --   --   HCT 31.2*  --   --   PLT 226  --   --   APTT 51*  --   --   LABPROT 23.9* 26.5* 31.7*  INR 2.22* 2.54* 3.21*  CREATININE 1.52*  --  1.08    The CrCl is unknown because both a height and weight (above a minimum accepted value) are required for this calculation.   Medical History: Past Medical History  Diagnosis Date  . Lung disease     BRONCHOSPASTIC  . Neuropathy     takes Gabapentin bid  . Hypercholesterolemia     doesn't require meds for this  . Anemia   . COPD (chronic obstructive pulmonary disease)   . Blood transfusion ~ 2012    "once; not related to a surgery or bleeding that I remember" (06/06/2013)  . Hypertension     takes Prinizide daily  . Chronic atrial fibrillation     takes Coumadin as instructed  . Peripheral vascular disease   . CHF (congestive heart failure)     takes Lasix every other day  . Emphysema   . History of head injury     many yrs ago  . Bruises easily   . History of bladder infections   . Vaginal discharge     has seen GYN but says its nothing to worry about  . Diabetes mellitus     takes Metformin and Amaryl daily  . Hypothyroidism     takes Synthroid daily  . Macular degeneration     dry  . Bronchial pneumonia     "once; several years ago" (06/06/2013)  . Shortness of breath     "@ rest; use spiriva qd"  (06/06/2013)  . Rheumatoid arthritis     "shoulders; daughter gives me shots of Methotrexate q week" (06/06/2013)    Medications:  Scheduled:  . gabapentin  300 mg Oral BID  . glimepiride  2 mg Oral QAC breakfast  . insulin aspart  0-9 Units Subcutaneous TID WC  . levothyroxine  50 mcg Oral Q breakfast  . protein supplement  1 scoop Oral BID WC  . senna-docusate  1 tablet Oral q morning - 10a  . tiotropium  18 mcg Inhalation Q breakfast  . warfarin  4 mg Oral Q T,Th,S,Su-1800   And  . warfarin  2 mg Oral Q M,W,F-1800  . Warfarin - Pharmacist Dosing Inpatient   Does not apply q1800    Assessment: 79 yo female on chronic Coumadin for afib admitted for abscess ulceration of R forefoot, s/p midfoot amputation yesterday.  Patient had resumed home regimen, but after last night's dose of 4mg , patient's INR has jumped from 2.54 to 3.21.  No bleeding or complications reported.  PTA Coumadin dose: 4 mg daily except 2 mg on MWF.  Last home dose taken 10/23. (was confirmed with patient's daughter). Daughter reports  INR was 2.3 as outpatient on Wed 10/22.  Goal of Therapy:  INR 2-3 Monitor platelets by anticoagulation protocol: Yes   Plan:  - discontinue scheduled home regimen  - hold Coumadin dose tonight and f/u am INR - continue daily INR - monitor for any signs or symptoms of bleeding.  Shelba Flake Achilles Dunk, PharmD Clinical Pharmacist - Resident Pager: [phone] Pharmacy: [phone] 06/08/2013 1:52 PM

## 2013-06-09 ENCOUNTER — Inpatient Hospital Stay (HOSPITAL_COMMUNITY)
Admission: RE | Admit: 2013-06-09 | Discharge: 2013-06-21 | DRG: 945 | Disposition: A | Discharge status: Home Health | Payer: Medicare Other | Source: Intra-hospital | Attending: Physical Medicine & Rehabilitation | Admitting: Physical Medicine & Rehabilitation

## 2013-06-09 ENCOUNTER — Encounter (HOSPITAL_COMMUNITY): Payer: Self-pay | Admitting: Orthopedic Surgery

## 2013-06-09 DIAGNOSIS — I10 Essential (primary) hypertension: Secondary | ICD-10-CM | POA: Diagnosis not present

## 2013-06-09 DIAGNOSIS — G547 Phantom limb syndrome without pain: Secondary | ICD-10-CM | POA: Diagnosis not present

## 2013-06-09 DIAGNOSIS — E1149 Type 2 diabetes mellitus with other diabetic neurological complication: Secondary | ICD-10-CM | POA: Diagnosis not present

## 2013-06-09 DIAGNOSIS — J438 Other emphysema: Secondary | ICD-10-CM

## 2013-06-09 DIAGNOSIS — I739 Peripheral vascular disease, unspecified: Secondary | ICD-10-CM

## 2013-06-09 DIAGNOSIS — E1142 Type 2 diabetes mellitus with diabetic polyneuropathy: Secondary | ICD-10-CM

## 2013-06-09 DIAGNOSIS — Z5189 Encounter for other specified aftercare: Principal | ICD-10-CM

## 2013-06-09 DIAGNOSIS — Z87891 Personal history of nicotine dependence: Secondary | ICD-10-CM

## 2013-06-09 DIAGNOSIS — L98499 Non-pressure chronic ulcer of skin of other sites with unspecified severity: Secondary | ICD-10-CM

## 2013-06-09 DIAGNOSIS — Z96649 Presence of unspecified artificial hip joint: Secondary | ICD-10-CM

## 2013-06-09 DIAGNOSIS — D649 Anemia, unspecified: Secondary | ICD-10-CM | POA: Diagnosis present

## 2013-06-09 DIAGNOSIS — H3530 Unspecified macular degeneration: Secondary | ICD-10-CM

## 2013-06-09 DIAGNOSIS — D62 Acute posthemorrhagic anemia: Secondary | ICD-10-CM

## 2013-06-09 DIAGNOSIS — I359 Nonrheumatic aortic valve disorder, unspecified: Secondary | ICD-10-CM | POA: Diagnosis not present

## 2013-06-09 DIAGNOSIS — N289 Disorder of kidney and ureter, unspecified: Secondary | ICD-10-CM | POA: Diagnosis present

## 2013-06-09 DIAGNOSIS — I4891 Unspecified atrial fibrillation: Secondary | ICD-10-CM | POA: Diagnosis not present

## 2013-06-09 DIAGNOSIS — I5031 Acute diastolic (congestive) heart failure: Secondary | ICD-10-CM

## 2013-06-09 DIAGNOSIS — Z7901 Long term (current) use of anticoagulants: Secondary | ICD-10-CM

## 2013-06-09 DIAGNOSIS — E1140 Type 2 diabetes mellitus with diabetic neuropathy, unspecified: Secondary | ICD-10-CM | POA: Diagnosis present

## 2013-06-09 DIAGNOSIS — Z79899 Other long term (current) drug therapy: Secondary | ICD-10-CM | POA: Diagnosis not present

## 2013-06-09 DIAGNOSIS — I509 Heart failure, unspecified: Secondary | ICD-10-CM | POA: Diagnosis not present

## 2013-06-09 DIAGNOSIS — G546 Phantom limb syndrome with pain: Secondary | ICD-10-CM

## 2013-06-09 DIAGNOSIS — E119 Type 2 diabetes mellitus without complications: Secondary | ICD-10-CM | POA: Diagnosis not present

## 2013-06-09 DIAGNOSIS — S88119A Complete traumatic amputation at level between knee and ankle, unspecified lower leg, initial encounter: Secondary | ICD-10-CM

## 2013-06-09 DIAGNOSIS — M069 Rheumatoid arthritis, unspecified: Secondary | ICD-10-CM

## 2013-06-09 DIAGNOSIS — S98919A Complete traumatic amputation of unspecified foot, level unspecified, initial encounter: Secondary | ICD-10-CM | POA: Diagnosis not present

## 2013-06-09 DIAGNOSIS — E039 Hypothyroidism, unspecified: Secondary | ICD-10-CM

## 2013-06-09 DIAGNOSIS — J90 Pleural effusion, not elsewhere classified: Secondary | ICD-10-CM | POA: Diagnosis not present

## 2013-06-09 DIAGNOSIS — Z89431 Acquired absence of right foot: Secondary | ICD-10-CM

## 2013-06-09 DIAGNOSIS — J9819 Other pulmonary collapse: Secondary | ICD-10-CM | POA: Diagnosis not present

## 2013-06-09 LAB — CBC
HCT: 21.7 % — ABNORMAL LOW (ref 36.0–46.0)
Hemoglobin: 7.4 g/dL — ABNORMAL LOW (ref 12.0–15.0)
MCHC: 34.1 g/dL (ref 30.0–36.0)
Platelets: 153 10*3/uL (ref 150–400)
RBC: 2.3 MIL/uL — ABNORMAL LOW (ref 3.87–5.11)
WBC: 5.9 10*3/uL (ref 4.0–10.5)

## 2013-06-09 LAB — PROTIME-INR
INR: 2.98 — ABNORMAL HIGH (ref 0.00–1.49)
Prothrombin Time: 29.9 seconds — ABNORMAL HIGH (ref 11.6–15.2)

## 2013-06-09 LAB — GLUCOSE, CAPILLARY
Glucose-Capillary: 90 mg/dL (ref 70–99)
Glucose-Capillary: 151 mg/dL — ABNORMAL HIGH (ref 70–99)
Glucose-Capillary: 146 mg/dL — ABNORMAL HIGH (ref 70–99)

## 2013-06-09 LAB — BASIC METABOLIC PANEL
GFR calc Af Amer: 54 mL/min — ABNORMAL LOW (ref >90)
GFR calc non Af Amer: 47 mL/min — ABNORMAL LOW (ref >90)
Potassium: 3.9 mEq/L (ref 3.5–5.1)
Sodium: 138 mEq/L (ref 135–145)

## 2013-06-09 MED ORDER — OXYCODONE-ACETAMINOPHEN 5-325 MG PO TABS: 1.0000 | ORAL_TABLET | ORAL | Status: DC | PRN

## 2013-06-09 MED ORDER — FLEET ENEMA 7-19 GM/118ML RE ENEM: 1.0000 | ENEMA | Freq: Once | RECTAL | Status: AC | PRN

## 2013-06-09 MED ORDER — METHOCARBAMOL 500 MG PO TABS
500.0000 mg | ORAL_TABLET | Freq: Four times a day (QID) | ORAL | Status: DC | PRN
  Administered 2013-06-09 – 2013-06-10 (×2): 500 mg via ORAL
  Filled 2013-06-09: qty 1

## 2013-06-09 MED ORDER — DIPHENHYDRAMINE HCL 12.5 MG/5ML PO ELIX: 12.5000 mg | ORAL_SOLUTION | Freq: Four times a day (QID) | ORAL | Status: DC | PRN

## 2013-06-09 MED ORDER — POLYSACCHARIDE IRON COMPLEX 150 MG PO CAPS
150.0000 mg | ORAL_CAPSULE | Freq: Two times a day (BID) | ORAL | Status: DC
  Administered 2013-06-09 – 2013-06-21 (×23): 150 mg via ORAL
  Filled 2013-06-09 (×26): qty 1

## 2013-06-09 MED ORDER — FOLIC ACID 1 MG PO TABS
1.0000 mg | ORAL_TABLET | Freq: Every day | ORAL | Status: DC
  Administered 2013-06-10 – 2013-06-21 (×12): 1 mg via ORAL
  Filled 2013-06-09 (×13): qty 1

## 2013-06-09 MED ORDER — ALBUTEROL SULFATE (5 MG/ML) 0.5% IN NEBU
2.5000 mg | INHALATION_SOLUTION | Freq: Four times a day (QID) | RESPIRATORY_TRACT | Status: DC | PRN
  Administered 2013-06-18: 2.5 mg via RESPIRATORY_TRACT
  Filled 2013-06-09: qty 0.5

## 2013-06-09 MED ORDER — BENEPROTEIN PO POWD
1.0000 | Freq: Two times a day (BID) | ORAL | Status: DC
  Administered 2013-06-09 – 2013-06-21 (×24): 6 g via ORAL
  Filled 2013-06-09: qty 227

## 2013-06-09 MED ORDER — ALUM & MAG HYDROXIDE-SIMETH 200-200-20 MG/5ML PO SUSP: 30.0000 mL | ORAL | Status: DC | PRN

## 2013-06-09 MED ORDER — ACETAMINOPHEN 325 MG PO TABS
325.0000 mg | ORAL_TABLET | ORAL | Status: DC | PRN
  Administered 2013-06-10 – 2013-06-17 (×7): 650 mg via ORAL
  Administered 2013-06-18: 325 mg via ORAL
  Administered 2013-06-20: 650 mg via ORAL
  Administered 2013-06-20: 325 mg via ORAL
  Administered 2013-06-20: 650 mg via ORAL
  Filled 2013-06-09 (×11): qty 2

## 2013-06-09 MED ORDER — BISACODYL 10 MG RE SUPP
10.0000 mg | Freq: Every day | RECTAL | Status: DC | PRN
  Filled 2013-06-09: qty 1

## 2013-06-09 MED ORDER — POLYETHYLENE GLYCOL 3350 17 G PO PACK
17.0000 g | PACK | Freq: Every day | ORAL | Status: DC
  Administered 2013-06-10 – 2013-06-20 (×7): 17 g via ORAL
  Filled 2013-06-09 (×13): qty 1

## 2013-06-09 MED ORDER — WARFARIN SODIUM 2 MG PO TABS
2.0000 mg | ORAL_TABLET | Freq: Once | ORAL | Status: AC
  Administered 2013-06-09: 2 mg via ORAL
  Filled 2013-06-09: qty 1

## 2013-06-09 MED ORDER — WARFARIN - PHARMACIST DOSING INPATIENT: Freq: Every day | Status: DC

## 2013-06-09 MED ORDER — INSULIN ASPART 100 UNIT/ML ~~LOC~~ SOLN
0.0000 [IU] | Freq: Three times a day (TID) | SUBCUTANEOUS | Status: DC
  Administered 2013-06-09: 2 [IU] via SUBCUTANEOUS
  Administered 2013-06-10: 1 [IU] via SUBCUTANEOUS
  Administered 2013-06-10: 3 [IU] via SUBCUTANEOUS
  Administered 2013-06-11 (×2): 2 [IU] via SUBCUTANEOUS
  Administered 2013-06-12: 1 [IU] via SUBCUTANEOUS
  Administered 2013-06-12: 2 [IU] via SUBCUTANEOUS
  Administered 2013-06-13: 1 [IU] via SUBCUTANEOUS
  Administered 2013-06-14: 2 [IU] via SUBCUTANEOUS
  Administered 2013-06-14 – 2013-06-15 (×2): 1 [IU] via SUBCUTANEOUS
  Administered 2013-06-15: 2 [IU] via SUBCUTANEOUS
  Administered 2013-06-16: 1 [IU] via SUBCUTANEOUS
  Administered 2013-06-16 – 2013-06-17 (×3): 2 [IU] via SUBCUTANEOUS
  Administered 2013-06-17: 1 [IU] via SUBCUTANEOUS
  Administered 2013-06-18 (×3): 2 [IU] via SUBCUTANEOUS
  Administered 2013-06-19: 1 [IU] via SUBCUTANEOUS
  Administered 2013-06-19: 3 [IU] via SUBCUTANEOUS
  Administered 2013-06-20 – 2013-06-21 (×4): 2 [IU] via SUBCUTANEOUS

## 2013-06-09 MED ORDER — SENNOSIDES-DOCUSATE SODIUM 8.6-50 MG PO TABS: 1.0000 | ORAL_TABLET | Freq: Every morning | ORAL | Status: DC

## 2013-06-09 MED ORDER — TIOTROPIUM BROMIDE MONOHYDRATE 18 MCG IN CAPS
18.0000 ug | ORAL_CAPSULE | Freq: Every day | RESPIRATORY_TRACT | Status: DC
  Administered 2013-06-10 – 2013-06-18 (×8): 18 ug via RESPIRATORY_TRACT
  Filled 2013-06-09 (×2): qty 5

## 2013-06-09 MED ORDER — HYDROMORPHONE HCL 2 MG PO TABS
1.0000 mg | ORAL_TABLET | ORAL | Status: DC | PRN
  Administered 2013-06-09: 2 mg via ORAL
  Administered 2013-06-09 (×2): 1 mg via ORAL
  Administered 2013-06-10 – 2013-06-12 (×10): 2 mg via ORAL
  Filled 2013-06-09 (×13): qty 1

## 2013-06-09 MED ORDER — LEVOTHYROXINE SODIUM 50 MCG PO TABS
50.0000 ug | ORAL_TABLET | Freq: Every day | ORAL | Status: DC
  Administered 2013-06-10 – 2013-06-21 (×12): 50 ug via ORAL
  Filled 2013-06-09 (×13): qty 1

## 2013-06-09 MED ORDER — POLYETHYLENE GLYCOL 3350 17 G PO PACK
17.0000 g | PACK | Freq: Every day | ORAL | Status: DC
  Administered 2013-06-09: 17 g via ORAL
  Filled 2013-06-09: qty 1

## 2013-06-09 MED ORDER — ONDANSETRON HCL 4 MG/2ML IJ SOLN: 4.0000 mg | Freq: Four times a day (QID) | INTRAMUSCULAR | Status: DC | PRN

## 2013-06-09 MED ORDER — GABAPENTIN 300 MG PO CAPS
300.0000 mg | ORAL_CAPSULE | Freq: Two times a day (BID) | ORAL | Status: DC
  Administered 2013-06-09 – 2013-06-21 (×24): 300 mg via ORAL
  Filled 2013-06-09 (×26): qty 1

## 2013-06-09 MED ORDER — GLIMEPIRIDE 2 MG PO TABS
2.0000 mg | ORAL_TABLET | Freq: Every day | ORAL | Status: DC
  Administered 2013-06-10 – 2013-06-21 (×12): 2 mg via ORAL
  Filled 2013-06-09 (×13): qty 1

## 2013-06-09 MED ORDER — HYDROMORPHONE HCL 2 MG PO TABS: 1.0000 mg | ORAL_TABLET | ORAL | Status: DC | PRN

## 2013-06-09 MED ORDER — WARFARIN SODIUM 2 MG PO TABS
2.0000 mg | ORAL_TABLET | Freq: Once | ORAL | Status: DC
  Filled 2013-06-09: qty 1

## 2013-06-09 MED ORDER — METHOCARBAMOL 500 MG PO TABS
500.0000 mg | ORAL_TABLET | Freq: Four times a day (QID) | ORAL | Status: DC | PRN
  Administered 2013-06-10 – 2013-06-20 (×4): 500 mg via ORAL
  Filled 2013-06-09 (×6): qty 1

## 2013-06-09 MED ORDER — GUAIFENESIN-DM 100-10 MG/5ML PO SYRP: 5.0000 mL | ORAL_SOLUTION | Freq: Four times a day (QID) | ORAL | Status: DC | PRN

## 2013-06-09 MED ORDER — ONDANSETRON HCL 4 MG PO TABS: 4.0000 mg | ORAL_TABLET | Freq: Four times a day (QID) | ORAL | Status: DC | PRN

## 2013-06-09 MED ORDER — TRAZODONE HCL 50 MG PO TABS: 25.0000 mg | ORAL_TABLET | Freq: Every evening | ORAL | Status: DC | PRN

## 2013-06-09 NOTE — PMR Pre-admission (Signed)
PMR Admission Coordinator Pre-Admission Assessment  Patient: Sydney [name] is an 79 y.o., female MRN: [id number] DOB: [birth year] Height:   Weight:                Insurance Information HMO:     PPO:      PCP:      IPA:      80/20: yes     OTHER: no HMO PRIMARY: medicare a and b      Policy#: [id number]      Subscriber: pt Benefits:  Phone #: vision share     Name: 06/09/13 Eff. Date: 03/14/88     Deduct: $1216      Out of Pocket Max: none      Life Max: none CIR: 100%      SNF: 20 full days LBD11/22/13 Outpatient: 80%     Co-Pay: 20% Home Health: 100%      Co-Pay: none DME: 80%     Co-Pay: 20% Providers: pt choice  SECONDARY: [employer] of [place] supplement      Policy#: [id number]      Subscriber: pt No auth needed with Medicare primary  Emergency Contact Information Contact Information   Name Relation Home Work Mobile   Whitt,Lou Daughter   [phone]   Sydney [name], Sydney [name]    [phone]     Current Medical History  Patient Admitting Diagnosis: right midfoot amputation; 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 female with history of diabetes with peripheral neuropathy, HTN, A fib with chronic coumadin, severe PVD s/p L-BKA (CIR stay Nov 2013) and now with gangrenous changes right foot. Patient admitted on 06/06/13 for right mid foot amputation by Dr. Lajoyce Corners. Post op NWB-RLE. Pain control improving and able to tolerate activity. PT evaluation done yesterday--patient now ready for left prosthesis training as L-BKA wound has finally healed.   Past Medical History  Past Medical History  Diagnosis Date  . Lung disease     BRONCHOSPASTIC  . Neuropathy     takes Gabapentin bid  . Hypercholesterolemia     doesn't require meds for this  . Anemia   . COPD (chronic obstructive pulmonary disease)   . Blood transfusion ~ 2012    "once; not related to a surgery or bleeding that I remember" (06/06/2013)  . Hypertension     takes Prinizide daily  . Chronic atrial fibrillation      takes Coumadin as instructed  . Peripheral vascular disease   . CHF (congestive heart failure)     takes Lasix every other day  . Emphysema   . History of head injury     many yrs ago  . Bruises easily   . History of bladder infections   . Vaginal discharge     has seen GYN but says its nothing to worry about  . Diabetes mellitus     takes Metformin and Amaryl daily  . Hypothyroidism     takes Synthroid daily  . Macular degeneration     dry  . Bronchial pneumonia     "once; several years ago" (06/06/2013)  . Shortness of breath     "@ rest; use spiriva qd" (06/06/2013)  . Rheumatoid arthritis     "shoulders; daughter gives me shots of Methotrexate q week" (06/06/2013)    Family History  family history includes Heart attack in her father; Heart disease in her father and mother; Muscular dystrophy in her brother; Stroke in her father and mother. There  is no history of Anesthesia problems, Hypotension, Malignant hyperthermia, or Pseudochol deficiency.  Prior Rehab/Hospitalizations: CIR 06/2012  Current Medications  Current facility-administered medications:0.9 % NaCl with KCl 20 mEq/ L  infusion, , Intravenous, Continuous, Jarome Matin, MD, Last Rate: 50 mL/hr at 06/08/13 1330, 1,000 mL at 06/08/13 1330;  gabapentin (NEURONTIN) capsule 300 mg, 300 mg, Oral, BID, Nadara Mustard, MD, 300 mg at 06/09/13 1103;  glimepiride (AMARYL) tablet 2 mg, 2 mg, Oral, QAC breakfast, Nadara Mustard, MD, 2 mg at 06/09/13 0810 HYDROmorphone (DILAUDID) injection 0.5-1 mg, 0.5-1 mg, Intravenous, Q2H PRN, Nadara Mustard, MD, 0.5 mg at 06/09/13 0817;  insulin aspart (novoLOG) injection 0-9 Units, 0-9 Units, Subcutaneous, TID WC, Nadara Mustard, MD, 1 Units at 06/09/13 1119;  levothyroxine (SYNTHROID, LEVOTHROID) tablet 50 mcg, 50 mcg, Oral, Q breakfast, Nadara Mustard, MD, 50 mcg at 06/09/13 0810 methocarbamol (ROBAXIN) tablet 500 mg, 500 mg, Oral, Q6H PRN, Nadara Mustard, MD, 500 mg at 06/08/13 2145;   metoCLOPramide (REGLAN) injection 5-10 mg, 5-10 mg, Intravenous, Q8H PRN, Nadara Mustard, MD;  metoCLOPramide (REGLAN) tablet 5-10 mg, 5-10 mg, Oral, Q8H PRN, Nadara Mustard, MD;  ondansetron [facility]) injection 4 mg, 4 mg, Intravenous, Q6H PRN, Nadara Mustard, MD;  ondansetron [facility]) tablet 4 mg, 4 mg, Oral, Q6H PRN, Nadara Mustard, MD polyethylene glycol (MIRALAX / GLYCOLAX) packet 17 g, 17 g, Oral, Daily, Nadara Mustard, MD, 17 g at 06/09/13 1114;  protein supplement (RESOURCE BENEPROTEIN) powder 6 g, 1 scoop, Oral, BID WC, Nadara Mustard, MD, 6 g at 06/09/13 1610;  senna-docusate (Senokot-S) tablet 1 tablet, 1 tablet, Oral, q morning - 10a, Jarome Matin, MD, 1 tablet at 06/09/13 1103 tiotropium [facility]) inhalation capsule 18 mcg, 18 mcg, Inhalation, Q breakfast, Nadara Mustard, MD, 18 mcg at 06/09/13 9604;  warfarin (COUMADIN) tablet 2 mg, 2 mg, Oral, ONCE-1800, Nadara Mustard, MD;  Warfarin - Pharmacist Dosing Inpatient, , Does not apply, q1800, Gardner Candle, RPH, 1 each at 06/06/13 1800  Patients Current Diet: Carb Control  Precautions / Restrictions Precautions Precautions: Fall Restrictions Weight Bearing Restrictions: Yes RLE Weight Bearing: Non weight bearing   Prior Activity Level  w/c level activity in her home with pivot transfers only  Home Assistive Devices / Equipment Home Assistive Devices/Equipment: Wheelchair;Eyeglasses;CBG Meter;Shower chair with back;Hand-held shower hose;Grab bars in shower;Raised toilet seat with rails Home Equipment: Wheelchair - manual;Walker - 2 wheels (prosthesis)  Prior Functional Level Prior Function Level of Independence: Independent with assistive device(s) Comments: Mostly has pivotted for transfers since the BKA  Current Functional Level Cognition  Overall Cognitive Status: Within Functional Limits for tasks assessed Orientation Level: Oriented X4    Extremity Assessment (includes Sensation/Coordination)          ADLs        Mobility  Bed Mobility: Supine to Sit Supine to Sit: 4: Min assist;With rails    Transfers  Transfers: Lateral/Scoot Transfers Lateral/Scoot Transfers: 3: Mod assist;With armrests removed    Ambulation / Gait / Stairs / Engineer, drilling / Balance      Special needs/care consideration Bowel mgmt: no BM since admit. Taking prune juice this am 10/27 Bladder mgmt: continent using bedpan Diabetic mgmt took oral meds at home. Upset about need for SSI at this time    Previous Home Environment Living Arrangements: Alone;Other (Comment) (dtr staying with pt at night only pta when well otherwise th)  Lives With:  Alone Available Help at Discharge: Available PRN/intermittently (Dtr does not feel she can continue indefinetly 24/7) Type of Home: House Home Layout: One level Home Access: Ramped entrance Bathroom Shower/Tub: Walk-in shower;Curtain;Other (comment) (bathroom with tight area to maneuver) Bathroom Toilet: Handicapped height Bathroom Accessibility: Yes How Accessible: Accessible via wheelchair;Accessible via walker Home Care Services: Yes Type of Home Care Services: Housekeeping Additional Comments: was stand pivot only from w/c;had only 2 sessions with op for prosthetic training due to wound issues on LBKA  Discharge Living Setting Plans for Discharge Living Setting: Patient's home;Alone;House Type of Home at Discharge: House Discharge Home Layout: One level Discharge Home Access: Ramped entrance Discharge Bathroom Shower/Tub: Walk-in shower;Curtain Discharge Bathroom Toilet: Handicapped height Discharge Bathroom Accessibility: Yes How Accessible: Accessible via wheelchair;Accessible via walker Does the patient have any problems obtaining your medications?: No  Social/Family/Support Systems Patient Roles: Parent Contact Information: Dene Gentry, daughter Anticipated Caregiver: daughter and may need hired help vs SNF Anticipated Caregiver's Contact  Information: cell [phone] Ability/Limitations of Caregiver: dtr has been caregiver for 3 yrs; mentally and physically  tired.  Caregiver Availability: Evenings only Discharge Plan Discussed with Primary Caregiver: Yes Is Caregiver In Agreement with Plan?: Yes Does Caregiver/Family have Issues with Lodging/Transportation while Pt is in Rehab?: No    Goals/Additional Needs Patient/Family Goal for Rehab: Mod I w/c level PT; Mod I to min OT. Patient used to stand pivot on RLE. little training on prosthesis for LLE. Felt she could be Mod I at w/c level with prosthesis if trained. Otherwise may need SNF which pt upset about and does not want. Dtr trying to help pt problemsolve. Expected length of stay: ELOS 13 to 20 days Special Service Needs: Pt has LLE prosthesis, but has had only 2 OP sessions due to wound issues on LLE. LLE stump without wounds at this time Pt/Family Agrees to Admission and willing to participate: Yes Program Orientation Provided & Reviewed with Pt/Caregiver Including Roles  & Responsibilities: Yes   Decrease burden of Care through IP rehab admission: Decrease number of caregivers   Possible need for SNF placement upon discharge: daughter cannot provide 24/7 assist. Patient will need to be Mod I w/c level as she was pta to return home. She was stand pivot on RLE to bed, and BSC. She would like training on LLE prosthesis and slide board transfers to be able to be at home. Patient adamently scared for SNF placement and daughter does not know how much longer she can provide care at the level she has.  Patient Condition: This patient's condition remains as documented in the consult dated 06/09/13, in which the Rehabilitation Physician determined and documented that the patient's condition is appropriate for intensive rehabilitative care in an inpatient rehabilitation facility. Will admit to inpatient rehab today.  Preadmission Screen Completed By:  Clois Dupes,  06/09/2013 12:02 PM ______________________________________________________________________   Discussed status with Dr. Riley Kill on 06/09/13 at  1210 and received telephone approval for admission today.  Admission Coordinator:  Clois Dupes, time 1210 Date 06/09/13.

## 2013-06-09 NOTE — Progress Notes (Signed)
Patient received from 5N at 1511 alert and oriented x 4 . No complaint of pain in right foot . Right foot dressing with compression dressing positive antecubital pulse in right leg . Left old bka site healed no skin breakdown . Buttocks and sacrum skin intact no breakdown . Oriented patient to room and call bell system . Patient verbalized understanding of admission process. Continue with plan of care.                       [name]

## 2013-06-09 NOTE — Progress Notes (Signed)
I met with patient and her daughter at bedside. Both are in agreement to admission to inpt rehab today. Bed is available and I will arrange. [phone]

## 2013-06-09 NOTE — Discharge Summary (Signed)
Physician Discharge Summary  Patient ID: Sydney [name] MRN: [id number] DOB/AGE: [birth year] 79 y.o.  Admit date: 06/06/2013 Discharge date: 06/09/2013  Admission Diagnoses: Gangrene right forefoot  Discharge Diagnoses: Gangrene right forefoot Principal Problem:   Status post transmetatarsal amputation of right foot Active Problems:   DIABETES MELLITUS, TYPE II   HYPERTENSION   Atrial fibrillation   Acute renal insufficiency   Discharged Condition: stable  Hospital Course: The  patient's hospital course was essentially unremarkable. She underwent a right midfoot amputation. Postoperatively patient progressed well and was discharged to skilled nursing.   Consults: None Significant Diagnostic Studies: labs: Routine labs  Treatments: surgery: See operative note  Discharge Exam: Blood pressure 126/45, pulse 73, temperature 97.9 F (36.6 C), temperature source Oral, resp. rate 18, SpO2 98.00%. Incision/Wound: dressing clean dry and intact  Disposition: 06-Home-Health Care Svc  Discharge Orders   Future Orders Complete By Expires   Call MD / Call 911  As directed    Comments:     If you experience chest pain or shortness of breath, CALL 911 and be transported to the hospital emergency room.  If you develope a fever above 101 F, pus (white drainage) or increased drainage or redness at the wound, or calf pain, call your surgeon's office.   Constipation Prevention  As directed    Comments:     Drink plenty of fluids.  Prune juice may be helpful.  You may use a stool softener, such as Colace (over the counter) 100 mg twice a day.  Use MiraLax (over the counter) for constipation as needed.   Diet - low sodium heart healthy  As directed    Increase activity slowly as tolerated  As directed        Medication List         acetaminophen 325 MG tablet  Commonly known as:  TYLENOL  Take 1-2 tablets (325-650 mg total) by mouth every 4 (four) hours as needed.     calcium carbonate  600 MG Tabs tablet  Commonly known as:  OS-CAL  Take 600 mg by mouth daily.     [employer] CALCIUM SOFT CHEWS PO  Take 2 tablets by mouth daily.     cyanocobalamin 500 MCG tablet  Take 500 mcg by mouth daily.     doxycycline 100 MG capsule  Commonly known as:  VIBRAMYCIN  Take 100 mg by mouth 2 (two) times daily.     ferrous fumarate-iron polysaccharide complex 162-115.2 MG Caps  Commonly known as:  TANDEM  Take 1 capsule by mouth daily with breakfast.     fish oil-omega-3 fatty acids 1000 MG capsule  Take 1 g by mouth daily.     folic acid 1 MG tablet  Commonly known as:  FOLVITE  Take 1 mg by mouth daily.     furosemide 20 MG tablet  Commonly known as:  LASIX  Take 20 mg by mouth daily.     gabapentin 300 MG capsule  Commonly known as:  NEURONTIN  Take 300 mg by mouth 2 (two) times daily.     glimepiride 4 MG tablet  Commonly known as:  AMARYL  Take 2 mg by mouth daily before breakfast.     ICAPS AREDS FORMULA PO  Take 1 capsule by mouth daily with breakfast.     levothyroxine 50 MCG tablet  Commonly known as:  SYNTHROID, LEVOTHROID  Take 50 mcg by mouth daily with breakfast.     lisinopril-hydrochlorothiazide 20-25 MG per tablet  Commonly known as:  PRINZIDE,ZESTORETIC  Take 1 tablet by mouth daily with breakfast.     metFORMIN 500 MG tablet  Commonly known as:  GLUCOPHAGE  Take 500 mg by mouth at bedtime.     methocarbamol 500 MG tablet  Commonly known as:  ROBAXIN  Take 1 tablet (500 mg total) by mouth every 6 (six) hours as needed.     METHOTREXATE SODIUM IJ  Inject 0.4 mLs as directed once a week. On tuesday     multivitamin with minerals Tabs tablet  Take 1 tablet by mouth daily.     nitroGLYCERIN 0.2 mg/hr patch  Commonly known as:  NITRODUR - Dosed in mg/24 hr  Place 1 patch onto the skin at bedtime. On the foot     oxyCODONE-acetaminophen 5-325 MG per tablet  Commonly known as:  ROXICET  Take 1 tablet by mouth every 4 (four) hours as needed for  pain.     protein supplement Powd  Take 1 scoop by mouth 2 (two) times daily with a meal.     tiotropium 18 MCG inhalation capsule  Commonly known as:  SPIRIVA  Place 18 mcg into inhaler and inhale daily with breakfast.     vitamin C 500 MG tablet  Commonly known as:  ASCORBIC ACID  Take 500 mg by mouth daily.     Vitamin D3 400 UNITS Caps  Take 400 Units by mouth daily.     warfarin 4 MG tablet  Commonly known as:  COUMADIN  Take 2-4 mg by mouth See admin instructions. Take 2mg  on Monday ,wednesday, Friday.  Take 4mg  on Tuesday, Thursday, Saturday, sunday           Follow-up Information   Follow up with Savannaha Stonerock V, MD In 2 weeks.   Specialty:  Orthopedic Surgery   Contact information:   [address] [place] [address] [phone]       Signed: Nadara Mustard 06/09/2013, 6:45 AM

## 2013-06-09 NOTE — Progress Notes (Signed)
UR COMPLETED  

## 2013-06-09 NOTE — Progress Notes (Signed)
ANTICOAGULATION CONSULT NOTE   Pharmacy Consult for Coumadin Indication: atrial fibrillation  Allergies  Allergen Reactions  . Other     Narcotics=makes her very disoriented  . Percocet [Oxycodone-Acetaminophen] Other (See Comments)    Dilerium, hallucinations  . Tramadol Anxiety  . Vicodin [Hydrocodone-Acetaminophen] Anxiety    Labs:  Recent Labs  06/06/13 1153 06/07/13 0507 06/08/13 0548 06/09/13 0532  HGB 10.3*  --   --  7.4*  HCT 31.2*  --   --  21.7*  PLT 226  --   --  153  APTT 51*  --   --   --   LABPROT 23.9* 26.5* 31.7* 29.9*  INR 2.22* 2.54* 3.21* 2.98*  CREATININE 1.52*  --  1.08 1.02    The CrCl is unknown because both a height and weight (above a minimum accepted value) are required for this calculation.   Assessment: 79 yo female on chronic Coumadin for afib admitted for abscess ulceration of R forefoot, s/p midfoot amputation 10/24.   Anticoagulation: PTA Coumadin dose: 4 mg daily except 2 mg on MWF. Last PTA dose taken 10/23. (confirmed with patient's daugher). Daughter says INR was 2.3 as outpatient on Wed 10/22. INR trending down to 2.98  Infectious Disease: afebrile, last WBC 10/24 wnl, no abx, recent doxy PTA  Cardiovascular: hypertensive  Endocrinology: BG trending up, on excursion today of 231, on SSI and po glimepiride, on levothyroxine  Gastrointestinal / Nutrition: on feeding supp, senna-docusate  Neurology: on gabapentin, prn robaxin, prn IV dilaudid  Nephrology: SCr Improving to 1.02  Pulmonary: on Spiriva  Hematology / Oncology: H/H: 7.4/21.7  PTA Medication Issues: calcium/d, B-12, iron complex, fish oil, lasix, metformin, mtx injection weekly, MVI, nitro patch, vit c  Best Practices: coumadin  Goal of Therapy:  INR 2-3 Monitor platelets by anticoagulation protocol: Yes   Plan:  1. Coumadin 2 mg po x 1 dose today at 1800 pm 2. Follow up daily INR  Thank you. Okey Regal, PharmD [phone]   06/09/2013 11:10 AM

## 2013-06-09 NOTE — H&P (Signed)
Physical Medicine and Rehabilitation Admission H&P  CC: Right midfoot amputation and L-BKA  HPI: Sydney [name] is a 79 y.o. female with history of diabetes with peripheral neuropathy, HTN, A fib with chronic coumadin, severe PVD s/p L-BKA (CIR stay Nov 2013) and now with gangrenous changes right foot. Patient admitted on 06/06/13 for right mid foot amputation by Dr. Lajoyce Corners. Post op NWB-RLE. Pain control improving and able to tolerate activity. PT evaluation done yesterday--patient now ready for left prosthesis training as L-BKA wound has finally healed  ROS  Past Medical History   Diagnosis  Date   .  Lung disease      BRONCHOSPASTIC   .  Neuropathy      takes Gabapentin bid   .  Hypercholesterolemia      doesn't require meds for this   .  Anemia    .  COPD (chronic obstructive pulmonary disease)    .  Blood transfusion  ~ 2012     "once; not related to a surgery or bleeding that I remember" (06/06/2013)   .  Hypertension      takes Prinizide daily   .  Chronic atrial fibrillation      takes Coumadin as instructed   .  Peripheral vascular disease    .  CHF (congestive heart failure)      takes Lasix every other day   .  Emphysema    .  History of head injury      many yrs ago   .  Bruises easily    .  History of bladder infections    .  Vaginal discharge      has seen GYN but says its nothing to worry about   .  Diabetes mellitus      takes Metformin and Amaryl daily   .  Hypothyroidism      takes Synthroid daily   .  Macular degeneration      dry   .  Bronchial pneumonia      "once; several years ago" (06/06/2013)   .  Shortness of breath      "@ rest; use spiriva qd" (06/06/2013)   .  Rheumatoid arthritis      "shoulders; daughter gives me shots of Methotrexate q week" (06/06/2013)    Past Surgical History   Procedure  Laterality  Date   .  Total hip arthroplasty  Left  ?2013   .  Back surgery     .  Fracture surgery   2011     LEFT HIP FX /REPLACEMENT   .   Cholecystectomy   1951   .  Dilation and curettage of uterus     .  Amputation   11/09/2011     Procedure: AMPUTATION DIGIT; Surgeon: Kathryne Hitch, MD; Location: [facility] OR; Service: Orthopedics; Laterality: Right; Right Great Toe Amputation   .  Amputation   01/05/2012     Procedure: AMPUTATION RAY; Surgeon: Kathryne Hitch, MD; Location: [facility] ORS; Service: Orthopedics; Laterality: Left; Amputation Left 4th and 5th Toes   .  Cardioversion     .  Amputation   05/31/2012     Procedure: AMPUTATION RAY; Surgeon: Nadara Mustard, MD; Location: [facility]; Service: Orthopedics; Laterality: Left; Left foot 1st ray amp   .  Amputation   06/21/2012     Procedure: AMPUTATION BELOW KNEE; Surgeon: Nadara Mustard, MD; Location: [facility] OR; Service: Orthopedics; Laterality: Left; Left Below Knee Amputation   .  Foot amputation through metatarsal  Right  06/06/2013     midfoot/notes 06/06/2013   .  Joint replacement     .  Appendectomy     .  Total abdominal hysterectomy   1975   .  Cervical disc surgery       "the top vertebrae" (06/06/2013)   .  Cataract extraction w/ intraocular lens implant, bilateral  Bilateral    .  Eye surgery       bilateral -cataract surgery    Family History   Problem  Relation  Age of Onset   .  Heart disease  Mother    .  Stroke  Mother    .  Heart disease  Father    .  Stroke  Father    .  Heart attack  Father    .  Muscular dystrophy  Brother    .  Anesthesia problems  Neg Hx    .  Hypotension  Neg Hx    .  Malignant hyperthermia  Neg Hx    .  Pseudochol deficiency  Neg Hx     Social History: Lives alone--daughter spends the night. Independent for SPT. Per reports that she has quit smoking. Her smoking use included Cigarettes. She smoked 0.00 packs per day for 4 years. She has never used smokeless tobacco. She reports that she does not drink alcohol or use illicit drugs  Allergies   Allergen  Reactions   .  Other      Narcotics=makes her very disoriented   .   Percocet [Oxycodone-Acetaminophen]  Other (See Comments)     Dilerium, hallucinations   .  Tramadol  Anxiety   .  Vicodin [Hydrocodone-Acetaminophen]  Anxiety    Medications Prior to Admission   Medication  Sig  Dispense  Refill   .  acetaminophen (TYLENOL) 325 MG tablet  Take 1-2 tablets (325-650 mg total) by mouth every 4 (four) hours as needed.     .  calcium carbonate (OS-CAL) 600 MG TABS  Take 600 mg by mouth daily.     .  Calcium-Vitamin D-Vitamin K ([employer] CALCIUM SOFT CHEWS PO)  Take 2 tablets by mouth daily.     .  Cholecalciferol (VITAMIN D3) 400 UNITS CAPS  Take 400 Units by mouth daily.     .  cyanocobalamin 500 MCG tablet  Take 500 mcg by mouth daily.     Marland Kitchen  doxycycline (VIBRAMYCIN) 100 MG capsule  Take 100 mg by mouth 2 (two) times daily.     .  ferrous fumarate-iron polysaccharide complex (TANDEM) 162-115.2 MG CAPS  Take 1 capsule by mouth daily with breakfast.  60 capsule  1   .  fish oil-omega-3 fatty acids 1000 MG capsule  Take 1 g by mouth daily.     .  folic acid (FOLVITE) 1 MG tablet  Take 1 mg by mouth daily.     .  furosemide (LASIX) 20 MG tablet  Take 20 mg by mouth daily.     Marland Kitchen  gabapentin (NEURONTIN) 300 MG capsule  Take 300 mg by mouth 2 (two) times daily.     Marland Kitchen  glimepiride (AMARYL) 4 MG tablet  Take 2 mg by mouth daily before breakfast.     .  levothyroxine (SYNTHROID, LEVOTHROID) 50 MCG tablet  Take 50 mcg by mouth daily with breakfast.     .  lisinopril-hydrochlorothiazide (PRINZIDE,ZESTORETIC) 20-25 MG per tablet  Take 1 tablet by mouth daily with breakfast.     .  metFORMIN (GLUCOPHAGE) 500 MG tablet  Take 500 mg by mouth at bedtime.     .  METHOTREXATE SODIUM IJ  Inject 0.4 mLs as directed once a week. On tuesday     .  Multiple Vitamin (MULITIVITAMIN WITH MINERALS) TABS  Take 1 tablet by mouth daily.     .  Multiple Vitamins-Minerals (ICAPS AREDS FORMULA PO)  Take 1 capsule by mouth daily with breakfast.     .  nitroGLYCERIN (NITRODUR - DOSED IN MG/24 HR) 0.2  mg/hr patch  Place 1 patch onto the skin at bedtime. On the foot     .  protein supplement (RESOURCE BENEPROTEIN) POWD  Take 1 scoop by mouth 2 (two) times daily with a meal.     .  tiotropium (SPIRIVA) 18 MCG inhalation capsule  Place 18 mcg into inhaler and inhale daily with breakfast.     .  vitamin C (ASCORBIC ACID) 500 MG tablet  Take 500 mg by mouth daily.     Marland Kitchen  warfarin (COUMADIN) 4 MG tablet  Take 2-4 mg by mouth See admin instructions. Take 2mg  on Monday ,wednesday, Friday. Take 4mg  on Tuesday, Thursday, Saturday, sunday     .  methocarbamol (ROBAXIN) 500 MG tablet  Take 1 tablet (500 mg total) by mouth every 6 (six) hours as needed.  45 tablet  1    Home:  Home Living  Family/patient expects to be discharged to:: Private residence  Living Arrangements: Alone;Other (Comment) (dtr staying with pt at night only pta when well otherwise th)  Available Help at Discharge: Available PRN/intermittently (Dtr does not feel she can continue indefinetly 24/7)  Type of Home: House  Home Access: Ramped entrance  Home Layout: One level  Home Equipment: Wheelchair - [employer] - 2 wheels (prosthesis)  Additional Comments: was stand pivot only from w/c;had only 2 sessions with op for prosthetic training due to wound issues on LBKA  Lives With: Alone  Functional History:  Prior Function  Comments: Mostly has pivotted for transfers since the BKA  Functional Status:  Mobility:  Bed Mobility  Bed Mobility: Supine to Sit  Supine to Sit: 4: Min assist;With rails  Transfers  Transfers: Lateral/Scoot Transfers  Lateral/Scoot Transfers: 3: Mod assist;With armrests removed    ADL:   Cognition:  Cognition  Overall Cognitive Status: Within Functional Limits for tasks assessed  Orientation Level: Oriented X4  Cognition  Arousal/Alertness: Awake/alert  Behavior During Therapy: WFL for tasks assessed/performed  Overall Cognitive Status: Within Functional Limits for tasks assessed  Physical  Exam:  Blood pressure 126/45, pulse 73, temperature 97.9 F (36.6 C), temperature source Oral, resp. rate 18, SpO2 98.00%.    Constitutional: She appears well-developed and well-nourished.  HENT:  Head: Normocephalic and atraumatic.  Eyes: Conjunctivae and EOM are normal. Pupils are equal, round, and reactive to light.  Neck: No JVD present. No tracheal deviation present. No thyromegaly present.  Cardiovascular: Normal rate. Normal rhythm, no murmurs Respiratory: Effort normal. Chest clear GI: She exhibits no distension. There is no tenderness. Bowel sounds positive Musculoskeletal:  Left BKA stump well healed. Right foot dressed with coban/immediate post-op dressing, somewhat tender. Remainder of leg is warm and neurovascularly intact.  Lymphadenopathy:  She has no cervical adenopathy.  Neurological: No cranial nerve deficit. Coordination normal.  5/5 strength UE's. RUE is 3-4/5. LLE is4/5. Sensory exam grossly intact in both upper extremities.  Psychiatric: She has a normal mood and affect. Her behavior is normal. Judgment and thought content normal with  good insight and awareness.       Results for orders placed during the hospital encounter of 06/06/13 (from the past 48 hour(s))   GLUCOSE, CAPILLARY Status: Abnormal    Collection Time    06/07/13 4:01 PM   Result  Value  Range    Glucose-Capillary  133 (*)  70 - 99 mg/dL   GLUCOSE, CAPILLARY Status: Abnormal    Collection Time    06/07/13 10:34 PM   Result  Value  Range    Glucose-Capillary  127 (*)  70 - 99 mg/dL   PROTIME-INR Status: Abnormal    Collection Time    06/08/13 5:48 AM   Result  Value  Range    Prothrombin Time  31.7 (*)  11.6 - 15.2 seconds    INR  3.21 (*)  0.00 - 1.49   BASIC METABOLIC PANEL Status: Abnormal    Collection Time    06/08/13 5:48 AM   Result  Value  Range    Sodium  137  135 - 145 mEq/L    Potassium  4.1  3.5 - 5.1 mEq/L    Chloride  101  96 - 112 mEq/L    CO2  25  19 - 32 mEq/L     Glucose, Bld  126 (*)  70 - 99 mg/dL    BUN  35 (*)  6 - 23 mg/dL    Creatinine, Ser  1.61  0.50 - 1.10 mg/dL    Calcium  8.0 (*)  8.4 - 10.5 mg/dL    GFR calc non Af Amer  44 (*)  >90 mL/min    GFR calc Af Amer  51 (*)  >90 mL/min    Comment:  (NOTE)     The eGFR has been calculated using the CKD EPI equation.     This calculation has not been validated in all clinical situations.     eGFR's persistently <90 mL/min signify possible Chronic Kidney     Disease.   GLUCOSE, CAPILLARY Status: Abnormal    Collection Time    06/08/13 7:12 AM   Result  Value  Range    Glucose-Capillary  127 (*)  70 - 99 mg/dL   GLUCOSE, CAPILLARY Status: Abnormal    Collection Time    06/08/13 11:23 AM   Result  Value  Range    Glucose-Capillary  231 (*)  70 - 99 mg/dL   GLUCOSE, CAPILLARY Status: Abnormal    Collection Time    06/08/13 4:37 PM   Result  Value  Range    Glucose-Capillary  168 (*)  70 - 99 mg/dL   GLUCOSE, CAPILLARY Status: Abnormal    Collection Time    06/08/13 10:02 PM   Result  Value  Range    Glucose-Capillary  186 (*)  70 - 99 mg/dL   PROTIME-INR Status: Abnormal    Collection Time    06/09/13 5:32 AM   Result  Value  Range    Prothrombin Time  29.9 (*)  11.6 - 15.2 seconds    INR  2.98 (*)  0.00 - 1.49   BASIC METABOLIC PANEL Status: Abnormal    Collection Time    06/09/13 5:32 AM   Result  Value  Range    Sodium  138  135 - 145 mEq/L    Potassium  3.9  3.5 - 5.1 mEq/L    Chloride  103  96 - 112 mEq/L    CO2  25  19 - 32  mEq/L    Glucose, Bld  91  70 - 99 mg/dL    BUN  30 (*)  6 - 23 mg/dL    Creatinine, Ser  1.61  0.50 - 1.10 mg/dL    Calcium  7.8 (*)  8.4 - 10.5 mg/dL    GFR calc non Af Amer  47 (*)  >90 mL/min    GFR calc Af Amer  54 (*)  >90 mL/min    Comment:  (NOTE)     The eGFR has been calculated using the CKD EPI equation.     This calculation has not been validated in all clinical situations.     eGFR's persistently <90 mL/min signify possible Chronic  Kidney     Disease.   CBC Status: Abnormal    Collection Time    06/09/13 5:32 AM   Result  Value  Range    WBC  5.9  4.0 - 10.5 K/uL    RBC  2.30 (*)  3.87 - 5.11 MIL/uL    Hemoglobin  7.4 (*)  12.0 - 15.0 g/dL    HCT  09.6 (*)  04.5 - 46.0 %    MCV  94.3  78.0 - 100.0 fL    MCH  32.2  26.0 - 34.0 pg    MCHC  34.1  30.0 - 36.0 g/dL    RDW  40.9  81.1 - 91.4 %    Platelets  153  150 - 400 K/uL   GLUCOSE, CAPILLARY Status: None    Collection Time    06/09/13 6:49 AM   Result  Value  Range    Glucose-Capillary  90  70 - 99 mg/dL   GLUCOSE, CAPILLARY Status: Abnormal    Collection Time    06/09/13 11:13 AM   Result  Value  Range    Glucose-Capillary  141 (*)  70 - 99 mg/dL    Comment 1  Notify RN     No results found.  Post Admission Physician Evaluation:  1. Functional deficits secondary to right mid foot amputation, previous left bka. 2. Patient is admitted to receive collaborative, interdisciplinary care between the physiatrist, rehab nursing staff, and therapy team. 3. Patient's level of medical complexity and substantial therapy needs in context of that medical necessity cannot be provided at a lesser intensity of care such as a SNF. 4. Patient has experienced substantial functional loss from his/her baseline which was documented above under the "Functional History" and "Functional Status" headings. Judging by the patient's diagnosis, physical exam, and functional history, the patient has potential for functional progress which will result in measurable gains while on inpatient rehab. These gains will be of substantial and practical use upon discharge in facilitating mobility and self-care at the household level. 5. Physiatrist will provide 24 hour management of medical needs as well as oversight of the therapy plan/treatment and provide guidance as appropriate regarding the interaction of the two. 6. 24 hour rehab nursing will assist with bladder management, bowel management,  safety, skin/wound care, disease management, medication administration, pain management and patient education and help integrate therapy concepts, techniques,education, etc. 7. PT will assess and treat for/with: Lower extremity strength, range of motion, stamina, balance, functional mobility, safety, adaptive techniques and equipment, pre-pros education, pain mgt, wb precautions. Goals are: mod I to supervision with transfers and w/c mobility. 8. OT will assess and treat for/with: ADL's, functional mobility, safety, upper extremity strength, adaptive techniques and equipment, pain mgt, ortho precautions. Goals are: mod I to  min assist. 9. SLP will assess and treat for/with: n/a. Goals are: n/a. 10. Case Management and Social Worker will assess and treat for psychological issues and discharge planning. 11. Team conference will be held weekly to assess progress toward goals and to determine barriers to discharge. 12. Patient will receive at least 3 hours of therapy per day at least 5 days per week. 13. ELOS: 13-20 days  14. Prognosis: excellent   Medical Problem List and Plan:  1. H/o A fib/ DVT Prophylaxis/Anticoagulation: Pharmaceutical: Coumadin  2. Neuropathy with phantom pain: continue neurontin. Pain Management:  3. Mood: pt in good spirits. Team to provide egosupport as required.  4. Neuropsych: This patient is capable of making decisions on her own behalf.  5. HTN: Will monitor with bid checks. Continue amaryl daily and use SSI for elevated BS. Metformin discontinued due to elevated Cr >=1.4 at Admission--improved off diuretics and due to hydration with ongoing IVF. Discontinue fluids to avoid overload.  6. Chronic anemia with superimposed ABLA: recheck in am--if symptomatic or lower will transfuse. Add iron supplement  7. DM type 2: Will monitor with ac/hs checks. Continue  8. H/o fluid overload: will check weights every day. Will need to stay on dry side. Lasix on hold currently.  9.  COPD: Continue spiriva. Encourage IS as well as staying OOB during the day,. 10. Low protein stores: continue protein supplements.   Ranelle Oyster, MD, [facility] Physical Medicine & Rehabilitation   06/09/2013

## 2013-06-09 NOTE — Progress Notes (Signed)
Report called to RN at [facility]. Pt transferred via bed to 4W21.

## 2013-06-09 NOTE — Progress Notes (Signed)
Physical Therapy Treatment Patient Details Name: TATIONNA FULLARD MRN: [id number] DOB: [birth year] Today's Date: 06/09/2013 Time: 4098-1191 PT Time Calculation (min): 20 min  PT Assessment / Plan / Recommendation  History of Present Illness s/p R transmet amputation; Of note, pt has L BKA, pretty recent, has prosthesis, but has not begun prosthesis training yet due to complications with healing, and now this admission   PT Comments   Pt quite fatigued and emotional today; is looking forward to going to CIR  Follow Up Recommendations  CIR     Does the patient have the potential to tolerate intense rehabilitation     Barriers to Discharge        Equipment Recommendations  None recommended by PT    Recommendations for Other Services    Frequency Min 5X/week   Progress towards PT Goals Progress towards PT goals: Progressing toward goals  Plan Current plan remains appropriate    Precautions / Restrictions Precautions Precautions: Fall Restrictions RLE Weight Bearing: Non weight bearing   Pertinent Vitals/Pain 5/10 R foot elevated for edema and pain control     Mobility  Bed Mobility Bed Mobility: Rolling Right;Rolling Left Rolling Right: 3: Mod assist;With rail Rolling Left: 3: Mod assist;With rail Details for Bed Mobility Assistance: Pt was on bedpan upon arrival she agreed to rolling/bed mobility; Cues to roll fully onto sidelying and to use rail Transfers Details for Transfer Assistance: Pt politely declined transfers OOB    Exercises     PT Diagnosis:    PT Problem List:   PT Treatment Interventions:     PT Goals (current goals can now be found in the care plan section) Acute Rehab PT Goals Patient Stated Goal: For R foot to heal and be able to use her prosthesis to pivot transfer on Left while R foot heals  Visit Information  Last PT Received On: 06/09/13 Assistance Needed: +1 History of Present Illness: s/p R transmet amputation; Of note, pt has L BKA, pretty  recent, has prosthesis, but has not begun prosthesis training yet due to complications with healing, and now this admission    Subjective Data  Subjective: Reports is very tired this afternoon; Looking forward to going to rehab; Became tearful at the thought of being a burden to her daughter Patient Stated Goal: For R foot to heal and be able to use her prosthesis to pivot transfer on Left while R foot heals   Cognition  Cognition Arousal/Alertness: Awake/alert Behavior During Therapy: WFL for tasks assessed/performed Overall Cognitive Status: Within Functional Limits for tasks assessed    Balance     End of Session PT - End of Session Activity Tolerance: Patient limited by fatigue Patient left: in bed;with call bell/phone within reach Nurse Communication: Mobility status   GP     [facility] Clines [facility] [place], [place] [phone]  06/09/2013, 2:24 PM

## 2013-06-10 ENCOUNTER — Inpatient Hospital Stay (HOSPITAL_COMMUNITY): Payer: Medicare Other | Admitting: Physical Therapy

## 2013-06-10 ENCOUNTER — Encounter (HOSPITAL_COMMUNITY): Payer: Self-pay | Admitting: Neurology

## 2013-06-10 ENCOUNTER — Inpatient Hospital Stay (HOSPITAL_COMMUNITY): Payer: Medicare Other

## 2013-06-10 ENCOUNTER — Inpatient Hospital Stay (HOSPITAL_COMMUNITY): Payer: Medicare Other | Admitting: Occupational Therapy

## 2013-06-10 DIAGNOSIS — L98499 Non-pressure chronic ulcer of skin of other sites with unspecified severity: Secondary | ICD-10-CM

## 2013-06-10 DIAGNOSIS — D62 Acute posthemorrhagic anemia: Secondary | ICD-10-CM

## 2013-06-10 DIAGNOSIS — I739 Peripheral vascular disease, unspecified: Secondary | ICD-10-CM

## 2013-06-10 DIAGNOSIS — S98919A Complete traumatic amputation of unspecified foot, level unspecified, initial encounter: Secondary | ICD-10-CM

## 2013-06-10 DIAGNOSIS — S88119A Complete traumatic amputation at level between knee and ankle, unspecified lower leg, initial encounter: Secondary | ICD-10-CM

## 2013-06-10 LAB — CBC WITH DIFFERENTIAL/PLATELET
Basophils Relative: 0 % (ref 0–1)
Eosinophils Absolute: 0.1 10*3/uL (ref 0.0–0.7)
HCT: 22.7 % — ABNORMAL LOW (ref 36.0–46.0)
Hemoglobin: 7.7 g/dL — ABNORMAL LOW (ref 12.0–15.0)
Lymphs Abs: 1.2 10*3/uL (ref 0.7–4.0)
MCH: 32 pg (ref 26.0–34.0)
MCHC: 33.9 g/dL (ref 30.0–36.0)
MCV: 94.2 fL (ref 78.0–100.0)
Monocytes Absolute: 0.7 10*3/uL (ref 0.1–1.0)
Monocytes Relative: 9 % (ref 3–12)
Platelets: 169 10*3/uL (ref 150–400)
RBC: 2.41 MIL/uL — ABNORMAL LOW (ref 3.87–5.11)

## 2013-06-10 LAB — COMPREHENSIVE METABOLIC PANEL
Albumin: 2.5 g/dL — ABNORMAL LOW (ref 3.5–5.2)
Alkaline Phosphatase: 46 U/L (ref 39–117)
BUN: 25 mg/dL — ABNORMAL HIGH (ref 6–23)
CO2: 23 mEq/L (ref 19–32)
Creatinine, Ser: 0.85 mg/dL (ref 0.50–1.10)
GFR calc Af Amer: 68 mL/min — ABNORMAL LOW (ref >90)
Glucose, Bld: 110 mg/dL — ABNORMAL HIGH (ref 70–99)
Potassium: 4.4 mEq/L (ref 3.5–5.1)
Sodium: 134 mEq/L — ABNORMAL LOW (ref 135–145)
Total Bilirubin: 0.5 mg/dL (ref 0.3–1.2)
Total Protein: 5.9 g/dL — ABNORMAL LOW (ref 6.0–8.3)

## 2013-06-10 LAB — GLUCOSE, CAPILLARY
Glucose-Capillary: 114 mg/dL — ABNORMAL HIGH (ref 70–99)
Glucose-Capillary: 204 mg/dL — ABNORMAL HIGH (ref 70–99)

## 2013-06-10 MED ORDER — NORTRIPTYLINE HCL 10 MG PO CAPS
10.0000 mg | ORAL_CAPSULE | Freq: Every day | ORAL | Status: DC
  Administered 2013-06-10 – 2013-06-20 (×11): 10 mg via ORAL
  Filled 2013-06-10 (×12): qty 1

## 2013-06-10 MED ORDER — SENNOSIDES-DOCUSATE SODIUM 8.6-50 MG PO TABS
1.0000 | ORAL_TABLET | Freq: Every morning | ORAL | Status: DC
  Administered 2013-06-10 – 2013-06-20 (×7): 1 via ORAL
  Filled 2013-06-10 (×9): qty 1

## 2013-06-10 MED ORDER — WARFARIN SODIUM 4 MG PO TABS
4.0000 mg | ORAL_TABLET | Freq: Once | ORAL | Status: AC
  Administered 2013-06-10: 4 mg via ORAL
  Filled 2013-06-10: qty 1

## 2013-06-10 MED ORDER — SORBITOL 70 % SOLN
30.0000 mL | Freq: Once | Status: AC
  Administered 2013-06-10: 30 mL via ORAL
  Filled 2013-06-10: qty 30

## 2013-06-10 NOTE — Progress Notes (Addendum)
Social Work Assessment and Plan  Patient Details  Name: ARLYNN STARE MRN: [id number] Date of Birth: [birth year]  Today's Date: 06/10/2013  Problem List:  Patient Active Problem List   Diagnosis Date Noted  . Acute on chronic anemia 06/09/2013  . Acute renal insufficiency 06/07/2013    Class: Acute  . Status post transmetatarsal amputation of right foot 06/07/2013    Class: Acute  . Chest pain 04/29/2013  . Phantom limb pain 07/24/2012  . Diabetic neuropathy 07/24/2012  . Unilateral complete BKA-left 06/24/2012  . Osteomyelitis of toe 01/05/2012  . Osteomyelitis of toe of left foot 01/05/2012  . Chronic osteomyelitis of toe 11/09/2011  . HYPOTHYROIDISM 06/11/2007  . DIABETES MELLITUS, TYPE II 06/11/2007  . HYPERLIPIDEMIA 06/11/2007  . HYPERTENSION 06/11/2007  . Atrial fibrillation 06/11/2007  . COPD 06/11/2007   Past Medical History:  Past Medical History  Diagnosis Date  . Lung disease     BRONCHOSPASTIC  . Neuropathy     takes Gabapentin bid  . Hypercholesterolemia     doesn't require meds for this  . Anemia   . COPD (chronic obstructive pulmonary disease)   . Blood transfusion ~ 2012    "once; not related to a surgery or bleeding that I remember" (06/06/2013)  . Hypertension     takes Prinizide daily  . Chronic atrial fibrillation     takes Coumadin as instructed  . Peripheral vascular disease   . CHF (congestive heart failure)     takes Lasix every other day  . Emphysema   . History of head injury     many yrs ago  . Bruises easily   . History of bladder infections   . Vaginal discharge     has seen GYN but says its nothing to worry about  . Diabetes mellitus     takes Metformin and Amaryl daily  . Hypothyroidism     takes Synthroid daily  . Macular degeneration     dry  . Bronchial pneumonia     "once; several years ago" (06/06/2013)  . Shortness of breath     "@ rest; use spiriva qd" (06/06/2013)  . Rheumatoid arthritis     "shoulders; daughter  gives me shots of Methotrexate q week" (06/06/2013)   Past Surgical History:  Past Surgical History  Procedure Laterality Date  . Total hip arthroplasty Left ?2013  . Back surgery    . Fracture surgery  2011    LEFT HIP FX /REPLACEMENT   . Cholecystectomy  1951  . Dilation and curettage of uterus    . Amputation  11/09/2011    Procedure: AMPUTATION DIGIT;  Surgeon: Kathryne Hitch, MD;  Location: [facility] OR;  Service: Orthopedics;  Laterality: Right;  Right Great Toe Amputation  . Amputation  01/05/2012    Procedure: AMPUTATION RAY;  Surgeon: Kathryne Hitch, MD;  Location: [facility] ORS;  Service: Orthopedics;  Laterality: Left;  Amputation Left 4th and 5th Toes  . Cardioversion    . Amputation  05/31/2012    Procedure: AMPUTATION RAY;  Surgeon: Nadara Mustard, MD;  Location: [facility] OR;  Service: Orthopedics;  Laterality: Left;  Left foot 1st ray amp  . Amputation  06/21/2012    Procedure: AMPUTATION BELOW KNEE;  Surgeon: Nadara Mustard, MD;  Location: [facility] OR;  Service: Orthopedics;  Laterality: Left;  Left Below Knee Amputation  . Foot amputation through metatarsal Right 06/06/2013    midfoot/notes 06/06/2013  . Joint replacement    . Appendectomy    .  Total abdominal hysterectomy  1975  . Cervical disc surgery      "the top vertebrae" (06/06/2013)  . Cataract extraction w/ intraocular lens  implant, bilateral Bilateral   . Eye surgery      bilateral -cataract surgery  . Amputation Right 06/06/2013    Procedure: RIGHT MIDFOOT AMPUTATION;  Surgeon: Nadara Mustard, MD;  Location: [facility] OR;  Service: Orthopedics;  Laterality: Right;   Social History:  reports that she has quit smoking. Her smoking use included Cigarettes. She smoked 0.00 packs per day for 4 years. She has never used smokeless tobacco. She reports that she does not drink alcohol or use illicit drugs.  Family / Support Systems Marital Status: Widow/Widower How Long?: 32 years Patient Roles: Parent;Volunteer;Other (Comment)  (active church member [facility] [employer] [employer]) Children: Raynelle Dick - daughter  [phone];  Clydine Parkison - son  [phone] Other Supports: friends/church members visit Anticipated Caregiver: daughter/son-in-law vs. paid caregivers vs. SNF Ability/Limitations of Caregiver: dtr has been caregiver for 3 yrs; mentally and physically tired. Dtr is not sure how much longer she can provide care. Caregiver Availability: Intermittent Family Dynamics: Close family, but son is in [place] and not in good health.  Dtr is devoted, but already extended.  Pt understands her level of care may be more than her dtr can provide.  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 Recent Psychosocial Issues: Pt's dtr and son-in-law have been staying at the pt's home nightly for the last year.  They have been caring for the pt for 3 years total.  Dtr may not be able to continue this commitment and pt understands this, yet she is terrified about the thought of SNF after a previous bad experience while at a SNF 3 years ago.   Pyschiatric History: none Substance Abuse History: none  Patient / Family Perceptions, Expectations & Goals Pt/Family understanding of illness & functional limitations: Pt/family have a good and realistic understanding of pt's condition and limitations. Premorbid  pt/family roles/activities: Pt enjoys reading, listening to music, and stuff pillow with her church for people who are hospitalized. Anticipated changes in roles/activities/participation: Pt plans to resume the above activities. Pt/family expectations/goals: "I want to walk!"  [employer]: Other (Comment) (has had [employer] in the past) Premorbid Home Care/DME Agencies: Other (Comment) (has a w/c, transfer board, shower chair with drop arm, and drop arm commode.  Also has [employer].) Transportation available at discharge: dtr Resource referrals recommended: Neuropsychology;Support group (specify)  Discharge Planning Living Arrangements: Alone (dtr and son-in-law spend the night) Support Systems: Children;Friends/neighbors;Church/faith community;Home care staff;Other (Comment) (housekeeper one day/week) Type of Residence: Private residence [employer] Resources: [employer] (specify) ([employer] secondary) Financial Resources: Tree surgeon;Other (Comment) Astronomer stipend) Financial Screen Referred: No Living Expenses: Motgage Money Management: Family (dtr) Does the patient have any problems obtaining your medications?: No Home Management: Dtr manages pt's finances.  Pt has a housekeeper that comes once/week to clean.  Patient/Family Preliminary Plans: Home with family vs. SNF  Barriers to Discharge: Family Support Social Work Anticipated Follow Up Needs: HH/OP;SNF;Support Group Expected length of stay: ELOS 13 to 20 days  Clinical Impression CSW met with pt and pt's dtr to complete assessment and explain the role of CSW.  Pt was admitted to [facility] last year when she had her L BKA and so she is familiar with how things work on Rehab unit.  Pt's dtr has been staying with pt nightly since pt's last admission and she is getting very tired and overwhelmed.  Dtr's husband has been very understanding and helpful, as well.  Pt is motivated and  determined to work hard and walk again, stating that she "will not be like this forever."  She was positive and upbeat, but did become tearful when talking about the option of needing to go to a SNF.  Pt has been before after her hip replacement 3 years ago and had a terrible experience.  Pt also mentioned that her mother was at [facility] and she had a good experience, so she recognizes that each facility is different.  Pt is realistic that her dtr and son-in-law have helped her a lot and she does not want to take advantage of them.  Pt has agreed to f/u with pt/dtr to discuss d/c plans further, letting pt know that a decision does not need to be made today.  Dtr is very devoted to her mother, but was also very tearful with CSW during assessment stating that it has become too much to keep up this level of care for her mother.  CSW encouraged dtr to apply for Medicaid, as this may assist with in home services or nursing facility care, if pt is eligible.  She is familiar with how to do that as she just went through the process with her mother-in-law about a 1 1/2 ago.  CSW will continue to follow pt and assist her with d/c plan and offer support to pt's dtr.  Quanisha Drewry, [place] 06/10/2013, 1:42 PM

## 2013-06-10 NOTE — Progress Notes (Signed)
PHARMACY FOLLOW UP CONSULT NOTE   Pharmacy Consult :  Coumadin Indication: atrial fibrillation  Dosing Weight: 76 kg  Labs:  Recent Labs  06/08/13 0548 06/09/13 0532 06/10/13 0440  HGB  --  7.4* 7.7*  HCT  --  21.7* 22.7*  PLT  --  153 169   Lab Results  Component Value Date   INR 2.98* 06/09/2013   INR 3.21* 06/08/2013   INR 2.54* 06/07/2013   Estimated Creatinine Clearance: 44.9 ml/min (by C-G formula based on Cr of 0.85).  Medications:  Prescriptions prior to admission  Medication Sig Dispense Refill  . acetaminophen (TYLENOL) 325 MG tablet Take 1-2 tablets (325-650 mg total) by mouth every 4 (four) hours as needed.      . calcium carbonate (OS-CAL) 600 MG TABS Take 600 mg by mouth daily.      . Calcium-Vitamin D-Vitamin K ([employer] CALCIUM SOFT CHEWS PO) Take 2 tablets by mouth daily.      . Cholecalciferol (VITAMIN D3) 400 UNITS CAPS Take 400 Units by mouth daily.       . cyanocobalamin 500 MCG tablet Take 500 mcg by mouth daily.      Marland Kitchen doxycycline (VIBRAMYCIN) 100 MG capsule Take 100 mg by mouth 2 (two) times daily.       . ferrous fumarate-iron polysaccharide complex (TANDEM) 162-115.2 MG CAPS Take 1 capsule by mouth daily with breakfast.  60 capsule  1  . fish oil-omega-3 fatty acids 1000 MG capsule Take 1 g by mouth daily.      . folic acid (FOLVITE) 1 MG tablet Take 1 mg by mouth daily.      . furosemide (LASIX) 20 MG tablet Take 20 mg by mouth daily.      Marland Kitchen gabapentin (NEURONTIN) 300 MG capsule Take 300 mg by mouth 2 (two) times daily.      Marland Kitchen glimepiride (AMARYL) 4 MG tablet Take 2 mg by mouth daily before breakfast.       . levothyroxine (SYNTHROID, LEVOTHROID) 50 MCG tablet Take 50 mcg by mouth daily with breakfast.       . lisinopril-hydrochlorothiazide (PRINZIDE,ZESTORETIC) 20-25 MG per tablet Take 1 tablet by mouth daily with breakfast.       . metFORMIN (GLUCOPHAGE) 500 MG tablet Take 500 mg by mouth at bedtime.       . methocarbamol (ROBAXIN) 500 MG tablet  Take 1 tablet (500 mg total) by mouth every 6 (six) hours as needed.  45 tablet  1  . METHOTREXATE SODIUM IJ Inject 0.4 mLs as directed once a week. On tuesday      . Multiple Vitamin (MULITIVITAMIN WITH MINERALS) TABS Take 1 tablet by mouth daily.      . Multiple Vitamins-Minerals (ICAPS AREDS FORMULA PO) Take 1 capsule by mouth daily with breakfast.      . nitroGLYCERIN (NITRODUR - DOSED IN MG/24 HR) 0.2 mg/hr patch Place 1 patch onto the skin at bedtime. On the foot      . oxyCODONE-acetaminophen (ROXICET) 5-325 MG per tablet Take 1 tablet by mouth every 4 (four) hours as needed for pain.  60 tablet  0  . protein supplement (RESOURCE BENEPROTEIN) POWD Take 1 scoop by mouth 2 (two) times daily with a meal.      . tiotropium (SPIRIVA) 18 MCG inhalation capsule Place 18 mcg into inhaler and inhale daily with breakfast.       . vitamin C (ASCORBIC ACID) 500 MG tablet Take 500 mg by mouth daily.      Marland Kitchen  warfarin (COUMADIN) 4 MG tablet Take 2-4 mg by mouth See admin instructions. Take 2mg  on Monday ,wednesday, Friday.  Take 4mg  on Tuesday, Thursday, Saturday, sunday       Scheduled:  . folic acid  1 mg Oral Daily  . gabapentin  300 mg Oral BID  . glimepiride  2 mg Oral QAC breakfast  . insulin aspart  0-9 Units Subcutaneous TID WC  . iron polysaccharides  150 mg Oral BID AC  . levothyroxine  50 mcg Oral QAC breakfast  . nortriptyline  10 mg Oral QHS  . polyethylene glycol  17 g Oral Daily  . protein supplement  1 scoop Oral BID WC  . senna-docusate  1 tablet Oral q morning - 10a  . tiotropium  18 mcg Inhalation Q breakfast  . warfarin  4 mg Oral ONCE-1800  . Warfarin - Pharmacist Dosing Inpatient   Does not apply q1800    Assessment:  79 y/o female continuing on Chronic Coumadin for history of atrial fibrillation.  She was admitted for an abscessed ulceration of the R-foot and received a midfoot amputation on 10/24.  Last INR within the therapeutic range.  No bleeding complications noted    Goal of Therapy:  INR 2-3   Plan:   Coumadin 4 mg today. Daily INR's, Monitor for bleeding complications.   Kenlie Seki, Elisha Headland, Pharm.D.  06/10/2013,1:12 PM

## 2013-06-10 NOTE — Evaluation (Signed)
Physical Therapy Assessment and Plan  Patient Details  Name: Sydney [name] MRN: [id number] Date of Birth: 12/11/1920  PT Diagnosis: Abnormal posture, Impaired sensation, Muscle weakness and Pain in joint Rehab Potential: Good ELOS: 10-14 days   Today's Date: 06/10/2013 Time: 1000-1100 and 13:30-1400 Time Calculation (min): 60 min and 30 min  Problem List:  Patient Active Problem List   Diagnosis Date Noted  . Acute on chronic anemia 06/09/2013  . Acute renal insufficiency 06/07/2013    Class: Acute  . Status post transmetatarsal amputation of right foot 06/07/2013    Class: Acute  . Chest pain 04/29/2013  . Phantom limb pain 07/24/2012  . Diabetic neuropathy 07/24/2012  . Unilateral complete BKA-left 06/24/2012  . Osteomyelitis of toe 01/05/2012  . Osteomyelitis of toe of left foot 01/05/2012  . Chronic osteomyelitis of toe 11/09/2011  . HYPOTHYROIDISM 06/11/2007  . DIABETES MELLITUS, TYPE II 06/11/2007  . HYPERLIPIDEMIA 06/11/2007  . HYPERTENSION 06/11/2007  . Atrial fibrillation 06/11/2007  . COPD 06/11/2007    Past Medical History:  Past Medical History  Diagnosis Date  . Lung disease     BRONCHOSPASTIC  . Neuropathy     takes Gabapentin bid  . Hypercholesterolemia     doesn't require meds for this  . Anemia   . COPD (chronic obstructive pulmonary disease)   . Blood transfusion ~ 2012    "once; not related to a surgery or bleeding that I remember" (06/06/2013)  . Hypertension     takes Prinizide daily  . Chronic atrial fibrillation     takes Coumadin as instructed  . Peripheral vascular disease   . CHF (congestive heart failure)     takes Lasix every other day  . Emphysema   . History of head injury     many yrs ago  . Bruises easily   . History of bladder infections   . Vaginal discharge     has seen GYN but says its nothing to worry about  . Diabetes mellitus     takes Metformin and Amaryl daily  . Hypothyroidism     takes Synthroid daily  .  Macular degeneration     dry  . Bronchial pneumonia     "once; several years ago" (06/06/2013)  . Shortness of breath     "@ rest; use spiriva qd" (06/06/2013)  . Rheumatoid arthritis     "shoulders; daughter gives me shots of Methotrexate q week" (06/06/2013)   Past Surgical History:  Past Surgical History  Procedure Laterality Date  . Total hip arthroplasty Left ?2013  . Back surgery    . Fracture surgery  2011    LEFT HIP FX /REPLACEMENT   . Cholecystectomy  1951  . Dilation and curettage of uterus    . Amputation  11/09/2011    Procedure: AMPUTATION DIGIT;  Surgeon: Kathryne Hitch, MD;  Location: [facility] OR;  Service: Orthopedics;  Laterality: Right;  Right Great Toe Amputation  . Amputation  01/05/2012    Procedure: AMPUTATION RAY;  Surgeon: Kathryne Hitch, MD;  Location: [facility] ORS;  Service: Orthopedics;  Laterality: Left;  Amputation Left 4th and 5th Toes  . Cardioversion    . Amputation  05/31/2012    Procedure: AMPUTATION RAY;  Surgeon: Nadara Mustard, MD;  Location: [facility] OR;  Service: Orthopedics;  Laterality: Left;  Left foot 1st ray amp  . Amputation  06/21/2012    Procedure: AMPUTATION BELOW KNEE;  Surgeon: Nadara Mustard, MD;  Location: [facility] OR;  Service: Orthopedics;  Laterality: Left;  Left Below Knee Amputation  . Foot amputation through metatarsal Right 06/06/2013    midfoot/notes 06/06/2013  . Joint replacement    . Appendectomy    . Total abdominal hysterectomy  1975  . Cervical disc surgery      "the top vertebrae" (06/06/2013)  . Cataract extraction w/ intraocular lens  implant, bilateral Bilateral   . Eye surgery      bilateral -cataract surgery  . Amputation Right 06/06/2013    Procedure: RIGHT MIDFOOT AMPUTATION;  Surgeon: Nadara Mustard, MD;  Location: [facility] OR;  Service: Orthopedics;  Laterality: Right;    Assessment & Plan Clinical Impression: Patient is a 79 y.o. year old female with history of diabetes with peripheral neuropathy, HTN, A fib with  chronic coumadin, severe PVD s/p L-BKA (CIR stay Nov 2013) and now with gangrenous changes right foot. Patient admitted on 06/06/13 for right mid foot amputation by Dr. Lajoyce Corners. Post op NWB-RLE.  with recent admission to the hospital on 06/06/13 with R transmetatarsal amputation secondary to gangrene.  Patient transferred to CIR on 06/09/2013 .   Patient currently requires mod - +2 total assistance with mobility secondary to muscle weakness, decreased cardiorespiratoy endurance, impaired timing and sequencing and unbalanced muscle activation, decreased initiation, decreased problem solving, decreased memory and delayed processing and decreased sitting balance, decreased standing balance, decreased postural control, decreased balance strategies and difficulty maintaining precautions.  Prior to hospitalization, patient was modified independent  with mobility and lived with Alone;Other (Comment) (daughter stays with patient at night) in a House home.  Home access is  Ramped entrance.  Patient will benefit from skilled PT intervention to maximize safe functional mobility, minimize fall risk and decrease caregiver burden for planned discharge home with 24/7 assistance available.  Anticipate patient will benefit from follow up OP at discharge.  PT - End of Session Activity Tolerance: Tolerates 10 - 20 min activity with multiple rests Endurance Deficit: Yes Endurance Deficit Description: Pt exhibits fatigue, exhibits SOB following w/c propulsion and lateral transfers x3. SpO2 98%; HR 91 bmp. PT Assessment Rehab Potential: Good Barriers to Discharge: Inaccessible home environment;Decreased caregiver support;Other (comment) Barriers to Discharge Comments: Pt/daughter report small bathroom space; daughter able to provide supervision/assistance in evenings only. PT Patient demonstrates impairments in the following area(s): Balance;Endurance PT Transfers Functional Problem(s): Bed Mobility;Bed to Chair;Car PT  Locomotion Functional Problem(s): Ambulation;Other (comment);Wheelchair Mobility (Pt currently unable to ambulate due to limited LLE prosthetic training; w/c mobility limited by endurance) PT Plan PT Intensity: Minimum of 1-2 x/day ,45 to 90 minutes PT Frequency: 5 out of 7 days PT Duration Estimated Length of Stay: 10-14 PT Treatment/Interventions: Discharge planning;Functional mobility training;Patient/family education;Pain management;Neuromuscular re-education;Therapeutic Exercise;Therapeutic Activities;UE/LE Strength taining/ROM;Wheelchair propulsion/positioning PT Transfers Anticipated Outcome(s): supervision from wheelchair level PT Locomotion Anticipated Outcome(s): Supervision at w/c level. PT Recommendation Follow Up Recommendations: Outpatient PT Patient destination: Home Equipment Recommended: None recommended by PT  Skilled Therapeutic Intervention Treatment Session 1: Patient performed multiple lateral transfers from bed<>w/c via scooting on slide board requiring increasingly more assistance secondary to increased pt fatigue. Initial transfer from bed>w/c with mod A and verbal cues for anterior weight shifting. Second transfer from w/c>mat table (neutral height) with max A. Final 2 transfers from mat>w/c followed by w/c>bed with +2 totalA to maintain NWB on RLE and for anterior weight shifting to facilitate effective scooting technique. Pt performed self-propulsion of w/c using bilat UE's x150' with supervision. Following session, pt transported in w/c to pt room where  session ended. Therapists departed with pt semi-reclined in bed with call bell within reach, all needs met, and daughter present.  Treatment Session 2: Session began in pt room, where pt was found in semi-reclined position in bed. Patient performed supine>sit with supervision and verbal cues to bring arm across midline. Verbal cues provided to remind pt of correct setup, sequencing of lateral transfer.While seated EOB  pt  performed scooting in all directions using single bed rail and requiring with concurrent verbal cues and PT demonstration to facilitate weight shifting to side contralateral to that being moved. Pt gave effective return demonstration. Pt then performed lateral transfer from (raised) bed>w/c using slide board requiring mod A and verbal cues to promote anterior weight shift. During transfer, pt became anxious/distracted that she was accidentally bearing weight through RLE. Pt then performed w/c>bed transfer with +2 total A (patient performed effective scooting for ~50% of transfer prior to becoming fatigued) with second person ensuring pt maintained NMW at RLE. Sit>supine with min A for RLE management. Therapist departed with pt semi-reclined in bed with CNA present, bed alarm on, and call bell within reach.  PT Evaluation Precautions/Restrictions Precautions Precautions: Fall Restrictions Weight Bearing Restrictions: Yes RLE Weight Bearing: Non weight bearing General Chart Reviewed: Yes Family/Caregiver Present: Yes (daughter, [name])   Pain Pain Assessment Pain Assessment: No/denies pain Pain Score: 0-No pain No pain at rest but pt reports intermittent (10/10) pain in R ankle at worst; 5/10 pain in R ankle with plantarflexion/dorsiflexion AROM. Home Living/Prior Functioning Home Living Living Arrangements: Alone;Other (Comment) Available Help at Discharge: Family;Available PRN/intermittently;Other (Comment) (daughter) Type of Home: House Home Access: Ramped entrance Home Layout: One level  Lives With: Alone;Other (Comment) (daughter stays with patient at night) Prior Function Level of Independence: Independent with transfers;Requires assistive device for independence;Needs assistance with gait;Other (comment) (initiated LLE prosthetic training in OP PT; 2 sessios completed PTA)  Able to Take Stairs?: No Driving: No Cognition Overall Cognitive Status: Within Functional Limits for tasks  assessed Memory: Impaired Memory Impairment: Retrieval deficit Problem Solving: Impaired Problem Solving Impairment: Functional basic;Functional complex Safety/Judgment: Appears intact Sensation Sensation Light Touch: Impaired by gross assessment (R foot numbness) Coordination Gross Motor Movements are Fluid and Coordinated: Yes Motor  Motor Motor: Within Functional Limits;Other (comment) (generalized weakness)  Mobility Bed Mobility Bed Mobility: Supine to Sit;Sit to Supine Supine to Sit: 5: Supervision;With rails;Other (comment) (verbal cues for UE reaching) Supine to Sit Details: Tactile cues for initiation Sit to Supine: 4: Min assist Sit to Supine - Details: Tactile cues for initiation;Verbal cues for technique Scooting to HOB: 1: +2 Total assist Scooting to [facility]: Patient Percentage: 10% Transfers Transfers: Yes Lateral/Scoot Transfers: 1: +2 Total assist;With slide board;With armrests removed;3: Mod assist Lateral/Scoot Transfer Details (indicate cue type and reason): Lateral scoot bed>w/c with mod A; w/c>bed with +2 total A secondary to pt. fatigue, to maintain RLE NWB Locomotion  Wheelchair Mobility Wheelchair Mobility: Yes Wheelchair Assistance: 5: Financial planner Details: Tactile cues for initiation Wheelchair Propulsion: Both upper extremities Wheelchair Parts Management: Needs assistance (verbal cueing and assist required for removing w/c arm rests) Distance: 150  Trunk/Postural Assessment  Postural Control Postural Control: Within Functional Limits (decreased postural control during lateral transfers secondary to no LE support )  Balance Balance Balance Assessed: Yes Static Sitting Balance Static Sitting - Balance Support: No upper extremity supported;Feet unsupported Static Sitting - Level of Assistance: 7: Independent Dynamic Sitting Balance Dynamic Sitting - Balance Support: No upper extremity supported Dynamic Sitting - Level  of  Assistance: 4: Min assist Reach (Patient is able to reach ___ inches to right, left, forward, back): requires SBA when reaching to setup for transfer but requires min-modA  and verbal cueing to facilitate anterior weight shift during lateral transfer. Dynamic Sitting - Balance Activities: Lateral lean/weight shifting;Forward lean/weight shifting Dynamic Sitting - Comments: Pt apprehensive to perform anterior weight shift during lateral transfers due to no LE support. Performs lateral weight shifts with (S) and single UE support. Extremity Assessment   RLE Assessment RLE Assessment: Exceptions to [facility] RLE Strength RLE Overall Strength Comments: Overall, strength is grossly ~4-/5 in bilat hips, ~4/5 in bilat knees. R ankle strength not formally assessed due to post-surgical pain.  LLE Assessment LLE Assessment: Exceptions to [facility] LLE Strength LLE Overall Strength: Deficits LLE Overall Strength Comments: Overall, strength is grossly ~4-/5 in bilat hips, ~4/5 in bilat knees, with exception of L ankle due to L BKA. FIM:  FIM - Banker Devices: Bed rails;Arm rests;Sliding board Bed/Chair Transfer: 1: Two helpers;5: Supine > Sit: Supervision (verbal cues/safety issues);4: Sit > Supine: Min A (steadying pt. > 75%/lift 1 leg);1: Chair or W/C > Bed: Total A (helper does all/Pt. < 25%);3: Bed > Chair or W/C: Mod A (lift or lower assist) FIM - Locomotion: Wheelchair Distance: 150 Locomotion: Wheelchair: 5: Travels 150 ft or more: maneuvers on rugs and over door sills with supervision, cueing or coaxing FIM - Locomotion: Ambulation Locomotion: Ambulation: 0: Activity did not occur (As pt is NWB on RLE and has not had adequate LLE prosthetic training)   Refer to Care Plan for Long Term Goals  Recommendations for other services: None  Discharge Criteria: Patient will be discharged from PT if patient refuses treatment 3 consecutive times without medical reason, if  treatment goals not met, if there is a change in medical status, if patient makes no progress towards goals or if patient is discharged from hospital.  The above assessment, treatment plan, treatment alternatives and goals were discussed and mutually agreed upon: by patient and by family  Calvert Cantor 06/10/2013, 12:36 PM

## 2013-06-10 NOTE — Evaluation (Signed)
Occupational Therapy Assessment and Plan & Session Note  Patient Details  Name: Sydney [name] MRN: [id number] Date of Birth: 06-16-1921  OT Diagnosis: acute pain and muscle weakness (generalized) Rehab Potential: Rehab Potential: Good ELOS: 10-14 days   Today's Date: 06/10/2013  Problem List:  Patient Active Problem List   Diagnosis Date Noted  . Acute on chronic anemia 06/09/2013  . Acute renal insufficiency 06/07/2013    Class: Acute  . Status post transmetatarsal amputation of right foot 06/07/2013    Class: Acute  . Chest pain 04/29/2013  . Phantom limb pain 07/24/2012  . Diabetic neuropathy 07/24/2012  . Unilateral complete BKA-left 06/24/2012  . Osteomyelitis of toe 01/05/2012  . Osteomyelitis of toe of left foot 01/05/2012  . Chronic osteomyelitis of toe 11/09/2011  . HYPOTHYROIDISM 06/11/2007  . DIABETES MELLITUS, TYPE II 06/11/2007  . HYPERLIPIDEMIA 06/11/2007  . HYPERTENSION 06/11/2007  . Atrial fibrillation 06/11/2007  . COPD 06/11/2007    Past Medical History:  Past Medical History  Diagnosis Date  . Lung disease     BRONCHOSPASTIC  . Neuropathy     takes Gabapentin bid  . Hypercholesterolemia     doesn't require meds for this  . Anemia   . COPD (chronic obstructive pulmonary disease)   . Blood transfusion ~ 2012    "once; not related to a surgery or bleeding that I remember" (06/06/2013)  . Hypertension     takes Prinizide daily  . Chronic atrial fibrillation     takes Coumadin as instructed  . Peripheral vascular disease   . CHF (congestive heart failure)     takes Lasix every other day  . Emphysema   . History of head injury     many yrs ago  . Bruises easily   . History of bladder infections   . Vaginal discharge     has seen GYN but says its nothing to worry about  . Diabetes mellitus     takes Metformin and Amaryl daily  . Hypothyroidism     takes Synthroid daily  . Macular degeneration     dry  . Bronchial pneumonia     "once;  several years ago" (06/06/2013)  . Shortness of breath     "@ rest; use spiriva qd" (06/06/2013)  . Rheumatoid arthritis     "shoulders; daughter gives me shots of Methotrexate q week" (06/06/2013)   Past Surgical History:  Past Surgical History  Procedure Laterality Date  . Total hip arthroplasty Left ?2013  . Back surgery    . Fracture surgery  2011    LEFT HIP FX /REPLACEMENT   . Cholecystectomy  1951  . Dilation and curettage of uterus    . Amputation  11/09/2011    Procedure: AMPUTATION DIGIT;  Surgeon: Kathryne Hitch, MD;  Location: [facility] OR;  Service: Orthopedics;  Laterality: Right;  Right Great Toe Amputation  . Amputation  01/05/2012    Procedure: AMPUTATION RAY;  Surgeon: Kathryne Hitch, MD;  Location: [facility] ORS;  Service: Orthopedics;  Laterality: Left;  Amputation Left 4th and 5th Toes  . Cardioversion    . Amputation  05/31/2012    Procedure: AMPUTATION RAY;  Surgeon: Nadara Mustard, MD;  Location: [facility] OR;  Service: Orthopedics;  Laterality: Left;  Left foot 1st ray amp  . Amputation  06/21/2012    Procedure: AMPUTATION BELOW KNEE;  Surgeon: Nadara Mustard, MD;  Location: [facility] OR;  Service: Orthopedics;  Laterality: Left;  Left Below Knee Amputation  .  Foot amputation through metatarsal Right 06/06/2013    midfoot/notes 06/06/2013  . Joint replacement    . Appendectomy    . Total abdominal hysterectomy  1975  . Cervical disc surgery      "the top vertebrae" (06/06/2013)  . Cataract extraction w/ intraocular lens  implant, bilateral Bilateral   . Eye surgery      bilateral -cataract surgery  . Amputation Right 06/06/2013    Procedure: RIGHT MIDFOOT AMPUTATION;  Surgeon: Nadara Mustard, MD;  Location: [facility] OR;  Service: Orthopedics;  Laterality: Right;    Clinical Impression: Sydney [name] is a 79 y.o. female with history of diabetes with peripheral neuropathy, HTN, A fib with chronic coumadin, severe PVD s/p L-BKA (CIR stay Nov 2013) and now with gangrenous changes  right foot. Patient admitted on 06/06/13 for right mid foot amputation by Dr. Lajoyce Corners. Post op NWB-RLE. Pain control improving and able to tolerate activity. PT evaluation done yesterday--patient now ready for left prosthesis training as L-BKA wound has finally healed. Patient transferred to CIR on 06/09/2013 .    Patient currently requires min-mod assist with basic self-care skills secondary to muscle weakness and muscle joint tightness, decreased cardiorespiratoy endurance and decreased sitting balance, decreased postural control, decreased balance strategies and difficulty maintaining precautions.  Prior to hospitalization, patient could complete BADLs with supervision/set-up assist from her daughter.   Patient will benefit from skilled intervention to increase independence with basic self-care skills prior to discharge home with care partner.  Anticipate patient will require 24 hour supervision and follow up home health.  OT - End of Session Activity Tolerance: Tolerates 10 - 20 min activity with multiple rests Endurance Deficit: Yes Endurance Deficit Description: patient overall deconditioned post surgery OT Assessment Rehab Potential: Good Barriers to Discharge: Decreased caregiver support OT Patient demonstrates impairments in the following area(s): Endurance;Balance;Motor;Pain;Safety;Skin Integrity OT Basic ADL's Functional Problem(s): Grooming;Bathing;Dressing;Toileting OT Transfers Functional Problem(s): Toilet;Tub/Shower OT Additional Impairment(s): Other (comment) (strengthening to BUEs) OT Plan OT Intensity: Minimum of 1-2 x/day, 45 to 90 minutes OT Frequency: 5 out of 7 days OT Duration/Estimated Length of Stay: 10-14 days OT Treatment/Interventions: Balance/vestibular training;Community reintegration;Discharge planning;DME/adaptive equipment instruction;Functional mobility training;Pain management;Patient/family education;Psychosocial support;Self Care/advanced ADL retraining;Skin  care/wound managment;Splinting/orthotics;Therapeutic Activities;Therapeutic Exercise;UE/LE Strength taining/ROM;UE/LE Coordination activities;Wheelchair propulsion/positioning OT Self Feeding Anticipated Outcome(s): Independent (at this level) OT Basic Self-Care Anticipated Outcome(s): supervision OT Toileting Anticipated Outcome(s): supervision OT Bathroom Transfers Anticipated Outcome(s): supervision OT Recommendation Patient destination: Home Follow Up Recommendations: Home health OT Equipment Recommended: None recommended by OT  Precautions/Restrictions  Precautions Precautions: Fall Restrictions Weight Bearing Restrictions: Yes RLE Weight Bearing: Non weight bearing  General Chart Reviewed: Yes Family/Caregiver Present: No  Vital Signs Therapy Vitals Temp: 98.9 F (37.2 C) Temp src: Oral Pulse Rate: 85 Resp: 18 BP: 118/48 mmHg Patient Position, if appropriate: Lying Oxygen Therapy SpO2: 97 %  Pain Pain Assessment Pain Assessment: 0-10 Pain Score: 8  Pain Type: Acute pain Pain Location: Foot Pain Orientation: Right Pain Descriptors / Indicators: Aching Pain Onset: Sudden  Home Living/Prior Functioning Home Living Family/patient expects to be discharged to:: Private residence Living Arrangements: Alone;Other (Comment) Available Help at Discharge: Available PRN/intermittently Type of Home: House Home Access: Ramped entrance Home Layout: One level Additional Comments: was stand pivot only from w/c;had only 2 sessions with op for prosthetic training due to wound issues on LBKA  Lives With: Alone IADL History Homemaking Responsibilities: No Occupation: Retired Type of Occupation: [employer] Leisure and Hobbies: Puzzles IADL Comments: Patient has lady  come clean and cook for her prn Prior Function Level of Independence: Needs assistance with ADLs;Requires assistive device for independence (patient mainly needed set-up/supervision assist prn )  Able  to Take Stairs?: No Driving: No  ADL - See FIM  Vision/Perception  Vision - History Baseline Vision: Wears glasses only for reading Patient Visual Report: No change from baseline Vision - Assessment Eye Alignment: Within Functional Limits Perception Perception: Within Functional Limits Praxis Praxis: Intact   Cognition Overall Cognitive Status: Within Functional Limits for tasks assessed Orientation Level: Oriented X4 Memory: Appears intact Awareness: Appears intact Problem Solving: Appears intact Safety/Judgment: Appears intact Comments: Patient did seem a little slow to process during evaluation with decreased memory as well  Sensation Sensation Additional Comments: Patient with some complaints of numbness and tingling in bilateral hands/fingers Coordination Gross Motor Movements are Fluid and Coordinated: Yes Fine Motor Movements are Fluid and Coordinated: Yes  Motor  Motor Motor: Within Functional Limits;Other (comment)  Mobility  Bed Mobility Bed Mobility: Supine to Sit;Sit to Supine Rolling Right: 3: Mod assist;With rail Rolling Left: 3: Mod assist;With rail Supine to Sit: 5: Supervision;With rails;Other (comment) Supine to Sit Details: Tactile cues for initiation Sit to Supine: 4: Min assist Sit to Supine - Details: Tactile cues for initiation;Verbal cues for technique Scooting to HOB: 1: +2 Total assist Scooting to [facility]: Patient Percentage: 10%   Balance Balance Balance Assessed: Yes Static Sitting Balance Static Sitting - Balance Support: No upper extremity supported;Feet unsupported Static Sitting - Level of Assistance: 7: Independent Dynamic Sitting Balance Dynamic Sitting - Balance Support: No upper extremity supported Dynamic Sitting - Level of Assistance: 4: Min assist Reach (Patient is able to reach ___ inches to right, left, forward, back): requires SBA when reaching to setup for transfer but requires min-modA  and verbal cueing to facilitate  anterior weight shift during lateral transfer. Dynamic Sitting - Balance Activities: Lateral lean/weight shifting;Forward lean/weight shifting  Extremity/Trunk Assessment RUE Assessment RUE Assessment: Within Functional Limits (Can benefit from functional strengthening exercises ) LUE Assessment LUE Assessment: Within Functional Limits (Can benefit from functional strengthening exercises )  FIM:  FIM - Eating Eating Activity: 7: Complete independence:no helper FIM - Grooming Grooming Steps: Wash, rinse, dry face;Wash, rinse, dry hands;Oral care, brush teeth, clean dentures;Brush, comb hair Grooming: 5: Set-up assist to obtain items FIM - Bathing Bathing Steps Patient Completed: Chest;Right Arm;Left Arm;Abdomen;Front perineal area;Right upper leg;Left upper leg Bathing: 4: Min-Patient completes 8-9 44f 10 parts or 75+ percent FIM - Upper Body Dressing/Undressing Upper body dressing/undressing steps patient completed: Thread/unthread right sleeve of pullover shirt/dresss;Thread/unthread left sleeve of pullover shirt/dress;Put head through opening of pull over shirt/dress;Pull shirt over trunk Upper body dressing/undressing: 5: Set-up assist to: Obtain clothing/put away FIM - Lower Body Dressing/Undressing Lower body dressing/undressing steps patient completed: Thread/unthread right pants leg;Thread/unthread left pants leg Lower body dressing/undressing: 3: Mod-Patient completed 50-74% of tasks FIM - Toileting Toileting: 0: Activity did not occur FIM - Banker Devices: Sliding board;Bed rails Bed/Chair Transfer: 5: Supine > Sit: Supervision (verbal cues/safety issues);4: Bed > Chair or W/C: Min A (steadying Pt. > 75%) FIM - Toilet Transfers Toilet Transfers: 0-Activity did not occur FIM - Tub/Shower Transfers Tub/shower Transfers: 0-Activity did not occur or was simulated   Refer to Care Plan for Long Term Goals  Recommendations for other  services: None at this time.  Discharge Criteria: Patient will be discharged from OT if patient refuses treatment 3 consecutive times without medical reason, if treatment  goals not met, if there is a change in medical status, if patient makes no progress towards goals or if patient is discharged from hospital.  The above assessment, treatment plan, treatment alternatives and goals were discussed and mutually agreed upon: by patient  -------------------------------------------------------------------------------------------------------------------------  SESSION NOTE [phone] - 60 Minutes Individual Therapy Patient with 8/10 "sudden pain" in right foot Initial 1:1 occupational therapy evaluation completed. Focused skilled intervention on bed mobility, UB/LB bathing & dressing, edge of bed -> w/c slide board transfer, grooming tasks seated at sink in w/c, and overall activity tolerance/endurance. Patient with slight processing delays noted during session and a decrease in her memory. Patient has difficult time adhering to NWB status -> RLE. At end of session, left patient seated in w/c beside bed with call bell & phone within reach.   Sheletha Bow 06/11/2013, 7:08 AM

## 2013-06-10 NOTE — Progress Notes (Signed)
Occupational Therapy Session Note  Patient Details  Name: Sydney [name] MRN: [id number] Date of Birth: 08-30-1920  Today's Date: 06/10/2013 Time: 1330-1430 Time Calculation (min): 60 min  Short Term Goals: Week 1:  OT Short Term Goal 1 (Week 1): Patient will perform LB dressing with minimal assistance from w/c level OT Short Term Goal 2 (Week 1): Patient will perform toilet transfer with minimal assistance using DME prn OT Short Term Goal 3 (Week 1): Patient will be independent with UB dressing OT Short Term Goal 4 (Week 1): Patient will be educated on a HEP  Skilled Therapeutic Interventions/Progress Updates: ADL-retraining with emphasis on slide board transfer, toileting, weight-shifting, and bed mobility.   Patient requested assist with toileting using BSC as she does at home stating that she is unable to use bedpan effectively.   Patient required min assist to roll to right and mod assist to sit at edge of bed.   From edge of bed patient completed sliding board transfer to DA [facility] with mod assist to shift weight and manage sliding board.   Patient voided BM and completed toilet hygiene although with incomplete thoroughness.  Patient required max assist to return to bed using sliding board and total assist to don diaper and clothing this session while performing bed mobility, rolling side to side, with min assist to complete motion.    Therapy Documentation Precautions:  Precautions Precautions: Fall Restrictions Weight Bearing Restrictions: Yes RLE Weight Bearing: Non weight bearing  Vital Signs: Oxygen Therapy SpO2: 97 % O2 Device: None (Room air)  Pain: Pain Assessment Pain Assessment: No/denies pain Pain Score: 0-No pain  See FIM for current functional status  Therapy/Group: Individual Therapy  [name] 06/10/2013, 2:31 PM

## 2013-06-10 NOTE — Plan of Care (Signed)
Problem: RH SKIN INTEGRITY Goal: RH STG MAINTAIN SKIN INTEGRITY WITH ASSISTANCE STG Maintain Skin Integrity With mod Assistance.  Outcome: Not Progressing Requires total assist to manage skin

## 2013-06-10 NOTE — Progress Notes (Signed)
[facility] Individual Statement of Services  Patient Name:  Sydney [name]  Date:  06/10/2013  Welcome to the [facility].  Our goal is to provide you with an individualized program based on your diagnosis and situation, designed to meet your specific needs.  With this comprehensive rehabilitation program, you will be expected to participate in at least 3 hours of rehabilitation therapies Monday-Friday, with modified therapy programming on the weekends.  Your rehabilitation program will include the following services:  Physical Therapy (PT), Occupational Therapy (OT), 24 hour per day rehabilitation nursing, Neuropsychology, Case Management (Social Worker), Rehabilitation Medicine, Nutrition Services and Pharmacy Services  Weekly team conferences will be held on Tuesdays to discuss your progress.  Your Social Worker will talk with you frequently to get your input and to update you on team discussions.  Team conferences with you and your family in attendance may also be held.  Expected length of stay: 10 - 14 days Overall anticipated outcome:  Supervision  Depending on your progress and recovery, your program may change. Your Social Worker will coordinate services and will keep you informed of any changes. Your Social Worker's name and contact numbers are listed  below.  The following services may also be recommended but are not provided by the [facility]:   Driving Evaluations  Home Health Rehabiltiation Services  Outpatient Rehabilitation Services   Arrangements will be made to provide these services after discharge if needed.  Arrangements include referral to agencies that provide these services.  Your insurance has been verified to be:  Medicare and BCBS Your primary doctor is:  Dr. Geoffry Paradise  Pertinent information will be shared with your doctor and your insurance company.  Social Worker:  Staci Acosta, LCSW  [phone] or ([phone]  Information discussed with and copy given to patient by: Elvera Lennox, 06/10/2013, 2:18 PM

## 2013-06-10 NOTE — Progress Notes (Signed)
Subjective/Complaints: Complains of a lot of phantom pain (even in left leg) Eating breakfast, has appetite A 12 point review of systems has been performed and if not noted above is otherwise negative.   Objective: Vital Signs: Blood pressure 118/48, pulse 85, temperature 98.9 F (37.2 C), temperature source Oral, resp. rate 18, height 5\' 5"  (1.651 m), weight 76.3 kg (168 lb 3.4 oz), SpO2 97.00%. No results found.  Recent Labs  06/09/13 0532 06/10/13 0440  WBC 5.9 7.4  HGB 7.4* 7.7*  HCT 21.7* 22.7*  PLT 153 169    Recent Labs  06/09/13 0532 06/10/13 0440  NA 138 134*  K 3.9 4.4  CL 103 99  GLUCOSE 91 110*  BUN 30* 25*  CREATININE 1.02 0.85  CALCIUM 7.8* 8.0*   CBG (last 3)   Recent Labs  06/09/13 1625 06/09/13 2034 06/10/13 0719  GLUCAP 151* 146* 114*    Wt Readings from Last 3 Encounters:  06/10/13 76.3 kg (168 lb 3.4 oz)  06/09/13 75 kg (165 lb 5.5 oz)  06/09/13 75 kg (165 lb 5.5 oz)    Physical Exam:  Constitutional: She appears well-developed and well-nourished.  HENT:  Head: Normocephalic and atraumatic.  Eyes: Conjunctivae and EOM are normal. Pupils are equal, round, and reactive to light.  Neck: No JVD present. No tracheal deviation present. No thyromegaly present.  Cardiovascular: Normal rate. Normal rhythm, no murmurs  Respiratory: Effort normal. Chest clear  GI: She exhibits no distension. There is no tenderness. Bowel sounds positive Musculoskeletal:  Left BKA stump well healed. Right foot dressed with coban/immediate post-op dressing, somewhat tender. Remainder of leg is warm and neurovascularly intact.  Lymphadenopathy:  She has no cervical adenopathy.  Neurological: No cranial nerve deficit. Coordination normal.  5/5 strength UE's. RUE is 3-4/5. LLE is4/5. Sensory exam grossly intact in both upper extremities.  Psychiatric: She has a normal mood and affect. Her behavior is normal. Judgment and thought content normal with good insight and  awareness   Assessment/Plan: 1. Functional deficits secondary to right midfoot amputation (prior left BKA)  which require 3+ hours per day of interdisciplinary therapy in a comprehensive inpatient rehab setting. Physiatrist is providing close team supervision and 24 hour management of active medical problems listed below. Physiatrist and rehab team continue to assess barriers to discharge/monitor patient progress toward functional and medical goals. FIM:                   Comprehension Comprehension Mode: Auditory Comprehension: 5-Understands basic 90% of the time/requires cueing < 10% of the time  Expression Expression Mode: Verbal Expression: 5-Expresses basic needs/ideas: With no assist  Social Interaction Social Interaction: 5-Interacts appropriately 90% of the time - Needs monitoring or encouragement for participation or interaction.  Problem Solving Problem Solving: 5-Solves basic 90% of the time/requires cueing < 10% of the time  Memory Memory: 4-Recognizes or recalls 75 - 89% of the time/requires cueing 10 - 24% of the time  Medical Problem List and Plan:  1. H/o A fib/ DVT Prophylaxis/Anticoagulation: Pharmaceutical: Coumadin  2. Neuropathy with phantom pain: continue neurontin---hesitant to titrate higher given age, etc. Will try low dose pamelor at bedtime to see if she tolerates  3. Mood: pt in good spirits. Team to provide egosupport as required.  4. Neuropsych: This patient is capable of making decisions on her own behalf.  5. HTN: Will monitor with bid checks. Continue amaryl daily and use SSI for elevated BS. Metformin discontinued due to elevated Cr >=1.4  at Admission--improved off diuretics and due to hydration   -follow i's and o's 6. Chronic anemia with superimposed ABLA: recheck in am--if symptomatic or lower will transfuse. Added iron supplement  7. DM type 2: sugars under control  8. H/o fluid overload: will check weights every day. Will need to  stay on dry side. Lasix on hold currently.  9. COPD: Continue spiriva. Encourage IS as well as staying OOB during the day,.  10. Low protein stores: continue protein supplements.   LOS (Days) 1 A FACE TO FACE EVALUATION WAS PERFORMED  [name],[name] 06/10/2013 8:39 AM

## 2013-06-11 ENCOUNTER — Inpatient Hospital Stay (HOSPITAL_COMMUNITY): Payer: Medicare Other | Admitting: Physical Therapy

## 2013-06-11 ENCOUNTER — Inpatient Hospital Stay (HOSPITAL_COMMUNITY): Payer: Medicare Other | Admitting: Occupational Therapy

## 2013-06-11 ENCOUNTER — Inpatient Hospital Stay (HOSPITAL_COMMUNITY): Payer: Medicare Other

## 2013-06-11 DIAGNOSIS — L98499 Non-pressure chronic ulcer of skin of other sites with unspecified severity: Secondary | ICD-10-CM

## 2013-06-11 DIAGNOSIS — D62 Acute posthemorrhagic anemia: Secondary | ICD-10-CM

## 2013-06-11 DIAGNOSIS — I739 Peripheral vascular disease, unspecified: Secondary | ICD-10-CM

## 2013-06-11 DIAGNOSIS — S98919A Complete traumatic amputation of unspecified foot, level unspecified, initial encounter: Secondary | ICD-10-CM

## 2013-06-11 DIAGNOSIS — S88119A Complete traumatic amputation at level between knee and ankle, unspecified lower leg, initial encounter: Secondary | ICD-10-CM

## 2013-06-11 LAB — PROTIME-INR
INR: 2.02 — ABNORMAL HIGH (ref 0.00–1.49)
Prothrombin Time: 22.2 seconds — ABNORMAL HIGH (ref 11.6–15.2)

## 2013-06-11 LAB — GLUCOSE, CAPILLARY
Glucose-Capillary: 141 mg/dL — ABNORMAL HIGH (ref 70–99)
Glucose-Capillary: 161 mg/dL — ABNORMAL HIGH (ref 70–99)
Glucose-Capillary: 71 mg/dL (ref 70–99)

## 2013-06-11 NOTE — Patient Care Conference (Signed)
Inpatient RehabilitationTeam Conference and Plan of Care Update Date: 06/10/2013   Time: 2:35 PM    Patient Name: Sydney [name]      Medical Record Number: [id number]  Date of Birth: [birth year] Sex: Female         Room/Bed: 4W21C/4W21C-01 Payor Info: Payor: MEDICARE / Plan: MEDICARE PART A AND B / Product Type: *No Product type* /    Admitting Diagnosis: old L BKA/New R Midfoot Amp  Admit Date/Time:  06/09/2013  3:10 PM Admission Comments: No comment available   Primary Diagnosis:  Status post transmetatarsal amputation of right foot Principal Problem: Status post transmetatarsal amputation of right foot  Patient Active Problem List   Diagnosis Date Noted  . Acute on chronic anemia 06/09/2013  . Acute renal insufficiency 06/07/2013    Class: Acute  . Status post transmetatarsal amputation of right foot 06/07/2013    Class: Acute  . Chest pain 04/29/2013  . Phantom limb pain 07/24/2012  . Diabetic neuropathy 07/24/2012  . Unilateral complete BKA-left 06/24/2012  . Osteomyelitis of toe 01/05/2012  . Osteomyelitis of toe of left foot 01/05/2012  . Chronic osteomyelitis of toe 11/09/2011  . HYPOTHYROIDISM 06/11/2007  . DIABETES MELLITUS, TYPE II 06/11/2007  . HYPERLIPIDEMIA 06/11/2007  . HYPERTENSION 06/11/2007  . Atrial fibrillation 06/11/2007  . COPD 06/11/2007    Expected Discharge Date: Expected Discharge Date: 06/24/13  Team Members Present: Physician leading conference: Dr. Faith [name] Social Worker Present: Sydney Jupiter, LCSW Nurse Present: Sydney End, RN PT Present: Other (comment) Sydney [name], PT) OT Present: Sydney [name], OT;Sydney [name], Sydney [name], OT PPS Coordinator present : Sydney [name], PT     Current Status/Progress Goal Weekly Team Focus  Medical   right mid foot amp, left bka previously  improve stamina and pain control  pre-pros ed, attempts at using prosthesis to help stabilize transfers   Bowel/Bladder   Continent of bowel and bladder   Remain continent of bowel and bladder  Monitor   Swallow/Nutrition/ Hydration             ADL's   Overall Min-Mod Assist  Overall supervision  ADL retraining, activity tolerance/endurance, functional transfers, BUE strengthening exercises   Mobility   Mod A to +2 total A for lateral transfers (secondary to fatigue and to maintain NWB on RLE)  Supervision with lateral transfers using slide board; mod I with w/c mobility  Transfer training; w/c mobility; strength/endurance training for UE's.   Communication             Safety/Cognition/ Behavioral Observations            Pain   dilaudid 2mg  q4 c/o pain 7/10 3 hrs after pain med adminstered  Pain controled at 3/10 with pain med  Treat sign and symptoms of pain q4 and prn   Skin   Incison on right foot with coban dressing  no skin breakdown and infection  Monitor skin q4  and prn    Rehab Goals Patient on target to meet rehab goals: Yes Rehab Goals Revised: pt was just evaluated on the day of team conference *See Care Plan and progress notes for long and short-term goals.  Barriers to Discharge: pain, age, ortho precautions    Possible Resolutions to Barriers:  family ed, adaptive equipment, pacing    Discharge Planning/Teaching Needs:  Pt/dtr are trying to figure out the best plan for pt at d/c.  At this time, they are considering home with dtr vs. SNF, although dtr will most  likely not be able to provide 24/7 care.  Dtr present for therapies, but can participate with family education closer to d/c as needed.   Team Discussion:  Pt was just evaluated on day of conference, but therapists report pt has lots of anxiety with transfers with the slide board and when she is leaning forward.  Pt's goals were set for heavy min assist with supervision goals with slide board.  Pt will need 24/7 care.    Revisions to Treatment Plan:  None at this time.  D/C date set for 06-24-13.   Continued Need for Acute Rehabilitation Level of Care: The  patient requires daily medical management by a physician with specialized training in physical medicine and rehabilitation for the following conditions: Daily direction of a multidisciplinary physical rehabilitation program to ensure safe treatment while eliciting the highest outcome that is of practical value to the patient.: Yes Daily medical management of patient stability for increased activity during participation in an intensive rehabilitation regime.: Yes Daily analysis of laboratory values and/or radiology reports with any subsequent need for medication adjustment of medical intervention for : Other;Post surgical problems  Sydney [name], Sydney [name] 06/11/2013, 9:41 AM

## 2013-06-11 NOTE — Progress Notes (Signed)
Subjective/Complaints: Slept a little better. Still with phantom pain   A 12 point review of systems has been performed and if not noted above is otherwise negative.   Objective: Vital Signs: Blood pressure 116/60, pulse 88, temperature 99.2 F (37.3 C), temperature source Oral, resp. rate 17, height 5\' 5"  (1.651 m), weight 76 kg (167 lb 8.8 oz), SpO2 95.00%. No results found.  Recent Labs  06/09/13 0532 06/10/13 0440  WBC 5.9 7.4  HGB 7.4* 7.7*  HCT 21.7* 22.7*  PLT 153 169    Recent Labs  06/09/13 0532 06/10/13 0440  NA 138 134*  K 3.9 4.4  CL 103 99  GLUCOSE 91 110*  BUN 30* 25*  CREATININE 1.02 0.85  CALCIUM 7.8* 8.0*   CBG (last 3)   Recent Labs  06/10/13 1636 06/10/13 2038 06/11/13 0726  GLUCAP 139* 196* 141*    Wt Readings from Last 3 Encounters:  06/11/13 76 kg (167 lb 8.8 oz)  06/09/13 75 kg (165 lb 5.5 oz)  06/09/13 75 kg (165 lb 5.5 oz)    Physical Exam:  Constitutional: She appears well-developed and well-nourished.  HENT:  Head: Normocephalic and atraumatic.  Eyes: Conjunctivae and EOM are normal. Pupils are equal, round, and reactive to light.  Neck: No JVD present. No tracheal deviation present. No thyromegaly present.  Cardiovascular: Normal rate. Normal rhythm, no murmurs  Respiratory: Effort normal. Chest clear  GI: She exhibits no distension. There is no tenderness. Bowel sounds positive Musculoskeletal:  Left BKA stump well healed. Right foot dressed with coban/immediate post-op dressing, somewhat tender. Remainder of leg is warm and neurovascularly intact.  Lymphadenopathy:  She has no cervical adenopathy.  Neurological: No cranial nerve deficit. Coordination normal.  5/5 strength UE's. RUE is 3-4/5. LLE is4/5. Sensory exam grossly intact in both upper extremities.  Psychiatric: She has a normal mood and affect. Her behavior is normal. Judgment and thought content normal with good insight and awareness   Assessment/Plan: 1.  Functional deficits secondary to right midfoot amputation (prior left BKA)  which require 3+ hours per day of interdisciplinary therapy in a comprehensive inpatient rehab setting. Physiatrist is providing close team supervision and 24 hour management of active medical problems listed below. Physiatrist and rehab team continue to assess barriers to discharge/monitor patient progress toward functional and medical goals. FIM: FIM - Bathing Bathing Steps Patient Completed: Chest;Right Arm;Left Arm;Abdomen;Front perineal area;Right upper leg;Left upper leg Bathing: 4: Min-Patient completes 8-9 48f 10 parts or 75+ percent  FIM - Upper Body Dressing/Undressing Upper body dressing/undressing steps patient completed: Thread/unthread right sleeve of pullover shirt/dresss;Thread/unthread left sleeve of pullover shirt/dress;Put head through opening of pull over shirt/dress;Pull shirt over trunk Upper body dressing/undressing: 5: Set-up assist to: Obtain clothing/put away FIM - Lower Body Dressing/Undressing Lower body dressing/undressing steps patient completed: Thread/unthread right pants leg;Thread/unthread left pants leg Lower body dressing/undressing: 3: Mod-Patient completed 50-74% of tasks  FIM - Toileting Toileting: 0: Activity did not occur  FIM - Archivist Transfers: 0-Activity did not occur  FIM - Banker Devices: Sliding board Bed/Chair Transfer: 2: Supine > Sit: Max A (lifting assist/Pt. 25-49%);2: Sit > Supine: Max A (lifting assist/Pt. 25-49%);2: Bed > Chair or W/C: Max A (lift and lower assist);2: Chair or W/C > Bed: Max A (lift and lower assist)  FIM - Locomotion: Wheelchair Distance: 150 Locomotion: Wheelchair: 0: Activity did not occur FIM - Locomotion: Ambulation Locomotion: Ambulation: 0: Activity did not occur  Comprehension Comprehension Mode: Auditory  Comprehension: 5-Understands basic 90% of the time/requires cueing <  10% of the time  Expression Expression Mode: Verbal Expression: 5-Expresses basic 90% of the time/requires cueing < 10% of the time.  Social Interaction Social Interaction: 6-Interacts appropriately with others with medication or extra time (anti-anxiety, antidepressant).  Problem Solving Problem Solving: 5-Solves basic problems: With no assist  Memory Memory: 4-Recognizes or recalls 75 - 89% of the time/requires cueing 10 - 24% of the time  Medical Problem List and Plan:  1. H/o A fib/ DVT Prophylaxis/Anticoagulation: Pharmaceutical: Coumadin  2. Neuropathy with phantom pain: continue neurontin---hesitant to titrate higher given age, etc. Will try low dose pamelor at bedtime to see if she tolerates  3. Mood: pt in good spirits. Team to provide egosupport as required.  4. Neuropsych: This patient is capable of making decisions on her own behalf.  5. HTN: Will monitor with bid checks. Continue amaryl daily and use SSI for elevated BS. Metformin discontinued due to elevated Cr >=1.4 at Admission--improved off diuretics and due to hydration   -follow i's and o's  -recheck tomorrow 6. Chronic anemia with superimposed ABLA: recheck in am--if symptomatic or lower will transfuse. Added iron supplement   -recheck tomorrow 7. DM type 2: sugars under control  8. H/o fluid overload: will check weights every day. Will need to stay on dry side. Lasix on hold currently.  9. COPD: Continue spiriva. Encourage IS as well as staying OOB during the day,.  10. Low protein stores: continue protein supplements.   LOS (Days) 2 A FACE TO FACE EVALUATION WAS PERFORMED  Gilda Abboud T 06/11/2013 8:15 AM

## 2013-06-11 NOTE — Progress Notes (Signed)
Physical Therapy Note  Patient Details  Name: Sydney [name] MRN: [id number] Date of Birth: [birth year] Today's Date: 06/11/2013  7829-5621 (60 minutes) individual Pain: no reported pain Focus of treatment: transfer training; wc mobility training; therapeutic exercise focused on bilateral UE strengthening to decrease assist with transfers Treatment: Pt in wc upon arrival; wc mobility 120 feet X 2 SBA with increased time and vcs for direction finding ( switched wc secondary to pt c/o " arms rubbing " with propulsion) ; transfer wc to mat (level) with sliding board ( setup + min/mod assist ) ; bilateral UE pushups with 2 inch blocks vs push up blocks 3 X 5 ; scooting side to side on mat with vcs for technique ; transfer mat to wc with sliding board setup + SBA ( down slope).    Sydney [name],JIM 06/11/2013, 10:39 AM

## 2013-06-11 NOTE — Progress Notes (Signed)
PHARMACY FOLLOW UP CONSULT NOTE   Pharmacy Consult :  Coumadin Indication: atrial fibrillation  Dosing Weight: 76 kg  Labs:  Recent Labs  06/08/13 0548 06/09/13 0532 06/10/13 0440  HGB  --  7.4* 7.7*  HCT  --  21.7* 22.7*  PLT  --  153 169   Lab Results  Component Value Date   INR 2.02* 06/11/2013   INR 2.98* 06/09/2013   INR 3.21* 06/08/2013   Estimated Creatinine Clearance: 44.9 ml/min (by C-G formula based on Cr of 0.85).  Medications:  Prescriptions prior to admission  Medication Sig Dispense Refill  . acetaminophen (TYLENOL) 325 MG tablet Take 1-2 tablets (325-650 mg total) by mouth every 4 (four) hours as needed.      . calcium carbonate (OS-CAL) 600 MG TABS Take 600 mg by mouth daily.      . Calcium-Vitamin D-Vitamin K ([employer] CALCIUM SOFT CHEWS PO) Take 2 tablets by mouth daily.      . Cholecalciferol (VITAMIN D3) 400 UNITS CAPS Take 400 Units by mouth daily.       . cyanocobalamin 500 MCG tablet Take 500 mcg by mouth daily.      Marland Kitchen doxycycline (VIBRAMYCIN) 100 MG capsule Take 100 mg by mouth 2 (two) times daily.       . ferrous fumarate-iron polysaccharide complex (TANDEM) 162-115.2 MG CAPS Take 1 capsule by mouth daily with breakfast.  60 capsule  1  . fish oil-omega-3 fatty acids 1000 MG capsule Take 1 g by mouth daily.      . folic acid (FOLVITE) 1 MG tablet Take 1 mg by mouth daily.      . furosemide (LASIX) 20 MG tablet Take 20 mg by mouth daily.      Marland Kitchen gabapentin (NEURONTIN) 300 MG capsule Take 300 mg by mouth 2 (two) times daily.      Marland Kitchen glimepiride (AMARYL) 4 MG tablet Take 2 mg by mouth daily before breakfast.       . levothyroxine (SYNTHROID, LEVOTHROID) 50 MCG tablet Take 50 mcg by mouth daily with breakfast.       . lisinopril-hydrochlorothiazide (PRINZIDE,ZESTORETIC) 20-25 MG per tablet Take 1 tablet by mouth daily with breakfast.       . metFORMIN (GLUCOPHAGE) 500 MG tablet Take 500 mg by mouth at bedtime.       . methocarbamol (ROBAXIN) 500 MG tablet  Take 1 tablet (500 mg total) by mouth every 6 (six) hours as needed.  45 tablet  1  . METHOTREXATE SODIUM IJ Inject 0.4 mLs as directed once a week. On tuesday      . Multiple Vitamin (MULITIVITAMIN WITH MINERALS) TABS Take 1 tablet by mouth daily.      . Multiple Vitamins-Minerals (ICAPS AREDS FORMULA PO) Take 1 capsule by mouth daily with breakfast.      . nitroGLYCERIN (NITRODUR - DOSED IN MG/24 HR) 0.2 mg/hr patch Place 1 patch onto the skin at bedtime. On the foot      . oxyCODONE-acetaminophen (ROXICET) 5-325 MG per tablet Take 1 tablet by mouth every 4 (four) hours as needed for pain.  60 tablet  0  . protein supplement (RESOURCE BENEPROTEIN) POWD Take 1 scoop by mouth 2 (two) times daily with a meal.      . tiotropium (SPIRIVA) 18 MCG inhalation capsule Place 18 mcg into inhaler and inhale daily with breakfast.       . vitamin C (ASCORBIC ACID) 500 MG tablet Take 500 mg by mouth daily.      Marland Kitchen  warfarin (COUMADIN) 4 MG tablet Take 2-4 mg by mouth See admin instructions. Take 2mg  on Monday ,wednesday, Friday.  Take 4mg  on Tuesday, Thursday, Saturday, sunday       Scheduled:  . folic acid  1 mg Oral Daily  . gabapentin  300 mg Oral BID  . glimepiride  2 mg Oral QAC breakfast  . insulin aspart  0-9 Units Subcutaneous TID WC  . iron polysaccharides  150 mg Oral BID AC  . levothyroxine  50 mcg Oral QAC breakfast  . nortriptyline  10 mg Oral QHS  . polyethylene glycol  17 g Oral Daily  . protein supplement  1 scoop Oral BID WC  . senna-docusate  1 tablet Oral q morning - 10a  . tiotropium  18 mcg Inhalation Q breakfast  . Warfarin - Pharmacist Dosing Inpatient   Does not apply q1800    Assessment: 79 y/o female continuing on Chronic Coumadin for history of atrial fibrillation.  She was admitted for an abscessed ulceration of the R-foot and received a midfoot amputation on 10/24. INR 2.02 today, but coumadin 4mg  was charted given twice as ordered. Unclear if pt. Actually received 8mg  total.    PTA Coumadin dose: 4 mg daily except 2 mg on MWF  Goal of Therapy:  INR 2-3   Plan:   Hold coumadin today, since pt. possibly received 2 does yesterday Daily INR's, Monitor for bleeding complications.   Bayard Hugger, PharmD, BCPS  Clinical Pharmacist  Pager: [phone]  06/11/2013,2:07 PM

## 2013-06-11 NOTE — Progress Notes (Signed)
Occupational Therapy Session Note  Patient Details  Name: Sydney [name] MRN: [id number] Date of Birth: 09-13-1920  Today's Date: 06/11/2013 Time: 0730-0835 Time Calculation (min): 65 min  Short Term Goals: Week 1:  OT Short Term Goal 1 (Week 1): Patient will perform LB dressing with minimal assistance from w/c level OT Short Term Goal 2 (Week 1): Patient will perform toilet transfer with minimal assistance using DME prn OT Short Term Goal 3 (Week 1): Patient will be independent with UB dressing OT Short Term Goal 4 (Week 1): Patient will be educated on a HEP  Skilled Therapeutic Interventions/Progress Updates:  Patient with "slight" pain in RLE; RN made aware.  Patient found supine in bed eating breakfast with HOB slightly raised. Therapist encouraged patient to sit edge of bed to complete breakfast, patient willing. Patient sat EOB to finish breakfast while therapist discussed d/c planning, ELOS, and goals with patient. After breakfast, patient requested to use bathroom. Attempted to perform A&P transfer for toileting, however this was unsuccessful; patient unable to coordinate movements for transfer. Patient was successful with slide board transfer; moderate assistance required. Patient able to perform peri care while seated on BSC, then transferred back to EOB for UB/LB bathing & dressing tasks. Patient then transferred edge of bed -> w/c with moderate assistance using slide board. Patient positioned in w/c with elevating leg rest -> RLE and lef BKA extended. Patient performed grooming tasks seated at sink in w/c. Left with call bell & phone within reach while patient remained seated in w/c.   Precautions:  Precautions Precautions: Fall Restrictions Weight Bearing Restrictions: Yes RLE Weight Bearing: Non weight bearing  See FIM for current functional status  Therapy/Group: Individual Therapy  Nolyn Eilert 06/11/2013, 8:46 AM

## 2013-06-11 NOTE — Progress Notes (Signed)
Social Work Patient ID: [name], female   DOB: 1921/06/22, 79 y.o.   MRN: [id number]  CSW met with pt and her dtr to update them on team conference discussion.  Team set pt's d/c date for 06-24-13, with the plan of pt going home with 24/7 assistance from family.  CSW explained that if the plan changed to pt needing SNF care at d/c rather than going home, that the d/c could actually occur sooner.  Pt's dtr is not ready to make a decision about where pt will go after d/c at this time.  Dtr has pt's prothesis and Zella Ball has agreed to come do training with pt while she is on Rehab.  Dtr is hoping this may change pt's level of independence/care at d/c, so she is not willing to make any different d/c plan, at this time, besides home with family support.  CSW spoke with pt's PT,Blair, to inform her that pt's dtr has the prosthesis and that Zella Ball would be coming to train with pt.  Carlena Sax was pleased and will work to coordinate sessions with Zella Ball.  Pt/dtr had no concerns/questions at this time.  CSW will continue to check in with pt/dtr about d/c plan and assist as needed.

## 2013-06-11 NOTE — Progress Notes (Signed)
Physical Therapy Session Note  Patient Details  Name: Sydney [name] MRN: [id number] Date of Birth: 04-05-1921  Today's Date: 06/11/2013 Time: 4098-1191 Time Calculation (min): 74 min  Short Term Goals: Week 1:  PT Short Term Goal 1 (Week 1): Pt to perform sit>supine with supervision. PT Short Term Goal 2 (Week 1): Pt to perform supine>sit with mod I and no bed rail. PT Short Term Goal 3 (Week 1): Pt to demonstrate effective management of w/c parts with min cueing. PT Short Term Goal 4 (Week 1): Patient will perform lateral scooting transfer from bed<>w/c using slide board with consistent min A.  Skilled Therapeutic Interventions/Progress Updates:    Pt received in room seated in w/c. Pt reports no pain and is agreeable to session. Pt notified this therapist that daughter had left LLE prosthetic device in pt room for use in therapy sessions. After this therapist doffed LLE shrinker, visual examination of distal L residual limb reveals no erythema or signs of skin breakdown. Therapist donned prosthetic liner and LLE prosthetic device. Patient reports no discomfort. Pt performed self-propulsion of w/c to gym x150' using bilat UE's with supervision. Once in gym, pt performed lateral scooting transfer from w/c<>mat table (w/c level) using slide board requiring mod A (to adhere to RLE weightbearing restrictions), increased time, and max verbal cues for the following to facilitate effective scooting technique: anterior weight shifting, use of head/hips relationship, utilization of LLE prosthetic device, and positioning of hands. Pt gave effective return demonstration when cued but required reinforcement during second lateral transfer. See below for therapeutic exercises performed while seated EOM. Following said exercises, pt performed self-propulsion of w/c x150' to return to pt room, where session ended. Pt performed lateral scooting transfer from w/c>bed (higher than w/c) with slide board requiring mod A  and increased time. Pt exhibited effective carryover of proper hand placement and maintained contact of L foot with floor but required verbal reinforcement for anterior weight shifting. Pt gave effective return demonstration. Sit>supine performed without bed rails with min A to control descent. LLE prosthetic device and liner doffed. No signs of pressure observed at L residual limb. LLE shrinker donned, per request of patient/daughter. Therapist departed with pt semi-reclined in bed with call bell and all needs within reach.  Therapy Documentation Precautions:  Precautions Precautions: Fall Restrictions Weight Bearing Restrictions: Yes RLE Weight Bearing: Non weight bearing Pain: Pain Assessment Pain Assessment: No/denies pain Pain Score: 0-No pain Locomotion : Wheelchair Mobility Distance: 150    Exercises:   Seated EOM using 1 lb dowel resisted by green Tbands: bilat scapular depression x10 reps, bilat elbow extension 2x10 reps to facilitate effective use of bilat UE's for lateral transfers. Anterior weight shifting x10, x5 reps with focus on bearing weight through LLE; manual assist provided to lift bilat buttocks from mat table and to adhere to RLE NWB.  See FIM for current functional status  Therapy/Group: Individual Therapy  Hobble, Lorenda Ishihara 06/11/2013, 3:16 PM

## 2013-06-11 NOTE — Progress Notes (Signed)
Occupational Therapy Note  Patient Details  Name: Sydney [name] MRN: [id number] Date of Birth: Nov 26, 1920 Today's Date: 06/11/2013  Time: 8295-6213 Pt denies pain Individual Therapy  Pt initially engaged in w/c mobility practicing navigating in cluttered environment with multpile obstacles.  Pt transitioned to BUE therex including 10 min total UE ergonometer (3 X and 1 X 1 min at level 2) followed but BUE exercises with 1 KG weighted ball.  Pt rolled back to room and remained seated in w/c with daughter present.   Lavone Neri [facility] 06/11/2013, 11:00 AM

## 2013-06-11 NOTE — Progress Notes (Signed)
Social Work Patient ID: Sydney [name], female   DOB: Jun 03, 1921, 79 y.o.   MRN: [id number]  Vista Deck Lyndy Russman, LCSW Social Worker Signed  Patient Care Conference Service date: 06/11/2013 9:41 AM  Inpatient RehabilitationTeam Conference and Plan of Care Update Date: 06/10/2013   Time: 2:35 PM     Patient Name: Sydney [name]       Medical Record Number: [id number]   Date of Birth: [birth year] Sex: Female         Room/Bed: 4W21C/4W21C-01 Payor Info: Payor: MEDICARE / Plan: MEDICARE PART A AND B / Product Type: *No Product type* /   Admitting Diagnosis: old L BKA/New R Midfoot Amp   Admit Date/Time:  06/09/2013  3:10 PM Admission Comments: No comment available   Primary Diagnosis:  Status post transmetatarsal amputation of right foot Principal Problem: Status post transmetatarsal amputation of right foot    Patient Active Problem List     Diagnosis  Date Noted   .  Acute on chronic anemia  06/09/2013   .  Acute renal insufficiency  06/07/2013       Class: Acute   .  Status post transmetatarsal amputation of right foot  06/07/2013       Class: Acute   .  Chest pain  04/29/2013   .  Phantom limb pain  07/24/2012   .  Diabetic neuropathy  07/24/2012   .  Unilateral complete BKA-left  06/24/2012   .  Osteomyelitis of toe  01/05/2012   .  Osteomyelitis of toe of left foot  01/05/2012   .  Chronic osteomyelitis of toe  11/09/2011   .  HYPOTHYROIDISM  06/11/2007   .  DIABETES MELLITUS, TYPE II  06/11/2007   .  HYPERLIPIDEMIA  06/11/2007   .  HYPERTENSION  06/11/2007   .  Atrial fibrillation  06/11/2007   .  COPD  06/11/2007     Expected Discharge Date: Expected Discharge Date: 06/24/13  Team Members Present: Physician leading conference: Dr. Faith Rogue Social Worker Present: Amada Jupiter, LCSW Nurse Present: Carmie End, RN PT Present: Other (comment) Jorje Guild, PT) OT Present: Edwin Cap, OT;Ardis Rowan, Corky Crafts, OT PPS Coordinator present : Edson Snowball,  PT        Current Status/Progress  Goal  Weekly Team Focus   Medical     right mid foot amp, left bka previously  improve stamina and pain control  pre-pros ed, attempts at using prosthesis to help stabilize transfers   Bowel/Bladder     Continent of bowel and bladder  Remain continent of bowel and bladder  Monitor   Swallow/Nutrition/ Hydration            ADL's     Overall Min-Mod Assist  Overall supervision  ADL retraining, activity tolerance/endurance, functional transfers, BUE strengthening exercises   Mobility     Mod A to +2 total A for lateral transfers (secondary to fatigue and to maintain NWB on RLE)  Supervision with lateral transfers using slide board; mod I with w/c mobility  Transfer training; w/c mobility; strength/endurance training for UE's.   Communication            Safety/Cognition/ Behavioral Observations           Pain     dilaudid 2mg  q4 c/o pain 7/10 3 hrs after pain med adminstered  Pain controled at 3/10 with pain med  Treat sign and symptoms of pain q4 and prn   Skin  Incison on right foot with coban dressing  no skin breakdown and infection  Monitor skin q4  and prn    Rehab Goals Patient on target to meet rehab goals: Yes Rehab Goals Revised: pt was just evaluated on the day of team conference *See Care Plan and progress notes for long and short-term goals.    Barriers to Discharge:  pain, age, ortho precautions      Possible Resolutions to Barriers:    family ed, adaptive equipment, pacing      Discharge Planning/Teaching Needs:    Pt/dtr are trying to figure out the best plan for pt at d/c.  At this time, they are considering home with dtr vs. SNF, although dtr will most likely not be able to provide 24/7 care.   Dtr present for therapies, but can participate with family education closer to d/c as needed.    Team Discussion:    Pt was just evaluated on day of conference, but therapists report pt has lots of anxiety with transfers with the  slide board and when she is leaning forward.  Pt's goals were set for heavy min assist with supervision goals with slide board.  Pt will need 24/7 care.     Revisions to Treatment Plan:    None at this time.  D/C date set for 06-24-13.    Continued Need for Acute Rehabilitation Level of Care: The patient requires daily medical management by a physician with specialized training in physical medicine and rehabilitation for the following conditions: Daily direction of a multidisciplinary physical rehabilitation program to ensure safe treatment while eliciting the highest outcome that is of practical value to the patient.: Yes Daily medical management of patient stability for increased activity during participation in an intensive rehabilitation regime.: Yes Daily analysis of laboratory values and/or radiology reports with any subsequent need for medication adjustment of medical intervention for : Other;Post surgical problems  Kallon Caylor, Vista Deck 06/11/2013, 9:41 AM

## 2013-06-12 ENCOUNTER — Inpatient Hospital Stay (HOSPITAL_COMMUNITY): Payer: Medicare Other | Admitting: Rehabilitation

## 2013-06-12 ENCOUNTER — Inpatient Hospital Stay (HOSPITAL_COMMUNITY): Payer: Medicare Other | Admitting: Occupational Therapy

## 2013-06-12 LAB — GLUCOSE, CAPILLARY: Glucose-Capillary: 139 mg/dL — ABNORMAL HIGH (ref 70–99)

## 2013-06-12 LAB — CBC
HCT: 22 % — ABNORMAL LOW (ref 36.0–46.0)
Hemoglobin: 7.4 g/dL — ABNORMAL LOW (ref 12.0–15.0)
MCH: 31.5 pg (ref 26.0–34.0)
MCHC: 33.6 g/dL (ref 30.0–36.0)
MCV: 93.6 fL (ref 78.0–100.0)
RDW: 14 % (ref 11.5–15.5)

## 2013-06-12 LAB — BASIC METABOLIC PANEL
BUN: 31 mg/dL — ABNORMAL HIGH (ref 6–23)
Calcium: 8.6 mg/dL (ref 8.4–10.5)
Creatinine, Ser: 0.93 mg/dL (ref 0.50–1.10)
GFR calc Af Amer: 61 mL/min — ABNORMAL LOW (ref >90)
GFR calc non Af Amer: 53 mL/min — ABNORMAL LOW (ref >90)
Glucose, Bld: 114 mg/dL — ABNORMAL HIGH (ref 70–99)

## 2013-06-12 LAB — PROTIME-INR: INR: 1.78 — ABNORMAL HIGH (ref 0.00–1.49)

## 2013-06-12 MED ORDER — WARFARIN SODIUM 5 MG PO TABS
5.0000 mg | ORAL_TABLET | Freq: Once | ORAL | Status: AC
  Administered 2013-06-12: 5 mg via ORAL
  Filled 2013-06-12: qty 1

## 2013-06-12 NOTE — Progress Notes (Signed)
Physical Therapy Session Note  Patient Details  Name: Sydney [name] MRN: [id number] Date of Birth: [birth year]  Today's Date: 06/12/2013 Time: 1308-6578 Time Calculation (min): 57 min  Short Term Goals: Week 1:  PT Short Term Goal 1 (Week 1): Pt to perform sit>supine with supervision. PT Short Term Goal 2 (Week 1): Pt to perform supine>sit with mod I and no bed rail. PT Short Term Goal 3 (Week 1): Pt to demonstrate effective management of w/c parts with min cueing. PT Short Term Goal 4 (Week 1): Patient will perform lateral scooting transfer from bed<>w/c using slide board with consistent min A.  Skilled Therapeutic Interventions/Progress Updates:   Pt received lying on mat table in gym, having finished OT session previously.  Agreeable to therapy this afternoon.  Assisted pt with donning L residual limb liner and L prosthesis.  Requires min assist for correct placement of pin prior to rolling liner up limb, and mod assist to correct don prosthesis in proper alignment.  Continue to cue her to "listen for the click" for proper placement and also to scoot forward to bear weight through prosthesis to hear continue clicks (4-6).  Assisted into // bars for attempted standing x 4 reps.  Utilized +2 assist in order to assist with standing and also to assist RLE from bearing weight and also to attempt placement of stool to bear weight through knee.  Requires max assist to stand with facilitation for forward weight shift and activation of glutes.  Also provided cues for upright head and chest for more upright standing.  Initially pt noted to have increased weight through RLE, however then had rehab tech hold RLE at knee to ensure NWB status.  Unable to get stool fully placed under R knee due to pts lack of strength to stand and increased pain in RLE.  Pt did state that LLE felt "fine" during standing.  On last attempt at standing, pt noted to be incontinent of urine, therefore had pt doff prosthesis and liner  in order to check skin.  No areas of overt erythema or skin breakdown noted, therefore assisted back to room and performed slideboard transfer w/c to bed (also performed in gym) at mod assist with continued cues for forward weight shift and forward trunk lean.  Also assist to prevent RLE weight bearing in task.  Performed several reps of rolling in order to doff wet clothing, perform peri care, and don new brief and clothing.  Pt left in bed with bed alarm set and all needs in place.    Therapy Documentation Precautions:  Precautions Precautions: Fall Restrictions Weight Bearing Restrictions: Yes RLE Weight Bearing: Non weight bearing   Vital Signs: Therapy Vitals Temp: 98.4 F (36.9 C) Temp src: Oral Pulse Rate: 85 Resp: 18 BP: 127/55 mmHg Patient Position, if appropriate: Lying Oxygen Therapy SpO2: 100 % O2 Device: None (Room air) Pain: 5/10 pain at end of session.  RN notified and repositioned in bed for comfort.      See FIM for current functional status  Therapy/Group: Individual Therapy  Vista Deck 06/12/2013, 4:37 PM

## 2013-06-12 NOTE — Progress Notes (Signed)
Physical Therapy Session Note  Patient Details  Name: Sydney [name] MRN: [id number] Date of Birth: [birth year]  Today's Date: 06/12/2013 Time: 1000-1032 Time Calculation (min): 32 min  Short Term Goals: Week 1:  PT Short Term Goal 1 (Week 1): Pt to perform sit>supine with supervision. PT Short Term Goal 2 (Week 1): Pt to perform supine>sit with mod I and no bed rail. PT Short Term Goal 3 (Week 1): Pt to demonstrate effective management of w/c parts with min cueing. PT Short Term Goal 4 (Week 1): Patient will perform lateral scooting transfer from bed<>w/c using slide board with consistent min A.  Skilled Therapeutic Interventions/Progress Updates:   Pt receiving lying in bed this morning, but agreeable to earlier PT session due to time in PTs schedule this am.  Performed supine exercises as follows on BLE:  SLR x 10 reps, knee presses x 10 reps, hip abd (gravity eliminated) x 10 reps.  In SL (R and L) performed hip ext x 10 reps, and hip abd x 10 reps.  Performed supine >sit with HOB flat and without use of handrails to better simulate home environment.  Pt able to perform at min assist with very little assist for trunk to attain seated position.  At EOB, had pt don liner in preparation for donning L prosthetic prior to transfer.  Educated pt on ensuring that liner pin is lined up on center of residual limb to ensure proper placement of prosthetic.  On first attempt, pt had small "ripple" in liner at end of limb, therefore provided mod cues for feeling liner and unrolling liner to eliminate any wrinkles in liner prior to donning.  On second attempt, pt able to don in proper manner, therefore continued with donning L prosthetic.  Educated pt to align liner pin as best as possible to pin hole in prosthetic to hear at least one click.  Then had pt scoot to EOB in order to bear weight through LLE to increase amount of clicks (4-6) to ensure proper fit prior to transfer and future standing activities.   Performed slide board transfer to the R with cues for board placement, forward trunk lean and weight shift, utilizing UEs to push with LUE and pull with RUE and assist to ensure NWB through R limb.  Note some difficulty with transfer as she requires mod assist, however board ended in placement far under pt, requiring her to do several "push ups" to retreive board.  Educated pt on how to doff prosthetic and also to perform skin checks for areas of increased erythema/skin breakdown/blisters.  None noted and also educated to use mirror in order to check all areas.  Pt left in w/c with elevating leg rest and foam pads to support pts knees in ext.  All needs in place and OT entering room for B&D session.    Therapy Documentation Precautions:  Precautions Precautions: Fall Restrictions Weight Bearing Restrictions: Yes RLE Weight Bearing: Non weight bearing   Vital Signs: Oxygen Therapy SpO2: 95 % O2 Device: None (Room air) Pain: Pt states "very low" pain this morning.   See FIM for current functional status  Therapy/Group: Individual Therapy  Vista Deck 06/12/2013, 10:41 AM

## 2013-06-12 NOTE — IPOC Note (Signed)
Overall Plan of Care [facility]) Patient Details Name: Sydney [name] MRN: [id number] DOB: [birth year]  Admitting Diagnosis: old L BKA/New R Midfoot Amp  Hospital Problems: Principal Problem:   Status post transmetatarsal amputation of right foot Active Problems:   DIABETES MELLITUS, TYPE II   Atrial fibrillation   Unilateral complete BKA-left   Diabetic neuropathy   Acute renal insufficiency   Acute on chronic anemia     Functional Problem List: Nursing Bladder;Bowel;Endurance;Medication Management;Motor;Nutrition;Pain;Perception;Safety;Sensory;Skin Integrity  PT Balance;Endurance;Motor;Pain;Sensory;Skin Integrity  OT Endurance;Balance;Motor;Pain;Safety;Skin Integrity  SLP    TR         Basic ADL's: OT Grooming;Bathing;Dressing;Toileting     Advanced  ADL's: OT       Transfers: PT Bed Mobility;Bed to Chair;Car  OT Toilet;Tub/Shower     Locomotion: PT Ambulation;Other (comment);Wheelchair Mobility (Pt currently unable to ambulate due to limited LLE prosthetic training; w/c mobility limited by endurance)     Additional Impairments: OT Other (comment) (strengthening to BUEs)  SLP        TR      Anticipated Outcomes Item Anticipated Outcome  Self Feeding Independent (at this level)  Swallowing      Basic self-care  supervision  Toileting  supervision   Bathroom Transfers supervision  Bowel/Bladder  mod I   Transfers  supervision at wheelchair level  Locomotion  supervision at wheelchair level  Communication     Cognition     Pain  min assist   Safety/Judgment  min assist    Therapy Plan: PT Intensity: Minimum of 1-2 x/day ,45 to 90 minutes PT Frequency: 5 out of 7 days PT Duration Estimated Length of Stay: 10-14 OT Intensity: Minimum of 1-2 x/day, 45 to 90 minutes OT Frequency: 5 out of 7 days OT Duration/Estimated Length of Stay: 10-14 days         Team Interventions: Nursing Interventions Patient/Family Education;Bowel Management;Disease  Management/Prevention;Pain Management;Medication Management;Skin Care/Wound Management;Cognitive Remediation/Compensation;Discharge Planning;Psychosocial Support  PT interventions Discharge planning;Functional mobility training;Patient/family education;Pain management;Neuromuscular re-education;Therapeutic Exercise;Therapeutic Activities;UE/LE Strength taining/ROM;Wheelchair propulsion/positioning;Balance/vestibular training;DME/adaptive equipment instruction;Skin care/wound management;Splinting/orthotics  OT Interventions Balance/vestibular training;Community reintegration;Discharge planning;DME/adaptive equipment instruction;Functional mobility training;Pain management;Patient/family education;Psychosocial support;Self Care/advanced ADL retraining;Skin care/wound managment;Splinting/orthotics;Therapeutic Activities;Therapeutic Exercise;UE/LE Strength taining/ROM;UE/LE Coordination activities;Wheelchair propulsion/positioning  SLP Interventions    TR Interventions    SW/CM Interventions Discharge Planning;Psychosocial Support;Patient/Family Education    Team Discharge Planning: Destination: PT-Home ,OT- Home , SLP-  Projected Follow-up: PT-Outpatient PT;24 hour supervision/assistance, OT-  Home health OT, SLP-  Projected Equipment Needs: PT-None recommended by PT, OT- None recommended by OT, SLP-  Patient/family involved in discharge planning: PT- Patient;Family member/caregiver,  OT-Patient, SLP-   MD ELOS: 10-14 days Medical Rehab Prognosis:  Excellent Assessment: The patient has been admitted for CIR therapies. The team will be addressing, functional mobility, strength, stamina, balance, safety, adaptive techniques/equipment, self-care, bowel and bladder mgt, patient and caregiver education, pre-prosthetic education, pain mgt, wound care. Goals have been set at supervision to Sydney Fishman, MD, [facility]      See Team Conference Notes for weekly updates to the plan of care

## 2013-06-12 NOTE — Plan of Care (Signed)
Problem: RH SKIN INTEGRITY Goal: RH STG MAINTAIN SKIN INTEGRITY WITH ASSISTANCE STG Maintain Skin Integrity With mod Assistance.  Outcome: Progressing Occlusive dressing to r mid-foot amputation

## 2013-06-12 NOTE — Plan of Care (Signed)
Problem: RH SKIN INTEGRITY Goal: RH STG ABLE TO PERFORM INCISION/WOUND CARE W/ASSISTANCE STG Able To Perform Incision/Wound Care With total Assistance.  Outcome: Not Progressing Total with incision/wound care

## 2013-06-12 NOTE — Progress Notes (Signed)
Subjective/Complaints: Slept well. Phantom pain better. Wondered about the one medication which is an "antidepressant"   A 12 point review of systems has been performed and if not noted above is otherwise negative.   Objective: Vital Signs: Blood pressure 132/48, pulse 84, temperature 98.3 F (36.8 C), temperature source Oral, resp. rate 17, height 5\' 5"  (1.651 m), weight 74.5 kg (164 lb 3.9 oz), SpO2 95.00%. No results found.  Recent Labs  06/10/13 0440 06/12/13 0533  WBC 7.4 4.5  HGB 7.7* 7.4*  HCT 22.7* 22.0*  PLT 169 208    Recent Labs  06/10/13 0440 06/12/13 0533  NA 134* 135  K 4.4 4.2  CL 99 101  GLUCOSE 110* 114*  BUN 25* 31*  CREATININE 0.85 0.93  CALCIUM 8.0* 8.6   CBG (last 3)   Recent Labs  06/11/13 1626 06/11/13 2036 06/12/13 0737  GLUCAP 71 171* 139*    Wt Readings from Last 3 Encounters:  06/12/13 74.5 kg (164 lb 3.9 oz)  06/09/13 75 kg (165 lb 5.5 oz)  06/09/13 75 kg (165 lb 5.5 oz)    Physical Exam:  Constitutional: She appears well-developed and well-nourished.  HENT:  Head: Normocephalic and atraumatic.  Eyes: Conjunctivae and EOM are normal. Pupils are equal, round, and reactive to light.  Neck: No JVD present. No tracheal deviation present. No thyromegaly present.  Cardiovascular: Normal rate. Normal rhythm, no murmurs  Respiratory: Effort normal. Chest clear  GI: She exhibits no distension. There is no tenderness. Bowel sounds positive Musculoskeletal:  Left BKA stump well healed. Right foot dressed with coban/immediate post-op dressing, somewhat tender. Remainder of leg is warm and neurovascularly intact.  Lymphadenopathy:  She has no cervical adenopathy.  Neurological: No cranial nerve deficit. Coordination normal.  5/5 strength UE's. RUE is 3-4/5. LLE is4/5. Sensory exam grossly intact in both upper extremities.  Psychiatric: She has a normal mood and affect. Her behavior is normal. Judgment and thought content normal with good  insight and awareness   Assessment/Plan: 1. Functional deficits secondary to right midfoot amputation (prior left BKA)  which require 3+ hours per day of interdisciplinary therapy in a comprehensive inpatient rehab setting. Physiatrist is providing close team supervision and 24 hour management of active medical problems listed below. Physiatrist and rehab team continue to assess barriers to discharge/monitor patient progress toward functional and medical goals. FIM: FIM - Bathing Bathing Steps Patient Completed: Chest;Right Arm;Left Arm;Abdomen;Front perineal area;Buttocks;Left upper leg;Right upper leg Bathing: 5: Supervision: Safety issues/verbal cues (edge of bed and supine in bed)  FIM - Upper Body Dressing/Undressing Upper body dressing/undressing steps patient completed: Thread/unthread right sleeve of pullover shirt/dresss;Thread/unthread left sleeve of pullover shirt/dress;Put head through opening of pull over shirt/dress;Pull shirt over trunk Upper body dressing/undressing: 5: Set-up assist to: Obtain clothing/put away FIM - Lower Body Dressing/Undressing Lower body dressing/undressing steps patient completed: Thread/unthread right underwear leg;Thread/unthread left underwear leg;Pull underwear up/down;Thread/unthread right pants leg;Thread/unthread left pants leg;Pull pants up/down Lower body dressing/undressing: 4: Min-Patient completed 75 plus % of tasks (bed level)  FIM - Toileting Toileting steps completed by patient: Performs perineal hygiene Toileting: 2: Max-Patient completed 1 of 3 steps  FIM - Diplomatic Services operational officer Devices: Sliding board;Bedside commode (drop arm BSC) Toilet Transfers: 3-To toilet/BSC: Mod A (lift or lower assist);3-From toilet/BSC: Mod A (lift or lower assist)  FIM - Banker Devices: Sliding board;Bed rails;Prosthesis Bed/Chair Transfer: 5: Supine > Sit: Supervision (verbal cues/safety  issues);4: Sit > Supine: Min A (steadying  pt. > 75%/lift 1 leg);3: Bed > Chair or W/C: Mod A (lift or lower assist);3: Chair or W/C > Bed: Mod A (lift or lower assist)  FIM - Locomotion: Wheelchair Distance: 150 Locomotion: Wheelchair: 5: Travels 150 ft or more: maneuvers on rugs and over door sills with supervision, cueing or coaxing FIM - Locomotion: Ambulation Locomotion: Ambulation: 0: Activity did not occur  Comprehension Comprehension Mode: Auditory Comprehension: 5-Understands basic 90% of the time/requires cueing < 10% of the time  Expression Expression Mode: Verbal Expression: 5-Expresses basic 90% of the time/requires cueing < 10% of the time.  Social Interaction Social Interaction: 6-Interacts appropriately with others with medication or extra time (anti-anxiety, antidepressant).  Problem Solving Problem Solving: 5-Solves basic problems: With no assist  Memory Memory: 4-Recognizes or recalls 75 - 89% of the time/requires cueing 10 - 24% of the time  Medical Problem List and Plan:  1. H/o A fib/ DVT Prophylaxis/Anticoagulation: Pharmaceutical: Coumadin  2. Neuropathy with phantom pain: continue neurontin bid and hs pamelor.  -this combo has worked and is tolerated so far.  -explained to her that i'm not using the pamelor as an antidepressant  3. Mood: pt in good spirits. Team to provide egosupport as required.  4. Neuropsych: This patient is capable of making decisions on her own behalf.  5. HTN: Will monitor with bid checks. Continue amaryl daily and use SSI for elevated BS. Metformin discontinued due to elevated Cr >=1.4 at Admission--   -follow i's and o's  -BUN drifting up---encourage po fluids somewhat  6. Chronic anemia with superimposed ABLA:   -hgb 7.4. Will recheck tomorrow and follow for any symptoms today. No obvious bleeding 7. DM type 2: sugars under control  8. H/o fluid overload: will check weights every day. Will need to stay on dry side. Lasix on hold  currently.  9. COPD: Continue spiriva. Encourage IS as well as staying OOB during the day,.  10. Low protein stores: continue protein supplements.   LOS (Days) 3 A FACE TO FACE EVALUATION WAS PERFORMED  [name],[name] 06/12/2013 8:35 AM

## 2013-06-12 NOTE — Plan of Care (Signed)
Problem: RH SKIN INTEGRITY Goal: RH STG SKIN FREE OF INFECTION/BREAKDOWN Skin free of infection with mod assistance  Outcome: Not Applicable Date Met:  06/12/13 Pt has occlusive coban dressing that stays on and is only taken off by surgeon.

## 2013-06-12 NOTE — Progress Notes (Signed)
Occupational Therapy Session Notes  Patient Details  Name: Sydney [name] MRN: [id number] Date of Birth: [birth year]  Today's Date: 06/12/2013  Short Term Goals: Week 1:  OT Short Term Goal 1 (Week 1): Patient will perform LB dressing with minimal assistance from w/c level OT Short Term Goal 2 (Week 1): Patient will perform toilet transfer with minimal assistance using DME prn OT Short Term Goal 3 (Week 1): Patient will be independent with UB dressing OT Short Term Goal 4 (Week 1): Patient will be educated on a HEP  Skilled Therapeutic Interventions/Progress Updates:   Session #1 1030-1130 - 60 Minutes Individual Therapy No complaints of pain Patient found seated in w/c post 30 minute PT session. Patient with no complaints of pain, but complaints of fatigue. Patient sat at w/c to perform UB/LB bathing & dressing and grooming tasks. Patient performed lateral leans for doffing underwear, peri care, and donning of clean underwear & pants. Placed bed side chair to the left of patient to help allow her more room to perform lateral lean. Patient took more than reasonable amount of time to complete tasks secondary to general fatigue; therapist encouraged rest breaks frequently. Attempted w/c pushups, patient unsuccessful with this exercise secondary to weakness throughout BUEs and patient had difficult time motor planning exercise. May downgrade goals to bed level for B&D secondary to patient easily fatigues with lateral lean activity and requires more assistance this way. At end of session, left patient seated in w/c beside bed with call bell & phone within reach.   Session #2 1345-1430 - 45 Minutes Individual Therapy Patient with complaints of pain, RN made aware and administered pain medication prn Patient propelled self from room -> therapy gym focusing on BUE strengthening. From here, patient transferred w/c -> therapy mat using slide board with minimal assistance. Skilled intervention then  focused on lateral leans and lateral trunk flexion in order to increase patient's overall trunk stability and independence with pulling underwear and pants up to waist. Patient required multiple rest breaks during session. At end of session, left patient supine on therapy mat positioned with wedge and pillows for comfort. Patient left waiting for physical therapist.   Precautions:  Precautions Precautions: Fall Restrictions Weight Bearing Restrictions: Yes RLE Weight Bearing: Non weight bearing  See FIM for current functional status  Cayden Rautio 06/12/2013, 7:13 AM

## 2013-06-12 NOTE — Progress Notes (Signed)
PHARMACY FOLLOW UP CONSULT NOTE   Pharmacy Consult :  Coumadin Indication: atrial fibrillation  Dosing Weight: 76 kg  Labs:  Recent Labs  06/08/13 0548 06/09/13 0532 06/10/13 0440  HGB  --  7.4* 7.7*  HCT  --  21.7* 22.7*  PLT  --  153 169   Lab Results  Component Value Date   INR 1.78* 06/12/2013   INR 2.02* 06/11/2013   INR 2.98* 06/09/2013   Estimated Creatinine Clearance: 40.6 ml/min (by C-G formula based on Cr of 0.93).  Medications:  Prescriptions prior to admission  Medication Sig Dispense Refill  . acetaminophen (TYLENOL) 325 MG tablet Take 1-2 tablets (325-650 mg total) by mouth every 4 (four) hours as needed.      . calcium carbonate (OS-CAL) 600 MG TABS Take 600 mg by mouth daily.      . Calcium-Vitamin D-Vitamin K ([employer] CALCIUM SOFT CHEWS PO) Take 2 tablets by mouth daily.      . Cholecalciferol (VITAMIN D3) 400 UNITS CAPS Take 400 Units by mouth daily.       . cyanocobalamin 500 MCG tablet Take 500 mcg by mouth daily.      Marland Kitchen doxycycline (VIBRAMYCIN) 100 MG capsule Take 100 mg by mouth 2 (two) times daily.       . ferrous fumarate-iron polysaccharide complex (TANDEM) 162-115.2 MG CAPS Take 1 capsule by mouth daily with breakfast.  60 capsule  1  . fish oil-omega-3 fatty acids 1000 MG capsule Take 1 g by mouth daily.      . folic acid (FOLVITE) 1 MG tablet Take 1 mg by mouth daily.      . furosemide (LASIX) 20 MG tablet Take 20 mg by mouth daily.      Marland Kitchen gabapentin (NEURONTIN) 300 MG capsule Take 300 mg by mouth 2 (two) times daily.      Marland Kitchen glimepiride (AMARYL) 4 MG tablet Take 2 mg by mouth daily before breakfast.       . levothyroxine (SYNTHROID, LEVOTHROID) 50 MCG tablet Take 50 mcg by mouth daily with breakfast.       . lisinopril-hydrochlorothiazide (PRINZIDE,ZESTORETIC) 20-25 MG per tablet Take 1 tablet by mouth daily with breakfast.       . metFORMIN (GLUCOPHAGE) 500 MG tablet Take 500 mg by mouth at bedtime.       . methocarbamol (ROBAXIN) 500 MG tablet  Take 1 tablet (500 mg total) by mouth every 6 (six) hours as needed.  45 tablet  1  . METHOTREXATE SODIUM IJ Inject 0.4 mLs as directed once a week. On tuesday      . Multiple Vitamin (MULITIVITAMIN WITH MINERALS) TABS Take 1 tablet by mouth daily.      . Multiple Vitamins-Minerals (ICAPS AREDS FORMULA PO) Take 1 capsule by mouth daily with breakfast.      . nitroGLYCERIN (NITRODUR - DOSED IN MG/24 HR) 0.2 mg/hr patch Place 1 patch onto the skin at bedtime. On the foot      . oxyCODONE-acetaminophen (ROXICET) 5-325 MG per tablet Take 1 tablet by mouth every 4 (four) hours as needed for pain.  60 tablet  0  . protein supplement (RESOURCE BENEPROTEIN) POWD Take 1 scoop by mouth 2 (two) times daily with a meal.      . tiotropium (SPIRIVA) 18 MCG inhalation capsule Place 18 mcg into inhaler and inhale daily with breakfast.       . vitamin C (ASCORBIC ACID) 500 MG tablet Take 500 mg by mouth daily.      Marland Kitchen  warfarin (COUMADIN) 4 MG tablet Take 2-4 mg by mouth See admin instructions. Take 2mg  on Monday ,wednesday, Friday.  Take 4mg  on Tuesday, Thursday, Saturday, sunday       Scheduled:  . folic acid  1 mg Oral Daily  . gabapentin  300 mg Oral BID  . glimepiride  2 mg Oral QAC breakfast  . insulin aspart  0-9 Units Subcutaneous TID WC  . iron polysaccharides  150 mg Oral BID AC  . levothyroxine  50 mcg Oral QAC breakfast  . nortriptyline  10 mg Oral QHS  . polyethylene glycol  17 g Oral Daily  . protein supplement  1 scoop Oral BID WC  . senna-docusate  1 tablet Oral q morning - 10a  . tiotropium  18 mcg Inhalation Q breakfast  . Warfarin - Pharmacist Dosing Inpatient   Does not apply q1800    Assessment: 79 y/o female continuing on chronic coumadin for history of atrial fibrillation.  She was admitted for an abscessed ulceration of the R-foot and received a midfoot amputation on 10/24. INR 1.78 today  PTA Coumadin dose: 4 mg daily except 2 mg on MWF  Goal of Therapy:  INR 2-3   Plan:   Coumadin 5mg  po x 1 Daily INR's, Monitor for bleeding complications.   Bayard Hugger, PharmD, BCPS  Clinical Pharmacist  Pager: [phone]  06/12/2013,2:22 PM

## 2013-06-13 ENCOUNTER — Inpatient Hospital Stay (HOSPITAL_COMMUNITY): Payer: Medicare Other

## 2013-06-13 ENCOUNTER — Inpatient Hospital Stay (HOSPITAL_COMMUNITY): Payer: Medicare Other | Admitting: Physical Therapy

## 2013-06-13 DIAGNOSIS — I739 Peripheral vascular disease, unspecified: Secondary | ICD-10-CM

## 2013-06-13 DIAGNOSIS — L98499 Non-pressure chronic ulcer of skin of other sites with unspecified severity: Secondary | ICD-10-CM

## 2013-06-13 DIAGNOSIS — S88119A Complete traumatic amputation at level between knee and ankle, unspecified lower leg, initial encounter: Secondary | ICD-10-CM

## 2013-06-13 DIAGNOSIS — D62 Acute posthemorrhagic anemia: Secondary | ICD-10-CM

## 2013-06-13 DIAGNOSIS — S98919A Complete traumatic amputation of unspecified foot, level unspecified, initial encounter: Secondary | ICD-10-CM

## 2013-06-13 LAB — GLUCOSE, CAPILLARY
Glucose-Capillary: 110 mg/dL — ABNORMAL HIGH (ref 70–99)
Glucose-Capillary: 131 mg/dL — ABNORMAL HIGH (ref 70–99)
Glucose-Capillary: 86 mg/dL (ref 70–99)
Glucose-Capillary: 97 mg/dL (ref 70–99)

## 2013-06-13 LAB — BASIC METABOLIC PANEL
Calcium: 8.6 mg/dL (ref 8.4–10.5)
Chloride: 102 mEq/L (ref 96–112)
GFR calc Af Amer: 64 mL/min — ABNORMAL LOW (ref >90)
GFR calc non Af Amer: 55 mL/min — ABNORMAL LOW (ref >90)
Glucose, Bld: 133 mg/dL — ABNORMAL HIGH (ref 70–99)
Potassium: 4.3 mEq/L (ref 3.5–5.1)
Sodium: 136 mEq/L (ref 135–145)

## 2013-06-13 LAB — PROTIME-INR: INR: 1.5 — ABNORMAL HIGH (ref 0.00–1.49)

## 2013-06-13 LAB — HEMOGLOBIN AND HEMATOCRIT, BLOOD
HCT: 21.5 % — ABNORMAL LOW (ref 36.0–46.0)
Hemoglobin: 7.3 g/dL — ABNORMAL LOW (ref 12.0–15.0)

## 2013-06-13 MED ORDER — WARFARIN SODIUM 4 MG PO TABS
4.0000 mg | ORAL_TABLET | Freq: Once | ORAL | Status: AC
  Administered 2013-06-13: 4 mg via ORAL
  Filled 2013-06-13: qty 1

## 2013-06-13 NOTE — Plan of Care (Signed)
Problem: RH PAIN MANAGEMENT Goal: RH STG PAIN MANAGED AT OR BELOW PT'S PAIN GOAL Less than 4  Outcome: Not Progressing Rate pain at 6

## 2013-06-13 NOTE — Progress Notes (Signed)
Subjective/Complaints: Phantom pain controlled. Generally feels sore from therapy yesterday    A 12 point review of systems has been performed and if not noted above is otherwise negative.   Objective: Vital Signs: Blood pressure 130/58, pulse 81, temperature 98.1 F (36.7 C), temperature source Oral, resp. rate 18, height 5\' 5"  (1.651 m), weight 76.1 kg (167 lb 12.3 oz), SpO2 97.00%. No results found.  Recent Labs  06/12/13 0533 06/13/13 0500  WBC 4.5  --   HGB 7.4* 7.3*  HCT 22.0* 21.5*  PLT 208  --     Recent Labs  06/12/13 0533 06/13/13 0500  NA 135 136  K 4.2 4.3  CL 101 102  GLUCOSE 114* 133*  BUN 31* 28*  CREATININE 0.93 0.89  CALCIUM 8.6 8.6   CBG (last 3)   Recent Labs  06/12/13 1702 06/12/13 2031 06/13/13 0725  GLUCAP 88 155* 110*    Wt Readings from Last 3 Encounters:  06/13/13 76.1 kg (167 lb 12.3 oz)  06/09/13 75 kg (165 lb 5.5 oz)  06/09/13 75 kg (165 lb 5.5 oz)    Physical Exam:  Constitutional: She appears well-developed and well-nourished.  HENT:  Head: Normocephalic and atraumatic.  Eyes: Conjunctivae and EOM are normal. Pupils are equal, round, and reactive to light.  Neck: No JVD present. No tracheal deviation present. No thyromegaly present.  Cardiovascular: Normal rate. Normal rhythm, no murmurs  Respiratory: Effort normal. Chest clear  GI: She exhibits no distension. There is no tenderness. Bowel sounds positive Musculoskeletal:  Left BKA stump well healed. Right foot dressed with coban/immediate post-op dressing, somewhat tender. Remainder of leg is warm and neurovascularly intact.  Lymphadenopathy:  She has no cervical adenopathy.  Neurological: No cranial nerve deficit. Coordination normal.  5/5 strength UE's. RUE is 3-4/5. LLE is4/5. Sensory exam grossly intact in both upper extremities.  Psychiatric: She has a normal mood and affect. Her behavior is normal. Judgment and thought content normal with good insight and  awareness   Assessment/Plan: 1. Functional deficits secondary to right midfoot amputation (prior left BKA)  which require 3+ hours per day of interdisciplinary therapy in a comprehensive inpatient rehab setting. Physiatrist is providing close team supervision and 24 hour management of active medical problems listed below. Physiatrist and rehab team continue to assess barriers to discharge/monitor patient progress toward functional and medical goals. FIM: FIM - Bathing Bathing Steps Patient Completed: Chest;Right Arm;Left Arm;Abdomen;Left upper leg;Right upper leg;Front perineal area Bathing: 4: Min-Patient completes 8-9 85f 10 parts or 75+ percent (seated in w/c)  FIM - Upper Body Dressing/Undressing Upper body dressing/undressing steps patient completed: Thread/unthread right sleeve of pullover shirt/dresss;Thread/unthread left sleeve of pullover shirt/dress;Put head through opening of pull over shirt/dress;Pull shirt over trunk Upper body dressing/undressing: 5: Set-up assist to: Obtain clothing/put away FIM - Lower Body Dressing/Undressing Lower body dressing/undressing steps patient completed: Thread/unthread right underwear leg;Thread/unthread left underwear leg;Thread/unthread right pants leg;Thread/unthread left pants leg Lower body dressing/undressing: 3: Mod-Patient completed 50-74% of tasks  FIM - Toileting Toileting steps completed by patient: Performs perineal hygiene Toileting: 0: Activity did not occur  FIM - Diplomatic Services operational officer Devices: Sliding board;Bedside commode (drop arm BSC) Toilet Transfers: 0-Activity did not occur  FIM - Press photographer Assistive Devices: Sliding board;Prosthesis;Arm rests Bed/Chair Transfer: 3: Chair or W/C > Bed: Mod A (lift or lower assist);4: Sit > Supine: Min A (steadying pt. > 75%/lift 1 leg)  FIM - Locomotion: Wheelchair Distance: 150 Locomotion: Wheelchair: 5: Travels 150  ft or more:  maneuvers on rugs and over door sills with supervision, cueing or coaxing FIM - Locomotion: Ambulation Locomotion: Ambulation: 0: Activity did not occur  Comprehension Comprehension Mode: Auditory Comprehension: 5-Understands complex 90% of the time/Cues < 10% of the time  Expression Expression Mode: Verbal Expression: 6-Expresses complex ideas: With extra time/assistive device  Social Interaction Social Interaction: 6-Interacts appropriately with others with medication or extra time (anti-anxiety, antidepressant).  Problem Solving Problem Solving: 5-Solves basic 90% of the time/requires cueing < 10% of the time  Memory Memory: 5-Recognizes or recalls 90% of the time/requires cueing < 10% of the time  Medical Problem List and Plan:  1. H/o A fib/ DVT Prophylaxis/Anticoagulation: Pharmaceutical: Coumadin  2. Neuropathy with phantom pain: continue neurontin bid and hs pamelor.  -this combo has worked and is tolerated so far.  -explained to her that i'm not using the pamelor as an antidepressant  3. Mood: pt in good spirits. Team to provide egosupport as required.  4. Neuropsych: This patient is capable of making decisions on her own behalf.  5. HTN: Will monitor with bid checks. Continue amaryl daily and use SSI for elevated BS. Metformin discontinued due to elevated Cr >=1.4 at Admission--   -follow i's and o's  -BUN better today  6. Chronic anemia with superimposed ABLA:   -hgb 7.3. May need to consider transfusing---continue to check daily and monitor her tolerance of activities with therapy 7. DM type 2: sugars under control  8. H/o fluid overload: will check weights every day. Will need to stay on dry side. Lasix on hold currently.  9. COPD: Continue spiriva. Encourage IS as well as staying OOB during the day. 10. Low protein stores: continue protein supplements.   LOS (Days) 4 A FACE TO FACE EVALUATION WAS PERFORMED  [name],[name] 06/13/2013 9:48 AM

## 2013-06-13 NOTE — Plan of Care (Signed)
Problem: RH BOWEL ELIMINATION Goal: RH STG MANAGE BOWEL WITH ASSISTANCE STG Manage Bowel with Assistance mod I Outcome: Progressing Continent per report

## 2013-06-13 NOTE — Progress Notes (Signed)
ANTICOAGULATION CONSULT NOTE - Follow Up Consult  Pharmacy Consult for coumadin Indication: atrial fibrillation  Allergies  Allergen Reactions  . Other     Narcotics=makes her very disoriented  . Percocet [Oxycodone-Acetaminophen] Other (See Comments)    Dilerium, hallucinations  . Tramadol Anxiety  . Vicodin [Hydrocodone-Acetaminophen] Anxiety    Patient Measurements: Height: 5\' 5"  (165.1 cm) Weight: 167 lb 12.3 oz (76.1 kg) IBW/kg (Calculated) : 57 Heparin Dosing Weight:   Vital Signs: Temp: 98.1 F (36.7 C) (10/31 0501) Temp src: Oral (10/31 0501) BP: 130/58 mmHg (10/31 0501) Pulse Rate: 81 (10/31 0501)  Labs:  Recent Labs  06/11/13 0519 06/12/13 0533 06/13/13 0500  HGB  --  7.4* 7.3*  HCT  --  22.0* 21.5*  PLT  --  208  --   LABPROT 22.2* 20.2* 17.7*  INR 2.02* 1.78* 1.50*  CREATININE  --  0.93 0.89    Estimated Creatinine Clearance: 42.8 ml/min (by C-G formula based on Cr of 0.89).   Medications:  Scheduled:  . folic acid  1 mg Oral Daily  . gabapentin  300 mg Oral BID  . glimepiride  2 mg Oral QAC breakfast  . insulin aspart  0-9 Units Subcutaneous TID WC  . iron polysaccharides  150 mg Oral BID AC  . levothyroxine  50 mcg Oral QAC breakfast  . nortriptyline  10 mg Oral QHS  . polyethylene glycol  17 g Oral Daily  . protein supplement  1 scoop Oral BID WC  . senna-docusate  1 tablet Oral q morning - 10a  . tiotropium  18 mcg Inhalation Q breakfast  . Warfarin - Pharmacist Dosing Inpatient   Does not apply q1800   Infusions:    Assessment: 79 yo female with hx of afib is currently on subtherapeutic coumadin.  INR today is 1.5.  Coumadin was held on 10/29 because coumadin 4mg  was charted given twice as ordered. Unclear if pt. actually received 8mg  total. Patient was therapeutic on home regimen of 4mg  daily except 2mg  on MWF previously.  Goal of Therapy:  INR 2-3 Monitor platelets by anticoagulation protocol: Yes   Plan:  1) Coumadin 4mg  po  x1 2) INR in am  Olla Delancey, Tsz-Yin 06/13/2013,8:37 AM

## 2013-06-13 NOTE — Progress Notes (Signed)
Occupational Therapy Session Note  Patient Details  Name: Sydney [name] MRN: [id number] Date of Birth: May 21, 1921  Today's Date: 06/13/2013 Time: 1100-1157 Time Calculation (min): 57 min  Short Term Goals: Week 1:  OT Short Term Goal 1 (Week 1): Patient will perform LB dressing with minimal assistance from w/c level OT Short Term Goal 2 (Week 1): Patient will perform toilet transfer with minimal assistance using DME prn OT Short Term Goal 3 (Week 1): Patient will be independent with UB dressing OT Short Term Goal 4 (Week 1): Patient will be educated on a HEP  Skilled Therapeutic Interventions/Progress Updates:   Patient resting in w/c upon arrival.  Pt rolled w/c to ADL apartment to practice w/c<>bed sliding board transfers requiring min A with assistance holding RLE off floor to maintain NWB.  Pt engaged in bed mobility including reciprocal scooting forward and backward on bed.  Pt transitioned to therapy gym for BUE therex with theraband and weighted balls (1 kg). Pt rolled back to room and remained in w/c with friend at side and all needs within reach.  Therapy Documentation Precautions:  Precautions Precautions: Fall Restrictions Weight Bearing Restrictions: Yes RLE Weight Bearing: Non weight bearing   Pain: Pain Assessment Pain Assessment: No/denies pain  See FIM for current functional status  Therapy/Group: Individual Therapy  Rich Brave 06/13/2013, 12:01 PM

## 2013-06-13 NOTE — Progress Notes (Signed)
Physical Therapy Session Note  Patient Details  Name: Sydney [name] MRN: [id number] Date of Birth: [birth year]  Today's Date: 06/13/2013 Time: 0900-1000 session one            1430-1545 Time Calculation (min): 60 min session one                                              75 session two  Short Term Goals: Week 1:  PT Short Term Goal 1 (Week 1): Pt to perform sit>supine with supervision. PT Short Term Goal 2 (Week 1): Pt to perform supine>sit with mod I and no bed rail. PT Short Term Goal 3 (Week 1): Pt to demonstrate effective management of w/c parts with min cueing. PT Short Term Goal 4 (Week 1): Patient will perform lateral scooting transfer from bed<>w/c using slide board with consistent min A.  Skilled Therapeutic Interventions/Progress Updates: Pt seen in two sessions:  Session One:    Pt received supine in bed. Performed bed mobs, roll side to side with supervision assist to donn continence product and pants, supine to sit with supervision without bed rails and with bed flat requires vcs to maintain NWB to R LE. Sit EOB with mod I to Donn shirt and prothesis, requires max vcs to donn prosthetic sleeve and max assit to donn prosthesis to cue to hearing the "click". Sliding board transfer bed to w/c with mod assist with mod cues to improve hand and foot placement, technique and maintaining NWB of R LE. Wheelchair mobility with supervision assist. Doffing of shrinker sleeve with min A to ensure no wrinkles to maintain skin integrity.    Session two:   Pt received sitting in w/c. Continued with prosthetic training, provided pt with mirror to be able to perform skin checks to L LE and to check for wrinkling of sleeve. Pt required vcs for proper placement of sleeve, unable to recall which side is on top. Once redirected pt able to complete with supervision assit and utilized mirror, mod/max assist with donning prosthesis. Wheelchair mobility to gym. Attempted sit to stand x 2 in // bars with  +3 one to hold w/c on to hold R LE and attempt to place on stool to WB through R knee and one to steady w/c. Pt on first attempt came to almost full standing and tolerated about 10 seconds, c/o tenderness in R shin region from previous sessions and requested sitting, second attempt pt unable to get upright and sat after 3-5 seconds due to pain in R shin. Transfer training to mat x 2 with sliding board x2 with mod A to L side as pt has used the L more often PTA. Seated B LE/UE strengthening with 1 lb bar, no weights and theraband x 20 reps. Pt returned to room doffed prosthesis with min assist and requested not doffing limb shrinker at this time.   Therapy Documentation Precautions:  Precautions Precautions: Fall Restrictions Weight Bearing Restrictions: Yes RLE Weight Bearing: Non weight bearing Per MD ok to WB through R knee only    Pain: Pain Assessment Pain Assessment: 0-10 Pain Score: 6 Pain Type: Acute pain Pain Location: Foot Pain Orientation: Right Pain Intervention(s): Medication (See eMAR)                 See FIM for current functional status  Therapy/Group: Individual Therapy both sessions  Shiela Mayer A 06/13/2013, 11:23 AM

## 2013-06-14 ENCOUNTER — Inpatient Hospital Stay (HOSPITAL_COMMUNITY): Payer: Medicare Other | Admitting: Occupational Therapy

## 2013-06-14 ENCOUNTER — Encounter (HOSPITAL_COMMUNITY): Payer: Medicare Other | Admitting: Occupational Therapy

## 2013-06-14 ENCOUNTER — Inpatient Hospital Stay (HOSPITAL_COMMUNITY): Payer: Medicare Other | Admitting: Physical Therapy

## 2013-06-14 LAB — HEMOGLOBIN AND HEMATOCRIT, BLOOD
HCT: 22.1 % — ABNORMAL LOW (ref 36.0–46.0)
Hemoglobin: 7.5 g/dL — ABNORMAL LOW (ref 12.0–15.0)

## 2013-06-14 LAB — GLUCOSE, CAPILLARY
Glucose-Capillary: 110 mg/dL — ABNORMAL HIGH (ref 70–99)
Glucose-Capillary: 165 mg/dL — ABNORMAL HIGH (ref 70–99)

## 2013-06-14 LAB — PROTIME-INR: INR: 1.58 — ABNORMAL HIGH (ref 0.00–1.49)

## 2013-06-14 MED ORDER — WARFARIN SODIUM 4 MG PO TABS
4.0000 mg | ORAL_TABLET | Freq: Once | ORAL | Status: AC
  Administered 2013-06-14: 4 mg via ORAL
  Filled 2013-06-14: qty 1

## 2013-06-14 NOTE — Progress Notes (Signed)
Occupational Therapy Session Note  Patient Details  Name: Sydney [name] MRN: [id number] Date of Birth: January 13, 1921  Today's Date: 06/14/2013 Time: 1300-1330 Time Calculation (min): 30 min  Short Term Goals: Week 1:  OT Short Term Goal 1 (Week 1): Patient will perform LB dressing with minimal assistance from w/c level OT Short Term Goal 2 (Week 1): Patient will perform toilet transfer with minimal assistance using DME prn OT Short Term Goal 3 (Week 1): Patient will be independent with UB dressing OT Short Term Goal 4 (Week 1): Patient will be educated on a HEP  Skilled Therapeutic Interventions/Progress Updates:    1:1 continued to work on recalling steps for slide board transfers and how to verbalize to staff when assisting her. Performed bed mobility with bed rail with supervision and slide board transfer bed to w/c with assistance to setup board, hold board in place and steady A to pt during transfer. Steps written down for her and posted above bed for her to recall so she can better direct care for transfers. Pt still reports her transfers with different staff vary depending how they set her up.   Also continued to discuss and practice weight shift while in the chair to avoid pressure sores.  ,  Therapy Documentation Precautions:  Precautions Precautions: Fall Restrictions Weight Bearing Restrictions: Yes RLE Weight Bearing: Non weight bearing Vital Signs:   Pain: Pain Assessment Pain Assessment: 0-10 Pain Score: 6  Pain Type: Acute pain Pain Location: Foot Pain Orientation: Right Pain Descriptors / Indicators: Sore After she put her right foot down during a transfer with nursing when she felt was going to fall forward and put it down to balance herself.   See FIM for current functional status  Therapy/Group: Individual Therapy  Roney Mans [facility] 06/14/2013, 2:21 PM

## 2013-06-14 NOTE — Progress Notes (Signed)
Subjective/Complaints: Had a much better night. Not as "sore" today. Phantom pain still controlled   A 12 point review of systems has been performed and if not noted above is otherwise negative.   Objective: Vital Signs: Blood pressure 136/55, pulse 89, temperature 97.1 F (36.2 C), temperature source Oral, resp. rate 18, height 5\' 5"  (1.651 m), weight 74.1 kg (163 lb 5.8 oz), SpO2 98.00%. No results found.  Recent Labs  06/12/13 0533 06/13/13 0500 06/14/13 0550  WBC 4.5  --   --   HGB 7.4* 7.3* 7.5*  HCT 22.0* 21.5* 22.1*  PLT 208  --   --     Recent Labs  06/12/13 0533 06/13/13 0500  NA 135 136  K 4.2 4.3  CL 101 102  GLUCOSE 114* 133*  BUN 31* 28*  CREATININE 0.93 0.89  CALCIUM 8.6 8.6   CBG (last 3)   Recent Labs  06/13/13 1631 06/13/13 2054 06/14/13 0719  GLUCAP 86 97 110*    Wt Readings from Last 3 Encounters:  06/14/13 74.1 kg (163 lb 5.8 oz)  06/09/13 75 kg (165 lb 5.5 oz)  06/09/13 75 kg (165 lb 5.5 oz)    Physical Exam:  Constitutional: She appears well-developed and well-nourished.  HENT:  Head: Normocephalic and atraumatic.  Eyes: Conjunctivae and EOM are normal. Pupils are equal, round, and reactive to light.  Neck: No JVD present. No tracheal deviation present. No thyromegaly present.  Cardiovascular: Normal rate. Normal rhythm, no murmurs  Respiratory: Effort normal. Chest clear  GI: She exhibits no distension. There is no tenderness. Bowel sounds positive Musculoskeletal:  Left BKA stump well healed. Right foot dressed with coban/immediate post-op dressing, somewhat tender. Remainder of leg is warm and neurovascularly intact.  Lymphadenopathy:  She has no cervical adenopathy.  Neurological: No cranial nerve deficit. Coordination normal.  5/5 strength UE's. RUE is 3-4/5. LLE is4/5. Sensory exam grossly intact in both upper extremities.  Psychiatric: She has a normal mood and affect. Her behavior is normal. Judgment and thought content  normal with good insight and awareness   Assessment/Plan: 1. Functional deficits secondary to right midfoot amputation (prior left BKA)  which require 3+ hours per day of interdisciplinary therapy in a comprehensive inpatient rehab setting. Physiatrist is providing close team supervision and 24 hour management of active medical problems listed below. Physiatrist and rehab team continue to assess barriers to discharge/monitor patient progress toward functional and medical goals. FIM: FIM - Bathing Bathing Steps Patient Completed: Chest;Right Arm;Left Arm;Abdomen;Left upper leg;Right upper leg;Front perineal area Bathing: 4: Min-Patient completes 8-9 46f 10 parts or 75+ percent (seated in w/c)  FIM - Upper Body Dressing/Undressing Upper body dressing/undressing steps patient completed: Thread/unthread right sleeve of pullover shirt/dresss;Thread/unthread left sleeve of pullover shirt/dress;Put head through opening of pull over shirt/dress;Pull shirt over trunk Upper body dressing/undressing: 5: Set-up assist to: Obtain clothing/put away FIM - Lower Body Dressing/Undressing Lower body dressing/undressing steps patient completed: Thread/unthread right underwear leg;Thread/unthread left underwear leg;Thread/unthread right pants leg;Thread/unthread left pants leg Lower body dressing/undressing: 3: Mod-Patient completed 50-74% of tasks  FIM - Toileting Toileting steps completed by patient: Performs perineal hygiene Toileting: 0: Activity did not occur  FIM - Diplomatic Services operational officer Devices: Sliding board;Bedside commode Toilet Transfers: 2-To toilet/BSC: Max A (lift and lower assist)  FIM - Press photographer Assistive Devices: Sliding board;Prosthesis;Arm rests Bed/Chair Transfer: 2: Chair or W/C > Bed: Max A (lift and lower assist)  FIM - Locomotion: Wheelchair Distance: 150 Locomotion: Wheelchair:  5: Travels 150 ft or more: maneuvers on rugs and over  door sills with supervision, cueing or coaxing FIM - Locomotion: Ambulation Locomotion: Ambulation: 0: Activity did not occur  Comprehension Comprehension Mode: Auditory Comprehension: 5-Understands complex 90% of the time/Cues < 10% of the time  Expression Expression Mode: Verbal Expression: 5-Expresses complex 90% of the time/cues < 10% of the time  Social Interaction Social Interaction: 7-Interacts appropriately with others - No medications needed.  Problem Solving Problem Solving: 5-Solves complex 90% of the time/cues < 10% of the time  Memory Memory: 5-Requires cues to use assistive device  Medical Problem List and Plan:  1. H/o A fib/ DVT Prophylaxis/Anticoagulation: Pharmaceutical: Coumadin  2. Neuropathy with phantom pain: continue neurontin bid and hs pamelor.  -this combo has worked and is tolerated so far.  -explained to her that i'm not using the pamelor as an antidepressant  3. Mood: pt in good spirits. Team to provide egosupport as required.  4. Neuropsych: This patient is capable of making decisions on her own behalf.  5. HTN: Will monitor with bid checks. Continue amaryl daily and use SSI for elevated BS. Metformin discontinued due to elevated Cr >=1.4 at Admission--   -follow i's and o's  -BUN better today  6. Chronic anemia with superimposed ABLA:   -hgb 7.4. May need to consider transfusing---but holding steady. Recheck Monday 7. DM type 2: sugars under control  8. H/o fluid overload: will check weights every day. Will need to stay on dry side. Lasix on hold currently.  9. COPD: Continue spiriva. Encourage IS as well as staying OOB during the day. 10. Low protein stores: continue protein supplements.   LOS (Days) 5 A FACE TO FACE EVALUATION WAS PERFORMED  SydneyZACHARY [name] 06/14/2013 8:20 AM

## 2013-06-14 NOTE — Progress Notes (Signed)
Physical Therapy Note  Patient Details  Name: FLORAINE BUECHLER MRN: [id number] Date of Birth: 1920/11/08 Today's Date: 06/14/2013  1478-2956 (55 minutes) individual Pain: no initial c/o pain ; pt reports pain at distal end of left BKA with application of prosthesis (pt not wearing left stump shrinker) Focus of treatment: transfer training ; wc mobility for general conditioning/activity tolerance; therapeutic exercises focused on bilateral UE strengthening to improve transfers Treatment: Pt in bed upon arrival; pt required assist to finish donning slacks in supine (rolling side to side) ; transfer - sliding board setup + min assist RT LE to prevent weight bearing; wc mobility - 120 feet X 2 SBA with increased time; Therapeutic exercises- bilateral UE scapular rows, elbow extension, shoulder depression 2 X 15.    1345-1425 (40 minutes) individual Pain: no reported pain Focus of treatment: transfer training (wc >< recliner, mat >< recliner) Treatment: pt in wc upon arrival ; therapist able to donn Left BKA prosthesis without c/o pain this PM; wc to recliner with sliding board - setup + close SBA for safety with tactile cues for NWB RT foot (level); recliner to mat ( min up incline) close SBA with increased time ; mat to recline (down slope) SBA (all transfers with left prosthesis)  ; returned to room in recliner with all needs within reach.  Tesslyn Baumert,JIM 06/14/2013, 9:25 AM

## 2013-06-14 NOTE — Progress Notes (Signed)
Patient reported putting some weight down on right foot during transfer. Dressing clean dry and intact. Patient reports pain 8 out of 10. 650 tylenol given per patient request. Dr Riley Kill notified. No new orders received. Keeping elevated in bed. Spoke with patient daughter on phone concerning transfer. Continue with plan of care.

## 2013-06-14 NOTE — Progress Notes (Signed)
ANTICOAGULATION CONSULT NOTE - Follow Up Consult  Pharmacy Consult for Warfarin Indication: atrial fibrillation  Allergies  Allergen Reactions  . Other     Narcotics=makes her very disoriented  . Percocet [Oxycodone-Acetaminophen] Other (See Comments)    Dilerium, hallucinations  . Tramadol Anxiety  . Vicodin [Hydrocodone-Acetaminophen] Anxiety    Patient Measurements: Height: 5\' 5"  (165.1 cm) Weight: 163 lb 5.8 oz (74.1 kg) IBW/kg (Calculated) : 57  Vital Signs: Temp: 97.1 F (36.2 C) (11/01 0439) Temp src: Oral (11/01 0439) BP: 136/55 mmHg (11/01 0439) Pulse Rate: 89 (11/01 0439)  Labs:  Recent Labs  06/12/13 0533 06/13/13 0500 06/14/13 0550  HGB 7.4* 7.3* 7.5*  HCT 22.0* 21.5* 22.1*  PLT 208  --   --   LABPROT 20.2* 17.7* 18.4*  INR 1.78* 1.50* 1.58*  CREATININE 0.93 0.89  --     Estimated Creatinine Clearance: 42.3 ml/min (by C-G formula based on Cr of 0.89).   Assessment: 79 y.o. F who continues on warfarin for hx Afib with a SUBtherapeutic INR this morning though trending back up (INR 1.58 << 1.5, goal of 2-3). Warfarin was held on 10/29 because warfarin 4 mg was charted twice as given -- it is unclear if the patient actually received a total of 8 mg. Hgb/Hct low but stable, plts wnl on 10/30. No overt s/sx of bleeding noted at this time.   Goal of Therapy:  INR 2-3   Plan:  1. Warfarin 4 mg x 1 dose at 1800 today 2. Will continue to monitor for any signs/symptoms of bleeding and will follow up with PT/INR in the a.m.   Georgina Pillion, PharmD, BCPS Clinical Pharmacist Pager: [phone] 06/14/2013 2:32 PM

## 2013-06-14 NOTE — Progress Notes (Signed)
Occupational Therapy Session Note  Patient Details  Name: Sydney [name] MRN: [id number] Date of Birth: 11/21/1920  Today's Date: 06/14/2013 Time: 1030-1123 Time Calculation (min): 53 min  Short Term Goals: Week 1:  OT Short Term Goal 1 (Week 1): Patient will perform LB dressing with minimal assistance from w/c level OT Short Term Goal 2 (Week 1): Patient will perform toilet transfer with minimal assistance using DME prn OT Short Term Goal 3 (Week 1): Patient will be independent with UB dressing OT Short Term Goal 4 (Week 1): Patient will be educated on a HEP  Skilled Therapeutic Interventions/Progress Updates:    1:1 Pt already bathe and dressed this am. Discussed what goals were important to her to focus on in the session; pt reported toileting. She reports staff assist her too much with the transfer and she reports not using the slide board with staff other than therapy. Focus on slide board transfers to drop arm commode in her room with recall of method most successful and how to verbalize to staff how they should assist. Lowered the BSC to make a more even transfer (w/c<> drop arm BSC). Pt performed transfer to [facility] with min A with A to steady the pt and the board and was given extra time. Focused on lateral leans (including leaning into the w/c on her left) to pull down pants. During pulling pants back up with lateral leans, pt became incontinent of stool (she reports due to meds). Donned clean brief and pants- pt requires max A to pull pants up. Focused on achieving a good lateral lean to left hip up to clear pants. Min A slide board transfer back to the w/c.   Therapy Documentation Precautions:  Precautions Precautions: Fall Restrictions Weight Bearing Restrictions: Yes RLE Weight Bearing: Non weight bearing General: General Amount of Missed OT Time (min): 7 Minutes Missed Time Reason: Patient fatigue Pain:  no c/o pain in session; reports residual limb on left is swollen today.    See FIM for current functional status  Therapy/Group: Individual Therapy  Roney Mans [facility] 06/14/2013, 11:16 AM

## 2013-06-15 ENCOUNTER — Inpatient Hospital Stay (HOSPITAL_COMMUNITY): Payer: Medicare Other | Admitting: Occupational Therapy

## 2013-06-15 LAB — GLUCOSE, CAPILLARY
Glucose-Capillary: 120 mg/dL — ABNORMAL HIGH (ref 70–99)
Glucose-Capillary: 167 mg/dL — ABNORMAL HIGH (ref 70–99)
Glucose-Capillary: 148 mg/dL — ABNORMAL HIGH (ref 70–99)
Glucose-Capillary: 211 mg/dL — ABNORMAL HIGH (ref 70–99)

## 2013-06-15 MED ORDER — WARFARIN SODIUM 3 MG PO TABS
3.0000 mg | ORAL_TABLET | Freq: Once | ORAL | Status: AC
  Administered 2013-06-15: 3 mg via ORAL
  Filled 2013-06-15: qty 1

## 2013-06-15 NOTE — Progress Notes (Signed)
Occupational Therapy Session Note  Patient Details  Name: Sydney [name] MRN: [id number] Date of Birth: Apr 19, 1921  Today's Date: 06/15/2013 Time: 4098-1191 Time Calculation (min): 75 min   Skilled Therapeutic Interventions/Progress Updates:  Though patient was snuggled under covers and complained of being extremely cold, she concurred to transfer bed to w/c via sliding board with Max A (patient unable to [facility] or shift her hips to assist with the slide) and complete OT ADL in w/c at sink.   While completing bath at sink, patient stated, she needed to use the drop arm BSC to complete BM after she was given a laxative yesterday and she keeps going.  She required overall Max A to complete cleansing of buttocks and clothing mgmt, requiring much assst to don tight pants over both legs and pull up-even over both thighs, hips and buttocks.  She attempted lateral leans, but had difficulty pulling up the tight pants.                She required Max A for w/c to/fr drop arm BSC for toilet transfer as again she stated, "I am having trouble moving my bottom this morning."  Patient elected not to don her left prosthesis this am and stated, "I had just started to work with it when this other happened."    While completing oral care (assist to obtain items), Facilities came in to try to improve her heat.  (This clinician asked Facilities who happened to be working on the bell system to check the heat earlier this am)      Therapy Documentation Precautions:  Precautions Precautions: Fall Restrictions Weight Bearing Restrictions: Yes RLE Weight Bearing: Non weight bearing Pain: Pain Assessment Pain Assessment: No/denies pain  See FIM for current functional status  Therapy/Group: Individual Therapy  Bud Face [facility] 06/15/2013, 12:20 PM

## 2013-06-15 NOTE — Progress Notes (Signed)
Subjective/Complaints: Sliding board slipped a little while she was transferring and she put her right foot out to support herself. Was tender initially but feeling much better last night and this am.   A 12 point review of systems has been performed and if not noted above is otherwise negative.   Objective: Vital Signs: Blood pressure 149/77, pulse 100, temperature 97.8 F (36.6 C), temperature source Oral, resp. rate 18, height 5\' 5"  (1.651 m), weight 74.1 kg (163 lb 5.8 oz), SpO2 98.00%. No results found.  Recent Labs  06/13/13 0500 06/14/13 0550  HGB 7.3* 7.5*  HCT 21.5* 22.1*    Recent Labs  06/13/13 0500  NA 136  K 4.3  CL 102  GLUCOSE 133*  BUN 28*  CREATININE 0.89  CALCIUM 8.6   CBG (last 3)   Recent Labs  06/14/13 1617 06/14/13 2048 06/15/13 0711  GLUCAP 136* 130* 120*    Wt Readings from Last 3 Encounters:  06/15/13 74.1 kg (163 lb 5.8 oz)  06/09/13 75 kg (165 lb 5.5 oz)  06/09/13 75 kg (165 lb 5.5 oz)    Physical Exam:  Constitutional: She appears well-developed and well-nourished.  HENT:  Head: Normocephalic and atraumatic.  Eyes: Conjunctivae and EOM are normal. Pupils are equal, round, and reactive to light.  Neck: No JVD present. No tracheal deviation present. No thyromegaly present.  Cardiovascular: Normal rate. Normal rhythm, no murmurs  Respiratory: Effort normal. Chest clear  GI: She exhibits no distension. There is no tenderness. Bowel sounds positive Musculoskeletal:  Left BKA stump well healed. Right foot dressed with coban/immediate post-op dressing. No drainage/seepage. No more tender than baseline.  Remainder of leg is warm and neurovascularly intact.  Lymphadenopathy:  She has no cervical adenopathy.  Neurological: No cranial nerve deficit. Coordination normal.  5/5 strength UE's. RUE is 3-4/5. LLE is4/5. Sensory exam grossly intact in both upper extremities.  Psychiatric: She has a normal mood and affect. Her behavior is normal.  Judgment and thought content normal with good insight and awareness   Assessment/Plan: 1. Functional deficits secondary to right midfoot amputation (prior left BKA)  which require 3+ hours per day of interdisciplinary therapy in a comprehensive inpatient rehab setting. Physiatrist is providing close team supervision and 24 hour management of active medical problems listed below. Physiatrist and rehab team continue to assess barriers to discharge/monitor patient progress toward functional and medical goals.  Remove coban dressing this week.   FIM: FIM - Bathing Bathing Steps Patient Completed: Chest;Right Arm;Left Arm;Abdomen;Left upper leg;Right upper leg;Front perineal area Bathing: 4: Min-Patient completes 8-9 22f 10 parts or 75+ percent (seated in w/c)  FIM - Upper Body Dressing/Undressing Upper body dressing/undressing steps patient completed: Thread/unthread right sleeve of pullover shirt/dresss;Thread/unthread left sleeve of pullover shirt/dress;Put head through opening of pull over shirt/dress;Pull shirt over trunk Upper body dressing/undressing: 5: Set-up assist to: Obtain clothing/put away FIM - Lower Body Dressing/Undressing Lower body dressing/undressing steps patient completed: Thread/unthread right underwear leg;Thread/unthread left underwear leg;Thread/unthread right pants leg;Thread/unthread left pants leg Lower body dressing/undressing: 3: Mod-Patient completed 50-74% of tasks  FIM - Toileting Toileting steps completed by patient: Performs perineal hygiene Toileting: 2: Max-Patient completed 1 of 3 steps  FIM - Diplomatic Services operational officer Devices: Sliding board;Bedside commode Toilet Transfers: 2-From toilet/BSC: Max A (lift and lower assist);2-To toilet/BSC: Max A (lift and lower assist)  FIM - Press photographer Assistive Devices: Prosthesis;Walker;Arm rests Bed/Chair Transfer: 2: Chair or W/C > Bed: Max A (lift and lower assist);2:  Bed > Chair or W/C: Max A (lift and lower assist)  FIM - Locomotion: Wheelchair Distance: 150 Locomotion: Wheelchair: 5: Travels 150 ft or more: maneuvers on rugs and over door sills with supervision, cueing or coaxing FIM - Locomotion: Ambulation Locomotion: Ambulation: 0: Activity did not occur  Comprehension Comprehension Mode: Auditory Comprehension: 5-Follows basic conversation/direction: With no assist  Expression Expression Mode: Verbal Expression: 5-Expresses complex 90% of the time/cues < 10% of the time  Social Interaction Social Interaction: 7-Interacts appropriately with others - No medications needed.  Problem Solving Problem Solving: 5-Solves complex 90% of the time/cues < 10% of the time  Memory Memory: 5-Recognizes or recalls 90% of the time/requires cueing < 10% of the time  Medical Problem List and Plan:  1. H/o A fib/ DVT Prophylaxis/Anticoagulation: Pharmaceutical: Coumadin  2. Neuropathy with phantom pain: continue neurontin bid and hs pamelor.  -this combo has worked and is tolerated so far.  -explained to her that i'm not using the pamelor as an antidepressant  3. Mood: pt in good spirits. Team to provide egosupport as required.  4. Neuropsych: This patient is capable of making decisions on her own behalf.  5. HTN: Will monitor with bid checks. Continue amaryl daily and use SSI for elevated BS. Metformin discontinued due to elevated Cr >=1.4 at Admission--   -follow i's and o's  -BUN better today  6. Chronic anemia with superimposed ABLA:   -hgb 7.4. May need to consider transfusing---but holding steady. Recheck Monday 7. DM type 2: sugars under control  8. H/o fluid overload: will check weights every day. Will need to stay on dry side. Lasix on hold currently.  9. COPD: Continue spiriva. Encourage IS as well as staying OOB during the day. 10. Low protein stores: continue protein supplements.   LOS (Days) 6 A FACE TO FACE EVALUATION WAS  PERFORMED  [name],[name] 06/15/2013 8:02 AM

## 2013-06-15 NOTE — Progress Notes (Signed)
ANTICOAGULATION CONSULT NOTE - Follow Up Consult  Pharmacy Consult for Warfarin Indication: atrial fibrillation  Allergies  Allergen Reactions  . Other     Narcotics=makes her very disoriented  . Percocet [Oxycodone-Acetaminophen] Other (See Comments)    Dilerium, hallucinations  . Tramadol Anxiety  . Vicodin [Hydrocodone-Acetaminophen] Anxiety    Patient Measurements: Height: 5\' 5"  (165.1 cm) Weight: 163 lb 5.8 oz (74.1 kg) IBW/kg (Calculated) : 57  Vital Signs: Temp: 97.8 F (36.6 C) (11/02 0548) Temp src: Oral (11/02 0548) BP: 149/77 mmHg (11/02 0548) Pulse Rate: 100 (11/02 0548)  Labs:  Recent Labs  06/13/13 0500 06/14/13 0550 06/15/13 0540  HGB 7.3* 7.5*  --   HCT 21.5* 22.1*  --   LABPROT 17.7* 18.4* 21.1*  INR 1.50* 1.58* 1.89*  CREATININE 0.89  --   --     Estimated Creatinine Clearance: 42.3 ml/min (by C-G formula based on Cr of 0.89).   Assessment: 79 y.o. F who continues on warfarin for hx Afib with a SUBtherapeutic INR this morning though trending back up (INR 1.89 << 1.58, goal of 2-3). Warfarin was held on 10/29 because warfarin 4 mg was charted twice as given -- it is unclear if the patient actually received a total of 8 mg. Hgb/Hct low but stable, plts wnl on 10/30. No overt s/sx of bleeding noted at this time.   Goal of Therapy:  INR 2-3   Plan:  1. Warfarin 3 mg x 1 dose at 1800 today 2. Will continue to monitor for any signs/symptoms of bleeding and will follow up with PT/INR in the a.m.   Georgina Pillion, PharmD, BCPS Clinical Pharmacist Pager: [phone] 06/15/2013 8:58 AM

## 2013-06-16 ENCOUNTER — Inpatient Hospital Stay (HOSPITAL_COMMUNITY): Payer: Medicare Other

## 2013-06-16 ENCOUNTER — Inpatient Hospital Stay (HOSPITAL_COMMUNITY): Payer: Medicare Other | Admitting: Occupational Therapy

## 2013-06-16 LAB — HEMOGLOBIN AND HEMATOCRIT, BLOOD: HCT: 24.5 % — ABNORMAL LOW (ref 36.0–46.0)

## 2013-06-16 LAB — GLUCOSE, CAPILLARY
Glucose-Capillary: 143 mg/dL — ABNORMAL HIGH (ref 70–99)
Glucose-Capillary: 193 mg/dL — ABNORMAL HIGH (ref 70–99)
Glucose-Capillary: 113 mg/dL — ABNORMAL HIGH (ref 70–99)

## 2013-06-16 MED ORDER — WARFARIN SODIUM 2 MG PO TABS
2.0000 mg | ORAL_TABLET | Freq: Once | ORAL | Status: AC
  Administered 2013-06-16: 2 mg via ORAL
  Filled 2013-06-16: qty 1

## 2013-06-16 NOTE — Progress Notes (Signed)
Physical Therapy Session Note  Patient Details  Name: Sydney [name] MRN: [id number] Date of Birth: [birth year]  Today's Date: 06/16/2013 Time: 1110-1155 Time Calculation (min): 45 min  Short Term Goals: Week 1:  PT Short Term Goal 1 (Week 1): Pt to perform sit>supine with supervision. PT Short Term Goal 2 (Week 1): Pt to perform supine>sit with mod I and no bed rail. PT Short Term Goal 3 (Week 1): Pt to demonstrate effective management of w/c parts with min cueing. PT Short Term Goal 4 (Week 1): Patient will perform lateral scooting transfer from bed<>w/c using slide board with consistent min A.  Skilled Therapeutic Interventions/Progress Updates:    Session focused on pt donning L prosthesis (extra time and A needed from therapist to get it to lock into place), w/c propulsion and parts management to set up for transfer, and slide board transfers w/c <-> mat with pt directing transfer with min A from therapist to hold RLE up off the floor in order for pt to maintain NWB precautions. Pt's daughter present during session to observe and discussed with her DME (will need to change out legrests from pt's current w/c as pt has amputee pad on L from prior and non elevating on R. Will discuss with Child psychotherapist).   Therapy Documentation Precautions:  Precautions Precautions: Fall Restrictions Weight Bearing Restrictions: Yes RLE Weight Bearing: Non weight bearing Pain:  Premedicated for pain - reports pain is minimal at this time.  See FIM for current functional status  Therapy/Group: Individual Therapy  Karolee Stamps [facility] 06/16/2013, 12:00 PM

## 2013-06-16 NOTE — Progress Notes (Signed)
Physical Therapy Session Note  Patient Details  Name: Sydney [name] MRN: [id number] Date of Birth: 1921-07-22  Today's Date: 06/16/2013 Time: 4098-1191 Time Calculation (min): 50 min  Short Term Goals: Week 1:  PT Short Term Goal 1 (Week 1): Pt to perform sit>supine with supervision. PT Short Term Goal 2 (Week 1): Pt to perform supine>sit with mod I and no bed rail. PT Short Term Goal 3 (Week 1): Pt to demonstrate effective management of w/c parts with min cueing. PT Short Term Goal 4 (Week 1): Patient will perform lateral scooting transfer from bed<>w/c using slide board with consistent min A.  Skilled Therapeutic Interventions/Progress Updates:    Patient received seated in w/c wearing prosthesis. Patient's outpatient physical therapist, Zella Ball, present for session to assist in facilitating safe prosthetic wearing schedule to promote pt independence with functional mobility while protecting skin integrity. Therapists provided pt with verbal explanation and demonstration of the following: frequency of LLE skin inspection, LLE prosthetic wearing schedule (prosthesis on for 2 consecutive hours followed by 2 hours off for initial 5-7 days; if tolerated, prosthesis on for 3 hours followed by 2 hours off); proper technique for donning/doffing prosthetic liner and device; importance of wearing shrinker at all times (including during sleep) when prosthesis is not worn; technique and frequency of cleaning prosthetic liner; and signs of poor fit, excessive heat/moisture, and skin breakdown. Patient verbalized understanding. Pt then performed lateral scoot transfer from w/c<>bed using sliding board with min A. Pt transported in w/c to gym, where pt performed sit>stand transfer from w/c to parallel bars with mod/max A. Static standing with bilat UE support at parallel bars x3 minutes, 27 seconds with pt wearing weight through R knee (supported by stool) requiring mod A support stool and supervision for standing  balance. During standing trial, pt performed cross-body reaching with RUE to facilitate LLE weight shifting. Following standing trial, pt's L prosthetic device/liner were removed to examine skin. No excessive erythema or signs of breakdown noted. Pt transported back to room, where session ended. Therapists provided CNA with verbal explanation and demonstration of proper technique for abovementioned educational material. CNA verbalized understanding and gave effective return demonstration. CNA doffed liner/device; pt donned shrinker. Therapists departed with pt seated in w/c with call bell and all needs within reach. Signs posted in pt room explaining wearing schedule and cleaning instructions for silicone sleeve. Therapy Documentation Precautions:  Precautions Precautions: Fall Restrictions Weight Bearing Restrictions: Yes RLE Weight Bearing: Non weight bearing Pain: Pain Assessment Pain Assessment: No/denies pain Pain Score: 0-No pain Locomotion : Wheelchair Mobility Distance: 150     Other Treatments:    See FIM for current functional status  Therapy/Group: Individual Therapy  Lorren Splawn, Lorenda Ishihara 06/16/2013, 3:36 PM

## 2013-06-16 NOTE — Plan of Care (Signed)
Problem: RH BOWEL ELIMINATION Goal: RH STG MANAGE BOWEL W/MEDICATION W/ASSISTANCE STG Manage Bowel with Medication with Assistance Mod I  Outcome: Progressing No incontinent of bowel

## 2013-06-16 NOTE — Plan of Care (Signed)
Problem: RH BOWEL ELIMINATION Goal: RH STG MANAGE BOWEL WITH ASSISTANCE STG Manage Bowel with Assistance mod I  Outcome: Progressing Continent of bowel and bladder

## 2013-06-16 NOTE — Plan of Care (Signed)
Problem: RH PAIN MANAGEMENT Goal: RH STG PAIN MANAGED AT OR BELOW PT'S PAIN GOAL Less than 4 Outcome: Progressing No c/o pain     

## 2013-06-16 NOTE — Progress Notes (Signed)
Subjective/Complaints: No new issues except for being cold in the room (room IS cold)  A 12 point review of systems has been performed and if not noted above is otherwise negative.   Objective: Vital Signs: Blood pressure 146/82, pulse 78, temperature 98.3 F (36.8 C), temperature source Oral, resp. rate 18, height 5\' 5"  (1.651 m), weight 74.4 kg (164 lb 0.4 oz), SpO2 98.00%. No results found.  Recent Labs  06/14/13 0550 06/16/13 0605  HGB 7.5* 8.1*  HCT 22.1* 24.5*   No results found for this basename: NA, K, CL, CO, GLUCOSE, BUN, CREATININE, CALCIUM,  in the last 72 hours CBG (last 3)   Recent Labs  06/15/13 1618 06/15/13 2100 06/16/13 0726  GLUCAP 148* 211* 143*    Wt Readings from Last 3 Encounters:  06/16/13 74.4 kg (164 lb 0.4 oz)  06/09/13 75 kg (165 lb 5.5 oz)  06/09/13 75 kg (165 lb 5.5 oz)    Physical Exam:  Constitutional: She appears well-developed and well-nourished.  HENT:  Head: Normocephalic and atraumatic.  Eyes: Conjunctivae and EOM are normal. Pupils are equal, round, and reactive to light.  Neck: No JVD present. No tracheal deviation present. No thyromegaly present.  Cardiovascular: Normal rate. Normal rhythm, no murmurs  Respiratory: Effort normal. Chest clear  GI: She exhibits no distension. There is no tenderness. Bowel sounds positive Musculoskeletal:  Left BKA stump well healed. Right foot dressed with coban/immediate post-op dressing. No drainage/seepage. No more tender than baseline.  Remainder of leg is warm and neurovascularly intact.  Lymphadenopathy:  She has no cervical adenopathy.  Neurological: No cranial nerve deficit. Coordination normal.  5/5 strength UE's. RUE is 3-4/5. LLE is4/5. Sensory exam grossly intact in both upper extremities.  Psychiatric: She has a normal mood and affect. Her behavior is normal. Judgment and thought content normal with good insight and awareness   Assessment/Plan: 1. Functional deficits secondary to  right midfoot amputation (prior left BKA)  which require 3+ hours per day of interdisciplinary therapy in a comprehensive inpatient rehab setting. Physiatrist is providing close team supervision and 24 hour management of active medical problems listed below. Physiatrist and rehab team continue to assess barriers to discharge/monitor patient progress toward functional and medical goals.  Remove coban dressing this week. Limb feeling better since Saturday.   FIM: FIM - Bathing Bathing Steps Patient Completed: Chest;Right Arm;Left Arm;Abdomen;Front perineal area;Right upper leg;Left upper leg;Right lower leg (including foot) Bathing: 4: Min-Patient completes 8-9 66f 10 parts or 75+ percent  FIM - Upper Body Dressing/Undressing Upper body dressing/undressing steps patient completed: Thread/unthread right sleeve of pullover shirt/dresss;Thread/unthread left sleeve of pullover shirt/dress;Put head through opening of pull over shirt/dress;Pull shirt over trunk Upper body dressing/undressing: 5: Set-up assist to: Obtain clothing/put away FIM - Lower Body Dressing/Undressing Lower body dressing/undressing steps patient completed: Thread/unthread right pants leg;Thread/unthread left pants leg (wore brief due to laxative bowel urge) Lower body dressing/undressing: 3: Mod-Patient completed 50-74% of tasks  FIM - Toileting Toileting steps completed by patient: Adjust clothing prior to toileting;Performs perineal hygiene Toileting: 2: Max-Patient completed 1 of 3 steps  FIM - Diplomatic Services operational officer Devices: Human resources officer Transfers: 2-To toilet/BSC: Max A (lift and lower assist);2-From toilet/BSC: Max A (lift and lower assist)  FIM - Press photographer Assistive Devices: Sliding board;Prosthesis;Arm rests Bed/Chair Transfer: 3: Chair or W/C > Bed: Mod A (lift or lower assist);3: Bed > Chair or W/C: Mod A (lift or lower assist)  FIM -  Locomotion:  Wheelchair Distance: 150 Locomotion: Wheelchair: 5: Travels 150 ft or more: maneuvers on rugs and over door sills with supervision, cueing or coaxing FIM - Locomotion: Ambulation Locomotion: Ambulation: 0: Activity did not occur  Comprehension Comprehension Mode: Auditory Comprehension: 7-Follows complex conversation/direction: With no assist  Expression Expression Mode: Verbal Expression: 7-Expresses complex ideas: With no assist  Social Interaction Social Interaction: 6-Interacts appropriately with others with medication or extra time (anti-anxiety, antidepressant).  Problem Solving Problem Solving: 5-Solves complex 90% of the time/cues < 10% of the time  Memory Memory: 6-More than reasonable amt of time  Medical Problem List and Plan:  1. H/o A fib/ DVT Prophylaxis/Anticoagulation: Pharmaceutical: Coumadin  2. Neuropathy with phantom pain: continue neurontin bid and hs pamelor.  -this combo has worked and is tolerated so far. 3. Mood: pt in good spirits. Team to provide egosupport as required.  4. Neuropsych: This patient is capable of making decisions on her own behalf.  5. HTN: Will monitor with bid checks. Continue amaryl daily and use SSI for elevated BS. Metformin discontinued due to elevated Cr >=1.4 at Admission--   -follow i's and o's  -BUN better   6. Chronic anemia with superimposed ABLA:   -hgb up to 8.1 today 7. DM type 2: sugars under control  8. H/o fluid overload: will check weights every day. Will need to stay on dry side. Lasix on hold currently.  9. COPD: Continue spiriva. Encourage IS as well as staying OOB during the day. 10. Low protein stores: continue protein supplements.   LOS (Days) 7 A FACE TO FACE EVALUATION WAS PERFORMED  Zaim Nitta T 06/16/2013 8:06 AM

## 2013-06-16 NOTE — Progress Notes (Signed)
Occupational Therapy Session Notes  Patient Details  Name: Sydney [name] MRN: [id number] Date of Birth: Feb 08, 1921  Today's Date: 06/16/2013  Short Term Goals: Week 1:  OT Short Term Goal 1 (Week 1): Patient will perform LB dressing with minimal assistance from w/c level OT Short Term Goal 2 (Week 1): Patient will perform toilet transfer with minimal assistance using DME prn OT Short Term Goal 3 (Week 1): Patient will be independent with UB dressing OT Short Term Goal 4 (Week 1): Patient will be educated on a HEP  Skilled Therapeutic Interventions/Progress Updates:   Session #1 [phone] - 39 MINUTES Individual Therapy No complaints of pain Patient found supine in bed eating breakfast with HOB raised. Patient finished breakfast while discussing d\c planning with therapist. Patient is aware of possible SNF discharge. After breakfast, patient engaged in bed mobility (supine->sit) with supervision and HOB down. Patient completed UB/LB bathing & dressing seated edge of bed. Patient prefers to lay in supine position and roll left <>right for peri care and in order to pull pants up to waist. After ADL at bed level, patient transferred edge of bed -> w/c using slide board with moderate assistance from therapist. Patient expressed concern; "not everyone knows how to use the slide board and they won't listen to me, I've got bruises on my arms from people picking me up". During transfer patient required min verbal cues in order to maintain anterior lean during transfer. Patient pleased with the transfer this am, stating "that was smooth". Patient sat in front of sink for grooming tasks of washing hands, brushing teeth, combing hair, and putting on makeup. At end of session, left patient seated in w/c with leg rests donned and all needed items within reach. Therapist also printed some word search puzzles for patient to work on in between therapies.   Session #2 [id number] - 45 Minutes Individual Therapy No  complaints of pain Upon entering room, patient found on [facility] with nursing present. Patient engaged in lateral leans in order to complete peri care with nursing. Therapist took over from here, recommended patient attempt to use prosthesis for session. Patient doffed pants to donn new ones with wider leg holes. Patient then donned prosthetic sleeve and prosthesis with minimal assistance. Patient threaded pants and performed lateral leans in order to pull underwear and pants up to waist. Patient required assistance for pulling up pants, but was weightbearing through LLE with prosthesis. Patient transferred from [facility] -> w/c using slide board and prosthesis with minimal assistance. Patient propelled self from room -> therapy gym for therapeutic exercise focusing on slide board transfers using prosthesis, directing care prn for slide board transfers, and BUE strengthening exercises using yoga blocks (attempted with pushup blocks first, but this was too difficult for patient). Patient was able to transfer therapy mat -> w/c with close supervision at end of session. Patient propelled self back to room and therapist left patient seated in w/c beside bed with all necessary items within reach.   Precautions:  Precautions Precautions: Fall Restrictions Weight Bearing Restrictions: Yes RLE Weight Bearing: Non weight bearing  See FIM for current functional status  Sydney [name] 06/16/2013, 7:18 AM

## 2013-06-16 NOTE — Progress Notes (Signed)
ANTICOAGULATION CONSULT NOTE - Follow Up Consult  Pharmacy Consult for coumadin Indication: atrial fibrillation  Allergies  Allergen Reactions  . Other     Narcotics=makes her very disoriented  . Percocet [Oxycodone-Acetaminophen] Other (See Comments)    Dilerium, hallucinations  . Tramadol Anxiety  . Vicodin [Hydrocodone-Acetaminophen] Anxiety    Patient Measurements: Height: 5\' 5"  (165.1 cm) Weight: 164 lb 0.4 oz (74.4 kg) IBW/kg (Calculated) : 57 Heparin Dosing Weight:   Vital Signs: Temp: 98.3 F (36.8 C) (11/03 0504) Temp src: Oral (11/03 0504) BP: 146/82 mmHg (11/03 0504) Pulse Rate: 78 (11/03 0504)  Labs:  Recent Labs  06/14/13 0550 06/15/13 0540 06/16/13 0605  HGB 7.5*  --  8.1*  HCT 22.1*  --  24.5*  LABPROT 18.4* 21.1* 22.9*  INR 1.58* 1.89* 2.10*    Estimated Creatinine Clearance: 42.4 ml/min (by C-G formula based on Cr of 0.89).   Medications:  Scheduled:  . folic acid  1 mg Oral Daily  . gabapentin  300 mg Oral BID  . glimepiride  2 mg Oral QAC breakfast  . insulin aspart  0-9 Units Subcutaneous TID WC  . iron polysaccharides  150 mg Oral BID AC  . levothyroxine  50 mcg Oral QAC breakfast  . nortriptyline  10 mg Oral QHS  . polyethylene glycol  17 g Oral Daily  . protein supplement  1 scoop Oral BID WC  . senna-docusate  1 tablet Oral q morning - 10a  . tiotropium  18 mcg Inhalation Q breakfast  . Warfarin - Pharmacist Dosing Inpatient   Does not apply q1800   Infusions:    Assessment: 79 yo female with hx of afib is currently on therapeutic coumadin>  INR up to 2.1 today.  Was on coumadin 4mg  daily except 2 mg MWF prior to admission with therapeutic INR Goal of Therapy:  INR 2-3 Monitor platelets by anticoagulation protocol: Yes   Plan:  1) Coumadin 2mg  po x1 2) INR in am  Karyl Sharrar, Tsz-Yin 06/16/2013,8:35 AM

## 2013-06-17 ENCOUNTER — Inpatient Hospital Stay (HOSPITAL_COMMUNITY): Payer: Medicare Other

## 2013-06-17 ENCOUNTER — Inpatient Hospital Stay (HOSPITAL_COMMUNITY): Payer: Medicare Other | Admitting: Occupational Therapy

## 2013-06-17 DIAGNOSIS — S88119A Complete traumatic amputation at level between knee and ankle, unspecified lower leg, initial encounter: Secondary | ICD-10-CM

## 2013-06-17 DIAGNOSIS — S98919A Complete traumatic amputation of unspecified foot, level unspecified, initial encounter: Secondary | ICD-10-CM

## 2013-06-17 DIAGNOSIS — I739 Peripheral vascular disease, unspecified: Secondary | ICD-10-CM

## 2013-06-17 DIAGNOSIS — L98499 Non-pressure chronic ulcer of skin of other sites with unspecified severity: Secondary | ICD-10-CM

## 2013-06-17 DIAGNOSIS — D62 Acute posthemorrhagic anemia: Secondary | ICD-10-CM

## 2013-06-17 LAB — PROTIME-INR
INR: 2.27 — ABNORMAL HIGH (ref 0.00–1.49)
Prothrombin Time: 24.3 seconds — ABNORMAL HIGH (ref 11.6–15.2)

## 2013-06-17 LAB — GLUCOSE, CAPILLARY
Glucose-Capillary: 181 mg/dL — ABNORMAL HIGH (ref 70–99)
Glucose-Capillary: 183 mg/dL — ABNORMAL HIGH (ref 70–99)

## 2013-06-17 MED ORDER — WARFARIN SODIUM 4 MG PO TABS
4.0000 mg | ORAL_TABLET | Freq: Once | ORAL | Status: AC
  Administered 2013-06-17: 4 mg via ORAL
  Filled 2013-06-17: qty 1

## 2013-06-17 NOTE — Progress Notes (Signed)
Physical Therapy Session Note  Patient Details  Name: Sydney [name] MRN: [id number] Date of Birth: 06-27-1921  Today's Date: 06/17/2013 Time: 1330-1400 Time Calculation (min): 30 min  Short Term Goals: Week 1:  PT Short Term Goal 1 (Week 1): Pt to perform sit>supine with supervision. PT Short Term Goal 2 (Week 1): Pt to perform supine>sit with mod I and no bed rail. PT Short Term Goal 3 (Week 1): Pt to demonstrate effective management of w/c parts with min cueing. PT Short Term Goal 4 (Week 1): Patient will perform lateral scooting transfer from bed<>w/c using slide board with consistent min A.  Skilled Therapeutic Interventions/Progress Updates:    Pt reports pain is no longer present on L residual limb and has left prosthesis off since morning session. Focused on pt donning prosthesis (required A for alignment of pin after second try) without any increase in pain or discomfort. Slideboard transfer to Nustep with mod A for uphill portion and focused on functional strengthening and endurance of UEs and LLE with prosthesis (RLE propped off to side as NWB) x 10 min on level 2. Pt with no complaints of discomfort on prosthesis and really enjoyed exercise. Next therapy session in 15 min and pt left with tech in the gym on Nustep per pt request and next therapist notified and aware.  Therapy Documentation Precautions:  Precautions Precautions: Fall Restrictions Weight Bearing Restrictions: Yes RLE Weight Bearing: Non weight bearing (to R foot)  Pain: Denies pain.  See FIM for current functional status  Therapy/Group: Individual Therapy  Karolee Stamps [facility] 06/17/2013, 2:03 PM

## 2013-06-17 NOTE — Progress Notes (Signed)
INITIAL NUTRITION ASSESSMENT  DOCUMENTATION CODES Per approved criteria  -Not Applicable   INTERVENTION: Continue Beneprotein, 1 scoop BID. Encouraged protein-rich foods. RD to continue to follow nutrition care plan.  NUTRITION DIAGNOSIS: Increased nutrient needs related to post-op and wound healing as evidenced by estimated needs.   Goal: Intake to meet >90% of estimated nutrition needs.  Monitor:  weight trends, lab trends, I/O's, PO intake, supplement tolerance  Reason for Assessment: Malnutrition Screening Tool  79 y.o. female  Admitting Dx: Status post transmetatarsal amputation of right foot  ASSESSMENT: PMHx significant for COPD, CHF, DM. Admitted s/p R midfoot amputation and L-BKA.   Currently ordered for Carbohydrate Modified Medium diet. Eating 75-100% of meals. Ordered for Beneprotein 1 scoop BID. Pt reports that she is eating well and eats balanced meals at home. She takes the Beneprotein at home as well. We discussed high protein foods to help with her wound healing. Pt verbalized understanding.  Height: Ht Readings from Last 1 Encounters:  06/09/13 5\' 5"  (1.651 m)    Weight: Wt Readings from Last 1 Encounters:  06/17/13 160 lb 0.9 oz (72.6 kg)    Ideal Body Weight: 115 lb (adjusted for BKA + foot amputation)  % Ideal Body Weight: 139%  Wt Readings from Last 10 Encounters:  06/17/13 160 lb 0.9 oz (72.6 kg)  06/09/13 165 lb 5.5 oz (75 kg)  06/09/13 165 lb 5.5 oz (75 kg)  07/24/12 130 lb (58.968 kg)  07/03/12 130 lb 14.4 oz (59.376 kg)  06/23/12 131 lb 13.4 oz (59.8 kg)  06/23/12 131 lb 13.4 oz (59.8 kg)  05/31/12 131 lb 13.4 oz (59.8 kg)  05/31/12 131 lb 13.4 oz (59.8 kg)  05/10/12 139 lb (63.05 kg)    Usual Body Weight: 130 lb  % Usual Body Weight: 123%  BMI:  29.1 (using adjusted body for amputations) - overweight  Estimated Nutritional Needs: Kcal: 1500 - 1700 Protein: 72 - 85 g Fluid: 1.5 - 1.7 liters  Skin:  unstageable pressure  ulcer to R heel R foot incision L leg incision  Diet Order: Carb Control Medium  EDUCATION NEEDS: -No education needs identified at this time   Intake/Output Summary (Last 24 hours) at 06/17/13 1026 Last data filed at 06/17/13 0849  Gross per 24 hour  Intake    720 ml  Output      0 ml  Net    720 ml    Last BM: 11/3  Labs:   Recent Labs Lab 06/12/13 0533 06/13/13 0500  NA 135 136  K 4.2 4.3  CL 101 102  CO2 24 25  BUN 31* 28*  CREATININE 0.93 0.89  CALCIUM 8.6 8.6  GLUCOSE 114* 133*    CBG (last 3)   Recent Labs  06/16/13 1657 06/16/13 2045 06/17/13 0719  GLUCAP 113* 214* 151*    Scheduled Meds: . folic acid  1 mg Oral Daily  . gabapentin  300 mg Oral BID  . glimepiride  2 mg Oral QAC breakfast  . insulin aspart  0-9 Units Subcutaneous TID WC  . iron polysaccharides  150 mg Oral BID AC  . levothyroxine  50 mcg Oral QAC breakfast  . nortriptyline  10 mg Oral QHS  . polyethylene glycol  17 g Oral Daily  . protein supplement  1 scoop Oral BID WC  . senna-docusate  1 tablet Oral q morning - 10a  . tiotropium  18 mcg Inhalation Q breakfast  . warfarin  4 mg Oral ONCE-1800  .  Warfarin - Pharmacist Dosing Inpatient   Does not apply q1800    Continuous Infusions:   Past Medical History  Diagnosis Date  . Lung disease     BRONCHOSPASTIC  . Neuropathy     takes Gabapentin bid  . Hypercholesterolemia     doesn't require meds for this  . Anemia   . COPD (chronic obstructive pulmonary disease)   . Blood transfusion ~ 2012    "once; not related to a surgery or bleeding that I remember" (06/06/2013)  . Hypertension     takes Prinizide daily  . Chronic atrial fibrillation     takes Coumadin as instructed  . Peripheral vascular disease   . CHF (congestive heart failure)     takes Lasix every other day  . Emphysema   . History of head injury     many yrs ago  . Bruises easily   . History of bladder infections   . Vaginal discharge     has seen  GYN but says its nothing to worry about  . Diabetes mellitus     takes Metformin and Amaryl daily  . Hypothyroidism     takes Synthroid daily  . Macular degeneration     dry  . Bronchial pneumonia     "once; several years ago" (06/06/2013)  . Shortness of breath     "@ rest; use spiriva qd" (06/06/2013)  . Rheumatoid arthritis     "shoulders; daughter gives me shots of Methotrexate q week" (06/06/2013)    Past Surgical History  Procedure Laterality Date  . Total hip arthroplasty Left ?2013  . Back surgery    . Fracture surgery  2011    LEFT HIP FX /REPLACEMENT   . Cholecystectomy  1951  . Dilation and curettage of uterus    . Amputation  11/09/2011    Procedure: AMPUTATION DIGIT;  Surgeon: Kathryne Hitch, MD;  Location: [facility] OR;  Service: Orthopedics;  Laterality: Right;  Right Great Toe Amputation  . Amputation  01/05/2012    Procedure: AMPUTATION RAY;  Surgeon: Kathryne Hitch, MD;  Location: [facility] ORS;  Service: Orthopedics;  Laterality: Left;  Amputation Left 4th and 5th Toes  . Cardioversion    . Amputation  05/31/2012    Procedure: AMPUTATION RAY;  Surgeon: Nadara Mustard, MD;  Location: [facility] OR;  Service: Orthopedics;  Laterality: Left;  Left foot 1st ray amp  . Amputation  06/21/2012    Procedure: AMPUTATION BELOW KNEE;  Surgeon: Nadara Mustard, MD;  Location: [facility] OR;  Service: Orthopedics;  Laterality: Left;  Left Below Knee Amputation  . Foot amputation through metatarsal Right 06/06/2013    midfoot/notes 06/06/2013  . Joint replacement    . Appendectomy    . Total abdominal hysterectomy  1975  . Cervical disc surgery      "the top vertebrae" (06/06/2013)  . Cataract extraction w/ intraocular lens  implant, bilateral Bilateral   . Eye surgery      bilateral -cataract surgery  . Amputation Right 06/06/2013    Procedure: RIGHT MIDFOOT AMPUTATION;  Surgeon: Nadara Mustard, MD;  Location: [facility] OR;  Service: Orthopedics;  Laterality: Right;    Jarold Motto MS, RD,  LDN Pager: [phone] After-hours pager: [phone]

## 2013-06-17 NOTE — Progress Notes (Signed)
Subjective/Complaints: No complaints. Slept soundly A 12 point review of systems has been performed and if not noted above is otherwise negative.   Objective: Vital Signs: Blood pressure 170/82, pulse 97, temperature 97.9 F (36.6 C), temperature source Oral, resp. rate 18, height 5\' 5"  (1.651 m), weight 72.6 kg (160 lb 0.9 oz), SpO2 95.00%. No results found.  Recent Labs  06/16/13 0605  HGB 8.1*  HCT 24.5*   No results found for this basename: NA, K, CL, CO, GLUCOSE, BUN, CREATININE, CALCIUM,  in the last 72 hours CBG (last 3)   Recent Labs  06/16/13 1657 06/16/13 2045 06/17/13 0719  GLUCAP 113* 214* 151*    Wt Readings from Last 3 Encounters:  06/17/13 72.6 kg (160 lb 0.9 oz)  06/09/13 75 kg (165 lb 5.5 oz)  06/09/13 75 kg (165 lb 5.5 oz)    Physical Exam:  Constitutional: She appears well-developed and well-nourished.  HENT:  Head: Normocephalic and atraumatic.  Eyes: Conjunctivae and EOM are normal. Pupils are equal, round, and reactive to light.  Neck: No JVD present. No tracheal deviation present. No thyromegaly present.  Cardiovascular: Normal rate. Normal rhythm, no murmurs  Respiratory: Effort normal. Chest clear  GI: She exhibits no distension. There is no tenderness. Bowel sounds positive Musculoskeletal:  Left BKA stump well healed. Right foot incision clean and intact with sutures. Has a quarter size area of eschar on the right heel. Non -tender. No drainage. Neurological: No cranial nerve deficit. Coordination normal.  5/5 strength UE's. RUE is 3-4/5. LLE is4/5. Sensory exam grossly intact in both upper extremities.  Psychiatric: She has a normal mood and affect. Her behavior is normal. Judgment and thought content normal with good insight and awareness   Assessment/Plan: 1. Functional deficits secondary to right midfoot amputation (prior left BKA)  which require 3+ hours per day of interdisciplinary therapy in a comprehensive inpatient rehab  setting. Physiatrist is providing close team supervision and 24 hour management of active medical problems listed below. Physiatrist and rehab team continue to assess barriers to discharge/monitor patient progress toward functional and medical goals.  Will need to float right foot given heel breakdown---unclear how long this has been present.   -prevalon boot   FIM: FIM - Bathing Bathing Steps Patient Completed: Chest;Right Arm;Left Arm;Abdomen;Right upper leg;Left upper leg Bathing: 4: Min-Patient completes 8-9 3f 10 parts or 75+ percent (nursing assisted with peri care this am)  FIM - Upper Body Dressing/Undressing Upper body dressing/undressing steps patient completed: Thread/unthread right sleeve of pullover shirt/dresss;Thread/unthread left sleeve of pullover shirt/dress;Put head through opening of pull over shirt/dress;Pull shirt over trunk Upper body dressing/undressing: 5: Set-up assist to: Obtain clothing/put away FIM - Lower Body Dressing/Undressing Lower body dressing/undressing steps patient completed: Thread/unthread right pants leg;Thread/unthread left pants leg;Pull pants up/down Lower body dressing/undressing: 5: Supervision: Safety issues/verbal cues (bed level)  FIM - Toileting Toileting steps completed by patient: Adjust clothing prior to toileting;Performs perineal hygiene Toileting: 1: Total-Patient completed zero steps, helper did all 3  FIM - Diplomatic Services operational officer Devices: Sliding board;Prosthesis Toilet Transfers: 4-From toilet/BSC: Min A (steadying Pt. > 75%)  FIM - Bed/Chair Transfer Bed/Chair Transfer Assistive Devices: Sliding board;Prosthesis;Arm rests;Bed rails Bed/Chair Transfer: 4: Bed > Chair or W/C: Min A (steadying Pt. > 75%);4: Chair or W/C > Bed: Min A (steadying Pt. > 75%)  FIM - Locomotion: Wheelchair Distance: 150 Locomotion: Wheelchair: 5: Travels 150 ft or more: maneuvers on rugs and over door sills with supervision,  cueing or  coaxing FIM - Locomotion: Ambulation Locomotion: Ambulation: 0: Activity did not occur  Comprehension Comprehension Mode: Auditory Comprehension: 6-Follows complex conversation/direction: With extra time/assistive device  Expression Expression Mode: Verbal Expression: 6-Expresses complex ideas: With extra time/assistive device  Social Interaction Social Interaction: 7-Interacts appropriately with others - No medications needed.  Problem Solving Problem Solving: 5-Solves complex 90% of the time/cues < 10% of the time  Memory Memory: 6-More than reasonable amt of time  Medical Problem List and Plan:  1. H/o A fib/ DVT Prophylaxis/Anticoagulation: Pharmaceutical: Coumadin  2. Neuropathy with phantom pain: continue neurontin bid and hs pamelor.  -this combo has worked and is tolerated so far. 3. Mood: pt in good spirits. Team to provide egosupport as required.  4. Neuropsych: This patient is capable of making decisions on her own behalf.  5. HTN: Will monitor with bid checks. Continue amaryl daily and use SSI for elevated BS. Metformin discontinued due to elevated Cr >=1.4 at Admission--   -follow i's and o's  -BUN better   6. Chronic anemia with superimposed ABLA:   -hgb up to 8.1 today 7. DM type 2: sugars under control  8. H/o fluid overload: will check weights every day. Will need to stay on dry side. Lasix on hold currently.  9. COPD: Continue spiriva. Encourage IS as well as staying OOB during the day. 10. Low protein stores: continue protein supplements.   LOS (Days) 8 A FACE TO FACE EVALUATION WAS PERFORMED  Jakaden Ouzts T 06/17/2013 7:39 AM

## 2013-06-17 NOTE — Progress Notes (Signed)
Occupational Therapy Session Note  Patient Details  Name: Sydney [name] MRN: [id number] Date of Birth: September 04, 1920  Today's Date: 06/17/2013 Time: 4098-1191 Time Calculation (min): 55 min  Short Term Goals: Week 1:  OT Short Term Goal 1 (Week 1): Patient will perform LB dressing with minimal assistance from w/c level OT Short Term Goal 2 (Week 1): Patient will perform toilet transfer with minimal assistance using DME prn OT Short Term Goal 3 (Week 1): Patient will be independent with UB dressing OT Short Term Goal 4 (Week 1): Patient will be educated on a HEP  Skilled Therapeutic Interventions/Progress Updates:  No complaints of pain Patient found seated EOB with nursing student present. When entering room, patient trying to figure out how to get her prosthetsis donned. Therapist encouraged patient to complete bathing & dressing tasks before donning prosthesis. Therapist started setting up basin for bath at bed level and patient questioned getting in w/c and performing bathing tasks at sink. Therapist explained reasoning behind completing bathing & dressing at bed side (patient with decreased overall activity tolerance/endurance and has difficulty with lateral leans seated in w/c in order to pull pants up to waist). Patient able to complete bathing & dressing tasks with more independence at bed level at this time. Patient stated she understood. Patient completed bathing & dressing tasks, seated edge of bed with supervision. From here, patient donned sleeve with set-up assist and prosthesis with minimal assistance. Patient then transferred edge of bed -> w/c using slide board and with prosthesis donned with close supervision. Patient sat at sink from here in order to complete grooming tasks of brushing teeth, washing hands, and combing hair. Patient propelled self from room -> therapy gym and left seated in w/c waiting on next therapist. Patient seemed a bit more confused this am (poor problem  solving)regarding routine and how to donn prosthesis.   Patient to doff prosthesis by 1105 following below schedule.   Patient has a prosthesis wearing schedule: 2 hours on, 2 hours off; she has a sign above her bed explaining prosthesis wearing schedule, cleaning instructions for silicone sleeve, and pertinent information regarding skin and prosthesis. Patient is aware of this information.  Precautions:  Precautions Precautions: Fall Restrictions Weight Bearing Restrictions: Yes RLE Weight Bearing: Non weight bearing  See FIM for current functional status  Therapy/Group: Individual Therapy  Deanta Mincey 06/17/2013, 9:31 AM

## 2013-06-17 NOTE — Progress Notes (Signed)
Physical Therapy Session Note  Patient Details  Name: Sydney [name] MRN: [id number] Date of Birth: [birth year]  Today's Date: 06/17/2013 Time: 4098-1191 Time Calculation (min): 56 min  Short Term Goals: Week 1:  PT Short Term Goal 1 (Week 1): Pt to perform sit>supine with supervision. PT Short Term Goal 2 (Week 1): Pt to perform supine>sit with mod I and no bed rail. PT Short Term Goal 3 (Week 1): Pt to demonstrate effective management of w/c parts with min cueing. PT Short Term Goal 4 (Week 1): Patient will perform lateral scooting transfer from bed<>w/c using slide board with consistent min A.  Skilled Therapeutic Interventions/Progress Updates:    Patient received seated in w/c in gym having just finished OT session. Pt agreeable to session. Pt reports compliance with wearing schedule, shrinker, and cleaning of prosthetic liner. Sit>stand from w/c to parallel bars with max A x2 attempts; successfully achieved standing on second trial with max verbal cueing and manual facilitation for anterior weight shifting. Standing trial performed x5 minutes total with bilat UE support (dynamic standing with single UE support x2 minutes) with min/modA at posterior hips to maintain bilat hip extension. Self-propulsion of w/c via bilat UE's x50' with supervision. Pt performed setup for car transfer with mod verbal cues for removal of w/c arm rest. Once set up, pt reports tenderness in anterior aspect of L tibia. Pt removed prosthetic device with min A, prosthetic liner with supervision. Observation of skin reveals no notable erythema or signs of skin breakdown. Therapists instructed pt to remove prosthetic and liner for 2 hours and monitor tenderness. Returned to pt room via self-propulsion of w/c via bilat UE's x150' with supervision. Therapists departed room with pt seated in w/c with LLE shrinker on, call bell and all needs within reach.  Therapy Documentation Precautions:  Precautions Precautions:  Fall Restrictions Weight Bearing Restrictions: Yes RLE Weight Bearing: Non weight bearing Vital Signs: Therapy Vitals Temp: 98.3 F (36.8 C) Temp src: Oral Pulse Rate: 100 Resp: 18 BP: 166/82 mmHg Patient Position, if appropriate: Sitting Oxygen Therapy SpO2: 96 % O2 Device: None (Room air) Pain: Pain Assessment Pain Assessment: 0-10 Pain Score: 4  Pain Type: Acute pain Pain Location: Leg Pain Orientation: Left Pain Descriptors / Indicators: Tender Pain Onset: With Activity Patients Stated Pain Goal: 0 Pain Intervention(s): Rest;Other (Comment) (removed L prosthesis) Multiple Pain Sites: No  See FIM for current functional status  Therapy/Group: Individual Therapy  Adam Demary, Lorenda Ishihara 06/17/2013, 10:48 AM

## 2013-06-17 NOTE — Progress Notes (Signed)
ANTICOAGULATION CONSULT NOTE - Follow Up Consult  Pharmacy Consult for coumadin Indication: atrial fibrillation  Allergies  Allergen Reactions  . Other     Narcotics=makes her very disoriented  . Percocet [Oxycodone-Acetaminophen] Other (See Comments)    Dilerium, hallucinations  . Tramadol Anxiety  . Vicodin [Hydrocodone-Acetaminophen] Anxiety    Patient Measurements: Height: 5\' 5"  (165.1 cm) Weight: 160 lb 0.9 oz (72.6 kg) IBW/kg (Calculated) : 57 Heparin Dosing Weight:   Vital Signs: Temp: 97.9 F (36.6 C) (11/04 0537) Temp src: Oral (11/04 0537) BP: 170/82 mmHg (11/04 0537) Pulse Rate: 97 (11/04 0537)  Labs:  Recent Labs  06/15/13 0540 06/16/13 0605 06/17/13 0500  HGB  --  8.1*  --   HCT  --  24.5*  --   LABPROT 21.1* 22.9* 24.3*  INR 1.89* 2.10* 2.27*    Estimated Creatinine Clearance: 41.9 ml/min (by C-G formula based on Cr of 0.89).   Medications:  Scheduled:  . folic acid  1 mg Oral Daily  . gabapentin  300 mg Oral BID  . glimepiride  2 mg Oral QAC breakfast  . insulin aspart  0-9 Units Subcutaneous TID WC  . iron polysaccharides  150 mg Oral BID AC  . levothyroxine  50 mcg Oral QAC breakfast  . nortriptyline  10 mg Oral QHS  . polyethylene glycol  17 g Oral Daily  . protein supplement  1 scoop Oral BID WC  . senna-docusate  1 tablet Oral q morning - 10a  . tiotropium  18 mcg Inhalation Q breakfast  . Warfarin - Pharmacist Dosing Inpatient   Does not apply q1800   Infusions:    Assessment: 79 yo female with hx of afib is currently on therapeutic coumadin.  INR today is 2.27.   Goal of Therapy:  INR 2-3 Monitor platelets by anticoagulation protocol: Yes   Plan:  1) Coumadin 4mg  po x1 2) INR in am  Mate Alegria, Tsz-Yin 06/17/2013,8:15 AM

## 2013-06-17 NOTE — Progress Notes (Signed)
Occupational Therapy Session Note  Patient Details  Name: Sydney [name] MRN: [id number] Date of Birth: 09/03/1920  Today's Date: 06/17/2013 Time: 4098-1191 Time Calculation (min): 42 min  Short Term Goals: Week 1:  OT Short Term Goal 1 (Week 1): Patient will perform LB dressing with minimal assistance from w/c level OT Short Term Goal 2 (Week 1): Patient will perform toilet transfer with minimal assistance using DME prn OT Short Term Goal 3 (Week 1): Patient will be independent with UB dressing OT Short Term Goal 4 (Week 1): Patient will be educated on a HEP  Skilled Therapeutic Interventions: Therapeutic activities with emphasis on sliding board transfers and trial use of powerwheelchair (PWC).   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  Patient reports that she was unfamiliar with operating PWC but last drove her car 1 year ago.   Patient completed slide board transfer from [employer] to w/c and from w/c <> pwc with min assist, improving to only verbal cues needed to extract board after her last transfer from [facility].   Patient completed PWC mobility with 1:1 initial instruction and only vc to problem-solve while operating device, turning left, right, backing up, U-turn and negotiating around obstacles and though congested areas.  Patient verbalized confidence in her skills, both transfers and PWC use, and stated she may pursue use of PWC while at her church.    Therapy     Precautions:  Precautions Precautions: Fall Restrictions Weight Bearing Restrictions: Yes RLE Weight Bearing: Non weight bearing (to R foot)  Pain: Pain Assessment Pain Assessment: No/denies pain  See FIM for current functional status  Therapy/Group: Individual Therapy  Miia Blanks 06/17/2013, 3:05 PM

## 2013-06-17 NOTE — Progress Notes (Signed)
Physical Therapy Weekly Progress Note  Patient Details  Name: Sydney [name] MRN: [id number] Date of Birth: April 28, 1921  Today's Date: 06/17/2013  Patient has met 2 of 4 short term goals.  Pt did not meet the mod I bed mobility goal, as she requires cues at time for technique to maintain NWB on RLE but is approaching mod I. For w/c parts management, pt able to verbalize need for parts management but has difficulty with the weight of the leg rests and fine motor and requires A at this time. Goals for transfers are S (min A car) with slideboard and anticipate pt will require A with set up of leg rest management and at times SB though she is making progress with placing and removing SB during transfers. Prosthetic training and a wear schedule has been initiated with recommendations from pt's prior outpatient PT Vladimir Faster, PT) for LLE prosthesis. Pt is currently tolerating wear schedule for 2 hours on and off and will continue to monitor throughout the week. Family working on recommended 24/7 supervision needed and d/c date has been moved up by team to 11/8 pending available S at home.   Patient continues to demonstrate the following deficits: decreased strength, decreased endurance, decreased functional mobility, WB restrictions, decreased functional use of L prosthesis and therefore will continue to benefit from skilled PT intervention to enhance overall performance with activity tolerance, balance, ability to compensate for deficits, functional use of  LLE prosthesis and knowledge of precautions.  Patient progressing toward long term goals..  Continue plan of care.  PT Short Term Goals Week 1:  PT Short Term Goal 1 (Week 1): Pt to perform sit>supine with supervision. PT Short Term Goal 1 - Progress (Week 1): Met PT Short Term Goal 2 (Week 1): Pt to perform supine>sit with mod I and no bed rail. PT Short Term Goal 2 - Progress (Week 1): Partly met PT Short Term Goal 3 (Week 1): Pt to demonstrate  effective management of w/c parts with min cueing. PT Short Term Goal 3 - Progress (Week 1): Partly met PT Short Term Goal 4 (Week 1): Patient will perform lateral scooting transfer from bed<>w/c using slide board with consistent min A. PT Short Term Goal 4 - Progress (Week 1): Met Week 2:  PT Short Term Goal 1 (Week 2): = LTGs  Skilled Therapeutic Interventions/Progress Updates:  Discharge planning;Functional mobility training;Patient/family education;Pain management;Neuromuscular re-education;Therapeutic Exercise;Therapeutic Activities;UE/LE Strength taining/ROM;Wheelchair propulsion/positioning;Balance/vestibular training;DME/adaptive equipment instruction;Skin care/wound management;Splinting/orthotics   Therapy Documentation Precautions:  Precautions Precautions: Fall Restrictions Weight Bearing Restrictions: Yes RLE Weight Bearing: Non weight bearing (to R foot)   Tedd Sias 06/17/2013, 3:21 PM

## 2013-06-17 NOTE — Plan of Care (Signed)
Problem: RH SAFETY Goal: RH STG ADHERE TO SAFETY PRECAUTIONS W/ASSISTANCE/DEVICE STG Adhere to Safety Precautions With Assistance/Device. Supervision  Outcome: Progressing Calls appropriately.     

## 2013-06-18 ENCOUNTER — Encounter (HOSPITAL_COMMUNITY): Payer: Self-pay | Admitting: Cardiology

## 2013-06-18 ENCOUNTER — Inpatient Hospital Stay (HOSPITAL_COMMUNITY): Payer: Medicare Other

## 2013-06-18 ENCOUNTER — Inpatient Hospital Stay (HOSPITAL_COMMUNITY): Payer: Medicare Other | Admitting: Occupational Therapy

## 2013-06-18 DIAGNOSIS — I4891 Unspecified atrial fibrillation: Secondary | ICD-10-CM

## 2013-06-18 DIAGNOSIS — I509 Heart failure, unspecified: Secondary | ICD-10-CM

## 2013-06-18 DIAGNOSIS — I5031 Acute diastolic (congestive) heart failure: Secondary | ICD-10-CM

## 2013-06-18 DIAGNOSIS — J90 Pleural effusion, not elsewhere classified: Secondary | ICD-10-CM | POA: Diagnosis not present

## 2013-06-18 DIAGNOSIS — D649 Anemia, unspecified: Secondary | ICD-10-CM

## 2013-06-18 DIAGNOSIS — J9819 Other pulmonary collapse: Secondary | ICD-10-CM | POA: Diagnosis not present

## 2013-06-18 LAB — PREPARE RBC (CROSSMATCH)

## 2013-06-18 LAB — BLOOD GAS, ARTERIAL
Acid-Base Excess: 0.4 mmol/L (ref 0.0–2.0)
Drawn by: 331471
FIO2: 0.21 %
Patient temperature: 98.6
pCO2 arterial: 33.2 mmHg — ABNORMAL LOW (ref 35.0–45.0)
pH, Arterial: 7.468 — ABNORMAL HIGH (ref 7.350–7.450)

## 2013-06-18 LAB — GLUCOSE, CAPILLARY
Glucose-Capillary: 172 mg/dL — ABNORMAL HIGH (ref 70–99)
Glucose-Capillary: 175 mg/dL — ABNORMAL HIGH (ref 70–99)
Glucose-Capillary: 159 mg/dL — ABNORMAL HIGH (ref 70–99)

## 2013-06-18 LAB — PROTIME-INR
INR: 2.34 — ABNORMAL HIGH (ref 0.00–1.49)
Prothrombin Time: 24.9 seconds — ABNORMAL HIGH (ref 11.6–15.2)

## 2013-06-18 LAB — CBC
HCT: 24.6 % — ABNORMAL LOW (ref 36.0–46.0)
MCHC: 32.5 g/dL (ref 30.0–36.0)
RDW: 14.5 % (ref 11.5–15.5)
WBC: 8 10*3/uL (ref 4.0–10.5)

## 2013-06-18 MED ORDER — FLUTICASONE PROPIONATE 50 MCG/ACT NA SUSP
1.0000 | Freq: Every day | NASAL | Status: DC
  Administered 2013-06-18 – 2013-06-20 (×3): 1 via NASAL
  Filled 2013-06-18: qty 16

## 2013-06-18 MED ORDER — TIOTROPIUM BROMIDE MONOHYDRATE 18 MCG IN CAPS
18.0000 ug | ORAL_CAPSULE | Freq: Every day | RESPIRATORY_TRACT | Status: DC
  Administered 2013-06-21: 18 ug via RESPIRATORY_TRACT
  Filled 2013-06-18: qty 5

## 2013-06-18 MED ORDER — FUROSEMIDE 10 MG/ML IJ SOLN
40.0000 mg | Freq: Once | INTRAMUSCULAR | Status: AC
  Administered 2013-06-18: 40 mg via INTRAVENOUS
  Filled 2013-06-18: qty 4

## 2013-06-18 MED ORDER — FUROSEMIDE 10 MG/ML IJ SOLN
20.0000 mg | Freq: Once | INTRAMUSCULAR | Status: AC
  Administered 2013-06-18: 20 mg via INTRAVENOUS
  Filled 2013-06-18: qty 2

## 2013-06-18 MED ORDER — ACETAMINOPHEN 325 MG PO TABS
650.0000 mg | ORAL_TABLET | Freq: Once | ORAL | Status: AC
  Administered 2013-06-18: 650 mg via ORAL
  Filled 2013-06-18: qty 2

## 2013-06-18 MED ORDER — IPRATROPIUM-ALBUTEROL 20-100 MCG/ACT IN AERS
1.0000 | INHALATION_SPRAY | Freq: Four times a day (QID) | RESPIRATORY_TRACT | Status: DC
  Administered 2013-06-19 – 2013-06-20 (×4): 1 via RESPIRATORY_TRACT
  Filled 2013-06-18: qty 4

## 2013-06-18 MED ORDER — WARFARIN SODIUM 2 MG PO TABS
2.0000 mg | ORAL_TABLET | Freq: Once | ORAL | Status: AC
  Administered 2013-06-18: 2 mg via ORAL
  Filled 2013-06-18: qty 1

## 2013-06-18 MED ORDER — DIPHENHYDRAMINE HCL 25 MG PO CAPS
25.0000 mg | ORAL_CAPSULE | Freq: Once | ORAL | Status: AC
  Administered 2013-06-18: 25 mg via ORAL
  Filled 2013-06-18: qty 1

## 2013-06-18 NOTE — Consult Note (Signed)
HPI: 79 year old female with past medical history of permanent atrial fibrillation and now status post right midfoot amputation for evaluation of dyspnea. Patient had her last nuclear study performed in June of 2008. This showed normal LV function with an ejection fraction of 63% and normal perfusion. Echocardiogram in August of 2011 showed normal LV function, mild left atrial enlargement, mild aortic and tricuspid regurgitation. Patient also with significant peripheral vascular disease. The patient underwent right midfoot amputation and October 24 for gangrene. She was subsequently transferred to rehabilitation where she was progressing well. However last night she complained of dyspnea. There is no orthopnea, PND, palpitations, chest pain or syncope. She was placed on oxygen with improvement and she is now asymptomatic. There was documentation that she had decreased oxygen saturations during therapy. A chest x-ray shows bilateral pleural effusions. She was also transfused today. Cardiology is now asked to evaluate.  Medications Prior to Admission  Medication Sig Dispense Refill  . acetaminophen (TYLENOL) 325 MG tablet Take 1-2 tablets (325-650 mg total) by mouth every 4 (four) hours as needed.      . calcium carbonate (OS-CAL) 600 MG TABS Take 600 mg by mouth daily.      . Calcium-Vitamin D-Vitamin K ([employer] CALCIUM SOFT CHEWS PO) Take 2 tablets by mouth daily.      . Cholecalciferol (VITAMIN D3) 400 UNITS CAPS Take 400 Units by mouth daily.       . cyanocobalamin 500 MCG tablet Take 500 mcg by mouth daily.      Marland Kitchen doxycycline (VIBRAMYCIN) 100 MG capsule Take 100 mg by mouth 2 (two) times daily.       . ferrous fumarate-iron polysaccharide complex (TANDEM) 162-115.2 MG CAPS Take 1 capsule by mouth daily with breakfast.  60 capsule  1  . fish oil-omega-3 fatty acids 1000 MG capsule Take 1 g by mouth daily.      . folic acid (FOLVITE) 1 MG tablet Take 1 mg by mouth daily.      . furosemide (LASIX)  20 MG tablet Take 20 mg by mouth daily.      Marland Kitchen gabapentin (NEURONTIN) 300 MG capsule Take 300 mg by mouth 2 (two) times daily.      Marland Kitchen glimepiride (AMARYL) 4 MG tablet Take 2 mg by mouth daily before breakfast.       . levothyroxine (SYNTHROID, LEVOTHROID) 50 MCG tablet Take 50 mcg by mouth daily with breakfast.       . lisinopril-hydrochlorothiazide (PRINZIDE,ZESTORETIC) 20-25 MG per tablet Take 1 tablet by mouth daily with breakfast.       . metFORMIN (GLUCOPHAGE) 500 MG tablet Take 500 mg by mouth at bedtime.       . methocarbamol (ROBAXIN) 500 MG tablet Take 1 tablet (500 mg total) by mouth every 6 (six) hours as needed.  45 tablet  1  . METHOTREXATE SODIUM IJ Inject 0.4 mLs as directed once a week. On tuesday      . Multiple Vitamin (MULITIVITAMIN WITH MINERALS) TABS Take 1 tablet by mouth daily.      . Multiple Vitamins-Minerals (ICAPS AREDS FORMULA PO) Take 1 capsule by mouth daily with breakfast.      . nitroGLYCERIN (NITRODUR - DOSED IN MG/24 HR) 0.2 mg/hr patch Place 1 patch onto the skin at bedtime. On the foot      . oxyCODONE-acetaminophen (ROXICET) 5-325 MG per tablet Take 1 tablet by mouth every 4 (four) hours as needed for pain.  60 tablet  0  .  protein supplement (RESOURCE BENEPROTEIN) POWD Take 1 scoop by mouth 2 (two) times daily with a meal.      . tiotropium (SPIRIVA) 18 MCG inhalation capsule Place 18 mcg into inhaler and inhale daily with breakfast.       . vitamin C (ASCORBIC ACID) 500 MG tablet Take 500 mg by mouth daily.      Marland Kitchen warfarin (COUMADIN) 4 MG tablet Take 2-4 mg by mouth See admin instructions. Take 2mg  on Monday ,wednesday, Friday.  Take 4mg  on Tuesday, Thursday, Saturday, sunday        Allergies  Allergen Reactions  . Other     Narcotics=makes her very disoriented  . Percocet [Oxycodone-Acetaminophen] Other (See Comments)    Dilerium, hallucinations  . Tramadol Anxiety  . Vicodin [Hydrocodone-Acetaminophen] Anxiety    Past Medical History  Diagnosis  Date  . Lung disease     BRONCHOSPASTIC  . Neuropathy     takes Gabapentin bid  . Hypercholesterolemia     doesn't require meds for this  . Anemia   . COPD (chronic obstructive pulmonary disease)   . Blood transfusion ~ 2012    "once; not related to a surgery or bleeding that I remember" (06/06/2013)  . Hypertension     takes Prinizide daily  . Chronic atrial fibrillation     takes Coumadin as instructed  . Peripheral vascular disease   . CHF (congestive heart failure)     takes Lasix every other day  . Emphysema   . History of head injury     many yrs ago  . Vaginal discharge     has seen GYN but says its nothing to worry about  . Diabetes mellitus     takes Metformin and Amaryl daily  . Hypothyroidism     takes Synthroid daily  . Macular degeneration     dry  . Bronchial pneumonia     "once; several years ago" (06/06/2013)  . Rheumatoid arthritis     "shoulders; daughter gives me shots of Methotrexate q week" (06/06/2013)    Past Surgical History  Procedure Laterality Date  . Total hip arthroplasty Left ?2013  . Back surgery    . Fracture surgery  2011    LEFT HIP FX /REPLACEMENT   . Cholecystectomy  1951  . Dilation and curettage of uterus    . Amputation  11/09/2011    Procedure: AMPUTATION DIGIT;  Surgeon: Kathryne Hitch, MD;  Location: [facility] OR;  Service: Orthopedics;  Laterality: Right;  Right Great Toe Amputation  . Amputation  01/05/2012    Procedure: AMPUTATION RAY;  Surgeon: Kathryne Hitch, MD;  Location: [facility] ORS;  Service: Orthopedics;  Laterality: Left;  Amputation Left 4th and 5th Toes  . Cardioversion    . Amputation  05/31/2012    Procedure: AMPUTATION RAY;  Surgeon: Nadara Mustard, MD;  Location: [facility] OR;  Service: Orthopedics;  Laterality: Left;  Left foot 1st ray amp  . Amputation  06/21/2012    Procedure: AMPUTATION BELOW KNEE;  Surgeon: Nadara Mustard, MD;  Location: [facility] OR;  Service: Orthopedics;  Laterality: Left;  Left Below Knee  Amputation  . Foot amputation through metatarsal Right 06/06/2013    midfoot/notes 06/06/2013  . Joint replacement    . Appendectomy    . Total abdominal hysterectomy  1975  . Cervical disc surgery      "the top vertebrae" (06/06/2013)  . Cataract extraction w/ intraocular lens  implant, bilateral Bilateral   . Eye  surgery      bilateral -cataract surgery  . Amputation Right 06/06/2013    Procedure: RIGHT MIDFOOT AMPUTATION;  Surgeon: Nadara Mustard, MD;  Location: [facility] OR;  Service: Orthopedics;  Laterality: Right;    History   Social History  . Marital Status: Widowed    Spouse Name: N/A    Number of Children: N/A  . Years of Education: N/A   Occupational History  . Not on file.   Social History Main Topics  . Smoking status: Former Smoker -- 4 years    Types: Cigarettes  . Smokeless tobacco: Never Used     Comment: 06/06/2013 "smoked a little in my teens"  . Alcohol Use: No  . Drug Use: No  . Sexual Activity: No   Other Topics Concern  . Not on file   Social History Narrative  . No narrative on file    Family History  Problem Relation Age of Onset  . Heart disease Mother   . Stroke Mother   . Heart disease Father   . Stroke Father   . Heart attack Father   . Muscular dystrophy Brother   . Anesthesia problems Neg Hx   . Hypotension Neg Hx   . Malignant hyperthermia Neg Hx   . Pseudochol deficiency Neg Hx     ROS:  no fevers or chills, productive cough, hemoptysis, dysphasia, odynophagia, melena, hematochezia, dysuria, hematuria, rash, seizure activity, orthopnea, PND, claudication. Remaining systems are negative.  Physical Exam:   Blood pressure 147/79, pulse 84, temperature 98 F (36.7 C), temperature source Oral, resp. rate 17, height 5\' 5"  (1.651 m), weight 164 lb 6.4 oz (74.571 kg), SpO2 100.00%.  General:  Well developed/well nourished in NAD Skin warm/dry Patient not depressed No peripheral clubbing Back-normal HEENT-normal/normal eyelids Neck  supple/normal carotid upstroke bilaterally; no bruits; no JVD; no thyromegaly chest - diminished breath sounds at bases CV - irregular/normal S1 and S2; no rubs or gallops;  PMI nondisplaced, 2/6 systolic murmur left sternal border. S2 is not diminished. Abdomen -NT/ND, no HSM, no mass, + bowel sounds, no bruit Femoral pulses are diminished Ext-status post left AKA, status post rightmidfoot amputation Neuro-grossly nonfocal  ECG not performed  Results for orders placed during the hospital encounter of 06/09/13 (from the past 48 hour(s))  GLUCOSE, CAPILLARY     Status: Abnormal   Collection Time    06/16/13  8:45 PM      Result Value Range   Glucose-Capillary 214 (*) 70 - 99 mg/dL  PROTIME-INR     Status: Abnormal   Collection Time    06/17/13  5:00 AM      Result Value Range   Prothrombin Time 24.3 (*) 11.6 - 15.2 seconds   INR 2.27 (*) 0.00 - 1.49  GLUCOSE, CAPILLARY     Status: Abnormal   Collection Time    06/17/13  7:19 AM      Result Value Range   Glucose-Capillary 151 (*) 70 - 99 mg/dL   Comment 1 Notify RN    GLUCOSE, CAPILLARY     Status: Abnormal   Collection Time    06/17/13 11:35 AM      Result Value Range   Glucose-Capillary 181 (*) 70 - 99 mg/dL   Comment 1 Notify RN    GLUCOSE, CAPILLARY     Status: Abnormal   Collection Time    06/17/13  4:35 PM      Result Value Range   Glucose-Capillary 129 (*) 70 - 99  mg/dL   Comment 1 Notify RN    GLUCOSE, CAPILLARY     Status: Abnormal   Collection Time    06/17/13  8:40 PM      Result Value Range   Glucose-Capillary 183 (*) 70 - 99 mg/dL   Comment 1 Notify RN    PROTIME-INR     Status: Abnormal   Collection Time    06/18/13  5:20 AM      Result Value Range   Prothrombin Time 24.9 (*) 11.6 - 15.2 seconds   INR 2.34 (*) 0.00 - 1.49  GLUCOSE, CAPILLARY     Status: Abnormal   Collection Time    06/18/13  7:20 AM      Result Value Range   Glucose-Capillary 172 (*) 70 - 99 mg/dL   Comment 1 Notify RN      GLUCOSE, CAPILLARY     Status: Abnormal   Collection Time    06/18/13 11:32 AM      Result Value Range   Glucose-Capillary 175 (*) 70 - 99 mg/dL   Comment 1 Notify RN    BLOOD GAS, ARTERIAL     Status: Abnormal   Collection Time    06/18/13  2:30 PM      Result Value Range   FIO2 0.21     pH, Arterial 7.468 (*) 7.350 - 7.450   pCO2 arterial 33.2 (*) 35.0 - 45.0 mmHg   pO2, Arterial 81.3  80.0 - 100.0 mmHg   Bicarbonate 23.7  20.0 - 24.0 mEq/L   TCO2 24.7  0 - 100 mmol/L   Acid-Base Excess 0.4  0.0 - 2.0 mmol/L   O2 Saturation 97.8     Patient temperature 98.6     Collection site RIGHT RADIAL     Drawn by [id number]     Sample type ARTERIAL DRAW     Allens test (pass/fail) PASS  PASS  CBC     Status: Abnormal   Collection Time    06/18/13  2:44 PM      Result Value Range   WBC 8.0  4.0 - 10.5 K/uL   RBC 2.63 (*) 3.87 - 5.11 MIL/uL   Hemoglobin 8.0 (*) 12.0 - 15.0 g/dL   HCT 04.5 (*) 40.9 - 81.1 %   MCV 93.5  78.0 - 100.0 fL   MCH 30.4  26.0 - 34.0 pg   MCHC 32.5  30.0 - 36.0 g/dL   RDW 91.4  78.2 - 95.6 %   Platelets 348  150 - 400 K/uL  TYPE AND SCREEN     Status: None   Collection Time    06/18/13  4:35 PM      Result Value Range   ABO/RH(D) A POS     Antibody Screen NEG     Sample Expiration 06/21/2013     Unit Number [id number]     Blood Component Type RED CELLS,LR     Unit division 00     Status of Unit ALLOCATED     Transfusion Status OK TO TRANSFUSE     Crossmatch Result Compatible    PREPARE RBC (CROSSMATCH)     Status: None   Collection Time    06/18/13  4:35 PM      Result Value Range   Order Confirmation ORDER PROCESSED BY BLOOD BANK    GLUCOSE, CAPILLARY     Status: Abnormal   Collection Time    06/18/13  5:30 PM      Result Value Range  Glucose-Capillary 159 (*) 70 - 99 mg/dL   Comment 1 Notify RN      Dg Chest 2 View  06/18/2013   CLINICAL DATA:  Shortness of breath  EXAM: CHEST  2 VIEW  COMPARISON:  06/06/2013  FINDINGS: Cardiac shadow is  within normal limits. Increased density is noted in the left retrocardiac region consistent with atelectasis and associated effusion. Effusion is also noted on the right side without focal infiltrate. No pneumothorax is noted. The bony structures are within normal limits.  IMPRESSION: Bilateral pleural effusions and left retrocardiac atelectasis.   Electronically Signed   By: Alcide Clever M.D.   On: 06/18/2013 17:04    Assessment/Plan 1 acute diastolic congestive heart failure-the patient is volume overloaded on examination. Her chest x-ray shows pleural effusions. Plan Lasix 40 mg IV x1. Reassess tomorrow morning. She will most likely need gentle diuresis for several days. Plan repeat echocardiogram tomorrow. 2 atrial fibrillation-continue Coumadin. Rate is controlled on no medications. 3 status post right midfoot amputation-management per orthopedics. 4 acute blood loss anemia-patient is scheduled for transfusion. Follow hemoglobin.  Olga Millers MD 06/18/2013, 5:45 PM

## 2013-06-18 NOTE — Progress Notes (Signed)
Social Work Patient ID: Sydney [name], female   DOB: July 19, 1921, 79 y.o.   MRN: [id number]  [name], LCSW Social Worker Signed  Patient Care Conference Service date: 06/18/2013 2:00 PM  Inpatient RehabilitationTeam Conference and Plan of Care Update Date: 06/17/2013   Time: 2:30 PM     Patient Name: Sydney [name]       Medical Record Number: [id number]   Date of Birth: 1921/03/03 Sex: Female         Room/Bed: 4W21C/4W21C-01 Payor Info: Payor: MEDICARE / Plan: MEDICARE PART A AND B / Product Type: *No Product type* /   Admitting Diagnosis: old L BKA/New R Midfoot Amp   Admit Date/Time:  06/09/2013  3:10 PM Admission Comments: No comment available   Primary Diagnosis:  Status post transmetatarsal amputation of right foot Principal Problem: Status post transmetatarsal amputation of right foot    Patient Active Problem List     Diagnosis  Date Noted   .  Acute on chronic anemia  06/09/2013   .  Acute renal insufficiency  06/07/2013       Class: Acute   .  Status post transmetatarsal amputation of right foot  06/07/2013       Class: Acute   .  Chest pain  04/29/2013   .  Phantom limb pain  07/24/2012   .  Diabetic neuropathy  07/24/2012   .  Unilateral complete BKA-left  06/24/2012   .  Osteomyelitis of toe  01/05/2012   .  Osteomyelitis of toe of left foot  01/05/2012   .  Chronic osteomyelitis of toe  11/09/2011   .  HYPOTHYROIDISM  06/11/2007   .  DIABETES MELLITUS, TYPE II  06/11/2007   .  HYPERLIPIDEMIA  06/11/2007   .  HYPERTENSION  06/11/2007   .  Atrial fibrillation  06/11/2007   .  COPD  06/11/2007     Expected Discharge Date: Expected Discharge Date: 06/21/13  Team Members Present: Physician leading conference: Dr. Faith Rogue Social Worker Present: Amada Jupiter, LCSW Nurse Present: Daryll Brod, RN PT Present: Karolee Stamps, PT OT Present: Mackie Pai, OT;Patricia Mat Carne, OT PPS Coordinator present : Tora Duck, RN, CRRN;Becky Henrene Dodge, PT     Current Status/Progress  Goal  Weekly Team Focus   Medical     pain better. has a wound on the right heel (?age)  pain control, finalize medical and wound care issues for dc  see prior, wound care, pain control   Bowel/Bladder     continent of bowel and bladder  to remain continent  monitor for constipation and medicate prn   Swallow/Nutrition/ Hydration            ADL's     overall min-mod assist; patient requires total assist for toileting tasks from [facility] level  overall supervision  ADL retraining, activity tolerance/endurance, functional transfers, BUE strengthening exercises. Plan to downgrade goals to a bed level for BADLs secondary to patient's poor overall activity tolerance/endurance and patient's poor motor planning/coordinating abilities with trying to complete lateral leans to pull pants up to waist   Mobility     min to mod A SB transfers, S w/c mobility, difficulty maintaining NWB status on RLE  S for SB transfers, min A car transfer, mod I w/c mobility  transfer training, prosthetic wear/training, strength and endurance, safety   Communication            Safety/Cognition/ Behavioral Observations  Pain     c/o pain to rt. foot  pain less than 3  medicate prior to therapies. monitor for pain    Skin     coban dsg to rt. foot clean, dry and intact  no new breakdown  assess q shift     *See Care Plan and progress notes for long and short-term goals.    Barriers to Discharge:  see prior, wound care needs      Possible Resolutions to Barriers:    education     Discharge Planning/Teaching Needs:    Dtr plans to take pt home with support from other family members, HH, and community resources, such as [facility] benefits for home care and private duty and senior agencies.   Dtr is present everyday and is willing receive family education as therapists feel necessary.    Team Discussion:    Pt is having less phantom pain and is doing better with the slide board and  prosthesis for transfers.  Pt is currently min assist with some mod assist, but team expects pt to meet supervision goals by d/c, with the exception of min assist for car transfers.  Pt was seen by Zella Ball, Outpatient PT, for assistance with prosthesis wearing schedule.   Revisions to Treatment Plan:    Pt's d/c date was moved to 06-21-13 with supervision level goals and min assist for car transfers.    Continued Need for Acute Rehabilitation Level of Care: The patient requires daily medical management by a physician with specialized training in physical medicine and rehabilitation for the following conditions: Daily direction of a multidisciplinary physical rehabilitation program to ensure safe treatment while eliciting the highest outcome that is of practical value to the patient.: Yes Daily medical management of patient stability for increased activity during participation in an intensive rehabilitation regime.: Yes Daily analysis of laboratory values and/or radiology reports with any subsequent need for medication adjustment of medical intervention for : Neurological problems;Post surgical problems  Breyon Sigg, Vista Deck 06/18/2013, 2:01 PM

## 2013-06-18 NOTE — Patient Care Conference (Signed)
Inpatient RehabilitationTeam Conference and Plan of Care Update Date: 06/17/2013   Time: 2:30 PM    Patient Name: Sydney [name]      Medical Record Number: [id number]  Date of Birth: [birth year] Sex: Female         Room/Bed: 4W21C/4W21C-01 Payor Info: Payor: MEDICARE / Plan: MEDICARE PART A AND B / Product Type: *No Product type* /    Admitting Diagnosis: old L BKA/New R Midfoot Amp  Admit Date/Time:  06/09/2013  3:10 PM Admission Comments: No comment available   Primary Diagnosis:  Status post transmetatarsal amputation of right foot Principal Problem: Status post transmetatarsal amputation of right foot  Patient Active Problem List   Diagnosis Date Noted  . Acute on chronic anemia 06/09/2013  . Acute renal insufficiency 06/07/2013    Class: Acute  . Status post transmetatarsal amputation of right foot 06/07/2013    Class: Acute  . Chest pain 04/29/2013  . Phantom limb pain 07/24/2012  . Diabetic neuropathy 07/24/2012  . Unilateral complete BKA-left 06/24/2012  . Osteomyelitis of toe 01/05/2012  . Osteomyelitis of toe of left foot 01/05/2012  . Chronic osteomyelitis of toe 11/09/2011  . HYPOTHYROIDISM 06/11/2007  . DIABETES MELLITUS, TYPE II 06/11/2007  . HYPERLIPIDEMIA 06/11/2007  . HYPERTENSION 06/11/2007  . Atrial fibrillation 06/11/2007  . COPD 06/11/2007    Expected Discharge Date: Expected Discharge Date: 06/21/13  Team Members Present: Physician leading conference: Dr. Faith Rogue Social Worker Present: Amada Jupiter, LCSW Nurse Present: Daryll Brod, RN PT Present: Karolee Stamps, PT OT Present: Mackie Pai, OT;Patricia Mat Carne, OT PPS Coordinator present : Tora Duck, RN, CRRN;Becky Henrene Dodge, PT     Current Status/Progress Goal Weekly Team Focus  Medical   pain better. has a wound on the right heel (?age)  pain control, finalize medical and wound care issues for dc  see prior, wound care, pain control   Bowel/Bladder   continent of bowel and bladder  to  remain continent  monitor for constipation and medicate prn   Swallow/Nutrition/ Hydration             ADL's   overall min-mod assist; patient requires total assist for toileting tasks from [facility] level  overall supervision  ADL retraining, activity tolerance/endurance, functional transfers, BUE strengthening exercises. Plan to downgrade goals to a bed level for BADLs secondary to patient's poor overall activity tolerance/endurance and patient's poor motor planning/coordinating abilities with trying to complete lateral leans to pull pants up to waist   Mobility   min to mod A SB transfers, S w/c mobility, difficulty maintaining NWB status on RLE  S for SB transfers, min A car transfer, mod I w/c mobility  transfer training, prosthetic wear/training, strength and endurance, safety   Communication             Safety/Cognition/ Behavioral Observations            Pain   c/o pain to rt. foot  pain less than 3  medicate prior to therapies. monitor for pain    Skin   coban dsg to rt. foot clean, dry and intact  no new breakdown  assess q shift      *See Care Plan and progress notes for long and short-term goals.  Barriers to Discharge: see prior, wound care needs    Possible Resolutions to Barriers:  education    Discharge Planning/Teaching Needs:  Dtr plans to take pt home with support from other family members, HH, and community resources, such as [place] benefits  for home care and private duty and senior agencies.  Dtr is present everyday and is willing receive family education as therapists feel necessary.   Team Discussion:  Pt is having less phantom pain and is doing better with the slide board and prosthesis for transfers.  Pt is currently min assist with some mod assist, but team expects pt to meet supervision goals by d/c, with the exception of min assist for car transfers.  Pt was seen by Zella Ball, Outpatient PT, for assistance with prosthesis wearing schedule.  Revisions to Treatment  Plan:  Pt's d/c date was moved to 06-21-13 with supervision level goals and min assist for car transfers.   Continued Need for Acute Rehabilitation Level of Care: The patient requires daily medical management by a physician with specialized training in physical medicine and rehabilitation for the following conditions: Daily direction of a multidisciplinary physical rehabilitation program to ensure safe treatment while eliciting the highest outcome that is of practical value to the patient.: Yes Daily medical management of patient stability for increased activity during participation in an intensive rehabilitation regime.: Yes Daily analysis of laboratory values and/or radiology reports with any subsequent need for medication adjustment of medical intervention for : Neurological problems;Post surgical problems  Codi Folkerts, [place] Deck 06/18/2013, 2:01 PM

## 2013-06-18 NOTE — Progress Notes (Signed)
Occupational Therapy Session Notes & Weekly Progress Note  Patient Details  Name: Sydney [name] MRN: [id number] Date of Birth: Nov 27, 1920  Today's Date: 06/18/2013  SESSION NOTES  Session #1 [phone] - 55 Minutes Individual Therapy Patient with 5/10 complaints of pain in right foot; RN aware Patient found supine in bed upon entering room. Patient sat edge of bed with HOB down & using bed rails with supervision. From here, patient completed UB/LB bathing & dressing tasks at bed level (seated edge of bed and supine in bed prn). Patient with SOB throughout session; 02 sats checked randomly and readings fluctuated 84-99%; RN made aware of these readings. Noticed after exertion, patient's sats read higher and after rest breaks readings were lower. Patient completed dressing with supervision and donned prosthesis with set-up/supervision assist. From here, patient transferred edge of bed -> w/c with prosthesis donned and using slide board; minimal assistance. Patient then sat in w/c at sink to complete grooming tasks of washing hands, brushing teeth, brushing hair, and applying makeup. Left patient seated at sink to complete grooming tasks, PT to come in for next session shortly.   Session #2 1130-1200 - 30 Minutes Individual Therapy No complaints of pain Patient found seated in w/c with daughter present in room. Discussed bedside commode transfers with daughter and discussed most optimal ways to complete these transfers at home. Daughter and patient prefer to transfer w/c -> bed -> drop arm BSC. Therapist made it known that this would be multiple transfers for toileting, but both stated that's how they preferred to do it at this time. Therefore, patient transferred w/c -> edge of bed -> drop arm BSC using slide board for all transfers. Daughter completed these transfers with min verbal cues from therapist for safety for herself and safety for patient. Patient required min assist for each transfer. After  toilet transfer, patient transferred [facility] -> edge of bed, doffed prosthesis, and then performed sit -> supine with min assist from daughter. Left patient supine in bed with call bell & phone within reach. Checked left residual limb for any redness or pressure sores, looked okay - slightly pink.   Session #3 1300-1350 - 50 Minutes Individual Therapy No complaints of pain Patient found supine in bed with daughter and friend present at bedside. Patient engaged in bed mobility and sat edge of bed to donn prosthesis. Daughter and friend left at this time. After donning prosthesis, patient transferred edge of bed -> w/c using slide board with minimal assistance. Therapist propelled patient -> therapy gym for BUE strengthening exercises using ergometer. After ~12 minutes on ergometer, therapist propelled patient back to room and patient with request to use bathroom. Patient transferred w/c -> BSC for toileting needs (patient supervision for clothing management and peri care). Patient then transferred [facility] -> edge of bed. Patient doffed pants seated on BSC and donned pants up to waist supine in bed in order to increase independence with this task. From EOB, patient doffed prosthesis, performed sit->supine with supervision and left supine in bed with PRAFO boot donned and all necessary items within reach. Patient on 2 liters of 02 throughout session. Patient stated the 02 through the nasal cannula made breathing easier for her.   ----------------------------------------------------------------------------------------------------------------------------  WEEKLY PROGRESS NOTE  Patient has met 3 of 4 short term goals.  Patient is progressing toward being independent with UB dressing; however recently downgraded goal to supervision secondary to bed level ADL goals. Overall BADL goals downgraded to supervision bed level (as stated below). Patient's  daughter has been present for some OT education. Today daughter and  patient performed drop arm BSC transfers together with min verbal cues for overall safety from therapist. Patient's daughter plans to be present for additional therapy sessions and plan is for patient to discharge to home 11/08.   Patient continues to demonstrate the following deficits: poor overall endurance, decreased functional strength, decreased independence with BADLs/iADLs, decreased indepdnence with functional mobility, decreased independence with functional transfers. Therefore, patient will continue to benefit from skilled OT intervention to enhance overall performance with BADL, iADL and Reduce care partner burden.  Patient not progressing toward long term goals.  See goal revision..  Plan of care revisions: downgraded goals to an overall supervision from bed level as opposed to supervision from w/c level.  OT Short Term Goals Week 1:  OT Short Term Goal 1 (Week 1): Patient will perform LB dressing with minimal assistance from w/c level OT Short Term Goal 1 - Progress (Week 1): Met OT Short Term Goal 2 (Week 1): Patient will perform toilet transfer with minimal assistance using DME prn OT Short Term Goal 2 - Progress (Week 1): Met OT Short Term Goal 3 (Week 1): Patient will be independent with UB dressing OT Short Term Goal 3 - Progress (Week 1): Progressing toward goal OT Short Term Goal 4 (Week 1): Patient will be educated on a HEP OT Short Term Goal 4 - Progress (Week 1): Met  Week 2:  OT Short Term Goal 1 (Week 2): Short Term Goals = Long Term Goals  Skilled Therapeutic Interventions/Progress Updates:  Balance/vestibular training;Community reintegration;Discharge planning;DME/adaptive equipment instruction;Functional mobility training;Pain management;Patient/family education;Psychosocial support;Self Care/advanced ADL retraining;Skin care/wound managment;Splinting/orthotics;Therapeutic Activities;Therapeutic Exercise;UE/LE Strength taining/ROM;UE/LE Coordination  activities;Wheelchair propulsion/positioning   Precautions:  Precautions Precautions: Fall Restrictions Weight Bearing Restrictions: Yes RLE Weight Bearing: Non weight bearing (rt foot)  See FIM for current functional status  Betsabe Iglesia 06/18/2013, 7:30 AM

## 2013-06-18 NOTE — Progress Notes (Signed)
  Addendum  Pt with decreased Oxygen sats in therapy. Better now that in bed. PO2 81 on ABG. No wheezing on chest auscultation. Continued systolic murmur.  On coumadin already for Afib.  CXR pending. Continue oxygen via . Observe for now. Albuterol neb prn. Continue spriva, combivent also. May be an anxiety component as well. Will go ahead and give her a unit of blood as her hgb remains around 8 as this may help her symptomatically    Consider Cards consult in the AM as this is non-emergent.  Ranelle Oyster, MD, [facility] Physical Medicine & Rehabilitation

## 2013-06-18 NOTE — Progress Notes (Signed)
Social Work Patient ID: Sydney [name], female   DOB: Jan 23, 1921, 79 y.o.   MRN: [id number]  CSW met with pt and her dtr to update them on team conference.  They had heard of the change of d/c date and dtr is planning to take the pt home.  [facility] and [facility] for in [facility] through them.  She is working on setting this up.  Dtr's husband's sister gets along with pt well and she will plan to be with pt some in order to give dtr and her husband some respite.  CSW had talked with dtr about CAP services in the past and dtr plans to follow up on that, as well.  CSW gave dtr a packet of resources including PACE, private duty list, CAP info, and Psychologist, sport and exercise.  Dtr was appreciative of CSW support and assured CSW that she can handle pt's care at home and this is what she wants to do right now and she cannot imagine pt in a SNF at this time.  She does plan to talk further with pt about the potential for an ALF move at some point, as her needs become greater.  Pt is realistic about this being a lot for her dtr to continue to provide 24/7 care/supervision.  Dtr feels they have all necessary DME at home, but may need some alterations to pt's current w/c.  They would like to use [place] again for HH/therapies.  CSW will continue to support both pt and dtr and assist with the above as needed.

## 2013-06-18 NOTE — Progress Notes (Addendum)
Physical Therapy Session Note  Patient Details  Name: Sydney [name] MRN: [id number] Date of Birth: 09/24/1920  Today's Date: 06/18/2013 Time: 0930-1030 Time Calculation (min): 60 min  Short Term Goals: Week 2:  PT Short Term Goal 1 (Week 2): = LTGs  Skilled Therapeutic Interventions/Progress Updates:    Pt reports feeling SOB this AM and reports RN already aware. O2 sats taken throughout session and RN notified when O2 sats at 85% on room air at rest, 2 L of O2 via [place] placed and remained > 90%. Session focused on w/c mobility for endurance/strengthening, functional slideboard transfers w/c <-> soft ADL bed to simulate home environment (min A needed for uphill transfer, S slightly downhill), and simulated car transfer to sedan height. During bed transfer in ADL apartment, pt with difficulty problem solving set up of slideboard, initially wanting to transfer without SB like she would at home PTA - with cues pt able to correct. Pt able to complete car transfer with S/steady A with slideboard to almost level surface and did not need cues for set up of slideboard placement, etc as when doing this in the ADL apartment.  Therapy Documentation Precautions:  Precautions Precautions: Fall Restrictions Weight Bearing Restrictions: Yes RLE Weight Bearing: Non weight bearing (rt foot)  Vital Signs: Oxygen Therapy SpO2: 95 % O2 Device: Nasal cannula O2 Flow Rate (L/min): 2 L/min  Pain: C/o 6/10 RLE pain - medicated by RN.  See FIM for current functional status  Therapy/Group: Individual Therapy  Karolee Stamps [facility] 06/18/2013, 10:48 AM

## 2013-06-18 NOTE — Progress Notes (Signed)
Subjective/Complaints: Slept well again. Denies any pain. Wearing prevalon boot A 12 point review of systems has been performed and if not noted above is otherwise negative.   Objective: Vital Signs: Blood pressure 170/60, pulse 90, temperature 98 F (36.7 C), temperature source Oral, resp. rate 17, height 5\' 5"  (1.651 m), weight 73.1 kg (161 lb 2.5 oz), SpO2 97.00%. No results found.  Recent Labs  06/16/13 0605  HGB 8.1*  HCT 24.5*   No results found for this basename: NA, K, CL, CO, GLUCOSE, BUN, CREATININE, CALCIUM,  in the last 72 hours CBG (last 3)   Recent Labs  06/17/13 1635 06/17/13 2040 06/18/13 0720  GLUCAP 129* 183* 172*    Wt Readings from Last 3 Encounters:  06/18/13 73.1 kg (161 lb 2.5 oz)  06/09/13 75 kg (165 lb 5.5 oz)  06/09/13 75 kg (165 lb 5.5 oz)    Physical Exam:  Constitutional: She appears well-developed and well-nourished.  HENT:  Head: Normocephalic and atraumatic.  Eyes: Conjunctivae and EOM are normal. Pupils are equal, round, and reactive to light.  Neck: No JVD present. No tracheal deviation present. No thyromegaly present.  Cardiovascular: Normal rate. Normal rhythm, no murmurs  Respiratory: Effort normal. Chest clear  GI: She exhibits no distension. There is no tenderness. Bowel sounds positive Musculoskeletal:  Left BKA stump well healed. Right foot incision clean and intact with sutures. Has a quarter size area of eschar on the right heel. Non -tender. No drainage. Neurological: No cranial nerve deficit. Coordination normal.  5/5 strength UE's. RUE is 3-4/5. LLE is4/5. Sensory exam grossly intact in both upper extremities.  Psychiatric: She has a normal mood and affect. Her behavior is normal. Judgment and thought content normal with good insight and awareness   Assessment/Plan: 1. Functional deficits secondary to right midfoot amputation (prior left BKA)  which require 3+ hours per day of interdisciplinary therapy in a comprehensive  inpatient rehab setting. Physiatrist is providing close team supervision and 24 hour management of active medical problems listed below. Physiatrist and rehab team continue to assess barriers to discharge/monitor patient progress toward functional and medical goals.  continue to float right foot given heel breakdown---unclear how long this has been present.   -prevalon boot   FIM: FIM - Bathing Bathing Steps Patient Completed: Chest;Right Arm;Left Arm;Abdomen;Right upper leg;Left upper leg;Front perineal area;Buttocks Bathing: 5: Supervision: Safety issues/verbal cues  FIM - Upper Body Dressing/Undressing Upper body dressing/undressing steps patient completed: Thread/unthread right sleeve of pullover shirt/dresss;Thread/unthread left sleeve of pullover shirt/dress;Put head through opening of pull over shirt/dress;Pull shirt over trunk Upper body dressing/undressing: 5: Set-up assist to: Obtain clothing/put away FIM - Lower Body Dressing/Undressing Lower body dressing/undressing steps patient completed: Thread/unthread right pants leg;Thread/unthread left pants leg;Pull pants up/down Lower body dressing/undressing: 5: Supervision: Safety issues/verbal cues  FIM - Toileting Toileting steps completed by patient: Adjust clothing prior to toileting;Performs perineal hygiene Toileting: 1: Total-Patient completed zero steps, helper did all 3  FIM - Diplomatic Services operational officer Devices: Sliding board;Prosthesis Toilet Transfers: 4-From toilet/BSC: Min A (steadying Pt. > 75%)  FIM - Bed/Chair Transfer Bed/Chair Transfer Assistive Devices: Sliding board;Prosthesis;Arm rests;Bed rails Bed/Chair Transfer: 0: Activity did not occur  FIM - Locomotion: Wheelchair Distance: 150 Locomotion: Wheelchair: 5: Travels 150 ft or more: maneuvers on rugs and over door sills with supervision, cueing or coaxing FIM - Locomotion: Ambulation Locomotion: Ambulation: 0: Activity did not  occur  Comprehension Comprehension Mode: Auditory Comprehension: 5-Understands basic 90% of the time/requires cueing <  10% of the time  Expression Expression Mode: Verbal Expression: 5-Expresses basic 90% of the time/requires cueing < 10% of the time.  Social Interaction Social Interaction: 5-Interacts appropriately 90% of the time - Needs monitoring or encouragement for participation or interaction.  Problem Solving Problem Solving: 5-Solves complex 90% of the time/cues < 10% of the time  Memory Memory: 5-Requires cues to use assistive device  Medical Problem List and Plan:  1. H/o A fib/ DVT Prophylaxis/Anticoagulation: Pharmaceutical: Coumadin  2. Neuropathy with phantom pain: continue neurontin bid and hs pamelor.  -this combo has worked and is tolerated so far. 3. Mood: pt in good spirits. Team to provide egosupport as required.  4. Neuropsych: This patient is capable of making decisions on her own behalf.  5. HTN: Will monitor with bid checks. Continue amaryl daily and use SSI for elevated BS. Metformin discontinued due to elevated Cr >=1.4 at Admission--   -follow i's and o's  -BUN better   6. Chronic anemia with superimposed ABLA:   -hgb up to 8.1  7. DM type 2: sugars under control  8. H/o fluid overload: will check weights every day. Will need to stay on dry side. Lasix on hold currently.  9. COPD: Continue spiriva. Encourage IS as well as staying OOB during the day. 10. Low protein stores: continue protein supplements.   LOS (Days) 9 A FACE TO FACE EVALUATION WAS PERFORMED  Thorsten Climer T 06/18/2013 7:40 AM

## 2013-06-18 NOTE — Progress Notes (Signed)
ANTICOAGULATION CONSULT NOTE - Follow Up Consult  Pharmacy Consult for coumadin Indication: atrial fibrillation  Allergies  Allergen Reactions  . Other     Narcotics=makes her very disoriented  . Percocet [Oxycodone-Acetaminophen] Other (See Comments)    Dilerium, hallucinations  . Tramadol Anxiety  . Vicodin [Hydrocodone-Acetaminophen] Anxiety    Patient Measurements: Height: 5\' 5"  (165.1 cm) Weight: 161 lb 2.5 oz (73.1 kg) IBW/kg (Calculated) : 57 Heparin Dosing Weight:   Vital Signs: Temp: 98 F (36.7 C) (11/05 0534) Temp src: Oral (11/05 0534) BP: 170/60 mmHg (11/05 0534) Pulse Rate: 90 (11/05 0534)  Labs:  Recent Labs  06/16/13 0605 06/17/13 0500 06/18/13 0520  HGB 8.1*  --   --   HCT 24.5*  --   --   LABPROT 22.9* 24.3* 24.9*  INR 2.10* 2.27* 2.34*    Estimated Creatinine Clearance: 42 ml/min (by C-G formula based on Cr of 0.89).   Medications:  Scheduled:  . folic acid  1 mg Oral Daily  . gabapentin  300 mg Oral BID  . glimepiride  2 mg Oral QAC breakfast  . insulin aspart  0-9 Units Subcutaneous TID WC  . iron polysaccharides  150 mg Oral BID AC  . levothyroxine  50 mcg Oral QAC breakfast  . nortriptyline  10 mg Oral QHS  . polyethylene glycol  17 g Oral Daily  . protein supplement  1 scoop Oral BID WC  . senna-docusate  1 tablet Oral q morning - 10a  . tiotropium  18 mcg Inhalation Q breakfast  . Warfarin - Pharmacist Dosing Inpatient   Does not apply q1800   Infusions:    Assessment: 79 yo female with hx of afib is currently on therapeutic coumadin.  INR is 2.34 today. Goal of Therapy:  INR 2-3 Monitor platelets by anticoagulation protocol: Yes   Plan:  1) Coumadin 2mg  po x1 2) INR in am  Deyonte Cadden, Tsz-Yin 06/18/2013,8:24 AM

## 2013-06-19 ENCOUNTER — Inpatient Hospital Stay (HOSPITAL_COMMUNITY): Payer: Medicare Other

## 2013-06-19 ENCOUNTER — Inpatient Hospital Stay (HOSPITAL_COMMUNITY): Payer: Medicare Other | Admitting: Occupational Therapy

## 2013-06-19 DIAGNOSIS — I359 Nonrheumatic aortic valve disorder, unspecified: Secondary | ICD-10-CM

## 2013-06-19 LAB — GLUCOSE, CAPILLARY
Glucose-Capillary: 149 mg/dL — ABNORMAL HIGH (ref 70–99)
Glucose-Capillary: 119 mg/dL — ABNORMAL HIGH (ref 70–99)
Glucose-Capillary: 171 mg/dL — ABNORMAL HIGH (ref 70–99)

## 2013-06-19 LAB — BASIC METABOLIC PANEL
BUN: 24 mg/dL — ABNORMAL HIGH (ref 6–23)
Calcium: 9.4 mg/dL (ref 8.4–10.5)
Chloride: 102 mEq/L (ref 96–112)
Creatinine, Ser: 1.05 mg/dL (ref 0.50–1.10)
GFR calc Af Amer: 53 mL/min — ABNORMAL LOW (ref >90)

## 2013-06-19 LAB — PROTIME-INR
INR: 2.57 — ABNORMAL HIGH (ref 0.00–1.49)
Prothrombin Time: 26.7 seconds — ABNORMAL HIGH (ref 11.6–15.2)

## 2013-06-19 LAB — TYPE AND SCREEN
ABO/RH(D): A POS
Unit division: 0

## 2013-06-19 LAB — CBC
Hemoglobin: 9.4 g/dL — ABNORMAL LOW (ref 12.0–15.0)
MCH: 30.5 pg (ref 26.0–34.0)
MCHC: 33.6 g/dL (ref 30.0–36.0)
MCV: 90.9 fL (ref 78.0–100.0)
Platelets: 324 10*3/uL (ref 150–400)
RDW: 15 % (ref 11.5–15.5)

## 2013-06-19 MED ORDER — WARFARIN SODIUM 4 MG PO TABS
4.0000 mg | ORAL_TABLET | Freq: Once | ORAL | Status: AC
  Administered 2013-06-19: 4 mg via ORAL
  Filled 2013-06-19: qty 1

## 2013-06-19 MED ORDER — FUROSEMIDE 20 MG PO TABS
20.0000 mg | ORAL_TABLET | Freq: Every day | ORAL | Status: DC
Start: 2013-06-19 — End: 2013-06-21
  Administered 2013-06-19 – 2013-06-21 (×3): 20 mg via ORAL
  Filled 2013-06-19 (×4): qty 1

## 2013-06-19 MED ORDER — GUAIFENESIN ER 600 MG PO TB12
1200.0000 mg | ORAL_TABLET | Freq: Two times a day (BID) | ORAL | Status: DC
  Administered 2013-06-19 – 2013-06-21 (×5): 1200 mg via ORAL
  Filled 2013-06-19 (×7): qty 2

## 2013-06-19 NOTE — Progress Notes (Signed)
  Echocardiogram 2D Echocardiogram has been performed.  Sydney [name] 06/19/2013, 4:05 PM

## 2013-06-19 NOTE — Progress Notes (Signed)
Physical Therapy Session Note  Patient Details  Name: DIMITRI DSOUZA MRN: [id number] Date of Birth: 11-14-1920  Today's Date: 06/19/2013 Time: [phone] Time Calculation (min): 60 min  Short Term Goals: Week 2:  PT Short Term Goal 1 (Week 2): = LTGs  Skilled Therapeutic Interventions/Progress Updates:    Pt reports feeling pretty worn out from last night but states her breathing is better today. Focused on general strengthening and endurance activities including Nustep on level 3 x 5 min. Pt required  min A for uphill transfer to Nustep seat with SB and S for transfer downhill with A to place SB. Pt with urgency for bathroom. Returned to room and completed toilet transfer, lateral leans for clothing management (required A from therapist due to urgency), from w/c to toilet and then back to w/c. Pt then with another bout of urgency and transferred back onto the commode with nurse tech notified. Pt's daughter plans to be present tomorrow for family education.   Therapy Documentation Precautions:  Precautions Precautions: Fall Restrictions Weight Bearing Restrictions: Yes RLE Weight Bearing: Non weight bearing  Vital Signs: Room air = 91% with activity; HR increased to 169 bpm with 2 min of rest, returned to 92 bpm Pain:  Reports some discomfort in R heel - premedicated.  See FIM for current functional status  Therapy/Group: Individual Therapy  Karolee Stamps [facility] 06/19/2013, 10:12 AM

## 2013-06-19 NOTE — Plan of Care (Signed)
Problem: RH SKIN INTEGRITY Goal: RH STG ABLE TO PERFORM INCISION/WOUND CARE W/ASSISTANCE STG Able To Perform Incision/Wound Care With total Assistance.  Outcome: Progressing Eschar to R heel, requiring drsg change by staff bid.

## 2013-06-19 NOTE — Progress Notes (Signed)
Occupational Therapy Session Notes  Patient Details  Name: Sydney [name] MRN: [id number] Date of Birth: 01-Feb-1921  Today's Date: 06/19/2013  Short Term Goals: Week 1:  OT Short Term Goal 1 (Week 1): Patient will perform LB dressing with minimal assistance from w/c level OT Short Term Goal 1 - Progress (Week 1): Met OT Short Term Goal 2 (Week 1): Patient will perform toilet transfer with minimal assistance using DME prn OT Short Term Goal 2 - Progress (Week 1): Met OT Short Term Goal 3 (Week 1): Patient will be independent with UB dressing OT Short Term Goal 3 - Progress (Week 1): Progressing toward goal OT Short Term Goal 4 (Week 1): Patient will be educated on a HEP OT Short Term Goal 4 - Progress (Week 1): Met  Week 2:  OT Short Term Goal 1 (Week 2): Short Term Goals = Long Term Goals  Skilled Therapeutic Interventions/Progress Updates:   Session #1 [phone] - 18 Minutes Individual Therapy Patient with 5/10 complaints of pain in right heel; RN aware  Upon entering room, patient found supine in bed with RN present changing dressing on right foot. After nursing care, patient engaged in bed mobility and sat edge of bed to complete UB/LB bathing & dressing. After bathing completed & UB dressing completed, patient stated she needed to use bathroom. Patient stated she didn't feel as though she had enough energy to complete [facility] transfer for toileting at this time; therefore used bed pan. From here, patient completed LB dressing seated edge of bed & supine in bed; requiring set-up assist and min verbal cues for memory. Patient tends to weight bear through right heel when donning pants in supine position; therapist encouraged patient to roll instead of trying to bridge during LB dressing. Patient donned prosthesis seated edge of bed with minimal assistance after bed side ADL.  From here, patient transferred edge of bed -> w/c using slide board with moderate assistance. At end of session, left  patient seated in w/c at sink to complete grooming tasks waiting on PT for next therapy session. Patient more fatigued and took more than reasonable amount of time during this OT session.   02 sats checked randomly and were 94-95% during activity and during rest break while patient on room air.  Session #2 1120-1150 - 30 Minutes Individual Therapy No complaints of pain Patient found seated in w/c with family present (daughter & son-in-law). Therapist propelled patient from room -> therapy gym and patient transferred w/c -> edge of mat (uphill transfer using slide board with moderate assistance). While seated edge of mat focused on BUE strengthening exercises using yoga  Mats (tricep pushups). Patient transferred back into w/c after exercises with steady assist using slide board (downhill transfer). Therapist propelled patient back to room and doffed prosthesis, then donned shrinker. Left patient seated in w/c with family present and all necessary items within reach.   Session #3 1345-1430 - 45 Minutes Individual Therapy No complaints of pain Patient found seated in w/c upon entering room. Patient donned sleeve and prosthesis with set-up assist. Patient then propelled self throughout hallway and halfway->gift shop. Therapist propelled patient rest of the way to gift shop, then patient maneuvered w/c throughout gift shop with minimal assistance secondary to gift shop not conducive to w/c users. During activity focused on BUE strengthening, functional mobility using w/c, and community re-entry. Therapist assisted patient back to unit 4000 and patient transferred w/c -> edge of bed with minimal assistance. Therapist assisted patient with doffing  prosthesis, then patient laid supine. Patient left supine in bed with right heel floating on pillow (pillow under calf, NOT heel). All needed items left within reach.    Precautions:  Precautions Precautions: Fall Restrictions Weight Bearing Restrictions:  Yes RLE Weight Bearing: Non weight bearing (rt foot wooden shoe)  See FIM for current functional status  [name] 06/19/2013, 7:14 AM

## 2013-06-19 NOTE — Progress Notes (Signed)
    Subjective:  Denies CP or dyspnea   Objective:  Filed Vitals:   06/18/13 2139 06/18/13 2239 06/18/13 2308 06/19/13 0518  BP: 174/74 135/74 152/77 141/79  Pulse: 93 74 87 86  Temp: 97.7 F (36.5 C) 98 F (36.7 C) 97.6 F (36.4 C) 97.8 F (36.6 C)  TempSrc: Oral Oral Oral Oral  Resp: 18 16 16 18   Height:      Weight:    163 lb 5.8 oz (74.1 kg)  SpO2: 98% 98% 97% 98%    Intake/Output from previous day:  Intake/Output Summary (Last 24 hours) at 06/19/13 7829 Last data filed at 06/19/13 0235  Gross per 24 hour  Intake 1042.92 ml  Output   2075 ml  Net -1032.08 ml    Physical Exam: Physical exam: Well-developed well-nourished in no acute distress.  Skin is warm and dry.  HEENT is normal.  Neck is supple.  Chest is clear to auscultation with normal expansion.  Cardiovascular exam is irregular, 2/6 systolic murmur LSB Abdominal exam nontender or distended. No masses palpated. Extremities s/p left AKA; s/p right mid foot amputation neuro grossly intact    Lab Results: Basic Metabolic Panel:  Recent Labs  56/21/30 0340  NA 141  K 3.8  CL 102  CO2 26  GLUCOSE 118*  BUN 24*  CREATININE 1.05  CALCIUM 9.4   CBC:  Recent Labs  06/18/13 1444 06/19/13 0340  WBC 8.0 6.9  HGB 8.0* 9.4*  HCT 24.6* 28.0*  MCV 93.5 90.9  PLT 348 324     Assessment/Plan:  1 acute diastolic congestive heart failure-volume status improving; change lasix to 20 mg po daily; follow renal function. Plan repeat echocardiogram.  2 atrial fibrillation-continue Coumadin. Rate is controlled on no medications.  3 status post right midfoot amputation-management per orthopedics.  4 acute blood loss anemia-Follow hemoglobin. Some improvement following transfusion.   Olga Millers 06/19/2013, 6:28 AM

## 2013-06-19 NOTE — Plan of Care (Signed)
Problem: RH PAIN MANAGEMENT Goal: RH STG PAIN MANAGED AT OR BELOW PT'S PAIN GOAL Less than 4 Outcome: Progressing No c/o pain     

## 2013-06-19 NOTE — Progress Notes (Signed)
Subjective/Complaints: Feeling better today. No SOB overnight. Busy night with blood tranfusion and work up.  A 12 point review of systems has been performed and if not noted above is otherwise negative.   Objective: Vital Signs: Blood pressure 141/79, pulse 86, temperature 97.8 F (36.6 C), temperature source Oral, resp. rate 18, height 5\' 5"  (1.651 m), weight 74.1 kg (163 lb 5.8 oz), SpO2 98.00%. Dg Chest 2 View  06/18/2013   CLINICAL DATA:  Shortness of breath  EXAM: CHEST  2 VIEW  COMPARISON:  06/06/2013  FINDINGS: Cardiac shadow is within normal limits. Increased density is noted in the left retrocardiac region consistent with atelectasis and associated effusion. Effusion is also noted on the right side without focal infiltrate. No pneumothorax is noted. The bony structures are within normal limits.  IMPRESSION: Bilateral pleural effusions and left retrocardiac atelectasis.   Electronically Signed   By: Alcide Clever M.D.   On: 06/18/2013 17:04    Recent Labs  06/18/13 1444 06/19/13 0340  WBC 8.0 6.9  HGB 8.0* 9.4*  HCT 24.6* 28.0*  PLT 348 324    Recent Labs  06/19/13 0340  NA 141  K 3.8  CL 102  GLUCOSE 118*  BUN 24*  CREATININE 1.05  CALCIUM 9.4   CBG (last 3)   Recent Labs  06/18/13 1730 06/18/13 2033 06/19/13 0728  GLUCAP 159* 122* 149*    Wt Readings from Last 3 Encounters:  06/19/13 74.1 kg (163 lb 5.8 oz)  06/09/13 75 kg (165 lb 5.5 oz)  06/09/13 75 kg (165 lb 5.5 oz)    Physical Exam:  Constitutional: She appears well-developed and well-nourished.  HENT:  Head: Normocephalic and atraumatic.  Eyes: Conjunctivae and EOM are normal. Pupils are equal, round, and reactive to light.  Neck: No JVD present. No tracheal deviation present. No thyromegaly present.  Cardiovascular: Normal rate. Normal rhythm, no murmurs  Respiratory: Effort normal. Chest clear  GI: She exhibits no distension. There is no tenderness. Bowel sounds positive Musculoskeletal:   Left BKA stump well healed. Right foot incision clean and intact with sutures. Has a quarter size area of eschar on the right heel. Non -tender. No drainage. Neurological: No cranial nerve deficit. Coordination normal.  5/5 strength UE's. RUE is 3-4/5. LLE is4/5. Sensory exam grossly intact in both upper extremities.  Psychiatric: She has a normal mood and affect. Her behavior is normal. Judgment and thought content normal with good insight and awareness   Assessment/Plan: 1. Functional deficits secondary to right midfoot amputation (prior left BKA)  which require 3+ hours per day of interdisciplinary therapy in a comprehensive inpatient rehab setting. Physiatrist is providing close team supervision and 24 hour management of active medical problems listed below. Physiatrist and rehab team continue to assess barriers to discharge/monitor patient progress toward functional and medical goals.      FIM: FIM - Bathing Bathing Steps Patient Completed: Chest;Right Arm;Left Arm;Abdomen;Right upper leg;Left upper leg;Front perineal area;Buttocks Bathing: 5: Supervision: Safety issues/verbal cues  FIM - Upper Body Dressing/Undressing Upper body dressing/undressing steps patient completed: Thread/unthread right sleeve of pullover shirt/dresss;Thread/unthread left sleeve of pullover shirt/dress;Put head through opening of pull over shirt/dress;Pull shirt over trunk Upper body dressing/undressing: 5: Set-up assist to: Obtain clothing/put away FIM - Lower Body Dressing/Undressing Lower body dressing/undressing steps patient completed: Thread/unthread right pants leg;Thread/unthread left pants leg;Pull pants up/down Lower body dressing/undressing: 5: Supervision: Safety issues/verbal cues  FIM - Toileting Toileting steps completed by patient: Adjust clothing prior to toileting;Performs perineal hygiene;Adjust  clothing after toileting Toileting: 5: Supervision: Safety issues/verbal cues (with extra  time)  FIM - Diplomatic Services operational officer Devices: Bedside commode;Sliding board Toilet Transfers: 4-From toilet/BSC: Min A (steadying Pt. > 75%);4-To toilet/BSC: Min A (steadying Pt. > 75%)  FIM - Bed/Chair Transfer Bed/Chair Transfer Assistive Devices: Sliding board;Prosthesis;Arm rests Bed/Chair Transfer: 6: Supine > Sit: No assist;5: Sit > Supine: Supervision (verbal cues/safety issues);4: Chair or W/C > Bed: Min A (steadying Pt. > 75%);5: Bed > Chair or W/C: Supervision (verbal cues/safety issues)  FIM - Locomotion: Wheelchair Distance: 150 Locomotion: Wheelchair: 6: Travels 150 ft or more, turns around, maneuvers to table, bed or toilet, negotiates 3% grade: maneuvers on rugs and over door sills independently FIM - Locomotion: Ambulation Locomotion: Ambulation: 0: Activity did not occur  Comprehension Comprehension Mode: Auditory Comprehension: 5-Understands basic 90% of the time/requires cueing < 10% of the time  Expression Expression Mode: Verbal Expression: 5-Expresses basic 90% of the time/requires cueing < 10% of the time.  Social Interaction Social Interaction: 5-Interacts appropriately 90% of the time - Needs monitoring or encouragement for participation or interaction.  Problem Solving Problem Solving: 5-Solves complex 90% of the time/cues < 10% of the time  Memory Memory: 5-Requires cues to use assistive device  Medical Problem List and Plan:  1. H/o A fib/ DVT Prophylaxis/Anticoagulation: Pharmaceutical: Coumadin  2. Neuropathy with phantom pain: continue neurontin bid and hs pamelor.  -this combo has worked and is tolerated so far. 3. Mood: pt in good spirits. Team to provide egosupport as required.  4. Neuropsych: This patient is capable of making decisions on her own behalf.  5. DM II:. Continue amaryl daily and use SSI for elevated BS. Metformin discontinued due to elevated Cr >=1.4 at Admission--   -follow i's and o's  -BUN better   6.  Chronic anemia with superimposed ABLA:   -transfused 1u yesterday to help with dyspnea, activity tolerance  7. CV:  Pleural effusions on cxr  -appreciate cardiology assistance. Lasix given for mild fluid overload---changed to 20mg  PO today  -feeling better this morning.  8. H/o fluid overload: lasix resumed as above. 9. COPD: Continue spiriva. Encourage IS as well as staying OOB during the day. 10. Low protein stores: continue protein supplements.  11. Wounds- continued daily dressing and pressure relief  -wearing prevalon boot religiously  LOS (Days) 10 A FACE TO FACE EVALUATION WAS PERFORMED  [name],[name] 06/19/2013 8:45 AM

## 2013-06-19 NOTE — Progress Notes (Signed)
ANTICOAGULATION CONSULT NOTE - Follow Up Consult  Pharmacy Consult for coumadin Indication: atrial fibrillation  Allergies  Allergen Reactions  . Other     Narcotics=makes her very disoriented  . Percocet [Oxycodone-Acetaminophen] Other (See Comments)    Dilerium, hallucinations  . Tramadol Anxiety  . Vicodin [Hydrocodone-Acetaminophen] Anxiety    Patient Measurements: Height: 5\' 5"  (165.1 cm) Weight: 163 lb 5.8 oz (74.1 kg) IBW/kg (Calculated) : 57 Heparin Dosing Weight:   Vital Signs: Temp: 97.8 F (36.6 C) (11/06 0518) Temp src: Oral (11/06 0518) BP: 141/79 mmHg (11/06 0518) Pulse Rate: 86 (11/06 0518)  Labs:  Recent Labs  06/17/13 0500 06/18/13 0520 06/18/13 1444 06/19/13 0340  HGB  --   --  8.0* 9.4*  HCT  --   --  24.6* 28.0*  PLT  --   --  348 324  LABPROT 24.3* 24.9*  --  26.7*  INR 2.27* 2.34*  --  2.57*  CREATININE  --   --   --  1.05    Estimated Creatinine Clearance: 35.9 ml/min (by C-G formula based on Cr of 1.05).   Medications:  Scheduled:  . fluticasone  1 spray Each Nare Daily  . folic acid  1 mg Oral Daily  . furosemide  20 mg Oral Daily  . gabapentin  300 mg Oral BID  . glimepiride  2 mg Oral QAC breakfast  . guaiFENesin  1,200 mg Oral BID  . insulin aspart  0-9 Units Subcutaneous TID WC  . Ipratropium-Albuterol  1 puff Inhalation QID  . iron polysaccharides  150 mg Oral BID AC  . levothyroxine  50 mcg Oral QAC breakfast  . nortriptyline  10 mg Oral QHS  . polyethylene glycol  17 g Oral Daily  . protein supplement  1 scoop Oral BID WC  . senna-docusate  1 tablet Oral q morning - 10a  . [START ON 06/21/2013] tiotropium  18 mcg Inhalation Q breakfast  . Warfarin - Pharmacist Dosing Inpatient   Does not apply q1800   Infusions:    Assessment: 79 yo female with afib is currently on therapeutic coumadin.  INR is 2.57.   Goal of Therapy:  INR 2-3 Monitor platelets by anticoagulation protocol: Yes   Plan:  - Warfarin 4 mg x 1  (probably ok to resume home dose) - Daily INR's, Monitor for bleeding complications - Coumadin educated   Raeshawn Tafolla, Tsz-Yin 06/19/2013,8:29 AM

## 2013-06-20 ENCOUNTER — Inpatient Hospital Stay (HOSPITAL_COMMUNITY): Payer: Medicare Other | Admitting: Occupational Therapy

## 2013-06-20 ENCOUNTER — Inpatient Hospital Stay (HOSPITAL_COMMUNITY): Payer: Medicare Other

## 2013-06-20 LAB — BASIC METABOLIC PANEL
CO2: 25 mEq/L (ref 19–32)
Calcium: 9.2 mg/dL (ref 8.4–10.5)
Creatinine, Ser: 1.01 mg/dL (ref 0.50–1.10)
GFR calc non Af Amer: 48 mL/min — ABNORMAL LOW (ref >90)
Glucose, Bld: 144 mg/dL — ABNORMAL HIGH (ref 70–99)
Potassium: 3.9 mEq/L (ref 3.5–5.1)
Sodium: 135 mEq/L (ref 135–145)

## 2013-06-20 LAB — PROTIME-INR: Prothrombin Time: 26.3 seconds — ABNORMAL HIGH (ref 11.6–15.2)

## 2013-06-20 LAB — GLUCOSE, CAPILLARY
Glucose-Capillary: 158 mg/dL — ABNORMAL HIGH (ref 70–99)
Glucose-Capillary: 104 mg/dL — ABNORMAL HIGH (ref 70–99)

## 2013-06-20 MED ORDER — WARFARIN SODIUM 2 MG PO TABS
2.0000 mg | ORAL_TABLET | ORAL | Status: AC
  Administered 2013-06-20: 2 mg via ORAL
  Filled 2013-06-20: qty 1

## 2013-06-20 MED ORDER — NORTRIPTYLINE HCL 10 MG PO CAPS: 10.0000 mg | ORAL_CAPSULE | Freq: Every day | ORAL | Status: DC

## 2013-06-20 MED ORDER — WARFARIN SODIUM 4 MG PO TABS
4.0000 mg | ORAL_TABLET | ORAL | Status: DC
  Filled 2013-06-20: qty 1

## 2013-06-20 MED ORDER — FLUTICASONE PROPIONATE 50 MCG/ACT NA SUSP: 1.0000 | Freq: Every day | NASAL | Status: DC | PRN

## 2013-06-20 MED ORDER — SENNOSIDES-DOCUSATE SODIUM 8.6-50 MG PO TABS: 1.0000 | ORAL_TABLET | Freq: Every morning | ORAL | Status: DC

## 2013-06-20 MED ORDER — POLYETHYLENE GLYCOL 3350 17 G PO PACK: 17.0000 g | PACK | Freq: Every day | ORAL | Status: DC

## 2013-06-20 MED ORDER — GUAIFENESIN ER 600 MG PO TB12: 1200.0000 mg | ORAL_TABLET | Freq: Two times a day (BID) | ORAL | Status: DC

## 2013-06-20 MED ORDER — FERROUS FUM-IRON POLYSACCH 162-115.2 MG PO CAPS: 1.0000 | ORAL_CAPSULE | Freq: Two times a day (BID) | ORAL | Status: DC

## 2013-06-20 MED ORDER — ALBUTEROL SULFATE HFA 108 (90 BASE) MCG/ACT IN AERS: 1.0000 | INHALATION_SPRAY | Freq: Four times a day (QID) | RESPIRATORY_TRACT | Status: DC | PRN

## 2013-06-20 MED ORDER — IPRATROPIUM-ALBUTEROL 20-100 MCG/ACT IN AERS
1.0000 | INHALATION_SPRAY | Freq: Four times a day (QID) | RESPIRATORY_TRACT | Status: DC | PRN
  Filled 2013-06-20: qty 4

## 2013-06-20 NOTE — Progress Notes (Signed)
Physical Therapy Discharge Summary  Patient Details  Name: Sydney [name] MRN: [id number] Date of Birth: 11/02/1920  Today's Date: 06/20/2013  Session #1 Time: 6063-0160 Time Calculation (min): 60 min Individual therapy; Pt presents EOB with OT finishing donning prosthesis. Set up for transfer OOB with SB and pt able to complete with close S; cues for positioning to maintain NWB on R foot. Pt with urgency to use BSC; transferred on/off BSC <-> w/c with close S and cues for placement of SB and required total A to don/doff pants and underwear using lateral leans. W/c propulsion on unit and on carpeted surface to simulate home environment mod I. Practiced SB transfer at simulated bed height and bed mobility on flat surface with overall S for transfer (set up of SB needed and cues for technique) and mod I for bed mobility with extra time. Returned to room and discussed plan to increased wear schedule for L prosthesis to 3 hours on and 2 hours off today.  Session #2: Time: 1130-1200 (30 min) Individual therapy; Reports discomfort in R foot and bottom - repositioned as able and returned to bed end of session. Session focused on family education with pt's daughter, [name] in regards to basic transfers, bed mobility, positioning, w/c parts management, and simulated car transfer. [name] was able to safely return demonstrate all transfers at S to min A level with appropriate body mechanics. Reviewed skin examination and wear schedule (increased to 3 hours) with pt and pt's daughter who verbalized and demonstrates understanding.    Patient has met 6 of 6 long term goals due to improved activity tolerance, improved balance, increased strength, decreased pain, ability to compensate for deficits and improved awareness.  Patient to discharge at a wheelchair level Supervision/min A using a sliding board for transfers.   Patient's care partner is independent to provide the necessary physical assistance at discharge. Pt and  pt's daughter have been educated on wear schedule for prosthesis - moving up to 3 hours on and 2 hours off (per recommendation by OPPT PT Vladimir Faster) due to good tolerance of 2 hour on - 2 hour off wear schedule started this week. Family education successfully completed with pt's daughter [name].  Reasons goals not met: n/a - all goals met at this time.  Recommendation:  Patient will benefit from ongoing skilled PT services in home health setting to continue to advance safe functional mobility, address ongoing impairments in strength, balance, maintaining WB precautions, functional mobility, endurance/activity tolerance, and minimize fall risk.  Equipment: elevating legrests for pt's prior w/c (had amputee pad on L). Pt already owns slideboard and w/c  Reasons for discharge: treatment goals met and discharge from hospital  Patient/family agrees with progress made and goals achieved: Yes  PT Discharge Precautions/Restrictions Precautions Precautions: Fall Restrictions Weight Bearing Restrictions: Yes RLE Weight Bearing: Non weight bearing Vision/Perception  Vision - History Baseline Vision: Wears glasses only for reading Patient Visual Report: No change from baseline Vision - Assessment Eye Alignment: Within Functional Limits Perception Perception: Within Functional Limits Praxis Praxis: Intact  Cognition Overall Cognitive Status: Within Functional Limits for tasks assessed Orientation Level: Oriented X4 Memory: Impaired Memory Impairment: Decreased short term memory Decreased Short Term Memory: Functional basic Awareness: Appears intact Problem Solving: Impaired Problem Solving Impairment: Functional basic;Functional complex Safety/Judgment: Appears intact Comments: patient continues to exhibit slow processing during functional tasks Sensation Sensation Additional Comments: Patient with some complaints of numbness and tingling in bilateral  hands/fingers Coordination Gross Motor Movements are Fluid and  Coordinated: Yes Fine Motor Movements are Fluid and Coordinated: Yes Motor  Motor Motor: Within Functional Limits;Other (comment)  Trunk/Postural Assessment  Cervical Assessment Cervical Assessment: Within Functional Limits Thoracic Assessment Thoracic Assessment: Within Functional Limits Lumbar Assessment Lumbar Assessment: Within Functional Limits Postural Control Postural Control: Within Functional Limits  Balance Static Sitting Balance Static Sitting - Level of Assistance: 7: Independent Dynamic Sitting Balance Dynamic Sitting - Level of Assistance: 6: Modified independent (Device/Increase time) Extremity Assessment  RUE Assessment RUE Assessment: Within Functional Limits LUE Assessment LUE Assessment: Within Functional Limits RLE Assessment RLE Assessment: Exceptions to [facility] RLE Strength RLE Overall Strength Comments: same as admission. Limited testing able on R ankle due to ACE wrap LLE Assessment LLE Assessment: Exceptions to [facility] LLE Strength LLE Overall Strength Comments: same as admission; BKA  See FIM for current functional status  Karolee Stamps [facility] 06/20/2013, 12:14 PM

## 2013-06-20 NOTE — Progress Notes (Signed)
ANTICOAGULATION CONSULT NOTE - Follow Up Consult  Pharmacy Consult for coumadin Indication: atrial fibrillation  Allergies  Allergen Reactions  . Other     Narcotics=makes her very disoriented  . Percocet [Oxycodone-Acetaminophen] Other (See Comments)    Dilerium, hallucinations  . Tramadol Anxiety  . Vicodin [Hydrocodone-Acetaminophen] Anxiety    Patient Measurements: Height: 5\' 5"  (165.1 cm) Weight: 165 lb 12.6 oz (75.2 kg) IBW/kg (Calculated) : 57 Heparin Dosing Weight:   Vital Signs: Temp: 98 F (36.7 C) (11/07 0516) Temp src: Oral (11/07 0516) BP: 158/81 mmHg (11/07 0516) Pulse Rate: 77 (11/07 0516)  Labs:  Recent Labs  06/18/13 0520 06/18/13 1444 06/19/13 0340 06/20/13 0600  HGB  --  8.0* 9.4*  --   HCT  --  24.6* 28.0*  --   PLT  --  348 324  --   LABPROT 24.9*  --  26.7* 26.3*  INR 2.34*  --  2.57* 2.52*  CREATININE  --   --  1.05 1.01    Estimated Creatinine Clearance: 37.6 ml/min (by C-G formula based on Cr of 1.01).   Medications:  Scheduled:  . fluticasone  1 spray Each Nare Daily  . folic acid  1 mg Oral Daily  . furosemide  20 mg Oral Daily  . gabapentin  300 mg Oral BID  . glimepiride  2 mg Oral QAC breakfast  . guaiFENesin  1,200 mg Oral BID  . insulin aspart  0-9 Units Subcutaneous TID WC  . Ipratropium-Albuterol  1 puff Inhalation QID  . iron polysaccharides  150 mg Oral BID AC  . levothyroxine  50 mcg Oral QAC breakfast  . nortriptyline  10 mg Oral QHS  . polyethylene glycol  17 g Oral Daily  . protein supplement  1 scoop Oral BID WC  . senna-docusate  1 tablet Oral q morning - 10a  . [START ON 06/21/2013] tiotropium  18 mcg Inhalation Q breakfast  . Warfarin - Pharmacist Dosing Inpatient   Does not apply q1800   Infusions:    Assessment: 79 yo female with hx of afib is currently on therapeutic coumadin.  INR today is 2.52. Goal of Therapy:  INR 2-3 Monitor platelets by anticoagulation protocol: Yes   Plan:  - Resume home  coumadin of 4mg  daily, except 2 mg MWF - Daily INR's, Monitor for bleeding complications. If INR is good on Sat, can change to MWF   Bricen Victory, Tsz-Yin 06/20/2013,8:28 AM

## 2013-06-20 NOTE — Progress Notes (Signed)
Occupational Therapy Session Notes  Patient Details  Name: BRIELLA HOBDAY MRN: [id number] Date of Birth: June 12, 1921  Today's Date: 06/20/2013  Short Term Goals: Week 1:  OT Short Term Goal 1 (Week 1): Patient will perform LB dressing with minimal assistance from w/c level OT Short Term Goal 1 - Progress (Week 1): Met OT Short Term Goal 2 (Week 1): Patient will perform toilet transfer with minimal assistance using DME prn OT Short Term Goal 2 - Progress (Week 1): Met OT Short Term Goal 3 (Week 1): Patient will be independent with UB dressing OT Short Term Goal 3 - Progress (Week 1): Progressing toward goal OT Short Term Goal 4 (Week 1): Patient will be educated on a HEP OT Short Term Goal 4 - Progress (Week 1): Met  Week 2:  OT Short Term Goal 1 (Week 2): Short Term Goals = Long Term Goals  Skilled Therapeutic Interventions/Progress Updates:   Session #1 [phone] - 6 Minutes Individual Therapy No complaints of pain Patient found supine in bed eating with HOB raised. Patient needed RLE dressing changed, therapist notified RN of this. RN present to change dressing. Patient then engaged in bed mobility in order to complete UB/LB bathing & dressing tasks seated edge of bed and laying in supine position prn. Dr. Evangeline Gula present to speak with patient a few minutes in middle of OT session. Patient continues to exhibit some short term memory loss and poor processing abilities during functional tasks. Patient forgets to pull pants up and requests to get into w/c when pants are halfway up and without prosthesis donned. Therapist provides min questioning cues to assist patient. Prior to getting in w/c, patient performed rolling left <>right on bed to pull pants up and then donned prosthesis with supervision. Patient left seated edge of bed with PT present for next therapy session.   02 sats checked randomly while patient on room air and sats = <93% during activity and during rest.   Session  #2 [id number] - 45 Minutes Individual Therapy No complaints of pain Patient found supine in bed with daughter present at bedside. Patient engaged in bed mobility and sat on left side of bed. From here, patient donned prosthesis with set-up assistance, then transferred edge of bed -> drop arm BSC with supervision. Patient doffed underwear and pants (with assistance from therapist), engaged in toileting needs, then attempted to pull pants back up to waist. Patient with accident on underwear, therefore transferred back to bed to change; transferred back with minimal assistance using slide board. Patient with decreased independence with toilet transfer and toileting tasks secondary to fatigue, urine urgency, and SOB. Patient did not meet these goals, see discharge summary for more information. At end of session, therapist donned a brief and left patient supine in bed with all needed items within reach.   Precautions:  Precautions Precautions: Fall Restrictions Weight Bearing Restrictions: Yes RLE Weight Bearing: Non weight bearing (to R foot)  See FIM for current functional status  Verbena Boeding 06/20/2013, 7:25 AM

## 2013-06-20 NOTE — Progress Notes (Signed)
Occupational Therapy Discharge Summary  Patient Details  Name: Sydney [name] MRN: [id number] Date of Birth: [birth year]  Today's Date: 06/20/2013  Patient has met 6 of 8 long term goals due to improved activity tolerance, improved balance, postural control, ability to compensate for deficits, improved attention, improved awareness and improved coordination.  Patient to discharge at overall supervision -> minimal assistance level.  Patient's care partner is independent to provide the necessary supervision->min assist assistance at discharge. Patient tends to weight bear through right heel during bed mobility, therapist continuously gives patient verbal and tactile cues to not put any weight through right LE; daughter gives cues to patient appropriately and prn as well.   Reasons goals not met: Patient did not meet toileting goal or toilet transfer goal. Patient requires min assist for toilet transfers at times and max assist for toileting occasionally. Patient fatigues quickly, has SOB, and has urine urgencies that require assistance during toileting and toileting transfers. Patient's daughter is independent to help her prn.   Recommendation:  Patient will benefit from ongoing skilled OT services in home health setting to continue to advance functional skills in the area of BADL, iADL and Reduce care partner burden.  Equipment: No equipment provided, patient has drop arm BSC and shower seat.   Reasons for discharge: treatment goals met and discharge from hospital  Patient/family agrees with progress made and goals achieved: Yes  Precautions/Restrictions  Precautions Precautions: Fall Restrictions Weight Bearing Restrictions: Yes RLE Weight Bearing: Non weight bearing  ADL - See FIM  Vision/Perception  Vision - History Baseline Vision: Wears glasses only for reading Patient Visual Report: No change from baseline Vision - Assessment Eye Alignment: Within Functional  Limits Perception Perception: Within Functional Limits Praxis Praxis: Intact   Cognition Overall Cognitive Status: Within Functional Limits for tasks assessed Orientation Level: Oriented X4 Memory: Impaired Memory Impairment: Decreased short term memory Decreased Short Term Memory: Functional basic Awareness: Appears intact Problem Solving: Impaired Problem Solving Impairment: Functional basic;Functional complex Safety/Judgment: Appears intact Comments: patient continues to exhibit slow processing during functional tasks  Sensation Sensation Additional Comments: Patient with some complaints of numbness and tingling in bilateral hands/fingers Coordination Gross Motor Movements are Fluid and Coordinated: Yes Fine Motor Movements are Fluid and Coordinated: Yes  Motor  Motor Motor: Within Functional Limits;Other (comment)  Trunk/Postural Assessment  Cervical Assessment Cervical Assessment: Within Functional Limits Thoracic Assessment Thoracic Assessment: Within Functional Limits Lumbar Assessment Lumbar Assessment: Within Functional Limits Postural Control Postural Control: Within Functional Limits   Balance Static Sitting Balance Static Sitting - Level of Assistance: 7: Independent Dynamic Sitting Balance Dynamic Sitting - Level of Assistance: 6: Modified independent (Device/Increase time)  Extremity/Trunk Assessment RUE Assessment RUE Assessment: Within Functional Limits LUE Assessment LUE Assessment: Within Functional Limits  See FIM for current functional status  Jameer Storie 06/20/2013, 1:00 PM

## 2013-06-20 NOTE — Progress Notes (Signed)
Subjective/Complaints: Feels well. Excited to go home. Pain controlled.   A 12 point review of systems has been performed and if not noted above is otherwise negative.   Objective: Vital Signs: Blood pressure 158/81, pulse 77, temperature 98 F (36.7 C), temperature source Oral, resp. rate 18, height 5\' 5"  (1.651 m), weight 75.2 kg (165 lb 12.6 oz), SpO2 100.00%. Dg Chest 2 View  06/18/2013   CLINICAL DATA:  Shortness of breath  EXAM: CHEST  2 VIEW  COMPARISON:  06/06/2013  FINDINGS: Cardiac shadow is within normal limits. Increased density is noted in the left retrocardiac region consistent with atelectasis and associated effusion. Effusion is also noted on the right side without focal infiltrate. No pneumothorax is noted. The bony structures are within normal limits.  IMPRESSION: Bilateral pleural effusions and left retrocardiac atelectasis.   Electronically Signed   By: Alcide Clever M.D.   On: 06/18/2013 17:04    Recent Labs  06/18/13 1444 06/19/13 0340  WBC 8.0 6.9  HGB 8.0* 9.4*  HCT 24.6* 28.0*  PLT 348 324    Recent Labs  06/19/13 0340 06/20/13 0600  NA 141 135  K 3.8 3.9  CL 102 98  GLUCOSE 118* 144*  BUN 24* 25*  CREATININE 1.05 1.01  CALCIUM 9.4 9.2   CBG (last 3)   Recent Labs  06/19/13 1620 06/19/13 2101 06/20/13 0733  GLUCAP 119* 171* 158*    Wt Readings from Last 3 Encounters:  06/20/13 75.2 kg (165 lb 12.6 oz)  06/09/13 75 kg (165 lb 5.5 oz)  06/09/13 75 kg (165 lb 5.5 oz)    Physical Exam:  Constitutional: She appears well-developed and well-nourished.  HENT:  Head: Normocephalic and atraumatic.  Eyes: Conjunctivae and EOM are normal. Pupils are equal, round, and reactive to light.  Neck: No JVD present. No tracheal deviation present. No thyromegaly present.  Cardiovascular: Normal rate. Normal rhythm, no murmurs  Respiratory: Effort normal. Chest clear  GI: She exhibits no distension. There is no tenderness. Bowel sounds  positive Musculoskeletal:  Left BKA stump well healed. Right foot incision clean and intact with sutures. Has a quarter size area of eschar on the right heel. Non -tender. No drainage. Neurological: No cranial nerve deficit. Coordination normal.  5/5 strength UE's. RUE is 3-4/5. LLE is4/5. Sensory exam grossly intact in both upper extremities.  Psychiatric: She has a normal mood and affect. Her behavior is normal. Judgment and thought content normal with good insight and awareness   Assessment/Plan: 1. Functional deficits secondary to right midfoot amputation (prior left BKA)  which require 3+ hours per day of interdisciplinary therapy in a comprehensive inpatient rehab setting. Physiatrist is providing close team supervision and 24 hour management of active medical problems listed below. Physiatrist and rehab team continue to assess barriers to discharge/monitor patient progress toward functional and medical goals.      FIM: FIM - Bathing Bathing Steps Patient Completed: Chest;Right Arm;Left Arm;Abdomen;Right upper leg;Left upper leg;Front perineal area;Buttocks Bathing: 5: Supervision: Safety issues/verbal cues  FIM - Upper Body Dressing/Undressing Upper body dressing/undressing steps patient completed: Thread/unthread right sleeve of pullover shirt/dresss;Thread/unthread left sleeve of pullover shirt/dress;Put head through opening of pull over shirt/dress;Pull shirt over trunk Upper body dressing/undressing: 5: Supervision: Safety issues/verbal cues FIM - Lower Body Dressing/Undressing Lower body dressing/undressing steps patient completed: Thread/unthread right pants leg;Thread/unthread left pants leg;Pull pants up/down;Thread/unthread right underwear leg;Thread/unthread left underwear leg;Pull underwear up/down Lower body dressing/undressing: 5: Supervision: Safety issues/verbal cues  FIM - Toileting Toileting steps  completed by patient: Performs perineal hygiene Toileting: 2:  Max-Patient completed 1 of 3 steps  FIM - Diplomatic Services operational officer Devices: Human resources officer Transfers: 0-Activity did not occur  FIM - Banker Devices: Sliding board;Prosthesis;Arm rests Bed/Chair Transfer: 6: Assistive device: no helper;5: Supine > Sit: Supervision (verbal cues/safety issues);3: Bed > Chair or W/C: Mod A (lift or lower assist)  FIM - Locomotion: Wheelchair Distance: 150 Locomotion: Wheelchair: 6: Travels 150 ft or more, turns around, maneuvers to table, bed or toilet, negotiates 3% grade: maneuvers on rugs and over door sills independently FIM - Locomotion: Ambulation Locomotion: Ambulation: 0: Activity did not occur  Comprehension Comprehension Mode: Auditory Comprehension: 5-Understands complex 90% of the time/Cues < 10% of the time  Expression Expression Mode: Verbal Expression: 5-Expresses basic 90% of the time/requires cueing < 10% of the time.  Social Interaction Social Interaction: 5-Interacts appropriately 90% of the time - Needs monitoring or encouragement for participation or interaction.  Problem Solving Problem Solving: 4-Solves basic 75 - 89% of the time/requires cueing 10 - 24% of the time  Memory Memory: 5-Requires cues to use assistive device  Medical Problem List and Plan:  1. H/o A fib/ DVT Prophylaxis/Anticoagulation: Pharmaceutical: Coumadin  2. Neuropathy with phantom pain: continue neurontin bid and hs pamelor.  -this combo has worked and is tolerated so far. 3. Mood: pt in good spirits. Team to provide egosupport as required.  4. Neuropsych: This patient is capable of making decisions on her own behalf.  5. DM II:. Continue amaryl daily and use SSI for elevated BS. Metformin discontinued due to elevated Cr >=1.4 at Admission--   -follow i's and o's  -BUN better   6. Chronic anemia with superimposed ABLA:   -transfused 1u yesterday to help with dyspnea,  activity tolerance  7. CV:  Pleural effusions on cxr  -appreciate cardiology assistance. Lasix 20mg  po  -feeling better   -echo ok  -would recheck bmet next week 8. H/o fluid overload: lasix resumed as above. 9. COPD: Continue spiriva. Encourage IS as well as staying OOB during the day. 10. Low protein stores: continue protein supplements.  11. Wounds- continued daily dressing and pressure relief  -wearing prevalon boot religiously  -heel wound appears to have been there for some time  -needs follow up with ortho next week  LOS (Days) 11 A FACE TO FACE EVALUATION WAS PERFORMED  Sydney [name] T 06/20/2013 8:18 AM

## 2013-06-20 NOTE — Discharge Summary (Signed)
Physician Discharge Summary  Patient ID: Sydney [name] MRN: [id number] DOB/AGE: 1920/10/02 79 y.o.  Admit date: 06/09/2013 Discharge date: 06/20/2013  Discharge Diagnoses:  Principal Problem:   Status post transmetatarsal amputation of right foot Active Problems:   DIABETES MELLITUS, TYPE II   Atrial fibrillation   Unilateral complete BKA-left   Diabetic neuropathy   Acute renal insufficiency   Acute on chronic anemia   Acute diastolic heart failure   Discharged Condition:  Stable  Significant Diagnostic Studies: Dg Chest 2 View  06/18/2013   CLINICAL DATA:  Shortness of breath  EXAM: CHEST  2 VIEW  COMPARISON:  06/06/2013  FINDINGS: Cardiac shadow is within normal limits. Increased density is noted in the left retrocardiac region consistent with atelectasis and associated effusion. Effusion is also noted on the right side without focal infiltrate. No pneumothorax is noted. The bony structures are within normal limits.  IMPRESSION: Bilateral pleural effusions and left retrocardiac atelectasis.   Electronically Signed   By: Alcide Clever M.D.   On: 06/18/2013 17:04   Dg Chest 2 View  06/06/2013   CLINICAL DATA:  Preoperative evaluation prior right foot amputation.  EXAM: CHEST  2 VIEW  COMPARISON:  CHEST x-ray 10/16/2011.  FINDINGS: Lung volumes are normal. No consolidative airspace disease. No pleural effusions. No pneumothorax. No pulmonary nodule or mass noted. Pulmonary vasculature and the cardiomediastinal silhouette are within normal limits. Mild bilateral apical pleuroparenchymal thickening and calcification, compatible with chronic pleural parenchymal scarring (unchanged). Atherosclerosis in the thoracic aorta.  IMPRESSION: 1.  No radiographic evidence of acute cardiopulmonary disease. 2. Atherosclerosis.   Electronically Signed   By: Trudie Reed M.D.   On: 06/06/2013 12:40    Labs:  Basic Metabolic Panel:  Recent Labs Lab 06/19/13 0340 06/20/13 0600  NA 141 135  K 3.8  3.9  CL 102 98  CO2 26 25  GLUCOSE 118* 144*  BUN 24* 25*  CREATININE 1.05 1.01  CALCIUM 9.4 9.2    CBC:  Recent Labs Lab 06/16/13 0605 06/18/13 1444 06/19/13 0340  WBC  --  8.0 6.9  HGB 8.1* 8.0* 9.4*  HCT 24.5* 24.6* 28.0*  MCV  --  93.5 90.9  PLT  --  348 324    CBG:  Recent Labs Lab 06/19/13 1102 06/19/13 1620 06/19/13 2101 06/20/13 0733 06/20/13 1128  GLUCAP 214* 119* 171* 158* 198*    Brief HPI:   Sydney [name] is a 79 y.o. female with history of diabetes with peripheral neuropathy, HTN, A fib with chronic coumadin, severe PVD s/p L-BKA (CIR stay Nov 2013) and now with gangrenous changes right foot. Patient admitted on 06/06/13 for right mid foot amputation by Dr. Lajoyce Corners. Post op NWB-RLE. Pain control improving and able to tolerate activity. PT evaluation done yesterday--patient now ready for left prosthesis training as L-BKA wound has finally healed. CIR was recommended by therapy team.    Hospital Course: Sydney [name] was admitted to rehab 06/09/2013 for inpatient therapies to consist of PT, ST and OT at least three hours five days a week. Past admission physiatrist, therapy team and rehab RN have worked together to provide customized collaborative inpatient rehab. Vitals were monitored on bid basis and blood pressures have been reasonably controlled. PO intake has been good and BS have been stable. Right foot surgical dressing was changed on 06/17/13 and she was noted to have a pressure area on right heel. Prevalon boot was ordered and patient has been advised to use this at  all times. Routine labs were done with close monitoring of H/H. This was slowly on upward trend but patient reported dyspnea and was noted to have hypoxia with activity on 06/18/13. She was transfused with one unit PRBC. Cardiology was consulted on input and felt that patient had acute on chronic CHF as evidenced by pleural effusions and she was treated with IV diuretics. 2 D echo showed EF at 60%  and no wall abnormality. Patient to resume lasix 20 mg daily at discharge. Follow up labs to be done next week with results to cardiology and primary care. She was discharged to home on 06/21/13. Home therapies to continue past discharge.    Rehab course: During patient's stay in rehab weekly team conferences were held to monitor patient's progress, set goals and discuss barriers to discharge. Patient has had improvement in activity tolerance, balance, postural control, as well as ability to compensate for deficits. She requires supervision to min assist for ADL tasks. She tends to use right heel for bed mobility and has been advised against this to prevent worsening of pressure area. She requires supervision to min assist for sliding board transfers. She is able to propel wheelchair independently for household distances. Prosthesis training was initiated and patient to increase wearing to three hours on and 2 hours on during the day. Family education was done with daughter who will provide assistance needed past discharge.    Disposition:  Home  Diet: Diabetic diet. Low salt.   Special Instructions: 1. Wear foam shoe on right foot at all times. 2. Check blood sugars 2-3 times a day. 3. Change right foot dressing daily.     Future Appointments Provider Department Dept Phone   07/23/2013 11:00 AM Ranelle Oyster, MD [facility] Physical Medicine and Rehabilitation [phone]       Medication List    ASK your doctor about these medications       acetaminophen 325 MG tablet  Commonly known as:  TYLENOL  Take 1-2 tablets (325-650 mg total) by mouth every 4 (four) hours as needed.     calcium carbonate 600 MG Tabs tablet  Commonly known as:  OS-CAL  Take 600 mg by mouth daily.     [employer] CALCIUM SOFT CHEWS PO  Take 2 tablets by mouth daily.     cyanocobalamin 500 MCG tablet  Take 500 mcg by mouth daily.     doxycycline 100 MG capsule  Commonly known as:  VIBRAMYCIN  Take 100 mg by  mouth 2 (two) times daily.     ferrous fumarate-iron polysaccharide complex 162-115.2 MG Caps  Commonly known as:  TANDEM  Take 1 capsule by mouth daily with breakfast.     fish oil-omega-3 fatty acids 1000 MG capsule  Take 1 g by mouth daily.     folic acid 1 MG tablet  Commonly known as:  FOLVITE  Take 1 mg by mouth daily.     furosemide 20 MG tablet  Commonly known as:  LASIX  Take 20 mg by mouth daily.     gabapentin 300 MG capsule  Commonly known as:  NEURONTIN  Take 300 mg by mouth 2 (two) times daily.     glimepiride 4 MG tablet  Commonly known as:  AMARYL  Take 2 mg by mouth daily before breakfast.     ICAPS AREDS FORMULA PO  Take 1 capsule by mouth daily with breakfast.     levothyroxine 50 MCG tablet  Commonly known as:  SYNTHROID, LEVOTHROID  Take 50 mcg by mouth daily with breakfast.     lisinopril-hydrochlorothiazide 20-25 MG per tablet  Commonly known as:  PRINZIDE,ZESTORETIC  Take 1 tablet by mouth daily with breakfast.     metFORMIN 500 MG tablet  Commonly known as:  GLUCOPHAGE  Take 500 mg by mouth at bedtime.     methocarbamol 500 MG tablet  Commonly known as:  ROBAXIN  Take 1 tablet (500 mg total) by mouth every 6 (six) hours as needed.     METHOTREXATE SODIUM IJ  Inject 0.4 mLs as directed once a week. On tuesday     multivitamin with minerals Tabs tablet  Take 1 tablet by mouth daily.     nitroGLYCERIN 0.2 mg/hr patch  Commonly known as:  NITRODUR - Dosed in mg/24 hr  Place 1 patch onto the skin at bedtime. On the foot     oxyCODONE-acetaminophen 5-325 MG per tablet  Commonly known as:  ROXICET  Take 1 tablet by mouth every 4 (four) hours as needed for pain.     protein supplement Powd  Take 1 scoop by mouth 2 (two) times daily with a meal.     tiotropium 18 MCG inhalation capsule  Commonly known as:  SPIRIVA  Place 18 mcg into inhaler and inhale daily with breakfast.     vitamin C 500 MG tablet  Commonly known as:  ASCORBIC ACID   Take 500 mg by mouth daily.     Vitamin D3 400 UNITS Caps  Take 400 Units by mouth daily.     warfarin 4 MG tablet  Commonly known as:  COUMADIN  Take 2-4 mg by mouth See admin instructions. Take 2mg  on Monday ,wednesday, Friday.  Take 4mg  on Tuesday, Thursday, Saturday, sunday           Follow-up Information   Follow up with Ranelle Oyster, MD On 07/23/2013. (Be there at 10:30   for 11 am   appointment)    Specialty:  Physical Medicine and Rehabilitation   Contact information:   [address], [address] [place] [address] [phone]       Follow up with Nadara Mustard, MD in two weeks.     Specialty:  Orthopedic Surgery   Contact information:   [address] [place] [address] [phone]       Follow up with Olga Millers, MD. Call today. (for appointment in 6 weeks. )    Specialty:  Cardiology   Contact information:   [address] [place] [address] [phone]       Follow up with ARONSON,RICHARD A, MD. (They will call with your appointment.)    Specialty:  Internal Medicine   Contact information:   [address] [facility] [place] [address] [phone]       Signed: Jacquelynn Cree 06/20/2013, 2:32 PM

## 2013-06-20 NOTE — Progress Notes (Signed)
    Subjective:  Denies CP or dyspnea   Objective:  Filed Vitals:   06/19/13 1235 06/19/13 1500 06/19/13 2109 06/20/13 0516  BP:  156/79  158/81  Pulse:  74  77  Temp:  98 F (36.7 C)  98 F (36.7 C)  TempSrc:  Oral  Oral  Resp: 18 18  18   Height:      Weight:    165 lb 12.6 oz (75.2 kg)  SpO2:  94% 95% 100%    Intake/Output from previous day:  Intake/Output Summary (Last 24 hours) at 06/20/13 2536 Last data filed at 06/20/13 6440  Gross per 24 hour  Intake    720 ml  Output    700 ml  Net     20 ml    Physical Exam: Physical exam: Well-developed well-nourished in no acute distress.  Skin is warm and dry.  HEENT is normal.  Neck is supple.  Chest is clear to auscultation with normal expansion.  Cardiovascular exam is irregular, 2/6 systolic murmur LSB Abdominal exam nontender or distended. No masses palpated. Extremities s/p left AKA; s/p right mid foot amputation neuro grossly intact    Lab Results: Basic Metabolic Panel:  Recent Labs  34/74/25 0340 06/20/13 0600  NA 141 135  K 3.8 3.9  CL 102 98  CO2 26 25  GLUCOSE 118* 144*  BUN 24* 25*  CREATININE 1.05 1.01  CALCIUM 9.4 9.2   CBC:  Recent Labs  06/18/13 1444 06/19/13 0340  WBC 8.0 6.9  HGB 8.0* 9.4*  HCT 24.6* 28.0*  MCV 93.5 90.9  PLT 348 324     Assessment/Plan:  1 acute diastolic congestive heart failure-volume status improving; continue lasix 20 mg po daily; follow renal function. Echo shows normal LV function. 2 atrial fibrillation-continue Coumadin. Rate is controlled on no medications.  3 status post right midfoot amputation-management per orthopedics.  4 acute blood loss anemia-Follow hemoglobin. Some improvement following transfusion.   Olga Millers 06/20/2013, 8:12 AM

## 2013-06-21 DIAGNOSIS — I10 Essential (primary) hypertension: Secondary | ICD-10-CM

## 2013-06-21 DIAGNOSIS — G547 Phantom limb syndrome without pain: Secondary | ICD-10-CM

## 2013-06-21 DIAGNOSIS — E119 Type 2 diabetes mellitus without complications: Secondary | ICD-10-CM

## 2013-06-21 LAB — PROTIME-INR
INR: 2.71 — ABNORMAL HIGH (ref 0.00–1.49)
Prothrombin Time: 27.8 seconds — ABNORMAL HIGH (ref 11.6–15.2)

## 2013-06-21 LAB — GLUCOSE, CAPILLARY: Glucose-Capillary: 200 mg/dL — ABNORMAL HIGH (ref 70–99)

## 2013-06-21 MED ORDER — HYDROCHLOROTHIAZIDE 12.5 MG PO CAPS
12.5000 mg | ORAL_CAPSULE | Freq: Every day | ORAL | Status: DC
  Filled 2013-06-21 (×2): qty 1

## 2013-06-21 MED ORDER — LISINOPRIL 10 MG PO TABS
10.0000 mg | ORAL_TABLET | Freq: Every day | ORAL | Status: DC
  Filled 2013-06-21 (×2): qty 1

## 2013-06-21 NOTE — Care Management Note (Signed)
    Page 1 of 2   06/21/2013     2:56:39 PM   CARE MANAGEMENT NOTE 06/21/2013  Patient:  Sydney [name], Sydney [name]   Account Number:  [id number]  Date Initiated:  06/21/2013  Documentation initiated by:  [facility]  Subjective/Objective Assessment:   adm: Abscess ulceration right forefoot     Action/Plan:   discharge planning   Anticipated DC Date:  06/21/2013   Anticipated DC Plan:  HOME W HOME HEALTH SERVICES      DC Planning Services  CM consult      [facility] Choice  HOME HEALTH   Choice offered to / List presented to:  C-1 Patient        HH arranged  HH-1 RN  HH-2 PT  HH-3 OT      [facility] agency  [facility]   Status of service:  Completed, signed off Medicare Important Message given?   (If response is "NO", the following Medicare IM given date fields will be blank) Date Medicare IM given:   Date Additional Medicare IM given:    Discharge Disposition:  HOME W HOME HEALTH SERVICES  Per UR Regulation:    If discussed at Long Length of Stay Meetings, dates discussed:    Comments:  06/21/13 14:50 RN called to state a [place] representative stopped by the room earlier in the week and pt understood she was to have [facility] services from [place].  Referral was faxed to [facility] with facesheet, orders, H&P, PT eval, DC summary (no [employer] rep on call this weekend to utilize their EPIC access).  No other CM needs were communicated. Sydney [name], BSN, CM [phone].

## 2013-06-21 NOTE — Progress Notes (Signed)
D Nahser contacted concerning change in blood pressure pills day of discharge. Reports ok to wait and patient to follow up with primary doctor on Monday. Patient and patient's daughter verbalized understanding. Patient daughter concerned with metformin not being restarted at home. Dr Posey Rea notified. Order to start back to home metformin. Patient and patient's daughter given discharge instructions via Delle Reining PA on Friday 06/20/13. Patient's daughter shown dressing change and given dressings for 2 days. Patient's daughter verbalized understanding. Patient discharged at 1253 with discharge instructions from [facility] PA. Patient discharged with all belongings to home with daughter. Daughter reported Genevieve Norlander to do home health and would be out on Monday. On call case management to contact MD Plotnikov to put orders in for home health. Home health orders in.

## 2013-06-21 NOTE — Progress Notes (Signed)
Sydney [name] is a 79 y.o. female [birth year] [id number]  Subjective: No new complaints. No new problems. Slept well. Feeling OK. Ready for d/c  Objective: Vital signs in last 24 hours: Temp:  [97.5 F (36.4 C)-97.7 F (36.5 C)] 97.7 F (36.5 C) (11/08 0535) Pulse Rate:  [76-82] 76 (11/08 0535) Resp:  [18] 18 (11/08 0535) BP: (154-159)/(70-80) 159/70 mmHg (11/08 0535) SpO2:  [94 %-98 %] 95 % (11/08 0535) Weight:  [166 lb 10.7 oz (75.6 kg)] 166 lb 10.7 oz (75.6 kg) (11/08 0535) Weight change: 14.1 oz (0.4 kg) Last BM Date: 06/20/13  Intake/Output from previous day: 11/07 0701 - 11/08 0700 In: 800 [P.O.:800] Out: -  Last cbgs: CBG (last 3)   Recent Labs  06/20/13 1615 06/20/13 2032 06/21/13 0736  GLUCAP 104* 173* 157*     Physical Exam General: No apparent distress    HEENT: moist mucosa Lungs: Normal effort. Lungs clear to auscultation, no crackles or wheezes. Cardiovascular: Regular rate and rhythm, no edema Abdomen: S/NT/ND; BS(+) Musculoskeletal:  No change from before Neurological: No new neurological deficits Wounds: N/A    Skin: clear Alert, cooperative   Lab Results: BMET    Component Value Date/Time   NA 135 06/20/2013 0600   K 3.9 06/20/2013 0600   CL 98 06/20/2013 0600   CO2 25 06/20/2013 0600   GLUCOSE 144* 06/20/2013 0600   BUN 25* 06/20/2013 0600   CREATININE 1.01 06/20/2013 0600   CALCIUM 9.2 06/20/2013 0600   GFRNONAA 48* 06/20/2013 0600   GFRAA 55* 06/20/2013 0600   CBC    Component Value Date/Time   WBC 6.9 06/19/2013 0340   RBC 3.08* 06/19/2013 0340   HGB 9.4* 06/19/2013 0340   HCT 28.0* 06/19/2013 0340   PLT 324 06/19/2013 0340   MCV 90.9 06/19/2013 0340   MCH 30.5 06/19/2013 0340   MCHC 33.6 06/19/2013 0340   RDW 15.0 06/19/2013 0340   LYMPHSABS 1.2 06/10/2013 0440   MONOABS 0.7 06/10/2013 0440   EOSABS 0.1 06/10/2013 0440   BASOSABS 0.0 06/10/2013 0440    Studies/Results: No results found.  Medications: I have reviewed the patient's  current medications.  Assessment/Plan:   1. H/o A fib/ DVT Prophylaxis/Anticoagulation: Pharmaceutical: Coumadin  2. Neuropathy with phantom pain: continue neurontin bid and hs pamelor.  -this combo has worked and is tolerated so far.  3. Mood: pt in good spirits. Team to provide egosupport as required.  4. Neuropsych: This patient is capable of making decisions on her own behalf.  5. DM II:. Continue amaryl daily and use SSI for elevated BS. Metformin discontinued due to elevated Cr >=1.4 at Admission--  -follow i's and o's  -BUN better  6. Chronic anemia with superimposed ABLA:  -transfused 1u yesterday to help with dyspnea, activity tolerance  7. CV: Pleural effusions on cxr  -appreciate cardiology assistance. Lasix 20mg  po  -feeling better  -echo ok  -would recheck bmet next week  8. H/o fluid overload: lasix resumed as above.  9. COPD: Continue spiriva. Encourage IS as well as staying OOB during the day.  10. Low protein stores: continue protein supplements.  11. Wounds- continued daily dressing and pressure relief  -wearing prevalon boot religiously  -heel wound appears to have been there for some time  -needs follow up with ortho next week  D/c home   Length of stay, days: 12  Sonda Primes , MD 06/21/2013, 7:56 AM

## 2013-06-21 NOTE — Progress Notes (Signed)
ANTICOAGULATION CONSULT NOTE - Follow Up Consult  Pharmacy Consult for warfarin Indication: atrial fibrillation  Allergies  Allergen Reactions  . Other     Narcotics=makes her very disoriented  . Percocet [Oxycodone-Acetaminophen] Other (See Comments)    Dilerium, hallucinations  . Tramadol Anxiety  . Vicodin [Hydrocodone-Acetaminophen] Anxiety    Patient Measurements: Height: 5\' 5"  (165.1 cm) Weight: 166 lb 10.7 oz (75.6 kg) IBW/kg (Calculated) : 57   Vital Signs: Temp: 97.7 F (36.5 C) (11/08 0535) Temp src: Oral (11/08 0535) BP: 159/70 mmHg (11/08 0535) Pulse Rate: 76 (11/08 0535)  Labs:  Recent Labs  06/18/13 1444 06/19/13 0340 06/20/13 0600 06/21/13 0615  HGB 8.0* 9.4*  --   --   HCT 24.6* 28.0*  --   --   PLT 348 324  --   --   LABPROT  --  26.7* 26.3* 27.8*  INR  --  2.57* 2.52* 2.71*  CREATININE  --  1.05 1.01  --     Estimated Creatinine Clearance: 37.6 ml/min (by C-G formula based on Cr of 1.01).   Assessment: 89 YOF continues on warfarin for AFib. INR this morning still in range at 2.71. She was resumed on her home dose of warfarin of 4mg  daily except 2mg  MWF. CBC stable, no bleeding noted. She is being d/c'd today.  Goal of Therapy:  INR 2-3 Monitor platelets by anticoagulation protocol: Yes   Plan:  1. Recommend continuing warfarin 4mg  daily except 2mg  MWF 2. Recommend next INR check around Wednesday 11/12  Swayze Kozuch D. Kamrynn Melott, PharmD Clinical Pharmacist Pager: [phone] 06/21/2013 9:00 AM

## 2013-06-21 NOTE — Plan of Care (Signed)
Problem: RH SKIN INTEGRITY Goal: RH STG MAINTAIN SKIN INTEGRITY WITH ASSISTANCE STG Maintain Skin Integrity With Max Assistance.  Outcome: Completed/Met Date Met:  06/21/13 Demonstrated dressing change to patient's daughter with patient able to talk through

## 2013-06-21 NOTE — Progress Notes (Signed)
    Subjective:  Denies CP or dyspnea   Objective:  Filed Vitals:   06/20/13 1229 06/20/13 1630 06/21/13 0535 06/21/13 0919  BP:  154/80 159/70   Pulse:  82 76   Temp:  97.5 F (36.4 C) 97.7 F (36.5 C)   TempSrc:  Oral Oral   Resp:  18 18   Height:      Weight:   166 lb 10.7 oz (75.6 kg)   SpO2: 98% 94% 95% 98%    Intake/Output from previous day:  Intake/Output Summary (Last 24 hours) at 06/21/13 1019 Last data filed at 06/21/13 0830  Gross per 24 hour  Intake    680 ml  Output      0 ml  Net    680 ml    Physical Exam: Physical exam: Well-developed well-nourished in no acute distress.  Skin is warm and dry.  HEENT is normal.  Neck is supple.  Chest is clear to auscultation with normal expansion.  Cardiovascular exam is irregular, 2/6 systolic murmur LSB Abdominal exam nontender or distended. No masses palpated. Extremities s/p left AKA; s/p right mid foot amputation neuro grossly intact    Lab Results: Basic Metabolic Panel:  Recent Labs  40/98/11 0340 06/20/13 0600  NA 141 135  K 3.8 3.9  CL 102 98  CO2 26 25  GLUCOSE 118* 144*  BUN 24* 25*  CREATININE 1.05 1.01  CALCIUM 9.4 9.2   CBC:  Recent Labs  06/18/13 1444 06/19/13 0340  WBC 8.0 6.9  HGB 8.0* 9.4*  HCT 24.6* 28.0*  MCV 93.5 90.9  PLT 348 324     Assessment/Plan:  1 acute diastolic congestive heart failure-volume status improving; continue lasix 20 mg po daily; follow renal function. Echo shows normal LV function.  BP is a bit elevated. As OP, she was on Zestoretic 20/25.  Will start with 1/2 that dose and follow BP.  2 atrial fibrillation-continue Coumadin. Rate is controlled on no medications.   INR is 2.7  3 status post right midfoot amputation-management per orthopedics.   4 acute blood loss anemia-Follow hemoglobin. Some improvement following transfusion.   Vesta Mixer, Montez Hageman., MD, [facility] 06/21/2013, 10:23 AM Office - [phone] Pager [phone]

## 2013-06-22 DIAGNOSIS — Z48812 Encounter for surgical aftercare following surgery on the circulatory system: Secondary | ICD-10-CM | POA: Diagnosis not present

## 2013-06-22 DIAGNOSIS — L89609 Pressure ulcer of unspecified heel, unspecified stage: Secondary | ICD-10-CM | POA: Diagnosis not present

## 2013-06-22 DIAGNOSIS — I798 Other disorders of arteries, arterioles and capillaries in diseases classified elsewhere: Secondary | ICD-10-CM | POA: Diagnosis not present

## 2013-06-22 DIAGNOSIS — E1159 Type 2 diabetes mellitus with other circulatory complications: Secondary | ICD-10-CM | POA: Diagnosis not present

## 2013-06-22 DIAGNOSIS — J441 Chronic obstructive pulmonary disease with (acute) exacerbation: Secondary | ICD-10-CM | POA: Diagnosis not present

## 2013-06-22 DIAGNOSIS — L8995 Pressure ulcer of unspecified site, unstageable: Secondary | ICD-10-CM | POA: Diagnosis not present

## 2013-06-23 ENCOUNTER — Telehealth: Payer: Self-pay | Admitting: *Deleted

## 2013-06-23 DIAGNOSIS — E1159 Type 2 diabetes mellitus with other circulatory complications: Secondary | ICD-10-CM | POA: Diagnosis not present

## 2013-06-23 DIAGNOSIS — L89609 Pressure ulcer of unspecified heel, unspecified stage: Secondary | ICD-10-CM | POA: Diagnosis not present

## 2013-06-23 DIAGNOSIS — Z48812 Encounter for surgical aftercare following surgery on the circulatory system: Secondary | ICD-10-CM | POA: Diagnosis not present

## 2013-06-23 DIAGNOSIS — I798 Other disorders of arteries, arterioles and capillaries in diseases classified elsewhere: Secondary | ICD-10-CM | POA: Diagnosis not present

## 2013-06-23 DIAGNOSIS — L8995 Pressure ulcer of unspecified site, unstageable: Secondary | ICD-10-CM | POA: Diagnosis not present

## 2013-06-23 DIAGNOSIS — J441 Chronic obstructive pulmonary disease with (acute) exacerbation: Secondary | ICD-10-CM | POA: Diagnosis not present

## 2013-06-23 NOTE — Progress Notes (Signed)
Social Work Discharge Note  The overall goal for the admission was met for:   Discharge location: Yes - pt went to her home with 24/7 care from her family  Length of Stay: Yes - 12 days  Discharge activity level: Yes - minimal assistance  Home/community participation: Yes  Services provided included: MD, RD, PT, OT, RN, SW and Pharmacy  Financial Services: Medicare and Other: [employer]  Follow-up services arranged: Home Health: [facility], DME: Elevated leg rests and Patient/Family request agency HH: [facility], DME: [facility]  Comments (or additional information):  Pt and dtr have community resource packet for extra support at home.  Patient/Family verbalized understanding of follow-up arrangements: Yes  Individual responsible for coordination of the follow-up plan:  Raynelle Dick, pt's dtr, with support from other family members  Confirmed correct DME delivered: Elvera Lennox 06/23/2013    Rashae Rother, [place]

## 2013-06-23 NOTE — Telephone Encounter (Signed)
ToRoma Schanz Fax: [phone] From: [facility] Date/ Time: 06/21/2013 9:46 AM Taken By: Gerri Spore, CSR Caller: Jacqulyn Bath Facility: [facility] Patient: Sydney [name], Sydney [name] DOB: 18-May-1921 Phone: [phone] Reason for Call: Jacqulyn Bath is calling from [facility] regarding a consult for patient Serrina Minogue. Regarding Appointment: Appt Date: Appt Time: Unknown Provider: Reason: Details:

## 2013-06-24 ENCOUNTER — Other Ambulatory Visit: Payer: Self-pay | Admitting: Physical Medicine & Rehabilitation

## 2013-06-24 DIAGNOSIS — I798 Other disorders of arteries, arterioles and capillaries in diseases classified elsewhere: Secondary | ICD-10-CM | POA: Diagnosis not present

## 2013-06-24 DIAGNOSIS — E1159 Type 2 diabetes mellitus with other circulatory complications: Secondary | ICD-10-CM | POA: Diagnosis not present

## 2013-06-24 DIAGNOSIS — J441 Chronic obstructive pulmonary disease with (acute) exacerbation: Secondary | ICD-10-CM | POA: Diagnosis not present

## 2013-06-24 DIAGNOSIS — L8995 Pressure ulcer of unspecified site, unstageable: Secondary | ICD-10-CM | POA: Diagnosis not present

## 2013-06-24 DIAGNOSIS — Z48812 Encounter for surgical aftercare following surgery on the circulatory system: Secondary | ICD-10-CM | POA: Diagnosis not present

## 2013-06-24 DIAGNOSIS — L89609 Pressure ulcer of unspecified heel, unspecified stage: Secondary | ICD-10-CM | POA: Diagnosis not present

## 2013-06-25 DIAGNOSIS — L8995 Pressure ulcer of unspecified site, unstageable: Secondary | ICD-10-CM | POA: Diagnosis not present

## 2013-06-25 DIAGNOSIS — L89609 Pressure ulcer of unspecified heel, unspecified stage: Secondary | ICD-10-CM | POA: Diagnosis not present

## 2013-06-25 DIAGNOSIS — J441 Chronic obstructive pulmonary disease with (acute) exacerbation: Secondary | ICD-10-CM | POA: Diagnosis not present

## 2013-06-25 DIAGNOSIS — E1159 Type 2 diabetes mellitus with other circulatory complications: Secondary | ICD-10-CM | POA: Diagnosis not present

## 2013-06-25 DIAGNOSIS — Z48812 Encounter for surgical aftercare following surgery on the circulatory system: Secondary | ICD-10-CM | POA: Diagnosis not present

## 2013-06-25 DIAGNOSIS — I798 Other disorders of arteries, arterioles and capillaries in diseases classified elsewhere: Secondary | ICD-10-CM | POA: Diagnosis not present

## 2013-06-26 ENCOUNTER — Other Ambulatory Visit: Payer: Self-pay | Admitting: Physical Medicine & Rehabilitation

## 2013-06-26 DIAGNOSIS — E1159 Type 2 diabetes mellitus with other circulatory complications: Secondary | ICD-10-CM | POA: Diagnosis not present

## 2013-06-26 DIAGNOSIS — E1169 Type 2 diabetes mellitus with other specified complication: Secondary | ICD-10-CM | POA: Diagnosis not present

## 2013-06-26 DIAGNOSIS — J441 Chronic obstructive pulmonary disease with (acute) exacerbation: Secondary | ICD-10-CM | POA: Diagnosis not present

## 2013-06-26 DIAGNOSIS — L89609 Pressure ulcer of unspecified heel, unspecified stage: Secondary | ICD-10-CM | POA: Diagnosis not present

## 2013-06-26 DIAGNOSIS — Z48812 Encounter for surgical aftercare following surgery on the circulatory system: Secondary | ICD-10-CM | POA: Diagnosis not present

## 2013-06-26 DIAGNOSIS — I798 Other disorders of arteries, arterioles and capillaries in diseases classified elsewhere: Secondary | ICD-10-CM | POA: Diagnosis not present

## 2013-06-26 DIAGNOSIS — L8995 Pressure ulcer of unspecified site, unstageable: Secondary | ICD-10-CM | POA: Diagnosis not present

## 2013-06-26 DIAGNOSIS — I10 Essential (primary) hypertension: Secondary | ICD-10-CM | POA: Diagnosis not present

## 2013-06-27 DIAGNOSIS — J441 Chronic obstructive pulmonary disease with (acute) exacerbation: Secondary | ICD-10-CM | POA: Diagnosis not present

## 2013-06-27 DIAGNOSIS — E1159 Type 2 diabetes mellitus with other circulatory complications: Secondary | ICD-10-CM | POA: Diagnosis not present

## 2013-06-27 DIAGNOSIS — L89609 Pressure ulcer of unspecified heel, unspecified stage: Secondary | ICD-10-CM | POA: Diagnosis not present

## 2013-06-27 DIAGNOSIS — I798 Other disorders of arteries, arterioles and capillaries in diseases classified elsewhere: Secondary | ICD-10-CM | POA: Diagnosis not present

## 2013-06-27 DIAGNOSIS — Z48812 Encounter for surgical aftercare following surgery on the circulatory system: Secondary | ICD-10-CM | POA: Diagnosis not present

## 2013-06-27 DIAGNOSIS — L8995 Pressure ulcer of unspecified site, unstageable: Secondary | ICD-10-CM | POA: Diagnosis not present

## 2013-06-30 DIAGNOSIS — J441 Chronic obstructive pulmonary disease with (acute) exacerbation: Secondary | ICD-10-CM | POA: Diagnosis not present

## 2013-06-30 DIAGNOSIS — I798 Other disorders of arteries, arterioles and capillaries in diseases classified elsewhere: Secondary | ICD-10-CM | POA: Diagnosis not present

## 2013-06-30 DIAGNOSIS — L89609 Pressure ulcer of unspecified heel, unspecified stage: Secondary | ICD-10-CM | POA: Diagnosis not present

## 2013-06-30 DIAGNOSIS — E1159 Type 2 diabetes mellitus with other circulatory complications: Secondary | ICD-10-CM | POA: Diagnosis not present

## 2013-06-30 DIAGNOSIS — Z48812 Encounter for surgical aftercare following surgery on the circulatory system: Secondary | ICD-10-CM | POA: Diagnosis not present

## 2013-06-30 DIAGNOSIS — L8995 Pressure ulcer of unspecified site, unstageable: Secondary | ICD-10-CM | POA: Diagnosis not present

## 2013-07-01 DIAGNOSIS — L8995 Pressure ulcer of unspecified site, unstageable: Secondary | ICD-10-CM | POA: Diagnosis not present

## 2013-07-01 DIAGNOSIS — E1159 Type 2 diabetes mellitus with other circulatory complications: Secondary | ICD-10-CM | POA: Diagnosis not present

## 2013-07-01 DIAGNOSIS — I798 Other disorders of arteries, arterioles and capillaries in diseases classified elsewhere: Secondary | ICD-10-CM | POA: Diagnosis not present

## 2013-07-01 DIAGNOSIS — L89609 Pressure ulcer of unspecified heel, unspecified stage: Secondary | ICD-10-CM | POA: Diagnosis not present

## 2013-07-01 DIAGNOSIS — J441 Chronic obstructive pulmonary disease with (acute) exacerbation: Secondary | ICD-10-CM | POA: Diagnosis not present

## 2013-07-01 DIAGNOSIS — Z48812 Encounter for surgical aftercare following surgery on the circulatory system: Secondary | ICD-10-CM | POA: Diagnosis not present

## 2013-07-02 DIAGNOSIS — J441 Chronic obstructive pulmonary disease with (acute) exacerbation: Secondary | ICD-10-CM | POA: Diagnosis not present

## 2013-07-02 DIAGNOSIS — Z48812 Encounter for surgical aftercare following surgery on the circulatory system: Secondary | ICD-10-CM | POA: Diagnosis not present

## 2013-07-02 DIAGNOSIS — E1159 Type 2 diabetes mellitus with other circulatory complications: Secondary | ICD-10-CM | POA: Diagnosis not present

## 2013-07-02 DIAGNOSIS — L89609 Pressure ulcer of unspecified heel, unspecified stage: Secondary | ICD-10-CM | POA: Diagnosis not present

## 2013-07-02 DIAGNOSIS — I798 Other disorders of arteries, arterioles and capillaries in diseases classified elsewhere: Secondary | ICD-10-CM | POA: Diagnosis not present

## 2013-07-02 DIAGNOSIS — L8995 Pressure ulcer of unspecified site, unstageable: Secondary | ICD-10-CM | POA: Diagnosis not present

## 2013-07-04 DIAGNOSIS — E1159 Type 2 diabetes mellitus with other circulatory complications: Secondary | ICD-10-CM | POA: Diagnosis not present

## 2013-07-04 DIAGNOSIS — L89609 Pressure ulcer of unspecified heel, unspecified stage: Secondary | ICD-10-CM | POA: Diagnosis not present

## 2013-07-04 DIAGNOSIS — Z48812 Encounter for surgical aftercare following surgery on the circulatory system: Secondary | ICD-10-CM | POA: Diagnosis not present

## 2013-07-04 DIAGNOSIS — I798 Other disorders of arteries, arterioles and capillaries in diseases classified elsewhere: Secondary | ICD-10-CM | POA: Diagnosis not present

## 2013-07-04 DIAGNOSIS — L8995 Pressure ulcer of unspecified site, unstageable: Secondary | ICD-10-CM | POA: Diagnosis not present

## 2013-07-04 DIAGNOSIS — J441 Chronic obstructive pulmonary disease with (acute) exacerbation: Secondary | ICD-10-CM | POA: Diagnosis not present

## 2013-07-07 DIAGNOSIS — L8995 Pressure ulcer of unspecified site, unstageable: Secondary | ICD-10-CM | POA: Diagnosis not present

## 2013-07-07 DIAGNOSIS — E039 Hypothyroidism, unspecified: Secondary | ICD-10-CM | POA: Diagnosis not present

## 2013-07-07 DIAGNOSIS — I739 Peripheral vascular disease, unspecified: Secondary | ICD-10-CM | POA: Diagnosis not present

## 2013-07-07 DIAGNOSIS — E1159 Type 2 diabetes mellitus with other circulatory complications: Secondary | ICD-10-CM | POA: Diagnosis not present

## 2013-07-07 DIAGNOSIS — I509 Heart failure, unspecified: Secondary | ICD-10-CM | POA: Diagnosis not present

## 2013-07-07 DIAGNOSIS — S88119A Complete traumatic amputation at level between knee and ankle, unspecified lower leg, initial encounter: Secondary | ICD-10-CM | POA: Diagnosis not present

## 2013-07-07 DIAGNOSIS — J441 Chronic obstructive pulmonary disease with (acute) exacerbation: Secondary | ICD-10-CM | POA: Diagnosis not present

## 2013-07-07 DIAGNOSIS — I798 Other disorders of arteries, arterioles and capillaries in diseases classified elsewhere: Secondary | ICD-10-CM | POA: Diagnosis not present

## 2013-07-07 DIAGNOSIS — I10 Essential (primary) hypertension: Secondary | ICD-10-CM | POA: Diagnosis not present

## 2013-07-07 DIAGNOSIS — L89609 Pressure ulcer of unspecified heel, unspecified stage: Secondary | ICD-10-CM | POA: Diagnosis not present

## 2013-07-07 DIAGNOSIS — Z48812 Encounter for surgical aftercare following surgery on the circulatory system: Secondary | ICD-10-CM | POA: Diagnosis not present

## 2013-07-07 DIAGNOSIS — I4891 Unspecified atrial fibrillation: Secondary | ICD-10-CM | POA: Diagnosis not present

## 2013-07-08 DIAGNOSIS — J441 Chronic obstructive pulmonary disease with (acute) exacerbation: Secondary | ICD-10-CM | POA: Diagnosis not present

## 2013-07-08 DIAGNOSIS — L8995 Pressure ulcer of unspecified site, unstageable: Secondary | ICD-10-CM | POA: Diagnosis not present

## 2013-07-08 DIAGNOSIS — E1159 Type 2 diabetes mellitus with other circulatory complications: Secondary | ICD-10-CM | POA: Diagnosis not present

## 2013-07-08 DIAGNOSIS — L89609 Pressure ulcer of unspecified heel, unspecified stage: Secondary | ICD-10-CM | POA: Diagnosis not present

## 2013-07-08 DIAGNOSIS — Z48812 Encounter for surgical aftercare following surgery on the circulatory system: Secondary | ICD-10-CM | POA: Diagnosis not present

## 2013-07-08 DIAGNOSIS — I798 Other disorders of arteries, arterioles and capillaries in diseases classified elsewhere: Secondary | ICD-10-CM | POA: Diagnosis not present

## 2013-07-11 DIAGNOSIS — E1159 Type 2 diabetes mellitus with other circulatory complications: Secondary | ICD-10-CM | POA: Diagnosis not present

## 2013-07-11 DIAGNOSIS — L8995 Pressure ulcer of unspecified site, unstageable: Secondary | ICD-10-CM | POA: Diagnosis not present

## 2013-07-11 DIAGNOSIS — Z48812 Encounter for surgical aftercare following surgery on the circulatory system: Secondary | ICD-10-CM | POA: Diagnosis not present

## 2013-07-11 DIAGNOSIS — L89609 Pressure ulcer of unspecified heel, unspecified stage: Secondary | ICD-10-CM | POA: Diagnosis not present

## 2013-07-11 DIAGNOSIS — J441 Chronic obstructive pulmonary disease with (acute) exacerbation: Secondary | ICD-10-CM | POA: Diagnosis not present

## 2013-07-11 DIAGNOSIS — I798 Other disorders of arteries, arterioles and capillaries in diseases classified elsewhere: Secondary | ICD-10-CM | POA: Diagnosis not present

## 2013-07-14 DIAGNOSIS — Z48812 Encounter for surgical aftercare following surgery on the circulatory system: Secondary | ICD-10-CM | POA: Diagnosis not present

## 2013-07-14 DIAGNOSIS — I798 Other disorders of arteries, arterioles and capillaries in diseases classified elsewhere: Secondary | ICD-10-CM | POA: Diagnosis not present

## 2013-07-14 DIAGNOSIS — E1159 Type 2 diabetes mellitus with other circulatory complications: Secondary | ICD-10-CM | POA: Diagnosis not present

## 2013-07-14 DIAGNOSIS — J441 Chronic obstructive pulmonary disease with (acute) exacerbation: Secondary | ICD-10-CM | POA: Diagnosis not present

## 2013-07-14 DIAGNOSIS — L8995 Pressure ulcer of unspecified site, unstageable: Secondary | ICD-10-CM | POA: Diagnosis not present

## 2013-07-14 DIAGNOSIS — L89609 Pressure ulcer of unspecified heel, unspecified stage: Secondary | ICD-10-CM | POA: Diagnosis not present

## 2013-07-15 DIAGNOSIS — Z48812 Encounter for surgical aftercare following surgery on the circulatory system: Secondary | ICD-10-CM | POA: Diagnosis not present

## 2013-07-15 DIAGNOSIS — L8995 Pressure ulcer of unspecified site, unstageable: Secondary | ICD-10-CM | POA: Diagnosis not present

## 2013-07-15 DIAGNOSIS — I798 Other disorders of arteries, arterioles and capillaries in diseases classified elsewhere: Secondary | ICD-10-CM | POA: Diagnosis not present

## 2013-07-15 DIAGNOSIS — E1159 Type 2 diabetes mellitus with other circulatory complications: Secondary | ICD-10-CM | POA: Diagnosis not present

## 2013-07-15 DIAGNOSIS — J441 Chronic obstructive pulmonary disease with (acute) exacerbation: Secondary | ICD-10-CM | POA: Diagnosis not present

## 2013-07-15 DIAGNOSIS — L89609 Pressure ulcer of unspecified heel, unspecified stage: Secondary | ICD-10-CM | POA: Diagnosis not present

## 2013-07-16 ENCOUNTER — Telehealth: Payer: Self-pay | Admitting: Cardiology

## 2013-07-16 DIAGNOSIS — Z48812 Encounter for surgical aftercare following surgery on the circulatory system: Secondary | ICD-10-CM | POA: Diagnosis not present

## 2013-07-16 DIAGNOSIS — L8995 Pressure ulcer of unspecified site, unstageable: Secondary | ICD-10-CM | POA: Diagnosis not present

## 2013-07-16 DIAGNOSIS — J441 Chronic obstructive pulmonary disease with (acute) exacerbation: Secondary | ICD-10-CM | POA: Diagnosis not present

## 2013-07-16 DIAGNOSIS — I798 Other disorders of arteries, arterioles and capillaries in diseases classified elsewhere: Secondary | ICD-10-CM | POA: Diagnosis not present

## 2013-07-16 DIAGNOSIS — E1159 Type 2 diabetes mellitus with other circulatory complications: Secondary | ICD-10-CM | POA: Diagnosis not present

## 2013-07-16 DIAGNOSIS — L89609 Pressure ulcer of unspecified heel, unspecified stage: Secondary | ICD-10-CM | POA: Diagnosis not present

## 2013-07-16 MED ORDER — FUROSEMIDE 20 MG PO TABS: 20.0000 mg | ORAL_TABLET | Freq: Every day | ORAL | Status: DC

## 2013-07-16 NOTE — Telephone Encounter (Signed)
Spoke with pt dtr-n-law, aware script sent to pharm.

## 2013-07-16 NOTE — Telephone Encounter (Signed)
New Problem  Pt is taking Fuorsemide// needs refill for new dosage // Med is now taken daily// Please refill at new dosage and send to   [facility] on [address] [phone]

## 2013-07-18 DIAGNOSIS — I798 Other disorders of arteries, arterioles and capillaries in diseases classified elsewhere: Secondary | ICD-10-CM | POA: Diagnosis not present

## 2013-07-18 DIAGNOSIS — J441 Chronic obstructive pulmonary disease with (acute) exacerbation: Secondary | ICD-10-CM | POA: Diagnosis not present

## 2013-07-18 DIAGNOSIS — Z48812 Encounter for surgical aftercare following surgery on the circulatory system: Secondary | ICD-10-CM | POA: Diagnosis not present

## 2013-07-18 DIAGNOSIS — E1159 Type 2 diabetes mellitus with other circulatory complications: Secondary | ICD-10-CM | POA: Diagnosis not present

## 2013-07-18 DIAGNOSIS — L89609 Pressure ulcer of unspecified heel, unspecified stage: Secondary | ICD-10-CM | POA: Diagnosis not present

## 2013-07-18 DIAGNOSIS — L8995 Pressure ulcer of unspecified site, unstageable: Secondary | ICD-10-CM | POA: Diagnosis not present

## 2013-07-21 DIAGNOSIS — L8995 Pressure ulcer of unspecified site, unstageable: Secondary | ICD-10-CM | POA: Diagnosis not present

## 2013-07-21 DIAGNOSIS — Z48812 Encounter for surgical aftercare following surgery on the circulatory system: Secondary | ICD-10-CM | POA: Diagnosis not present

## 2013-07-21 DIAGNOSIS — E1159 Type 2 diabetes mellitus with other circulatory complications: Secondary | ICD-10-CM | POA: Diagnosis not present

## 2013-07-21 DIAGNOSIS — I798 Other disorders of arteries, arterioles and capillaries in diseases classified elsewhere: Secondary | ICD-10-CM | POA: Diagnosis not present

## 2013-07-21 DIAGNOSIS — J441 Chronic obstructive pulmonary disease with (acute) exacerbation: Secondary | ICD-10-CM | POA: Diagnosis not present

## 2013-07-21 DIAGNOSIS — L89609 Pressure ulcer of unspecified heel, unspecified stage: Secondary | ICD-10-CM | POA: Diagnosis not present

## 2013-07-22 DIAGNOSIS — I798 Other disorders of arteries, arterioles and capillaries in diseases classified elsewhere: Secondary | ICD-10-CM | POA: Diagnosis not present

## 2013-07-22 DIAGNOSIS — Z48812 Encounter for surgical aftercare following surgery on the circulatory system: Secondary | ICD-10-CM | POA: Diagnosis not present

## 2013-07-22 DIAGNOSIS — E1159 Type 2 diabetes mellitus with other circulatory complications: Secondary | ICD-10-CM | POA: Diagnosis not present

## 2013-07-22 DIAGNOSIS — L8995 Pressure ulcer of unspecified site, unstageable: Secondary | ICD-10-CM | POA: Diagnosis not present

## 2013-07-22 DIAGNOSIS — J441 Chronic obstructive pulmonary disease with (acute) exacerbation: Secondary | ICD-10-CM | POA: Diagnosis not present

## 2013-07-22 DIAGNOSIS — L89609 Pressure ulcer of unspecified heel, unspecified stage: Secondary | ICD-10-CM | POA: Diagnosis not present

## 2013-07-23 ENCOUNTER — Encounter: Payer: Medicare Other | Admitting: Physical Medicine & Rehabilitation

## 2013-07-23 DIAGNOSIS — E1159 Type 2 diabetes mellitus with other circulatory complications: Secondary | ICD-10-CM | POA: Diagnosis not present

## 2013-07-23 DIAGNOSIS — J441 Chronic obstructive pulmonary disease with (acute) exacerbation: Secondary | ICD-10-CM | POA: Diagnosis not present

## 2013-07-23 DIAGNOSIS — L89609 Pressure ulcer of unspecified heel, unspecified stage: Secondary | ICD-10-CM | POA: Diagnosis not present

## 2013-07-23 DIAGNOSIS — Z48812 Encounter for surgical aftercare following surgery on the circulatory system: Secondary | ICD-10-CM | POA: Diagnosis not present

## 2013-07-23 DIAGNOSIS — L8995 Pressure ulcer of unspecified site, unstageable: Secondary | ICD-10-CM | POA: Diagnosis not present

## 2013-07-23 DIAGNOSIS — I798 Other disorders of arteries, arterioles and capillaries in diseases classified elsewhere: Secondary | ICD-10-CM | POA: Diagnosis not present

## 2013-07-24 DIAGNOSIS — L8995 Pressure ulcer of unspecified site, unstageable: Secondary | ICD-10-CM | POA: Diagnosis not present

## 2013-07-24 DIAGNOSIS — E1159 Type 2 diabetes mellitus with other circulatory complications: Secondary | ICD-10-CM | POA: Diagnosis not present

## 2013-07-24 DIAGNOSIS — Z48812 Encounter for surgical aftercare following surgery on the circulatory system: Secondary | ICD-10-CM | POA: Diagnosis not present

## 2013-07-24 DIAGNOSIS — I798 Other disorders of arteries, arterioles and capillaries in diseases classified elsewhere: Secondary | ICD-10-CM | POA: Diagnosis not present

## 2013-07-24 DIAGNOSIS — J441 Chronic obstructive pulmonary disease with (acute) exacerbation: Secondary | ICD-10-CM | POA: Diagnosis not present

## 2013-07-24 DIAGNOSIS — L89609 Pressure ulcer of unspecified heel, unspecified stage: Secondary | ICD-10-CM | POA: Diagnosis not present

## 2013-07-25 ENCOUNTER — Ambulatory Visit (INDEPENDENT_AMBULATORY_CARE_PROVIDER_SITE_OTHER): Payer: Medicare Other | Admitting: Cardiology

## 2013-07-25 ENCOUNTER — Encounter: Payer: Self-pay | Admitting: Cardiology

## 2013-07-25 VITALS — BP 139/70 | HR 81 | Ht 65.0 in

## 2013-07-25 DIAGNOSIS — I4891 Unspecified atrial fibrillation: Secondary | ICD-10-CM

## 2013-07-25 DIAGNOSIS — I10 Essential (primary) hypertension: Secondary | ICD-10-CM | POA: Diagnosis not present

## 2013-07-25 DIAGNOSIS — R0602 Shortness of breath: Secondary | ICD-10-CM | POA: Diagnosis not present

## 2013-07-25 DIAGNOSIS — I5031 Acute diastolic (congestive) heart failure: Secondary | ICD-10-CM

## 2013-07-25 LAB — BASIC METABOLIC PANEL
CO2: 31 mEq/L (ref 19–32)
Chloride: 97 mEq/L (ref 96–112)
Potassium: 3.8 mEq/L (ref 3.5–5.1)
Sodium: 136 mEq/L (ref 135–145)

## 2013-07-25 MED ORDER — FUROSEMIDE 20 MG PO TABS: ORAL_TABLET | ORAL | Status: DC

## 2013-07-25 NOTE — Patient Instructions (Signed)
Your physician recommends that you schedule a follow-up appointment in: 8 WEEKS WITH DR CRENSHAW  INCREASE FUROSEMIDE TO 40 MG =2 20 MG TABLETS= ONCE DAILY   Your physician recommends that you HAVE LAB WORK TODAY AND IN ONE WEEK

## 2013-07-25 NOTE — Progress Notes (Signed)
HPI: FU permanent atrial fibrillation. Echocardiogram in November 2014 showed normal LV function,mild aortic insufficiency and mitral regurgitation and mild right atrial enlargement. Holter monitor in February of 2010 showed A. fib with controlled ventricular response. Myoview in June of 2008 showed normal LV function with an ejection fraction of 63% and normal perfusion. Patient also has peripheral vascular disease. ABIs in January of 2013 showed greater than 50% bilateral SFA stenosis. Patient admitted in October for right midfoot amputation because of peripheral vascular disease. She developed congestive heart failure and was diuresed. Since discharge, she notes some dyspnea and orthopnea. Some pain with transferring from bed to chair in her chest.   Current Outpatient Prescriptions  Medication Sig Dispense Refill  . acetaminophen (TYLENOL) 325 MG tablet Take 1-2 tablets (325-650 mg total) by mouth every 4 (four) hours as needed.      Marland Kitchen albuterol (PROVENTIL HFA;VENTOLIN HFA) 108 (90 BASE) MCG/ACT inhaler Inhale 1 puff into the lungs every 6 (six) hours as needed for wheezing or shortness of breath (or coughing).  1 Inhaler  0  . calcium carbonate (OS-CAL) 600 MG TABS Take 600 mg by mouth daily.      . Calcium-Vitamin D-Vitamin K ([facility] CALCIUM SOFT CHEWS PO) Take 2 tablets by mouth daily.      . Cholecalciferol (VITAMIN D3) 400 UNITS CAPS Take 400 Units by mouth daily.       . cyanocobalamin 500 MCG tablet Take 500 mcg by mouth daily.      . ferrous fumarate-iron polysaccharide complex (TANDEM) 162-115.2 MG CAPS Take 1 capsule by mouth 2 (two) times daily.  60 capsule  1  . fish oil-omega-3 fatty acids 1000 MG capsule Take 1 g by mouth daily.      . fluticasone (FLONASE) 50 MCG/ACT nasal spray Place 1 spray into both nostrils daily as needed for allergies or rhinitis. Use as needed for nasal congestion.  16 g  1  . folic acid (FOLVITE) 1 MG tablet Take 1 mg by mouth daily.      . furosemide  (LASIX) 20 MG tablet Take 1 tablet (20 mg total) by mouth daily.  30 tablet  12  . gabapentin (NEURONTIN) 300 MG capsule Take 300 mg by mouth 2 (two) times daily.      Marland Kitchen glimepiride (AMARYL) 4 MG tablet Take 2 mg by mouth daily before breakfast.       . levothyroxine (SYNTHROID, LEVOTHROID) 50 MCG tablet Take 50 mcg by mouth daily with breakfast.       . metFORMIN (GLUCOPHAGE) 500 MG tablet Take 1 tablet by mouth daily.      . Multiple Vitamin (MULITIVITAMIN WITH MINERALS) TABS Take 1 tablet by mouth daily.      . Multiple Vitamins-Minerals (ICAPS AREDS FORMULA PO) Take 1 capsule by mouth daily with breakfast.      . nitroGLYCERIN (NITRODUR - DOSED IN MG/24 HR) 0.2 mg/hr patch Place 1 patch onto the skin at bedtime. On the foot      . nortriptyline (PAMELOR) 10 MG capsule Take 1 capsule (10 mg total) by mouth at bedtime. For neuropathy  30 capsule  1  . protein supplement (RESOURCE BENEPROTEIN) POWD Take 1 scoop by mouth 3 (three) times daily with meals.       . senna-docusate (SENOKOT-S) 8.6-50 MG per tablet Take 1 tablet by mouth as needed.      . silver sulfADIAZINE (SILVADENE) 1 % cream Apply 1 application topically daily.      Marland Kitchen  tiotropium (SPIRIVA) 18 MCG inhalation capsule Place 18 mcg into inhaler and inhale daily with breakfast.       . vitamin C (ASCORBIC ACID) 500 MG tablet Take 500 mg by mouth daily.      Marland Kitchen warfarin (COUMADIN) 4 MG tablet Take 2mg  on Monday ,Wednesday, Friday.  Take 4mg  on Tuesday, Thursday, Saturday, Sunday.       No current facility-administered medications for this visit.     Past Medical History  Diagnosis Date  . Lung disease     BRONCHOSPASTIC  . Neuropathy     takes Gabapentin bid  . Hypercholesterolemia     doesn't require meds for this  . Anemia   . COPD (chronic obstructive pulmonary disease)   . Blood transfusion ~ 2012    "once; not related to a surgery or bleeding that I remember" (06/06/2013)  . Hypertension     takes Prinizide daily  .  Chronic atrial fibrillation     takes Coumadin as instructed  . Peripheral vascular disease   . CHF (congestive heart failure)     takes Lasix every other day  . Emphysema   . History of head injury     many yrs ago  . Vaginal discharge     has seen GYN but says its nothing to worry about  . Diabetes mellitus     takes Metformin and Amaryl daily  . Hypothyroidism     takes Synthroid daily  . Macular degeneration     dry  . Bronchial pneumonia     "once; several years ago" (06/06/2013)  . Rheumatoid arthritis     "shoulders; daughter gives me shots of Methotrexate q week" (06/06/2013)    Past Surgical History  Procedure Laterality Date  . Total hip arthroplasty Left ?2013  . Back surgery    . Fracture surgery  2011    LEFT HIP FX /REPLACEMENT   . Cholecystectomy  1951  . Dilation and curettage of uterus    . Amputation  11/09/2011    Procedure: AMPUTATION DIGIT;  Surgeon: Kathryne Hitch, MD;  Location: [facility] OR;  Service: Orthopedics;  Laterality: Right;  Right Great Toe Amputation  . Amputation  01/05/2012    Procedure: AMPUTATION RAY;  Surgeon: Kathryne Hitch, MD;  Location: [facility] ORS;  Service: Orthopedics;  Laterality: Left;  Amputation Left 4th and 5th Toes  . Cardioversion    . Amputation  05/31/2012    Procedure: AMPUTATION RAY;  Surgeon: Nadara Mustard, MD;  Location: [facility] OR;  Service: Orthopedics;  Laterality: Left;  Left foot 1st ray amp  . Amputation  06/21/2012    Procedure: AMPUTATION BELOW KNEE;  Surgeon: Nadara Mustard, MD;  Location: [facility] OR;  Service: Orthopedics;  Laterality: Left;  Left Below Knee Amputation  . Foot amputation through metatarsal Right 06/06/2013    midfoot/notes 06/06/2013  . Joint replacement    . Appendectomy    . Total abdominal hysterectomy  1975  . Cervical disc surgery      "the top vertebrae" (06/06/2013)  . Cataract extraction w/ intraocular lens  implant, bilateral Bilateral   . Eye surgery      bilateral -cataract surgery    . Amputation Right 06/06/2013    Procedure: RIGHT MIDFOOT AMPUTATION;  Surgeon: Nadara Mustard, MD;  Location: [facility] OR;  Service: Orthopedics;  Laterality: Right;    History   Social History  . Marital Status: Widowed    Spouse Name: N/A  Number of Children: N/A  . Years of Education: N/A   Occupational History  . Not on file.   Social History Main Topics  . Smoking status: Former Smoker -- 4 years    Types: Cigarettes  . Smokeless tobacco: Never Used     Comment: 06/06/2013 "smoked a little in my teens"  . Alcohol Use: No  . Drug Use: No  . Sexual Activity: No   Other Topics Concern  . Not on file   Social History Narrative  . No narrative on file    ROS: no fevers or chills, productive cough, hemoptysis, dysphasia, odynophagia, melena, hematochezia, dysuria, hematuria, rash, seizure activity, orthopnea, PND, pedal edema, claudication. Remaining systems are negative.  Physical Exam: Well-developed well-nourished in no acute distress.  Skin is warm and dry.  HEENT is normal.  Neck is supple.  Chest is clear to auscultation with normal expansion.  Cardiovascular exam is irregular Abdominal exam nontender or distended. No masses palpated. Extremities status post surgery on the right and BKA on the left neuro grossly intact

## 2013-07-25 NOTE — Assessment & Plan Note (Signed)
Change Lasix to 40 mg daily. Check potassium, renal function and BNP today and repeat potassium and renal function in one week.

## 2013-07-25 NOTE — Assessment & Plan Note (Signed)
Continue Coumadin. 

## 2013-07-25 NOTE — Assessment & Plan Note (Signed)
Blood pressure controlled. Continue present medications. 

## 2013-07-28 DIAGNOSIS — I798 Other disorders of arteries, arterioles and capillaries in diseases classified elsewhere: Secondary | ICD-10-CM | POA: Diagnosis not present

## 2013-07-28 DIAGNOSIS — L89609 Pressure ulcer of unspecified heel, unspecified stage: Secondary | ICD-10-CM | POA: Diagnosis not present

## 2013-07-28 DIAGNOSIS — L8995 Pressure ulcer of unspecified site, unstageable: Secondary | ICD-10-CM | POA: Diagnosis not present

## 2013-07-28 DIAGNOSIS — E1159 Type 2 diabetes mellitus with other circulatory complications: Secondary | ICD-10-CM | POA: Diagnosis not present

## 2013-07-28 DIAGNOSIS — Z48812 Encounter for surgical aftercare following surgery on the circulatory system: Secondary | ICD-10-CM | POA: Diagnosis not present

## 2013-07-28 DIAGNOSIS — J441 Chronic obstructive pulmonary disease with (acute) exacerbation: Secondary | ICD-10-CM | POA: Diagnosis not present

## 2013-07-29 DIAGNOSIS — I798 Other disorders of arteries, arterioles and capillaries in diseases classified elsewhere: Secondary | ICD-10-CM | POA: Diagnosis not present

## 2013-07-29 DIAGNOSIS — Z48812 Encounter for surgical aftercare following surgery on the circulatory system: Secondary | ICD-10-CM | POA: Diagnosis not present

## 2013-07-29 DIAGNOSIS — L8995 Pressure ulcer of unspecified site, unstageable: Secondary | ICD-10-CM | POA: Diagnosis not present

## 2013-07-29 DIAGNOSIS — E1159 Type 2 diabetes mellitus with other circulatory complications: Secondary | ICD-10-CM | POA: Diagnosis not present

## 2013-07-29 DIAGNOSIS — J441 Chronic obstructive pulmonary disease with (acute) exacerbation: Secondary | ICD-10-CM | POA: Diagnosis not present

## 2013-07-29 DIAGNOSIS — L89609 Pressure ulcer of unspecified heel, unspecified stage: Secondary | ICD-10-CM | POA: Diagnosis not present

## 2013-07-30 DIAGNOSIS — L89609 Pressure ulcer of unspecified heel, unspecified stage: Secondary | ICD-10-CM | POA: Diagnosis not present

## 2013-07-30 DIAGNOSIS — Z48812 Encounter for surgical aftercare following surgery on the circulatory system: Secondary | ICD-10-CM | POA: Diagnosis not present

## 2013-07-30 DIAGNOSIS — I798 Other disorders of arteries, arterioles and capillaries in diseases classified elsewhere: Secondary | ICD-10-CM | POA: Diagnosis not present

## 2013-07-30 DIAGNOSIS — L8995 Pressure ulcer of unspecified site, unstageable: Secondary | ICD-10-CM | POA: Diagnosis not present

## 2013-07-30 DIAGNOSIS — J441 Chronic obstructive pulmonary disease with (acute) exacerbation: Secondary | ICD-10-CM | POA: Diagnosis not present

## 2013-07-30 DIAGNOSIS — E1159 Type 2 diabetes mellitus with other circulatory complications: Secondary | ICD-10-CM | POA: Diagnosis not present

## 2013-07-31 DIAGNOSIS — J441 Chronic obstructive pulmonary disease with (acute) exacerbation: Secondary | ICD-10-CM | POA: Diagnosis not present

## 2013-07-31 DIAGNOSIS — Z48812 Encounter for surgical aftercare following surgery on the circulatory system: Secondary | ICD-10-CM | POA: Diagnosis not present

## 2013-07-31 DIAGNOSIS — L89609 Pressure ulcer of unspecified heel, unspecified stage: Secondary | ICD-10-CM | POA: Diagnosis not present

## 2013-07-31 DIAGNOSIS — E1159 Type 2 diabetes mellitus with other circulatory complications: Secondary | ICD-10-CM | POA: Diagnosis not present

## 2013-07-31 DIAGNOSIS — I798 Other disorders of arteries, arterioles and capillaries in diseases classified elsewhere: Secondary | ICD-10-CM | POA: Diagnosis not present

## 2013-07-31 DIAGNOSIS — L8995 Pressure ulcer of unspecified site, unstageable: Secondary | ICD-10-CM | POA: Diagnosis not present

## 2013-08-04 ENCOUNTER — Encounter: Payer: Self-pay | Admitting: Cardiology

## 2013-08-04 DIAGNOSIS — L89609 Pressure ulcer of unspecified heel, unspecified stage: Secondary | ICD-10-CM | POA: Diagnosis not present

## 2013-08-04 DIAGNOSIS — L8995 Pressure ulcer of unspecified site, unstageable: Secondary | ICD-10-CM | POA: Diagnosis not present

## 2013-08-04 DIAGNOSIS — E1159 Type 2 diabetes mellitus with other circulatory complications: Secondary | ICD-10-CM | POA: Diagnosis not present

## 2013-08-04 DIAGNOSIS — I798 Other disorders of arteries, arterioles and capillaries in diseases classified elsewhere: Secondary | ICD-10-CM | POA: Diagnosis not present

## 2013-08-04 DIAGNOSIS — I509 Heart failure, unspecified: Secondary | ICD-10-CM | POA: Diagnosis not present

## 2013-08-04 DIAGNOSIS — J441 Chronic obstructive pulmonary disease with (acute) exacerbation: Secondary | ICD-10-CM | POA: Diagnosis not present

## 2013-08-04 DIAGNOSIS — Z48812 Encounter for surgical aftercare following surgery on the circulatory system: Secondary | ICD-10-CM | POA: Diagnosis not present

## 2013-08-05 ENCOUNTER — Telehealth: Payer: Self-pay | Admitting: Cardiology

## 2013-08-05 NOTE — Telephone Encounter (Signed)
New message    The BMET results were sent to you yesterday.  Want to make sure you received it.  BUN is 40, glucose is 274--everything else is ok.

## 2013-08-06 DIAGNOSIS — I798 Other disorders of arteries, arterioles and capillaries in diseases classified elsewhere: Secondary | ICD-10-CM | POA: Diagnosis not present

## 2013-08-06 DIAGNOSIS — J441 Chronic obstructive pulmonary disease with (acute) exacerbation: Secondary | ICD-10-CM | POA: Diagnosis not present

## 2013-08-06 DIAGNOSIS — L8995 Pressure ulcer of unspecified site, unstageable: Secondary | ICD-10-CM | POA: Diagnosis not present

## 2013-08-06 DIAGNOSIS — L89609 Pressure ulcer of unspecified heel, unspecified stage: Secondary | ICD-10-CM | POA: Diagnosis not present

## 2013-08-06 DIAGNOSIS — Z48812 Encounter for surgical aftercare following surgery on the circulatory system: Secondary | ICD-10-CM | POA: Diagnosis not present

## 2013-08-06 DIAGNOSIS — E1159 Type 2 diabetes mellitus with other circulatory complications: Secondary | ICD-10-CM | POA: Diagnosis not present

## 2013-08-11 DIAGNOSIS — J441 Chronic obstructive pulmonary disease with (acute) exacerbation: Secondary | ICD-10-CM | POA: Diagnosis not present

## 2013-08-11 DIAGNOSIS — L8995 Pressure ulcer of unspecified site, unstageable: Secondary | ICD-10-CM | POA: Diagnosis not present

## 2013-08-11 DIAGNOSIS — L89609 Pressure ulcer of unspecified heel, unspecified stage: Secondary | ICD-10-CM | POA: Diagnosis not present

## 2013-08-11 DIAGNOSIS — I798 Other disorders of arteries, arterioles and capillaries in diseases classified elsewhere: Secondary | ICD-10-CM | POA: Diagnosis not present

## 2013-08-11 DIAGNOSIS — Z48812 Encounter for surgical aftercare following surgery on the circulatory system: Secondary | ICD-10-CM | POA: Diagnosis not present

## 2013-08-11 DIAGNOSIS — E1159 Type 2 diabetes mellitus with other circulatory complications: Secondary | ICD-10-CM | POA: Diagnosis not present

## 2013-08-13 DIAGNOSIS — Z48812 Encounter for surgical aftercare following surgery on the circulatory system: Secondary | ICD-10-CM | POA: Diagnosis not present

## 2013-08-13 DIAGNOSIS — E1159 Type 2 diabetes mellitus with other circulatory complications: Secondary | ICD-10-CM | POA: Diagnosis not present

## 2013-08-13 DIAGNOSIS — L89609 Pressure ulcer of unspecified heel, unspecified stage: Secondary | ICD-10-CM | POA: Diagnosis not present

## 2013-08-13 DIAGNOSIS — I798 Other disorders of arteries, arterioles and capillaries in diseases classified elsewhere: Secondary | ICD-10-CM | POA: Diagnosis not present

## 2013-08-13 DIAGNOSIS — J441 Chronic obstructive pulmonary disease with (acute) exacerbation: Secondary | ICD-10-CM | POA: Diagnosis not present

## 2013-08-13 DIAGNOSIS — L8995 Pressure ulcer of unspecified site, unstageable: Secondary | ICD-10-CM | POA: Diagnosis not present

## 2013-08-20 DIAGNOSIS — Z48812 Encounter for surgical aftercare following surgery on the circulatory system: Secondary | ICD-10-CM | POA: Diagnosis not present

## 2013-08-20 DIAGNOSIS — I798 Other disorders of arteries, arterioles and capillaries in diseases classified elsewhere: Secondary | ICD-10-CM | POA: Diagnosis not present

## 2013-08-20 DIAGNOSIS — J441 Chronic obstructive pulmonary disease with (acute) exacerbation: Secondary | ICD-10-CM | POA: Diagnosis not present

## 2013-08-20 DIAGNOSIS — L8995 Pressure ulcer of unspecified site, unstageable: Secondary | ICD-10-CM | POA: Diagnosis not present

## 2013-08-20 DIAGNOSIS — E1159 Type 2 diabetes mellitus with other circulatory complications: Secondary | ICD-10-CM | POA: Diagnosis not present

## 2013-08-20 DIAGNOSIS — L89609 Pressure ulcer of unspecified heel, unspecified stage: Secondary | ICD-10-CM | POA: Diagnosis not present

## 2013-08-21 DIAGNOSIS — I798 Other disorders of arteries, arterioles and capillaries in diseases classified elsewhere: Secondary | ICD-10-CM | POA: Diagnosis not present

## 2013-08-21 DIAGNOSIS — L89609 Pressure ulcer of unspecified heel, unspecified stage: Secondary | ICD-10-CM | POA: Diagnosis not present

## 2013-08-21 DIAGNOSIS — R269 Unspecified abnormalities of gait and mobility: Secondary | ICD-10-CM | POA: Diagnosis not present

## 2013-08-21 DIAGNOSIS — E1159 Type 2 diabetes mellitus with other circulatory complications: Secondary | ICD-10-CM | POA: Diagnosis not present

## 2013-08-21 DIAGNOSIS — L8995 Pressure ulcer of unspecified site, unstageable: Secondary | ICD-10-CM | POA: Diagnosis not present

## 2013-08-21 DIAGNOSIS — J441 Chronic obstructive pulmonary disease with (acute) exacerbation: Secondary | ICD-10-CM | POA: Diagnosis not present

## 2013-08-25 DIAGNOSIS — L8995 Pressure ulcer of unspecified site, unstageable: Secondary | ICD-10-CM | POA: Diagnosis not present

## 2013-08-25 DIAGNOSIS — L89609 Pressure ulcer of unspecified heel, unspecified stage: Secondary | ICD-10-CM | POA: Diagnosis not present

## 2013-08-25 DIAGNOSIS — E1159 Type 2 diabetes mellitus with other circulatory complications: Secondary | ICD-10-CM | POA: Diagnosis not present

## 2013-08-25 DIAGNOSIS — R269 Unspecified abnormalities of gait and mobility: Secondary | ICD-10-CM | POA: Diagnosis not present

## 2013-08-25 DIAGNOSIS — I798 Other disorders of arteries, arterioles and capillaries in diseases classified elsewhere: Secondary | ICD-10-CM | POA: Diagnosis not present

## 2013-08-25 DIAGNOSIS — J441 Chronic obstructive pulmonary disease with (acute) exacerbation: Secondary | ICD-10-CM | POA: Diagnosis not present

## 2013-08-26 NOTE — Telephone Encounter (Signed)
Labs have been received

## 2013-08-28 DIAGNOSIS — M25519 Pain in unspecified shoulder: Secondary | ICD-10-CM | POA: Diagnosis not present

## 2013-08-28 DIAGNOSIS — M069 Rheumatoid arthritis, unspecified: Secondary | ICD-10-CM | POA: Diagnosis not present

## 2013-08-29 ENCOUNTER — Other Ambulatory Visit: Payer: Self-pay | Admitting: *Deleted

## 2013-08-29 ENCOUNTER — Telehealth: Payer: Self-pay | Admitting: *Deleted

## 2013-08-29 DIAGNOSIS — I5031 Acute diastolic (congestive) heart failure: Secondary | ICD-10-CM

## 2013-08-29 DIAGNOSIS — E1159 Type 2 diabetes mellitus with other circulatory complications: Secondary | ICD-10-CM | POA: Diagnosis not present

## 2013-08-29 DIAGNOSIS — L8995 Pressure ulcer of unspecified site, unstageable: Secondary | ICD-10-CM | POA: Diagnosis not present

## 2013-08-29 DIAGNOSIS — L89609 Pressure ulcer of unspecified heel, unspecified stage: Secondary | ICD-10-CM | POA: Diagnosis not present

## 2013-08-29 DIAGNOSIS — I4891 Unspecified atrial fibrillation: Secondary | ICD-10-CM

## 2013-08-29 DIAGNOSIS — R0602 Shortness of breath: Secondary | ICD-10-CM

## 2013-08-29 DIAGNOSIS — I10 Essential (primary) hypertension: Secondary | ICD-10-CM

## 2013-08-29 DIAGNOSIS — I798 Other disorders of arteries, arterioles and capillaries in diseases classified elsewhere: Secondary | ICD-10-CM | POA: Diagnosis not present

## 2013-08-29 DIAGNOSIS — J441 Chronic obstructive pulmonary disease with (acute) exacerbation: Secondary | ICD-10-CM | POA: Diagnosis not present

## 2013-08-29 DIAGNOSIS — R269 Unspecified abnormalities of gait and mobility: Secondary | ICD-10-CM | POA: Diagnosis not present

## 2013-08-29 MED ORDER — FUROSEMIDE 20 MG PO TABS: ORAL_TABLET | ORAL | Status: DC

## 2013-08-29 NOTE — Telephone Encounter (Signed)
Patients family member requested that I fax an rx for furosemide to [facility] to the attention of nurse Stanton Kidney at [phone]. Rx faxed.

## 2013-08-30 DIAGNOSIS — L8995 Pressure ulcer of unspecified site, unstageable: Secondary | ICD-10-CM

## 2013-08-30 DIAGNOSIS — I798 Other disorders of arteries, arterioles and capillaries in diseases classified elsewhere: Secondary | ICD-10-CM | POA: Diagnosis not present

## 2013-08-30 DIAGNOSIS — L89609 Pressure ulcer of unspecified heel, unspecified stage: Secondary | ICD-10-CM | POA: Diagnosis not present

## 2013-08-30 DIAGNOSIS — E1159 Type 2 diabetes mellitus with other circulatory complications: Secondary | ICD-10-CM

## 2013-08-30 DIAGNOSIS — J441 Chronic obstructive pulmonary disease with (acute) exacerbation: Secondary | ICD-10-CM | POA: Diagnosis not present

## 2013-08-30 DIAGNOSIS — Z48812 Encounter for surgical aftercare following surgery on the circulatory system: Secondary | ICD-10-CM | POA: Diagnosis not present

## 2013-09-01 DIAGNOSIS — R269 Unspecified abnormalities of gait and mobility: Secondary | ICD-10-CM | POA: Diagnosis not present

## 2013-09-01 DIAGNOSIS — J441 Chronic obstructive pulmonary disease with (acute) exacerbation: Secondary | ICD-10-CM | POA: Diagnosis not present

## 2013-09-01 DIAGNOSIS — E1159 Type 2 diabetes mellitus with other circulatory complications: Secondary | ICD-10-CM | POA: Diagnosis not present

## 2013-09-01 DIAGNOSIS — L8995 Pressure ulcer of unspecified site, unstageable: Secondary | ICD-10-CM | POA: Diagnosis not present

## 2013-09-01 DIAGNOSIS — L89609 Pressure ulcer of unspecified heel, unspecified stage: Secondary | ICD-10-CM | POA: Diagnosis not present

## 2013-09-01 DIAGNOSIS — I798 Other disorders of arteries, arterioles and capillaries in diseases classified elsewhere: Secondary | ICD-10-CM | POA: Diagnosis not present

## 2013-09-15 ENCOUNTER — Ambulatory Visit (INDEPENDENT_AMBULATORY_CARE_PROVIDER_SITE_OTHER): Payer: Medicare Other | Admitting: Cardiology

## 2013-09-15 ENCOUNTER — Encounter: Payer: Self-pay | Admitting: Cardiology

## 2013-09-15 VITALS — BP 120/70 | HR 82 | Ht 65.0 in

## 2013-09-15 DIAGNOSIS — I4891 Unspecified atrial fibrillation: Secondary | ICD-10-CM | POA: Diagnosis not present

## 2013-09-15 DIAGNOSIS — R0989 Other specified symptoms and signs involving the circulatory and respiratory systems: Secondary | ICD-10-CM | POA: Diagnosis not present

## 2013-09-15 DIAGNOSIS — N289 Disorder of kidney and ureter, unspecified: Secondary | ICD-10-CM | POA: Diagnosis not present

## 2013-09-15 DIAGNOSIS — I5031 Acute diastolic (congestive) heart failure: Secondary | ICD-10-CM | POA: Diagnosis not present

## 2013-09-15 DIAGNOSIS — R0609 Other forms of dyspnea: Secondary | ICD-10-CM

## 2013-09-15 DIAGNOSIS — I10 Essential (primary) hypertension: Secondary | ICD-10-CM

## 2013-09-15 LAB — BASIC METABOLIC PANEL
BUN: 27 mg/dL — AB (ref 6–23)
CALCIUM: 9.2 mg/dL (ref 8.4–10.5)
CO2: 31 mEq/L (ref 19–32)
CREATININE: 1.2 mg/dL (ref 0.4–1.2)
Chloride: 98 mEq/L (ref 96–112)
GFR: 46.14 mL/min — ABNORMAL LOW (ref >60.00)
Glucose, Bld: 197 mg/dL — ABNORMAL HIGH (ref 70–99)
Potassium: 3.3 mEq/L — ABNORMAL LOW (ref 3.5–5.1)
Sodium: 140 mEq/L (ref 135–145)

## 2013-09-15 LAB — BRAIN NATRIURETIC PEPTIDE: PRO B NATRI PEPTIDE: 231 pg/mL — AB (ref 0.0–100.0)

## 2013-09-15 NOTE — Progress Notes (Signed)
HPI: FU permanent atrial fibrillation. Echocardiogram in November 2014 showed normal LV function,mild aortic insufficiency and mitral regurgitation and mild right atrial enlargement. Holter monitor in February of 2010 showed A. fib with controlled ventricular response. Myoview in June of 2008 showed normal LV function with an ejection fraction of 63% and normal perfusion. Patient also has peripheral vascular disease. ABIs in January of 2013 showed greater than 50% bilateral SFA stenosis. Patient admitted in October for right midfoot amputation because of peripheral vascular disease. She developed congestive heart failure and was diuresed. When I last saw her in December of 2014 we increased her Lasix. Since then, she denies dyspnea, chest pain, palpitations or syncope. No bleeding.   Current Outpatient Prescriptions  Medication Sig Dispense Refill  . acetaminophen (TYLENOL) 325 MG tablet Take 1-2 tablets (325-650 mg total) by mouth every 4 (four) hours as needed.      Marland Kitchen albuterol (PROVENTIL HFA;VENTOLIN HFA) 108 (90 BASE) MCG/ACT inhaler Inhale 1 puff into the lungs every 6 (six) hours as needed for wheezing or shortness of breath (or coughing).  1 Inhaler  0  . calcium carbonate (OS-CAL) 600 MG TABS Take 600 mg by mouth daily.      . Calcium-Vitamin D-Vitamin K ([employer] CALCIUM SOFT CHEWS PO) Take 2 tablets by mouth daily.      . Cholecalciferol (VITAMIN D3) 400 UNITS CAPS Take 400 Units by mouth daily.       . cyanocobalamin 500 MCG tablet Take 500 mcg by mouth daily.      . ferrous fumarate-iron polysaccharide complex (TANDEM) 162-115.2 MG CAPS Take 1 capsule by mouth 2 (two) times daily.  60 capsule  1  . fish oil-omega-3 fatty acids 1000 MG capsule Take 1 g by mouth daily.      . fluticasone (FLONASE) 50 MCG/ACT nasal spray Place 1 spray into both nostrils daily as needed for allergies or rhinitis. Use as needed for nasal congestion.  16 g  1  . folic acid (FOLVITE) 1 MG tablet Take 1 mg by  mouth daily.      . furosemide (LASIX) 20 MG tablet TAKE TWO TABLETS EVERY MORNING  60 tablet  11  . gabapentin (NEURONTIN) 300 MG capsule Take 300 mg by mouth 2 (two) times daily.      Marland Kitchen glimepiride (AMARYL) 4 MG tablet Take 2 mg by mouth daily before breakfast.       . levothyroxine (SYNTHROID, LEVOTHROID) 50 MCG tablet Take 50 mcg by mouth daily with breakfast.       . metFORMIN (GLUCOPHAGE) 500 MG tablet Take 1 tablet by mouth daily.      . Multiple Vitamin (MULITIVITAMIN WITH MINERALS) TABS Take 1 tablet by mouth daily.      . Multiple Vitamins-Minerals (ICAPS AREDS FORMULA PO) Take 1 capsule by mouth daily with breakfast.      . nitroGLYCERIN (NITRODUR - DOSED IN MG/24 HR) 0.2 mg/hr patch Place 1 patch onto the skin at bedtime. On the foot      . nortriptyline (PAMELOR) 10 MG capsule Take 1 capsule (10 mg total) by mouth at bedtime. For neuropathy  30 capsule  1  . protein supplement (RESOURCE BENEPROTEIN) POWD Take 1 scoop by mouth 3 (three) times daily with meals.       . senna-docusate (SENOKOT-S) 8.6-50 MG per tablet Take 1 tablet by mouth as needed.      . silver sulfADIAZINE (SILVADENE) 1 % cream Apply 1 application topically daily.      Marland Kitchen  tiotropium (SPIRIVA) 18 MCG inhalation capsule Place 18 mcg into inhaler and inhale daily with breakfast.       . vitamin C (ASCORBIC ACID) 500 MG tablet Take 500 mg by mouth daily.      Marland Kitchen warfarin (COUMADIN) 4 MG tablet Take 2mg  on Monday ,Wednesday, Friday.  Take 4mg  on Tuesday, Thursday, Saturday, Sunday.       No current facility-administered medications for this visit.     Past Medical History  Diagnosis Date  . Lung disease     BRONCHOSPASTIC  . Neuropathy     takes Gabapentin bid  . Hypercholesterolemia     doesn't require meds for this  . Anemia   . COPD (chronic obstructive pulmonary disease)   . Blood transfusion ~ 2012    "once; not related to a surgery or bleeding that I remember" (06/06/2013)  . Hypertension     takes  Prinizide daily  . Chronic atrial fibrillation     takes Coumadin as instructed  . Peripheral vascular disease   . CHF (congestive heart failure)     takes Lasix every other day  . Emphysema   . History of head injury     many yrs ago  . Vaginal discharge     has seen GYN but says its nothing to worry about  . Diabetes mellitus     takes Metformin and Amaryl daily  . Hypothyroidism     takes Synthroid daily  . Macular degeneration     dry  . Bronchial pneumonia     "once; several years ago" (06/06/2013)  . Rheumatoid arthritis     "shoulders; daughter gives me shots of Methotrexate q week" (06/06/2013)    Past Surgical History  Procedure Laterality Date  . Total hip arthroplasty Left ?2013  . Back surgery    . Fracture surgery  2011    LEFT HIP FX /REPLACEMENT   . Cholecystectomy  1951  . Dilation and curettage of uterus    . Amputation  11/09/2011    Procedure: AMPUTATION DIGIT;  Surgeon: Mcarthur Rossetti, MD;  Location: [place];  Service: Orthopedics;  Laterality: Right;  Right Great Toe Amputation  . Amputation  01/05/2012    Procedure: AMPUTATION RAY;  Surgeon: Mcarthur Rossetti, MD;  Location: [facility];  Service: Orthopedics;  Laterality: Left;  Amputation Left 4th and 5th Toes  . Cardioversion    . Amputation  05/31/2012    Procedure: AMPUTATION RAY;  Surgeon: Newt Minion, MD;  Location: [place];  Service: Orthopedics;  Laterality: Left;  Left foot 1st ray amp  . Amputation  06/21/2012    Procedure: AMPUTATION BELOW KNEE;  Surgeon: Newt Minion, MD;  Location: [place];  Service: Orthopedics;  Laterality: Left;  Left Below Knee Amputation  . Foot amputation through metatarsal Right 06/06/2013    midfoot/notes 06/06/2013  . Joint replacement    . Appendectomy    . Total abdominal hysterectomy  1975  . Cervical disc surgery      "the top vertebrae" (06/06/2013)  . Cataract extraction w/ intraocular lens  implant, bilateral Bilateral   . Eye surgery       bilateral -cataract surgery  . Amputation Right 06/06/2013    Procedure: RIGHT MIDFOOT AMPUTATION;  Surgeon: Newt Minion, MD;  Location: [place];  Service: Orthopedics;  Laterality: Right;    History   Social History  . Marital Status: Widowed    Spouse Name: N/A    Number  of Children: N/A  . Years of Education: N/A   Occupational History  . Not on file.   Social History Main Topics  . Smoking status: Former Smoker -- 4 years    Types: Cigarettes  . Smokeless tobacco: Never Used     Comment: 06/06/2013 "smoked a little in my teens"  . Alcohol Use: No  . Drug Use: No  . Sexual Activity: No   Other Topics Concern  . Not on file   Social History Narrative  . No narrative on file    ROS: no fevers or chills, productive cough, hemoptysis, dysphasia, odynophagia, melena, hematochezia, dysuria, hematuria, rash, seizure activity, orthopnea, PND, pedal edema, claudication. Remaining systems are negative.  Physical Exam: Well-developed well-nourished in no acute distress.  Skin is warm and dry.  HEENT is normal.  Neck is supple.  Chest is clear to auscultation with normal expansion.  Cardiovascular exam is irregular Abdominal exam nontender or distended. No masses palpated. Extremities - status post left BKA, right foot in boot neuro grossly intact

## 2013-09-15 NOTE — Assessment & Plan Note (Signed)
Blood pressure controlled.continue present medications. 

## 2013-09-15 NOTE — Assessment & Plan Note (Signed)
Patient's rate is controlled on no medications. Continue Coumadin. I provided the name of NOAC today. She will contact us if she would like to change in the future.

## 2013-09-15 NOTE — Assessment & Plan Note (Signed)
Patient's volume status much improved. Continue present dose of Lasix. Check potassium and renal function.

## 2013-09-15 NOTE — Patient Instructions (Signed)
Your physician wants you to follow-up in: 6 MONTHS WITH DR CRENSHAW You will receive a reminder letter in the mail two months in advance. If you don't receive a letter, please call our office to schedule the follow-up appointment.  

## 2013-09-16 DIAGNOSIS — L89609 Pressure ulcer of unspecified heel, unspecified stage: Secondary | ICD-10-CM

## 2013-09-16 DIAGNOSIS — L8995 Pressure ulcer of unspecified site, unstageable: Secondary | ICD-10-CM | POA: Diagnosis not present

## 2013-09-16 DIAGNOSIS — E1159 Type 2 diabetes mellitus with other circulatory complications: Secondary | ICD-10-CM

## 2013-09-16 DIAGNOSIS — J441 Chronic obstructive pulmonary disease with (acute) exacerbation: Secondary | ICD-10-CM | POA: Diagnosis not present

## 2013-09-16 DIAGNOSIS — R269 Unspecified abnormalities of gait and mobility: Secondary | ICD-10-CM

## 2013-09-16 DIAGNOSIS — I798 Other disorders of arteries, arterioles and capillaries in diseases classified elsewhere: Secondary | ICD-10-CM | POA: Diagnosis not present

## 2013-09-17 DIAGNOSIS — I70269 Atherosclerosis of native arteries of extremities with gangrene, unspecified extremity: Secondary | ICD-10-CM | POA: Diagnosis not present

## 2013-09-17 DIAGNOSIS — L97509 Non-pressure chronic ulcer of other part of unspecified foot with unspecified severity: Secondary | ICD-10-CM | POA: Diagnosis not present

## 2013-09-17 DIAGNOSIS — E1149 Type 2 diabetes mellitus with other diabetic neurological complication: Secondary | ICD-10-CM | POA: Diagnosis not present

## 2013-10-02 DIAGNOSIS — I10 Essential (primary) hypertension: Secondary | ICD-10-CM | POA: Diagnosis not present

## 2013-10-02 DIAGNOSIS — S88119A Complete traumatic amputation at level between knee and ankle, unspecified lower leg, initial encounter: Secondary | ICD-10-CM | POA: Diagnosis not present

## 2013-10-13 DIAGNOSIS — E1149 Type 2 diabetes mellitus with other diabetic neurological complication: Secondary | ICD-10-CM | POA: Diagnosis not present

## 2013-10-13 DIAGNOSIS — I70269 Atherosclerosis of native arteries of extremities with gangrene, unspecified extremity: Secondary | ICD-10-CM | POA: Diagnosis not present

## 2013-10-13 DIAGNOSIS — L97509 Non-pressure chronic ulcer of other part of unspecified foot with unspecified severity: Secondary | ICD-10-CM | POA: Diagnosis not present

## 2013-11-10 ENCOUNTER — Encounter (HOSPITAL_COMMUNITY): Payer: Self-pay | Admitting: Emergency Medicine

## 2013-11-10 ENCOUNTER — Inpatient Hospital Stay (HOSPITAL_COMMUNITY)
Admission: EM | Admit: 2013-11-10 | Discharge: 2013-11-18 | DRG: 871 | Disposition: A | Discharge status: Skilled Nursing Facility | Payer: Medicare Other | Attending: Internal Medicine | Admitting: Internal Medicine

## 2013-11-10 ENCOUNTER — Emergency Department (HOSPITAL_COMMUNITY): Payer: Medicare Other

## 2013-11-10 DIAGNOSIS — L97509 Non-pressure chronic ulcer of other part of unspecified foot with unspecified severity: Secondary | ICD-10-CM | POA: Diagnosis present

## 2013-11-10 DIAGNOSIS — I798 Other disorders of arteries, arterioles and capillaries in diseases classified elsewhere: Secondary | ICD-10-CM | POA: Diagnosis present

## 2013-11-10 DIAGNOSIS — J189 Pneumonia, unspecified organism: Secondary | ICD-10-CM | POA: Diagnosis not present

## 2013-11-10 DIAGNOSIS — S88119A Complete traumatic amputation at level between knee and ankle, unspecified lower leg, initial encounter: Secondary | ICD-10-CM | POA: Diagnosis not present

## 2013-11-10 DIAGNOSIS — E1129 Type 2 diabetes mellitus with other diabetic kidney complication: Secondary | ICD-10-CM | POA: Diagnosis present

## 2013-11-10 DIAGNOSIS — S98919A Complete traumatic amputation of unspecified foot, level unspecified, initial encounter: Secondary | ICD-10-CM

## 2013-11-10 DIAGNOSIS — R05 Cough: Secondary | ICD-10-CM | POA: Diagnosis not present

## 2013-11-10 DIAGNOSIS — I4891 Unspecified atrial fibrillation: Secondary | ICD-10-CM | POA: Diagnosis present

## 2013-11-10 DIAGNOSIS — I739 Peripheral vascular disease, unspecified: Secondary | ICD-10-CM | POA: Diagnosis not present

## 2013-11-10 DIAGNOSIS — M069 Rheumatoid arthritis, unspecified: Secondary | ICD-10-CM | POA: Diagnosis present

## 2013-11-10 DIAGNOSIS — E1159 Type 2 diabetes mellitus with other circulatory complications: Secondary | ICD-10-CM | POA: Diagnosis present

## 2013-11-10 DIAGNOSIS — I509 Heart failure, unspecified: Secondary | ICD-10-CM | POA: Diagnosis not present

## 2013-11-10 DIAGNOSIS — N058 Unspecified nephritic syndrome with other morphologic changes: Secondary | ICD-10-CM | POA: Diagnosis present

## 2013-11-10 DIAGNOSIS — Z7901 Long term (current) use of anticoagulants: Secondary | ICD-10-CM | POA: Diagnosis not present

## 2013-11-10 DIAGNOSIS — J438 Other emphysema: Secondary | ICD-10-CM | POA: Diagnosis present

## 2013-11-10 DIAGNOSIS — E1142 Type 2 diabetes mellitus with diabetic polyneuropathy: Secondary | ICD-10-CM | POA: Diagnosis present

## 2013-11-10 DIAGNOSIS — R112 Nausea with vomiting, unspecified: Secondary | ICD-10-CM | POA: Diagnosis not present

## 2013-11-10 DIAGNOSIS — I129 Hypertensive chronic kidney disease with stage 1 through stage 4 chronic kidney disease, or unspecified chronic kidney disease: Secondary | ICD-10-CM | POA: Diagnosis present

## 2013-11-10 DIAGNOSIS — E1149 Type 2 diabetes mellitus with other diabetic neurological complication: Secondary | ICD-10-CM | POA: Diagnosis present

## 2013-11-10 DIAGNOSIS — I10 Essential (primary) hypertension: Secondary | ICD-10-CM | POA: Diagnosis not present

## 2013-11-10 DIAGNOSIS — N183 Chronic kidney disease, stage 3 unspecified: Secondary | ICD-10-CM | POA: Diagnosis present

## 2013-11-10 DIAGNOSIS — Z9849 Cataract extraction status, unspecified eye: Secondary | ICD-10-CM

## 2013-11-10 DIAGNOSIS — Z794 Long term (current) use of insulin: Secondary | ICD-10-CM | POA: Diagnosis not present

## 2013-11-10 DIAGNOSIS — A419 Sepsis, unspecified organism: Secondary | ICD-10-CM | POA: Diagnosis present

## 2013-11-10 DIAGNOSIS — E876 Hypokalemia: Secondary | ICD-10-CM | POA: Diagnosis present

## 2013-11-10 DIAGNOSIS — R4182 Altered mental status, unspecified: Secondary | ICD-10-CM | POA: Diagnosis not present

## 2013-11-10 DIAGNOSIS — Z89529 Acquired absence of unspecified knee: Secondary | ICD-10-CM | POA: Diagnosis not present

## 2013-11-10 DIAGNOSIS — E039 Hypothyroidism, unspecified: Secondary | ICD-10-CM | POA: Diagnosis present

## 2013-11-10 DIAGNOSIS — R0602 Shortness of breath: Secondary | ICD-10-CM | POA: Diagnosis not present

## 2013-11-10 DIAGNOSIS — R059 Cough, unspecified: Secondary | ICD-10-CM | POA: Diagnosis not present

## 2013-11-10 DIAGNOSIS — R509 Fever, unspecified: Secondary | ICD-10-CM | POA: Diagnosis not present

## 2013-11-10 DIAGNOSIS — Z87891 Personal history of nicotine dependence: Secondary | ICD-10-CM

## 2013-11-10 DIAGNOSIS — E46 Unspecified protein-calorie malnutrition: Secondary | ICD-10-CM | POA: Diagnosis present

## 2013-11-10 DIAGNOSIS — R6889 Other general symptoms and signs: Secondary | ICD-10-CM | POA: Diagnosis not present

## 2013-11-10 DIAGNOSIS — Z6826 Body mass index (BMI) 26.0-26.9, adult: Secondary | ICD-10-CM

## 2013-11-10 DIAGNOSIS — Z79899 Other long term (current) drug therapy: Secondary | ICD-10-CM | POA: Diagnosis not present

## 2013-11-10 DIAGNOSIS — Z5189 Encounter for other specified aftercare: Secondary | ICD-10-CM | POA: Diagnosis not present

## 2013-11-10 DIAGNOSIS — Z96649 Presence of unspecified artificial hip joint: Secondary | ICD-10-CM | POA: Diagnosis not present

## 2013-11-10 DIAGNOSIS — G609 Hereditary and idiopathic neuropathy, unspecified: Secondary | ICD-10-CM | POA: Diagnosis not present

## 2013-11-10 LAB — COMPREHENSIVE METABOLIC PANEL
ALBUMIN: 3.4 g/dL — AB (ref 3.5–5.2)
ALT: 9 U/L (ref 0–35)
AST: 17 U/L (ref 0–37)
Alkaline Phosphatase: 69 U/L (ref 39–117)
BUN: 28 mg/dL — ABNORMAL HIGH (ref 6–23)
CALCIUM: 9.4 mg/dL (ref 8.4–10.5)
CHLORIDE: 95 meq/L — AB (ref 96–112)
CO2: 27 meq/L (ref 19–32)
CREATININE: 0.95 mg/dL (ref 0.50–1.10)
GFR calc Af Amer: 59 mL/min — ABNORMAL LOW (ref >90)
GFR calc non Af Amer: 51 mL/min — ABNORMAL LOW (ref >90)
Glucose, Bld: 288 mg/dL — ABNORMAL HIGH (ref 70–99)
Potassium: 4 mEq/L (ref 3.7–5.3)
SODIUM: 140 meq/L (ref 137–147)
Total Bilirubin: 0.7 mg/dL (ref 0.3–1.2)
Total Protein: 7.3 g/dL (ref 6.0–8.3)

## 2013-11-10 LAB — PHOSPHORUS: Phosphorus: 3.3 mg/dL (ref 2.3–4.6)

## 2013-11-10 LAB — CBC WITH DIFFERENTIAL/PLATELET
BASOS ABS: 0 10*3/uL (ref 0.0–0.1)
BASOS PCT: 0 % (ref 0–1)
EOS PCT: 0 % (ref 0–5)
Eosinophils Absolute: 0 10*3/uL (ref 0.0–0.7)
HCT: 35.3 % — ABNORMAL LOW (ref 36.0–46.0)
Hemoglobin: 11.6 g/dL — ABNORMAL LOW (ref 12.0–15.0)
LYMPHS PCT: 3 % — AB (ref 12–46)
Lymphs Abs: 0.6 10*3/uL — ABNORMAL LOW (ref 0.7–4.0)
MCH: 30.1 pg (ref 26.0–34.0)
MCHC: 32.9 g/dL (ref 30.0–36.0)
MCV: 91.5 fL (ref 78.0–100.0)
MONO ABS: 0.7 10*3/uL (ref 0.1–1.0)
Monocytes Relative: 4 % (ref 3–12)
NEUTROS ABS: 18.9 10*3/uL — AB (ref 1.7–7.7)
Neutrophils Relative %: 93 % — ABNORMAL HIGH (ref 43–77)
PLATELETS: 258 10*3/uL (ref 150–400)
RBC: 3.86 MIL/uL — ABNORMAL LOW (ref 3.87–5.11)
RDW: 15.2 % (ref 11.5–15.5)
WBC: 20.2 10*3/uL — AB (ref 4.0–10.5)

## 2013-11-10 LAB — URINALYSIS, ROUTINE W REFLEX MICROSCOPIC
BILIRUBIN URINE: NEGATIVE
GLUCOSE, UA: NEGATIVE mg/dL
Ketones, ur: NEGATIVE mg/dL
Leukocytes, UA: NEGATIVE
Nitrite: NEGATIVE
Protein, ur: NEGATIVE mg/dL
SPECIFIC GRAVITY, URINE: 1.013 (ref 1.005–1.030)
Urobilinogen, UA: 0.2 mg/dL (ref 0.0–1.0)
pH: 5 (ref 5.0–8.0)

## 2013-11-10 LAB — PRO B NATRIURETIC PEPTIDE: PRO B NATRI PEPTIDE: 2285 pg/mL — AB (ref 0–450)

## 2013-11-10 LAB — PROTIME-INR
INR: 3.44 — ABNORMAL HIGH (ref 0.00–1.49)
Prothrombin Time: 33.4 seconds — ABNORMAL HIGH (ref 11.6–15.2)

## 2013-11-10 LAB — GLUCOSE, CAPILLARY
GLUCOSE-CAPILLARY: 276 mg/dL — AB (ref 70–99)
GLUCOSE-CAPILLARY: 187 mg/dL — AB (ref 70–99)

## 2013-11-10 LAB — APTT: aPTT: 59 seconds — ABNORMAL HIGH (ref 24–37)

## 2013-11-10 LAB — URINE MICROSCOPIC-ADD ON

## 2013-11-10 LAB — I-STAT CG4 LACTIC ACID, ED: LACTIC ACID, VENOUS: 3.19 mmol/L — AB (ref 0.5–2.2)

## 2013-11-10 LAB — CBG MONITORING, ED: Glucose-Capillary: 289 mg/dL — ABNORMAL HIGH (ref 70–99)

## 2013-11-10 LAB — MRSA PCR SCREENING: MRSA by PCR: NEGATIVE

## 2013-11-10 LAB — TROPONIN I: Troponin I: <0.3 ng/mL (ref <0.30)

## 2013-11-10 LAB — LIPASE, BLOOD: Lipase: 15 U/L (ref 11–59)

## 2013-11-10 LAB — MAGNESIUM: Magnesium: 1.3 mg/dL — ABNORMAL LOW (ref 1.5–2.5)

## 2013-11-10 MED ORDER — ACETAMINOPHEN 325 MG PO TABS
650.0000 mg | ORAL_TABLET | Freq: Four times a day (QID) | ORAL | Status: DC | PRN
  Administered 2013-11-13: 650 mg via ORAL
  Filled 2013-11-10: qty 2

## 2013-11-10 MED ORDER — SODIUM CHLORIDE 0.9 % IV SOLN
INTRAVENOUS | Status: DC
  Administered 2013-11-10 (×2): via INTRAVENOUS

## 2013-11-10 MED ORDER — NITROGLYCERIN 0.2 MG/HR TD PT24: 0.2000 mg | MEDICATED_PATCH | TRANSDERMAL | Status: DC

## 2013-11-10 MED ORDER — ONDANSETRON HCL 4 MG PO TABS: 4.0000 mg | ORAL_TABLET | Freq: Four times a day (QID) | ORAL | Status: DC | PRN

## 2013-11-10 MED ORDER — ONDANSETRON HCL 4 MG/2ML IJ SOLN: 4.0000 mg | Freq: Four times a day (QID) | INTRAMUSCULAR | Status: DC | PRN

## 2013-11-10 MED ORDER — GABAPENTIN 300 MG PO CAPS
300.0000 mg | ORAL_CAPSULE | Freq: Two times a day (BID) | ORAL | Status: DC
  Administered 2013-11-10 – 2013-11-18 (×16): 300 mg via ORAL
  Filled 2013-11-10 (×17): qty 1

## 2013-11-10 MED ORDER — SODIUM CHLORIDE 0.9 % IV SOLN
INTRAVENOUS | Status: DC
  Administered 2013-11-11 – 2013-11-14 (×3): via INTRAVENOUS
  Administered 2013-11-15: 20 mL/h via INTRAVENOUS

## 2013-11-10 MED ORDER — VANCOMYCIN HCL IN DEXTROSE 1-5 GM/200ML-% IV SOLN
1000.0000 mg | INTRAVENOUS | Status: DC
  Administered 2013-11-10 – 2013-11-15 (×6): 1000 mg via INTRAVENOUS
  Filled 2013-11-10 (×7): qty 200

## 2013-11-10 MED ORDER — DEXTROSE 5 % IV SOLN
1.0000 g | Freq: Once | INTRAVENOUS | Status: AC
  Administered 2013-11-10: 1 g via INTRAVENOUS
  Filled 2013-11-10: qty 1

## 2013-11-10 MED ORDER — LEVOTHYROXINE SODIUM 50 MCG PO TABS
50.0000 ug | ORAL_TABLET | Freq: Every day | ORAL | Status: DC
  Administered 2013-11-11 – 2013-11-18 (×8): 50 ug via ORAL
  Filled 2013-11-10 (×9): qty 1

## 2013-11-10 MED ORDER — DEXTROSE 5 % IV SOLN: 500.0000 mg | Freq: Once | INTRAVENOUS | Status: DC

## 2013-11-10 MED ORDER — DEXTROSE 5 % IV SOLN
500.0000 mg | Freq: Once | INTRAVENOUS | Status: AC
  Administered 2013-11-10: 500 mg via INTRAVENOUS

## 2013-11-10 MED ORDER — CHOLECALCIFEROL 10 MCG (400 UNIT) PO TABS
400.0000 [IU] | ORAL_TABLET | Freq: Every day | ORAL | Status: DC
  Administered 2013-11-11 – 2013-11-16 (×6): 400 [IU] via ORAL
  Filled 2013-11-10 (×6): qty 1

## 2013-11-10 MED ORDER — CYANOCOBALAMIN 500 MCG PO TABS
500.0000 ug | ORAL_TABLET | Freq: Every day | ORAL | Status: DC
  Administered 2013-11-11 – 2013-11-18 (×8): 500 ug via ORAL
  Filled 2013-11-10 (×8): qty 1

## 2013-11-10 MED ORDER — FOLIC ACID 1 MG PO TABS
1.0000 mg | ORAL_TABLET | Freq: Every day | ORAL | Status: DC
  Administered 2013-11-11 – 2013-11-16 (×6): 1 mg via ORAL
  Filled 2013-11-10 (×6): qty 1

## 2013-11-10 MED ORDER — ALBUTEROL SULFATE (2.5 MG/3ML) 0.083% IN NEBU: 3.0000 mL | INHALATION_SOLUTION | Freq: Four times a day (QID) | RESPIRATORY_TRACT | Status: DC | PRN

## 2013-11-10 MED ORDER — GUAIFENESIN 100 MG/5ML PO SYRP
200.0000 mg | ORAL_SOLUTION | ORAL | Status: DC | PRN
  Administered 2013-11-15: 200 mg via ORAL
  Filled 2013-11-10 (×2): qty 10

## 2013-11-10 MED ORDER — ACETAMINOPHEN 325 MG PO TABS: 650.0000 mg | ORAL_TABLET | Freq: Once | ORAL | Status: DC

## 2013-11-10 MED ORDER — DEXTROSE 5 % IV SOLN: 1.0000 g | Freq: Once | INTRAVENOUS | Status: DC

## 2013-11-10 MED ORDER — SODIUM CHLORIDE 0.9 % IV SOLN
INTRAVENOUS | Status: DC
Start: 2013-11-10 — End: 2013-11-10

## 2013-11-10 MED ORDER — ACETAMINOPHEN 650 MG RE SUPP
650.0000 mg | Freq: Once | RECTAL | Status: AC
  Administered 2013-11-10: 650 mg via RECTAL
  Filled 2013-11-10: qty 1

## 2013-11-10 MED ORDER — SENNOSIDES-DOCUSATE SODIUM 8.6-50 MG PO TABS: 1.0000 | ORAL_TABLET | Freq: Every evening | ORAL | Status: DC | PRN

## 2013-11-10 MED ORDER — DEXTROSE 5 % IV SOLN: 1.0000 g | INTRAVENOUS | Status: DC

## 2013-11-10 MED ORDER — ACETAMINOPHEN 650 MG RE SUPP: 650.0000 mg | Freq: Four times a day (QID) | RECTAL | Status: DC | PRN

## 2013-11-10 MED ORDER — NORTRIPTYLINE HCL 10 MG PO CAPS
10.0000 mg | ORAL_CAPSULE | Freq: Every day | ORAL | Status: DC
  Administered 2013-11-10 – 2013-11-15 (×6): 10 mg via ORAL
  Filled 2013-11-10 (×7): qty 1

## 2013-11-10 MED ORDER — INSULIN ASPART 100 UNIT/ML ~~LOC~~ SOLN
0.0000 [IU] | Freq: Every day | SUBCUTANEOUS | Status: DC
Start: 2013-11-10 — End: 2013-11-18
  Administered 2013-11-12: 2 [IU] via SUBCUTANEOUS

## 2013-11-10 MED ORDER — BENEPROTEIN PO POWD: 1.0000 | Freq: Three times a day (TID) | ORAL | Status: DC

## 2013-11-10 MED ORDER — FLUTICASONE PROPIONATE 50 MCG/ACT NA SUSP
1.0000 | Freq: Every day | NASAL | Status: DC | PRN
  Filled 2013-11-10: qty 16

## 2013-11-10 MED ORDER — NITROGLYCERIN 0.2 MG/HR TD PT24
0.2000 mg | MEDICATED_PATCH | Freq: Every day | TRANSDERMAL | Status: DC
  Administered 2013-11-11 – 2013-11-18 (×8): 0.2 mg via TRANSDERMAL
  Filled 2013-11-10 (×8): qty 1

## 2013-11-10 MED ORDER — INSULIN ASPART 100 UNIT/ML ~~LOC~~ SOLN: 0.0000 [IU] | Freq: Three times a day (TID) | SUBCUTANEOUS | Status: DC

## 2013-11-10 MED ORDER — VITAMIN D3 10 MCG (400 UNIT) PO CAPS: 400.0000 [IU] | ORAL_CAPSULE | Freq: Every day | ORAL | Status: DC

## 2013-11-10 MED ORDER — PRO-STAT SUGAR FREE PO LIQD
30.0000 mL | Freq: Every day | ORAL | Status: DC
  Administered 2013-11-11: 30 mL via ORAL
  Filled 2013-11-10 (×2): qty 30

## 2013-11-10 MED ORDER — CADEXOMER IODINE 0.9 % EX GEL
1.0000 "application " | Freq: Every day | CUTANEOUS | Status: DC | PRN
  Administered 2013-11-11 – 2013-11-17 (×2): 1 via TOPICAL
  Filled 2013-11-10: qty 40

## 2013-11-10 MED ORDER — TIOTROPIUM BROMIDE MONOHYDRATE 18 MCG IN CAPS
18.0000 ug | ORAL_CAPSULE | Freq: Every day | RESPIRATORY_TRACT | Status: DC
  Administered 2013-11-11 – 2013-11-18 (×7): 18 ug via RESPIRATORY_TRACT
  Filled 2013-11-10 (×2): qty 5

## 2013-11-10 MED ORDER — PIPERACILLIN-TAZOBACTAM 3.375 G IVPB
3.3750 g | Freq: Three times a day (TID) | INTRAVENOUS | Status: DC
  Administered 2013-11-10 – 2013-11-17 (×19): 3.375 g via INTRAVENOUS
  Filled 2013-11-10 (×22): qty 50

## 2013-11-10 NOTE — ED Notes (Signed)
Vital signs stable. 

## 2013-11-10 NOTE — Progress Notes (Signed)
ANTICOAGULATION CONSULT NOTE - Initial Consult  Pharmacy Consult for Warfarin Indication: Chronic Afib  Allergies  Allergen Reactions  . Other     Narcotics=makes her very disoriented  . Percocet [Oxycodone-Acetaminophen] Other (See Comments)    Dilerium, hallucinations  . Tramadol Anxiety  . Vicodin [Hydrocodone-Acetaminophen] Anxiety   Patient Measurements: Weight: 151 lb 3.2 oz (68.584 kg)  Vital Signs: Temp: 99.3 F (37.4 C) (03/30 1622) Temp src: Oral (03/30 1510) BP: 106/55 mmHg (03/30 1509) Pulse Rate: 112 (03/30 1509)  Labs:  Recent Labs  11/10/13 1145  HGB 11.6*  HCT 35.3*  PLT 258  APTT 59*  LABPROT 33.4*  INR 3.44*  CREATININE 0.95  TROPONINI <0.30   The CrCl is unknown because both a height and weight (above a minimum accepted value) are required for this calculation.  Medical History: Past Medical History  Diagnosis Date  . Lung disease     BRONCHOSPASTIC  . Neuropathy     takes Gabapentin bid  . Hypercholesterolemia     doesn't require meds for this  . Anemia   . COPD (chronic obstructive pulmonary disease)   . Blood transfusion ~ 2012    "once; not related to a surgery or bleeding that I remember" (06/06/2013)  . Hypertension     takes Prinizide daily  . Chronic atrial fibrillation     takes Coumadin as instructed  . Peripheral vascular disease   . CHF (congestive heart failure)     takes Lasix every other day  . Emphysema   . History of head injury     many yrs ago  . Vaginal discharge     has seen GYN but says its nothing to worry about  . Diabetes mellitus     takes Metformin and Amaryl daily  . Hypothyroidism     takes Synthroid daily  . Macular degeneration     dry  . Bronchial pneumonia     "once; several years ago" (06/06/2013)  . Rheumatoid arthritis     "shoulders; daughter gives me shots of Methotrexate q week" (06/06/2013)    Medications:  Medication Sig Dispense Refill  . warfarin (COUMADIN) 4 MG tablet Take 2-4  mg by mouth daily. Take 2mg  on Monday ,Wednesday, Friday.  Take 4mg  on Tuesday, Thursday, Saturday, Sunday.       Assessment: 33 yof who is admitted with s/s of sepsis.  She has been started on IV antibiotics.  She has multiple medical problems including a history of chronic atrial fibrillation.  She is on chronic Warfarin therapy for this.  Today her INR is 3.44 which is above desired goal range.  She has no noted bleeding and her CBC is stable (11.6/35.3) and a platelet of 258K at baseline.  Goal of Therapy:  INR 2-3 Monitor platelets by anticoagulation protocol: Yes   Plan:  1.  Will hold Warfarin tonight 2.  Daily PT/INR 3.  Monitor for bleeding complications with CBC weekly  Rober Minion, PharmD., MS Clinical Pharmacist Pager:  [phone] Thank you for allowing pharmacy to be part of this patients care team. 11/10/2013,7:21 PM

## 2013-11-10 NOTE — H&P (Signed)
Sydney [name] is an 80 y.o. female.   Chief Complaint: sob, cough HPI: Sydney [name] is a pleasant 80 yo female.   She has bilateral amputations.  She has lived at [facility] for the last 2 months and she likes it there.  She reports two weeks of cough and could symptoms. The cough has not improved at all. It is deep but not productive.  Today she develop nausea and vomiting this a.m.Marland Kitchen  Then she showed significant cyanosis.  She is brought to the ER and found to be hypoxic and febrile.   There has been no blood noted above or below.  She has a wound to her right heel that is being dressed daily.  She will require admission.  Past Medical History  Diagnosis Date  . Lung disease     BRONCHOSPASTIC  . Neuropathy     takes Gabapentin bid  . Hypercholesterolemia     doesn't require meds for this  . Anemia   . COPD (chronic obstructive pulmonary disease)   . Blood transfusion ~ 2012    "once; not related to a surgery or bleeding that I remember" (06/06/2013)  . Hypertension     takes Prinizide daily  . Chronic atrial fibrillation     takes Coumadin as instructed  . Peripheral vascular disease   . CHF (congestive heart failure)     takes Lasix every other day  . Emphysema   . History of head injury     many yrs ago  . Vaginal discharge     has seen GYN but says its nothing to worry about  . Diabetes mellitus     takes Metformin and Amaryl daily  . Hypothyroidism     takes Synthroid daily  . Macular degeneration     dry  . Bronchial pneumonia     "once; several years ago" (06/06/2013)  . Rheumatoid arthritis     "shoulders; daughter gives me shots of Methotrexate q week" (06/06/2013)    Past Surgical History  Procedure Laterality Date  . Total hip arthroplasty Left ?2013  . Back surgery    . Fracture surgery  2011    LEFT HIP FX /REPLACEMENT   . Cholecystectomy  1951  . Dilation and curettage of uterus    . Amputation  11/09/2011    Procedure: AMPUTATION DIGIT;  Surgeon: Kathryne Hitch, MD;  Location: [facility] OR;  Service: Orthopedics;  Laterality: Right;  Right Great Toe Amputation  . Amputation  01/05/2012    Procedure: AMPUTATION RAY;  Surgeon: Kathryne Hitch, MD;  Location: [facility];  Service: Orthopedics;  Laterality: Left;  Amputation Left 4th and 5th Toes  . Cardioversion    . Amputation  05/31/2012    Procedure: AMPUTATION RAY;  Surgeon: Nadara Mustard, MD;  Location: [facility] OR;  Service: Orthopedics;  Laterality: Left;  Left foot 1st ray amp  . Amputation  06/21/2012    Procedure: AMPUTATION BELOW KNEE;  Surgeon: Nadara Mustard, MD;  Location: [facility] OR;  Service: Orthopedics;  Laterality: Left;  Left Below Knee Amputation  . Foot amputation through metatarsal Right 06/06/2013    midfoot/notes 06/06/2013  . Joint replacement    . Appendectomy    . Total abdominal hysterectomy  1975  . Cervical disc surgery      "the top vertebrae" (06/06/2013)  . Cataract extraction w/ intraocular lens  implant, bilateral Bilateral   . Eye surgery      bilateral -cataract surgery  .  Amputation Right 06/06/2013    Procedure: RIGHT MIDFOOT AMPUTATION;  Surgeon: Newt Minion, MD;  Location: [facility];  Service: Orthopedics;  Laterality: Right;    Family History  Problem Relation Age of Onset  . Heart disease Mother   . Stroke Mother   . Heart disease Father   . Stroke Father   . Heart attack Father   . Muscular dystrophy Brother   . Anesthesia problems Neg Hx   . Hypotension Neg Hx   . Malignant hyperthermia Neg Hx   . Pseudochol deficiency Neg Hx    Social History:  reports that she has quit smoking. Her smoking use included Cigarettes. She smoked 0.00 packs per day for 4 years. She has never used smokeless tobacco. She reports that she does not drink alcohol or use illicit drugs.  Allergies:  Allergies  Allergen Reactions  . Other     Narcotics=makes her very disoriented  . Percocet [Oxycodone-Acetaminophen] Other (See Comments)    Dilerium, hallucinations  . Tramadol  Anxiety  . Vicodin [Hydrocodone-Acetaminophen] Anxiety    Medications Prior to Admission  Medication Sig Dispense Refill  . cadexomer iodine (IODOSORB) 0.9 % gel Apply 1 application topically daily as needed for wound care.      . Calcium-Vitamin D-Vitamin K ([employer] CALCIUM SOFT CHEWS PO) Take 1 tablet by mouth 2 (two) times daily.       . Cholecalciferol (VITAMIN D3) 400 UNITS CAPS Take 400 Units by mouth daily.       . cyanocobalamin 500 MCG tablet Take 500 mcg by mouth daily.      . diclofenac sodium (VOLTAREN) 1 % GEL Apply 2 g topically 4 (four) times daily.      . ferrous fumarate-iron polysaccharide complex (TANDEM) 162-115.2 MG CAPS Take 1 capsule by mouth 2 (two) times daily.  60 capsule  1  . fish oil-omega-3 fatty acids 1000 MG capsule Take 1 g by mouth daily.      . folic acid (FOLVITE) 1 MG tablet Take 1 mg by mouth daily.      . furosemide (LASIX) 20 MG tablet Take 40 mg by mouth daily.      Marland Kitchen gabapentin (NEURONTIN) 300 MG capsule Take 300 mg by mouth 2 (two) times daily.      Marland Kitchen glimepiride (AMARYL) 4 MG tablet Take 2 mg by mouth daily before breakfast.       . guaifenesin (ROBITUSSIN CHEST CONGESTION) 100 MG/5ML syrup Take 200 mg by mouth every 4 (four) hours as needed for cough.      . levothyroxine (SYNTHROID, LEVOTHROID) 50 MCG tablet Take 50 mcg by mouth daily with breakfast.       . metFORMIN (GLUCOPHAGE) 500 MG tablet Take 1 tablet by mouth daily.      . Multiple Vitamin (MULITIVITAMIN WITH MINERALS) TABS Take 1 tablet by mouth daily.      . Multiple Vitamins-Minerals (ICAPS AREDS FORMULA PO) Take 1 capsule by mouth daily with breakfast.      . nitroGLYCERIN (NITRODUR - DOSED IN MG/24 HR) 0.2 mg/hr patch Place 0.2 mg onto the skin daily. On the foot      . nortriptyline (PAMELOR) 10 MG capsule Take 1 capsule (10 mg total) by mouth at bedtime. For neuropathy  30 capsule  1  . oxymetazoline (AFRIN) 0.05 % nasal spray Place 2 sprays into both nostrils 2 (two) times daily.       . potassium chloride SA (K-DUR,KLOR-CON) 20 MEQ tablet Take 20 mEq by  mouth 2 (two) times daily.      . protein supplement (RESOURCE BENEPROTEIN) POWD Take 1 scoop by mouth 3 (three) times daily with meals.       . tiotropium (SPIRIVA) 18 MCG inhalation capsule Place 18 mcg into inhaler and inhale daily with breakfast.       . vitamin C (ASCORBIC ACID) 500 MG tablet Take 500 mg by mouth daily.      Marland Kitchen warfarin (COUMADIN) 4 MG tablet Take 2-4 mg by mouth daily. Take $RemoveBef'2mg'CzjgPfDcbC$  on Monday ,Wednesday, Friday.  Take $Rem'4mg'dSVO$  on Tuesday, Thursday, Saturday, Sunday.      Marland Kitchen albuterol (PROVENTIL HFA;VENTOLIN HFA) 108 (90 BASE) MCG/ACT inhaler Inhale 1 puff into the lungs every 6 (six) hours as needed for wheezing or shortness of breath (or coughing).  1 Inhaler  0  . fluticasone (FLONASE) 50 MCG/ACT nasal spray Place 1 spray into both nostrils daily as needed for allergies or rhinitis. Use as needed for nasal congestion.  16 g  1    Results for orders placed during the hospital encounter of 11/10/13 (from the past 48 hour(s))  CBG MONITORING, ED     Status: Abnormal   Collection Time    11/10/13 11:42 AM      Result Value Ref Range   Glucose-Capillary 289 (*) 70 - 99 mg/dL  CBC WITH DIFFERENTIAL     Status: Abnormal   Collection Time    11/10/13 11:45 AM      Result Value Ref Range   WBC 20.2 (*) 4.0 - 10.5 K/uL   RBC 3.86 (*) 3.87 - 5.11 MIL/uL   Hemoglobin 11.6 (*) 12.0 - 15.0 g/dL   HCT 35.3 (*) 36.0 - 46.0 %   MCV 91.5  78.0 - 100.0 fL   MCH 30.1  26.0 - 34.0 pg   MCHC 32.9  30.0 - 36.0 g/dL   RDW 15.2  11.5 - 15.5 %   Platelets 258  150 - 400 K/uL   Neutrophils Relative % 93 (*) 43 - 77 %   Neutro Abs 18.9 (*) 1.7 - 7.7 K/uL   Lymphocytes Relative 3 (*) 12 - 46 %   Lymphs Abs 0.6 (*) 0.7 - 4.0 K/uL   Monocytes Relative 4  3 - 12 %   Monocytes Absolute 0.7  0.1 - 1.0 K/uL   Eosinophils Relative 0  0 - 5 %   Eosinophils Absolute 0.0  0.0 - 0.7 K/uL   Basophils Relative 0  0 - 1 %   Basophils  Absolute 0.0  0.0 - 0.1 K/uL  COMPREHENSIVE METABOLIC PANEL     Status: Abnormal   Collection Time    11/10/13 11:45 AM      Result Value Ref Range   Sodium 140  137 - 147 mEq/L   Potassium 4.0  3.7 - 5.3 mEq/L   Chloride 95 (*) 96 - 112 mEq/L   CO2 27  19 - 32 mEq/L   Glucose, Bld 288 (*) 70 - 99 mg/dL   BUN 28 (*) 6 - 23 mg/dL   Creatinine, Ser 0.95  0.50 - 1.10 mg/dL   Calcium 9.4  8.4 - 10.5 mg/dL   Total Protein 7.3  6.0 - 8.3 g/dL   Albumin 3.4 (*) 3.5 - 5.2 g/dL   AST 17  0 - 37 U/L   ALT 9  0 - 35 U/L   Alkaline Phosphatase 69  39 - 117 U/L   Total Bilirubin 0.7  0.3 - 1.2  mg/dL   GFR calc non Af Amer 51 (*) >90 mL/min   GFR calc Af Amer 59 (*) >90 mL/min   Comment: (NOTE)     The eGFR has been calculated using the CKD EPI equation.     This calculation has not been validated in all clinical situations.     eGFR's persistently <90 mL/min signify possible Chronic Kidney     Disease.  PRO B NATRIURETIC PEPTIDE     Status: Abnormal   Collection Time    11/10/13 11:45 AM      Result Value Ref Range   Pro B Natriuretic peptide (BNP) 2285.0 (*) 0 - 450 pg/mL  TROPONIN I     Status: None   Collection Time    11/10/13 11:45 AM      Result Value Ref Range   Troponin I <0.30  <0.30 ng/mL   Comment:            Due to the release kinetics of cTnI,     a negative result within the first hours     of the onset of symptoms does not rule out     myocardial infarction with certainty.     If myocardial infarction is still suspected,     repeat the test at appropriate intervals.  PROTIME-INR     Status: Abnormal   Collection Time    11/10/13 11:45 AM      Result Value Ref Range   Prothrombin Time 33.4 (*) 11.6 - 15.2 seconds   INR 3.44 (*) 0.00 - 1.49  APTT     Status: Abnormal   Collection Time    11/10/13 11:45 AM      Result Value Ref Range   aPTT 59 (*) 24 - 37 seconds   Comment:            IF BASELINE aPTT IS ELEVATED,     SUGGEST PATIENT RISK ASSESSMENT     BE USED  TO DETERMINE APPROPRIATE     ANTICOAGULANT THERAPY.  LIPASE, BLOOD     Status: None   Collection Time    11/10/13 11:45 AM      Result Value Ref Range   Lipase 15  11 - 59 U/L  URINALYSIS, ROUTINE W REFLEX MICROSCOPIC     Status: Abnormal   Collection Time    11/10/13 12:10 PM      Result Value Ref Range   Color, Urine YELLOW  YELLOW   APPearance CLEAR  CLEAR   Specific Gravity, Urine 1.013  1.005 - 1.030   pH 5.0  5.0 - 8.0   Glucose, UA NEGATIVE  NEGATIVE mg/dL   Hgb urine dipstick TRACE (*) NEGATIVE   Bilirubin Urine NEGATIVE  NEGATIVE   Ketones, ur NEGATIVE  NEGATIVE mg/dL   Protein, ur NEGATIVE  NEGATIVE mg/dL   Urobilinogen, UA 0.2  0.0 - 1.0 mg/dL   Nitrite NEGATIVE  NEGATIVE   Leukocytes, UA NEGATIVE  NEGATIVE  URINE MICROSCOPIC-ADD ON     Status: None   Collection Time    11/10/13 12:10 PM      Result Value Ref Range   Squamous Epithelial / LPF RARE  RARE   WBC, UA 3-6  <3 WBC/hpf   RBC / HPF 0-2  <3 RBC/hpf   Bacteria, UA RARE  RARE  I-STAT CG4 LACTIC ACID, ED     Status: Abnormal   Collection Time    11/10/13  1:53 PM      Result Value  Ref Range   Lactic Acid, Venous 3.19 (*) 0.5 - 2.2 mmol/L  GLUCOSE, CAPILLARY     Status: Abnormal   Collection Time    11/10/13  4:13 PM      Result Value Ref Range   Glucose-Capillary 276 (*) 70 - 99 mg/dL   Ct Head Wo Contrast  11/10/2013   CLINICAL DATA:  Altered mental status, fever, shortness of breath and confusion.  EXAM: CT HEAD WITHOUT CONTRAST  TECHNIQUE: Contiguous axial images were obtained from the base of the skull through the vertex without intravenous contrast.  COMPARISON:  CT HEAD W/O CM dated 06/12/2010; MR HEAD WO/W CM dated 12/01/2007  FINDINGS: There is diffuse cortical atrophy which shows worsening since 2011. Moderately advanced small vessel ischemic changes are seen in the periventricular white matter which have progressed significantly since 2011. The brain demonstrates no evidence of hemorrhage, acute  infarction, edema, mass effect, extra-axial fluid collection, hydrocephalus or mass lesion. The skull is unremarkable.  IMPRESSION: Progressive cortical atrophy and small vessel disease since 2011. No acute findings are identified by head CT.   Electronically Signed   By: Aletta Edouard M.D.   On: 11/10/2013 12:35   Dg Chest Port 1 View  11/10/2013   CLINICAL DATA:  Fever and chest pain.  EXAM: PORTABLE CHEST - 1 VIEW  COMPARISON:  DG CHEST 2 VIEW dated 06/18/2013; DG CHEST 2 VIEW dated 06/06/2013; DG CHEST 2 VIEW dated 10/16/2011  FINDINGS: There is a right upper lobe infiltrate present consistent with pneumonia. No edema or pleural fluid is identified. The heart size is normal.  IMPRESSION: Right upper lobe pneumonia.   Electronically Signed   By: Aletta Edouard M.D.   On: 11/10/2013 13:37    ROS:as per hpi  Blood pressure 106/55, pulse 112, temperature 99.3 F (37.4 C), temperature source Oral, resp. rate 24, weight 68.584 kg (151 lb 3.2 oz), SpO2 100.00%.  age appropriate female, appears weak. she has slight increased work of breathing.  Heart is tachy, no m/r/g. abd soft , not tender, no mass or hsm.  lungs reveal dense consoidation sounds in both bases and in the right  upper long zone. only the left upper lobe area sounds relatively cta.  she has a bka on the left and a right foot amputation.  moe times four. she is grossly neurologically intact.  Assessment/Plan 80 yo female with multiple medical problems presenting with pneumonia.  It is unclear whether or not she aspirated when she had the episode of nausea and vomiting this morning but that is certainly possible.  We will admit to stepdown unit.   Will continue home meds to the best of our ability.  We will cover her with zosyn and vancomycin. Continue oxygen supplementation as well.  We will get a bedside swallow evaluation and go from there. She needs continued wound care for her right heel wound. We will address code  status.  Jerlyn Ly, MD 11/10/2013, 5:51 PM

## 2013-11-10 NOTE — ED Notes (Signed)
Family at the bedside.

## 2013-11-10 NOTE — Progress Notes (Addendum)
ANTIBIOTIC CONSULT NOTE - INITIAL  Pharmacy Consult for Cefepime and Vancomycin x 8 days Indication: HCAP  Allergies  Allergen Reactions  . Other     Narcotics=makes her very disoriented  . Percocet [Oxycodone-Acetaminophen] Other (See Comments)    Dilerium, hallucinations  . Tramadol Anxiety  . Vicodin [Hydrocodone-Acetaminophen] Anxiety    Patient Measurements: Weight: 166 lb (75.297 kg)  Vital Signs: Temp: 102.5 F (39.2 C) (03/30 1339) Temp src: Rectal (03/30 1210) BP: 123/71 mmHg (03/30 1200) Pulse Rate: 115 (03/30 1315) Intake/Output from previous day:   Intake/Output from this shift:    Labs:  Recent Labs  11/10/13 1145  WBC 20.2*  HGB 11.6*  PLT 258  CREATININE 0.95   The CrCl is unknown because both a height and weight (above a minimum accepted value) are required for this calculation. No results found for this basename: VANCOTROUGH, VANCOPEAK, VANCORANDOM, GENTTROUGH, GENTPEAK, GENTRANDOM, TOBRATROUGH, TOBRAPEAK, TOBRARND, AMIKACINPEAK, AMIKACINTROU, AMIKACIN,  in the last 72 hours   Microbiology: No results found for this or any previous visit (from the past 720 hour(s)).  Medical History: Past Medical History  Diagnosis Date  . Lung disease     BRONCHOSPASTIC  . Neuropathy     takes Gabapentin bid  . Hypercholesterolemia     doesn't require meds for this  . Anemia   . COPD (chronic obstructive pulmonary disease)   . Blood transfusion ~ 2012    "once; not related to a surgery or bleeding that I remember" (06/06/2013)  . Hypertension     takes Prinizide daily  . Chronic atrial fibrillation     takes Coumadin as instructed  . Peripheral vascular disease   . CHF (congestive heart failure)     takes Lasix every other day  . Emphysema   . History of head injury     many yrs ago  . Vaginal discharge     has seen GYN but says its nothing to worry about  . Diabetes mellitus     takes Metformin and Amaryl daily  . Hypothyroidism     takes  Synthroid daily  . Macular degeneration     dry  . Bronchial pneumonia     "once; several years ago" (06/06/2013)  . Rheumatoid arthritis     "shoulders; daughter gives me shots of Methotrexate q week" (06/06/2013)    Medications:  See electronic med rec  Assessment: 80 y.o. female presents from [facility] with SOB, AMS. To begin broad spectrum antibiotics (Vanc and Cefepime) x 8 days for HCAP. Wbc elevated. Tm 102.5. SCr 0.95, est CrCl 40 ml/min.  Goal of Therapy:  Vancomycin trough level 15-20 mcg/ml  Plan:  1. Cefepime 1gm IV q24h x 8 days 2. Vancomycin 1gm IV q24h x 8 days 3. Will f/u micro data, pt's clinical condition, renal function, vanc trough prn 4. F/u continuation of coumadin when admitted  Sherlon Handing, PharmD, [facility] pharmacist, pager [phone] 11/10/2013,1:53 PM   Addendum: 1.  Changing IV Cefepime to Zosyn.  This is in limited supply however, we currently have some in stock.  Will give Zosyn 3.375gm IV q8 hours to infuse over 4 hours.  We may need to use alternative for anaerobe coverage if Zosyn supply is diminished.  Rober Minion, PharmD., MS Clinical Pharmacist Pager:  [phone] Thank you for allowing pharmacy to be part of this patients care team.

## 2013-11-10 NOTE — ED Notes (Signed)
Pt to department via EMS from [facility]- staff reports that patient has been SOB, and not acting herself. Family also reports that she doesn't seem like herself. Pt Alert and oriented on arrival. Bp-150/100 Hr-110 RR-18 O2-92 CBG-237

## 2013-11-10 NOTE — ED Notes (Signed)
Patient is resting comfortably. 

## 2013-11-10 NOTE — ED Provider Notes (Signed)
CSN: [id number]     Arrival date & time 11/10/13  1035 History   First MD Initiated Contact with Patient 11/10/13 1035     Chief Complaint  Patient presents with  . Altered Mental Status     (Consider location/radiation/quality/duration/timing/severity/associated sxs/prior Treatment) The history is provided by the patient and a relative. No language interpreter was used.  Sydney [name] is a 80 y/o F with PMHx of CHF, chronic atrial fibrillation on warfarin, COPD - emphysema with no oxygen therapy at home, DM, lung disease, resident at [facility] presenting to the ED with alerted mental status with unknown length of time when change occurred. Patient presents to the ED with daughter and son-in-law. Reported that they received a phone call from caretakers this morning reporting that patient was not doing well. As per daughter's report, stated that mother has been vomiting all morning. Reported that patient has been mildly confused with gargled speech. Reported that she has been having issues with finding the words to say. Patient denied chest pain, shortness of breath, difficulty breathing. Family denied recent fall or injury. Level 5 caveat.  PCP Dr. Reynaldo Minium  Past Medical History  Diagnosis Date  . Lung disease     BRONCHOSPASTIC  . Neuropathy     takes Gabapentin bid  . Hypercholesterolemia     doesn't require meds for this  . Anemia   . COPD (chronic obstructive pulmonary disease)   . Blood transfusion ~ 2012    "once; not related to a surgery or bleeding that I remember" (06/06/2013)  . Hypertension     takes Prinizide daily  . Chronic atrial fibrillation     takes Coumadin as instructed  . Peripheral vascular disease   . CHF (congestive heart failure)     takes Lasix every other day  . Emphysema   . History of head injury     many yrs ago  . Vaginal discharge     has seen GYN but says its nothing to worry about  . Diabetes mellitus     takes Metformin and Amaryl  daily  . Hypothyroidism     takes Synthroid daily  . Macular degeneration     dry  . Bronchial pneumonia     "once; several years ago" (06/06/2013)  . Rheumatoid arthritis     "shoulders; daughter gives me shots of Methotrexate q week" (06/06/2013)   Past Surgical History  Procedure Laterality Date  . Total hip arthroplasty Left ?2013  . Back surgery    . Fracture surgery  2011    LEFT HIP FX /REPLACEMENT   . Cholecystectomy  1951  . Dilation and curettage of uterus    . Amputation  11/09/2011    Procedure: AMPUTATION DIGIT;  Surgeon: Mcarthur Rossetti, MD;  Location: [facility];  Service: Orthopedics;  Laterality: Right;  Right Great Toe Amputation  . Amputation  01/05/2012    Procedure: AMPUTATION RAY;  Surgeon: Mcarthur Rossetti, MD;  Location: [facility];  Service: Orthopedics;  Laterality: Left;  Amputation Left 4th and 5th Toes  . Cardioversion    . Amputation  05/31/2012    Procedure: AMPUTATION RAY;  Surgeon: Newt Minion, MD;  Location: [facility];  Service: Orthopedics;  Laterality: Left;  Left foot 1st ray amp  . Amputation  06/21/2012    Procedure: AMPUTATION BELOW KNEE;  Surgeon: Newt Minion, MD;  Location: [facility];  Service: Orthopedics;  Laterality: Left;  Left Below Knee Amputation  .  Foot amputation through metatarsal Right 06/06/2013    midfoot/notes 06/06/2013  . Joint replacement    . Appendectomy    . Total abdominal hysterectomy  1975  . Cervical disc surgery      "the top vertebrae" (06/06/2013)  . Cataract extraction w/ intraocular lens  implant, bilateral Bilateral   . Eye surgery      bilateral -cataract surgery  . Amputation Right 06/06/2013    Procedure: RIGHT MIDFOOT AMPUTATION;  Surgeon: Newt Minion, MD;  Location: [facility];  Service: Orthopedics;  Laterality: Right;   Family History  Problem Relation Age of Onset  . Heart disease Mother   . Stroke Mother   . Heart disease Father   . Stroke Father   . Heart attack Father   . Muscular dystrophy  Brother   . Anesthesia problems Neg Hx   . Hypotension Neg Hx   . Malignant hyperthermia Neg Hx   . Pseudochol deficiency Neg Hx    History  Substance Use Topics  . Smoking status: Former Smoker -- 4 years    Types: Cigarettes  . Smokeless tobacco: Never Used     Comment: 06/06/2013 "smoked a little in my teens"  . Alcohol Use: No   OB History   Grav Para Term Preterm Abortions TAB SAB Ect Mult Living                 Review of Systems  Unable to perform ROS: Mental status change  Constitutional: Positive for fever. Negative for chills.  Respiratory: Negative for chest tightness and shortness of breath.   Gastrointestinal: Positive for vomiting.  Level 5 Caveat     Allergies  Other; Percocet; Tramadol; and Vicodin  Home Medications   No current outpatient prescriptions on file. BP 106/55  Pulse 112  Temp(Src) 99.3 F (37.4 C) (Oral)  Resp 24  Wt 151 lb 3.2 oz (68.584 kg)  SpO2 100% Physical Exam  Nursing note and vitals reviewed. Constitutional: She appears well-developed and well-nourished. No distress.  HENT:  Head: Normocephalic and atraumatic.  Dry mucous membranes noted  Eyes: Conjunctivae and EOM are normal. Pupils are equal, round, and reactive to light. Right eye exhibits no discharge. Left eye exhibits no discharge.  Neck: Normal range of motion. Neck supple. No tracheal deviation present.  Negative neck stiffness Negative nuchal rigidity Negative cervical lymphadenopathy Negative meningeal signs  Cardiovascular: Normal rate, regular rhythm and normal heart sounds.  Exam reveals no friction rub.   No murmur heard. Radial pulses 2+ bilaterally  Pulmonary/Chest: Effort normal and breath sounds normal. No respiratory distress. She has no wheezes. She has no rales.  Abdominal: Soft. Bowel sounds are normal. There is tenderness. There is no guarding.  Mild discomfort upon palpation to the suprapubic region and lower abdomen bilaterally  Musculoskeletal:   Left BTK amputation  Partial right foot amputation - wrapped in ACE bandage secondary to pressure ulcer.   Lymphadenopathy:    She has no cervical adenopathy.  Neurological: She is alert. No cranial nerve deficit. She exhibits normal muscle tone. Coordination normal.  Cranial nerves III-XII grossly intact Strength 4+/5+ to upper extremities bilaterally with resistance applied, equal distribution noted Patient follows commands well Patient is able to bring finger to nose bilaterally without difficulty or ataxia Gargled speech noted Negative facial drooping  Skin: Skin is warm and dry. No rash noted. She is not diaphoretic. No erythema.    ED Course  Procedures (including critical care time)  12:58 PM Attending physician  speaking with Dr. Joylene Draft regarding patient for admission.   1:46 PM Patient did not pass swallow screen - discussed this with attending physician regarding MR of brain should be ordered - attending did not recommend this at this time since this gargled speech has been ongoing for a while now.   Results for orders placed during the hospital encounter of 11/10/13  MRSA PCR SCREENING      Result Value Ref Range   MRSA by PCR NEGATIVE  NEGATIVE  CBC WITH DIFFERENTIAL      Result Value Ref Range   WBC 20.2 (*) 4.0 - 10.5 K/uL   RBC 3.86 (*) 3.87 - 5.11 MIL/uL   Hemoglobin 11.6 (*) 12.0 - 15.0 g/dL   HCT 35.3 (*) 36.0 - 46.0 %   MCV 91.5  78.0 - 100.0 fL   MCH 30.1  26.0 - 34.0 pg   MCHC 32.9  30.0 - 36.0 g/dL   RDW 15.2  11.5 - 15.5 %   Platelets 258  150 - 400 K/uL   Neutrophils Relative % 93 (*) 43 - 77 %   Neutro Abs 18.9 (*) 1.7 - 7.7 K/uL   Lymphocytes Relative 3 (*) 12 - 46 %   Lymphs Abs 0.6 (*) 0.7 - 4.0 K/uL   Monocytes Relative 4  3 - 12 %   Monocytes Absolute 0.7  0.1 - 1.0 K/uL   Eosinophils Relative 0  0 - 5 %   Eosinophils Absolute 0.0  0.0 - 0.7 K/uL   Basophils Relative 0  0 - 1 %   Basophils Absolute 0.0  0.0 - 0.1 K/uL  COMPREHENSIVE  METABOLIC PANEL      Result Value Ref Range   Sodium 140  137 - 147 mEq/L   Potassium 4.0  3.7 - 5.3 mEq/L   Chloride 95 (*) 96 - 112 mEq/L   CO2 27  19 - 32 mEq/L   Glucose, Bld 288 (*) 70 - 99 mg/dL   BUN 28 (*) 6 - 23 mg/dL   Creatinine, Ser 0.95  0.50 - 1.10 mg/dL   Calcium 9.4  8.4 - 10.5 mg/dL   Total Protein 7.3  6.0 - 8.3 g/dL   Albumin 3.4 (*) 3.5 - 5.2 g/dL   AST 17  0 - 37 U/L   ALT 9  0 - 35 U/L   Alkaline Phosphatase 69  39 - 117 U/L   Total Bilirubin 0.7  0.3 - 1.2 mg/dL   GFR calc non Af Amer 51 (*) >90 mL/min   GFR calc Af Amer 59 (*) >90 mL/min  URINALYSIS, ROUTINE W REFLEX MICROSCOPIC      Result Value Ref Range   Color, Urine YELLOW  YELLOW   APPearance CLEAR  CLEAR   Specific Gravity, Urine 1.013  1.005 - 1.030   pH 5.0  5.0 - 8.0   Glucose, UA NEGATIVE  NEGATIVE mg/dL   Hgb urine dipstick TRACE (*) NEGATIVE   Bilirubin Urine NEGATIVE  NEGATIVE   Ketones, ur NEGATIVE  NEGATIVE mg/dL   Protein, ur NEGATIVE  NEGATIVE mg/dL   Urobilinogen, UA 0.2  0.0 - 1.0 mg/dL   Nitrite NEGATIVE  NEGATIVE   Leukocytes, UA NEGATIVE  NEGATIVE  PRO B NATRIURETIC PEPTIDE      Result Value Ref Range   Pro B Natriuretic peptide (BNP) 2285.0 (*) 0 - 450 pg/mL  TROPONIN I      Result Value Ref Range   Troponin I <0.30  <0.30 ng/mL  PROTIME-INR      Result Value Ref Range   Prothrombin Time 33.4 (*) 11.6 - 15.2 seconds   INR 3.44 (*) 0.00 - 1.49  APTT      Result Value Ref Range   aPTT 59 (*) 24 - 37 seconds  LIPASE, BLOOD      Result Value Ref Range   Lipase 15  11 - 59 U/L  URINE MICROSCOPIC-ADD ON      Result Value Ref Range   Squamous Epithelial / LPF RARE  RARE   WBC, UA 3-6  <3 WBC/hpf   RBC / HPF 0-2  <3 RBC/hpf   Bacteria, UA RARE  RARE  GLUCOSE, CAPILLARY      Result Value Ref Range   Glucose-Capillary 276 (*) 70 - 99 mg/dL  CBG MONITORING, ED      Result Value Ref Range   Glucose-Capillary 289 (*) 70 - 99 mg/dL  I-STAT CG4 LACTIC ACID, ED      Result  Value Ref Range   Lactic Acid, Venous 3.19 (*) 0.5 - 2.2 mmol/L     Labs Review Labs Reviewed  CBC WITH DIFFERENTIAL - Abnormal; Notable for the following:    WBC 20.2 (*)    RBC 3.86 (*)    Hemoglobin 11.6 (*)    HCT 35.3 (*)    Neutrophils Relative % 93 (*)    Neutro Abs 18.9 (*)    Lymphocytes Relative 3 (*)    Lymphs Abs 0.6 (*)    All other components within normal limits  COMPREHENSIVE METABOLIC PANEL - Abnormal; Notable for the following:    Chloride 95 (*)    Glucose, Bld 288 (*)    BUN 28 (*)    Albumin 3.4 (*)    GFR calc non Af Amer 51 (*)    GFR calc Af Amer 59 (*)    All other components within normal limits  URINALYSIS, ROUTINE W REFLEX MICROSCOPIC - Abnormal; Notable for the following:    Hgb urine dipstick TRACE (*)    All other components within normal limits  PRO B NATRIURETIC PEPTIDE - Abnormal; Notable for the following:    Pro B Natriuretic peptide (BNP) 2285.0 (*)    All other components within normal limits  PROTIME-INR - Abnormal; Notable for the following:    Prothrombin Time 33.4 (*)    INR 3.44 (*)    All other components within normal limits  APTT - Abnormal; Notable for the following:    aPTT 59 (*)    All other components within normal limits  GLUCOSE, CAPILLARY - Abnormal; Notable for the following:    Glucose-Capillary 276 (*)    All other components within normal limits  CBG MONITORING, ED - Abnormal; Notable for the following:    Glucose-Capillary 289 (*)    All other components within normal limits  I-STAT CG4 LACTIC ACID, ED - Abnormal; Notable for the following:    Lactic Acid, Venous 3.19 (*)    All other components within normal limits  MRSA PCR SCREENING  CULTURE, BLOOD (ROUTINE X 2)  CULTURE, BLOOD (ROUTINE X 2)  TROPONIN I  LIPASE, BLOOD  URINE MICROSCOPIC-ADD ON  COMPREHENSIVE METABOLIC PANEL  MAGNESIUM  PHOSPHORUS  CBC WITH DIFFERENTIAL  PROTIME-INR  HEMOGLOBIN A1C  I-STAT CG4 LACTIC ACID, ED   Imaging Review Ct  Head Wo Contrast  11/10/2013   CLINICAL DATA:  Altered mental status, fever, shortness of breath and confusion.  EXAM: CT HEAD WITHOUT CONTRAST  TECHNIQUE:  Contiguous axial images were obtained from the base of the skull through the vertex without intravenous contrast.  COMPARISON:  CT HEAD W/O CM dated 06/12/2010; MR HEAD WO/W CM dated 12/01/2007  FINDINGS: There is diffuse cortical atrophy which shows worsening since 2011. Moderately advanced small vessel ischemic changes are seen in the periventricular white matter which have progressed significantly since 2011. The brain demonstrates no evidence of hemorrhage, acute infarction, edema, mass effect, extra-axial fluid collection, hydrocephalus or mass lesion. The skull is unremarkable.  IMPRESSION: Progressive cortical atrophy and small vessel disease since 2011. No acute findings are identified by head CT.   Electronically Signed   By: Aletta Edouard M.D.   On: 11/10/2013 12:35   Dg Chest Port 1 View  11/10/2013   CLINICAL DATA:  Fever and chest pain.  EXAM: PORTABLE CHEST - 1 VIEW  COMPARISON:  DG CHEST 2 VIEW dated 06/18/2013; DG CHEST 2 VIEW dated 06/06/2013; DG CHEST 2 VIEW dated 10/16/2011  FINDINGS: There is a right upper lobe infiltrate present consistent with pneumonia. No edema or pleural fluid is identified. The heart size is normal.  IMPRESSION: Right upper lobe pneumonia.   Electronically Signed   By: Aletta Edouard M.D.   On: 11/10/2013 13:37     EKG Interpretation   Date/Time:  Monday November 10 2013 10:53:00 EDT Ventricular Rate:  126 PR Interval:    QRS Duration: 81 QT Interval:  314 QTC Calculation: 455 R Axis:   63 Text Interpretation:  Atrial fibrillation Ventricular premature complex  Low voltage, precordial leads RSR' in V1 or V2, probably normal variant  Repolarization abnormality, prob rate related Confirmed by GOLDSTON  MD,  SCOTT ([id number]) on 11/10/2013 11:31:49 AM      MDM   Final diagnoses:  Sepsis  HCAP  (healthcare-associated pneumonia)  Altered mental status   Medications  vancomycin (VANCOCIN) IVPB 1000 mg/200 mL premix (1,000 mg Intravenous Given 11/10/13 1832)  cadexomer iodine (IODOSORB) 0.9 % gel 1 application (not administered)  guaifenesin (ROBITUSSIN) 100 MG/5ML syrup 200 mg (not administered)  albuterol (PROVENTIL) (2.5 MG/3ML) 0.083% nebulizer solution 3 mL (not administered)  fluticasone (FLONASE) 50 MCG/ACT nasal spray 1 spray (not administered)  nortriptyline (PAMELOR) capsule 10 mg (not administered)  gabapentin (NEURONTIN) capsule 300 mg (not administered)  cyanocobalamin tablet 500 mcg (not administered)  folic acid (FOLVITE) tablet 1 mg (not administered)  levothyroxine (SYNTHROID, LEVOTHROID) tablet 50 mcg (not administered)  tiotropium (SPIRIVA) inhalation capsule 18 mcg (not administered)  0.9 %  sodium chloride infusion ( Intravenous Rate/Dose Change 11/10/13 1833)  acetaminophen (TYLENOL) tablet 650 mg (not administered)    Or  acetaminophen (TYLENOL) suppository 650 mg (not administered)  senna-docusate (Senokot-S) tablet 1 tablet (not administered)  ondansetron (ZOFRAN) tablet 4 mg (not administered)    Or  ondansetron (ZOFRAN) injection 4 mg (not administered)  insulin aspart (novoLOG) injection 0-9 Units (not administered)  insulin aspart (novoLOG) injection 0-5 Units (not administered)  feeding supplement (PRO-STAT SUGAR FREE 64) liquid 30 mL (not administered)  nitroGLYCERIN (NITRODUR - Dosed in mg/24 hr) patch 0.2 mg (not administered)  cholecalciferol (VITAMIN D) tablet 400 Units (not administered)  piperacillin-tazobactam (ZOSYN) IVPB 3.375 g (not administered)  ceFEPIme (MAXIPIME) 1 g in dextrose 5 % 50 mL IVPB (1 g Intravenous New Bag/Given 11/10/13 1502)  azithromycin (ZITHROMAX) 500 mg in dextrose 5 % 250 mL IVPB (0 mg Intravenous Stopped 11/10/13 1513)  acetaminophen (TYLENOL) suppository 650 mg (650 mg Rectal Given 11/10/13 1357)   Filed Vitals:  11/10/13 1500 11/10/13 1509 11/10/13 1510 11/10/13 1622  BP: 106/55 106/55    Pulse: 92 112    Temp:  101.8 F (38.8 C) 101.8 F (38.8 C) 99.3 F (37.4 C)  TempSrc:  Rectal Oral   Resp: 30 24    Weight:    151 lb 3.2 oz (68.584 kg)  SpO2: 100% 100%      Patient presenting to the ED with altered mental status with unknown time frame of change. Family reported that patient has been having gargled speech this morning with difficulty findings the words to say. Reported that patient has had emesis this morning. Reported that patient has been getting over a head cold and has been having a lingering cough for the past couple of days.  Alert. Patient is able to follow commands properly. GCS 15. Heart rate irregularly irregular-chronic atrial fibrillation. Rate noted to be tachycardic. Radial pulses 2+. Lungs noted to be positive for rhonchi and rales to lower lobes bilaterally. Poor lung expansion noted. Negative cyanosis. Cranial nerves grossly intact. Patient is able to follow commands properly full range of motion to upper extremities bilaterally without difficulty noted. Patient is able to bring finger to nose bilaterally without difficulty. Left below-the-knee amputation noted as well as right foot partial amputation-wrapped with Ace bandage secondary to pressure ulcers that are monitored by orthopedics. Gargled speech with difficulty and hesitation response identified. Patient febrile, tachycardic, hypoxic with elevated white blood cell count-code sepsis called. EKG noted atrial fibrillation with ventricular premature complexes-patient in chronic A. fib. Troponin negative elevation. CBC noted elevated white blood cell count of 20.2. CBG to 89. Pro time is a 33.4, INR 3.44. APTT 59. CMP noted elevated BUN of 28. Elevated glucose of 288. Anion gap of 18.0 mEq per liter. BNP elevated at 2285.0. Urinalysis negative for nitrates or leukocytes-trace of hemoglobin identified. CT head noted progressive  cortical atrophy and small vessel disease since 2011-no acute findings are identified by CT head. Chest x-ray identified right upper lobe pneumonia. Patient started on controlled IV fluids secondary to congestive heart failure. Patient started on antibiotics regarding HCAP - since patient lives in assisted living center. Attending spoke with Dr. Joylene Draft - patient to be admitted to the hospital regarding sepsis and HCAP. Patient admitted to [facility] as inpatient - antibiotics switched to zosyn and vanco. Discussed plan of admission with family and agreed. Patient to be transferred.   Jamse Mead, PA-C 11/10/13 [place], PA-C 11/10/13 1941

## 2013-11-11 LAB — COMPREHENSIVE METABOLIC PANEL
ALK PHOS: 66 U/L (ref 39–117)
ALT: 7 U/L (ref 0–35)
AST: 15 U/L (ref 0–37)
Albumin: 2.8 g/dL — ABNORMAL LOW (ref 3.5–5.2)
BUN: 30 mg/dL — ABNORMAL HIGH (ref 6–23)
CO2: 27 mEq/L (ref 19–32)
Calcium: 8.7 mg/dL (ref 8.4–10.5)
Chloride: 100 mEq/L (ref 96–112)
Creatinine, Ser: 1.04 mg/dL (ref 0.50–1.10)
GFR calc Af Amer: 53 mL/min — ABNORMAL LOW (ref >90)
GFR calc non Af Amer: 46 mL/min — ABNORMAL LOW (ref >90)
GLUCOSE: 126 mg/dL — AB (ref 70–99)
Potassium: 3.3 mEq/L — ABNORMAL LOW (ref 3.7–5.3)
SODIUM: 142 meq/L (ref 137–147)
Total Bilirubin: 0.9 mg/dL (ref 0.3–1.2)
Total Protein: 6.1 g/dL (ref 6.0–8.3)

## 2013-11-11 LAB — CBC WITH DIFFERENTIAL/PLATELET
Basophils Absolute: 0 10*3/uL (ref 0.0–0.1)
Basophils Relative: 0 % (ref 0–1)
EOS ABS: 0 10*3/uL (ref 0.0–0.7)
Eosinophils Relative: 0 % (ref 0–5)
HEMATOCRIT: 27.2 % — AB (ref 36.0–46.0)
HEMOGLOBIN: 8.9 g/dL — AB (ref 12.0–15.0)
LYMPHS PCT: 16 % (ref 12–46)
Lymphs Abs: 2.3 10*3/uL (ref 0.7–4.0)
MCH: 30.3 pg (ref 26.0–34.0)
MCHC: 32.7 g/dL (ref 30.0–36.0)
MCV: 92.5 fL (ref 78.0–100.0)
MONOS PCT: 5 % (ref 3–12)
Monocytes Absolute: 0.7 10*3/uL (ref 0.1–1.0)
Neutro Abs: 11.6 10*3/uL — ABNORMAL HIGH (ref 1.7–7.7)
Neutrophils Relative %: 79 % — ABNORMAL HIGH (ref 43–77)
Platelets: 230 10*3/uL (ref 150–400)
RBC: 2.94 MIL/uL — ABNORMAL LOW (ref 3.87–5.11)
RDW: 15.4 % (ref 11.5–15.5)
WBC: 14.6 10*3/uL — ABNORMAL HIGH (ref 4.0–10.5)

## 2013-11-11 LAB — HEMOGLOBIN A1C
Hgb A1c MFr Bld: 7.4 % — ABNORMAL HIGH (ref <5.7)
Mean Plasma Glucose: 166 mg/dL — ABNORMAL HIGH (ref <117)

## 2013-11-11 LAB — GLUCOSE, CAPILLARY
GLUCOSE-CAPILLARY: 262 mg/dL — AB (ref 70–99)
GLUCOSE-CAPILLARY: 159 mg/dL — AB (ref 70–99)
GLUCOSE-CAPILLARY: 154 mg/dL — AB (ref 70–99)
Glucose-Capillary: 137 mg/dL — ABNORMAL HIGH (ref 70–99)

## 2013-11-11 LAB — PROTIME-INR
INR: 3.12 — ABNORMAL HIGH (ref 0.00–1.49)
Prothrombin Time: 31 seconds — ABNORMAL HIGH (ref 11.6–15.2)

## 2013-11-11 MED ORDER — INSULIN GLARGINE 100 UNIT/ML ~~LOC~~ SOLN
15.0000 [IU] | Freq: Every day | SUBCUTANEOUS | Status: DC
  Administered 2013-11-11 – 2013-11-12 (×2): 15 [IU] via SUBCUTANEOUS
  Filled 2013-11-11 (×3): qty 0.15

## 2013-11-11 MED ORDER — BENEPROTEIN PO POWD
1.0000 | Freq: Three times a day (TID) | ORAL | Status: DC
  Administered 2013-11-11 – 2013-11-18 (×14): 6 g via ORAL
  Filled 2013-11-11 (×2): qty 227

## 2013-11-11 MED ORDER — HYDROCODONE-HOMATROPINE 5-1.5 MG/5ML PO SYRP
5.0000 mL | ORAL_SOLUTION | ORAL | Status: DC | PRN
  Administered 2013-11-12 – 2013-11-13 (×2): 5 mL via ORAL
  Filled 2013-11-11 (×2): qty 5

## 2013-11-11 MED ORDER — POTASSIUM CHLORIDE CRYS ER 20 MEQ PO TBCR
20.0000 meq | EXTENDED_RELEASE_TABLET | Freq: Two times a day (BID) | ORAL | Status: DC
  Administered 2013-11-11 – 2013-11-16 (×11): 20 meq via ORAL
  Filled 2013-11-11 (×15): qty 1

## 2013-11-11 MED ORDER — INSULIN ASPART 100 UNIT/ML ~~LOC~~ SOLN
0.0000 [IU] | Freq: Three times a day (TID) | SUBCUTANEOUS | Status: DC
  Administered 2013-11-11: 2 [IU] via SUBCUTANEOUS
  Administered 2013-11-11: 5 [IU] via SUBCUTANEOUS
  Administered 2013-11-12: 7 [IU] via SUBCUTANEOUS
  Administered 2013-11-12: 1 [IU] via SUBCUTANEOUS
  Administered 2013-11-12: 2 [IU] via SUBCUTANEOUS
  Administered 2013-11-13: 1 [IU] via SUBCUTANEOUS
  Administered 2013-11-13: 3 [IU] via SUBCUTANEOUS
  Administered 2013-11-14 – 2013-11-15 (×3): 1 [IU] via SUBCUTANEOUS
  Administered 2013-11-15: 5 [IU] via SUBCUTANEOUS
  Administered 2013-11-15: 3 [IU] via SUBCUTANEOUS
  Administered 2013-11-16: 2 [IU] via SUBCUTANEOUS
  Administered 2013-11-16: 3 [IU] via SUBCUTANEOUS
  Administered 2013-11-16 – 2013-11-17 (×3): 1 [IU] via SUBCUTANEOUS
  Administered 2013-11-17: 2 [IU] via SUBCUTANEOUS

## 2013-11-11 NOTE — Progress Notes (Signed)
Subjective: Chest is sore from coughing. Breathing a bit better. Slept little. No chills or sweats.    Objective: Vital signs in last 24 hours: Temp:  [98 F (36.7 C)-102.5 F (39.2 C)] 98 F (36.7 C) (03/31 0346) Pulse Rate:  [79-130] 79 (03/31 0400) Resp:  [13-30] 23 (03/31 0400) BP: (97-163)/(43-82) 97/47 mmHg (03/31 0400) SpO2:  [81 %-100 %] 95 % (03/31 0400) Weight:  [68.584 kg (151 lb 3.2 oz)-75.297 kg (166 lb)] 68.584 kg (151 lb 3.2 oz) (03/31 0346)  Intake/Output from previous day: 03/30 0701 - 03/31 0700 In: 1072.5 [I.V.:972.5; IV Piggyback:100] Out: -  Intake/Output this shift:    Sitting up non toxic oral membranes moist. Lungs left basilar rhonchi. Some wheeze. Ht irreg (paced on monitor) abd soft NT. No edema . Awake. mentating well  Lab Results   Recent Labs  11/10/13 1145 11/11/13 0255  WBC 20.2* 14.6*  RBC 3.86* 2.94*  HGB 11.6* 8.9*  HCT 35.3* 27.2*  MCV 91.5 92.5  MCH 30.1 30.3  RDW 15.2 15.4  PLT 258 230    Recent Labs  11/10/13 1145 11/11/13 0255  NA 140 142  K 4.0 3.3*  CL 95* 100  CO2 27 27  GLUCOSE 288* 126*  BUN 28* 30*  CREATININE 0.95 1.04  CALCIUM 9.4 8.7  Results for SHALETA, RUACHO (MRN [id number]) as of 11/11/2013 07:55  Ref. Range 11/10/2013 11:45  Troponin I Latest Range: <0.30 ng/mL <0.30  Pro B Natriuretic peptide (BNP) Latest Range: 0-450 pg/mL 2285.0 (H)    Studies/Results: Ct Head Wo Contrast  11/10/2013   CLINICAL DATA:  Altered mental status, fever, shortness of breath and confusion.  EXAM: CT HEAD WITHOUT CONTRAST  TECHNIQUE: Contiguous axial images were obtained from the base of the skull through the vertex without intravenous contrast.  COMPARISON:  CT HEAD W/O CM dated 06/12/2010; MR HEAD WO/W CM dated 12/01/2007  FINDINGS: There is diffuse cortical atrophy which shows worsening since 2011. Moderately advanced small vessel ischemic changes are seen in the periventricular white matter which have progressed significantly  since 2011. The brain demonstrates no evidence of hemorrhage, acute infarction, edema, mass effect, extra-axial fluid collection, hydrocephalus or mass lesion. The skull is unremarkable.  IMPRESSION: Progressive cortical atrophy and small vessel disease since 2011. No acute findings are identified by head CT.   Electronically Signed   By: Aletta Edouard M.D.   On: 11/10/2013 12:35   Dg Chest Port 1 View  11/10/2013   CLINICAL DATA:  Fever and chest pain.  EXAM: PORTABLE CHEST - 1 VIEW  COMPARISON:  DG CHEST 2 VIEW dated 06/18/2013; DG CHEST 2 VIEW dated 06/06/2013; DG CHEST 2 VIEW dated 10/16/2011  FINDINGS: There is a right upper lobe infiltrate present consistent with pneumonia. No edema or pleural fluid is identified. The heart size is normal.  IMPRESSION: Right upper lobe pneumonia.   Electronically Signed   By: Aletta Edouard M.D.   On: 11/10/2013 13:37    Scheduled Meds: . cholecalciferol  400 Units Oral Daily  . cyanocobalamin  500 mcg Oral Daily  . feeding supplement (PRO-STAT SUGAR FREE 64)  30 mL Oral Q breakfast  . folic acid  1 mg Oral Daily  . gabapentin  300 mg Oral BID  . insulin aspart  0-5 Units Subcutaneous QHS  . insulin aspart  0-9 Units Subcutaneous TID WC  . levothyroxine  50 mcg Oral Q breakfast  . nitroGLYCERIN  0.2 mg Transdermal Daily  . nortriptyline  10 mg Oral QHS  . piperacillin-tazobactam (ZOSYN)  IV  3.375 g Intravenous 3 times per day  . tiotropium  18 mcg Inhalation Q breakfast  . vancomycin  1,000 mg Intravenous Q24H   Continuous Infusions: . sodium chloride 50 mL/hr at 11/10/13 1833   PRN Meds:acetaminophen, acetaminophen, albuterol, cadexomer iodine, fluticasone, guaifenesin, ondansetron (ZOFRAN) IV, ondansetron, senna-docusate  Assessment/Plan: HCAP: WBC trending down. No zosyn and vanc.  AFIB/PACER: stbgle DM 2: BS's up some add. Some lantus HTN: BP fair COPD: continue Rx HYPOTHYROID: on Rx CAD: no cardiac CP  HYPOKALMEMIA: replace DM  NEPHROPATHY: sl better FULL CODE  LOS: 1 day   Sydney [name] ALAN 11/11/2013, 7:52 AM

## 2013-11-11 NOTE — Progress Notes (Addendum)
INITIAL NUTRITION ASSESSMENT  DOCUMENTATION CODES Per approved criteria  -Not Applicable   INTERVENTION:  D/C Prostat liquid protein  Beneprotein 1 scoop TID with meals, each supplement provides 25 kcals and 6 gm protein RD to follow for nutrition care plan  NUTRITION DIAGNOSIS: Increased nutrient needs related to acute illness, PNA as evidenced by estimated nutrition needs  Goal: Pt to meet >/= 90% of their estimated nutrition needs   Monitor:  PO & supplemental intake, weight, labs, I/O's  Reason for Assessment: Malnutrition Screening Tool Report  80 y.o. female  Admitting Dx: SOB, cough  ASSESSMENT: Patient is a 80 yo female with bilateral amputations; has lived at [facility] for the last 2 months and she likes it there; presented with two weeks of cough and cold symptoms. The cough has not improved at all. It is deep but not productive; developed nausea and vomiting with significant cyanosis; brought to the ER and found to be hypoxic and febrile.   RD spoke with pt and pt's daughter at bedside; pt's appetite is good; eating breakfast upon RD visit; weight has been stable; at ALF pt takes Beneprotein powder: 1 scoop TID in juice or milk; has Prostat liquid protein currently ordered, however, pt's daughter prefers pt receive Beneprotein during hospital stay -- RD to adjust orders.  Nutrition focused physical exam completed to upper body.  No muscle or subcutaneous fat depletion noticed.  Height: Ht Readings from Last 1 Encounters:  09/15/13 5\' 5"  (1.651 m)    Weight: Wt Readings from Last 1 Encounters:  11/11/13 151 lb 3.2 oz (68.584 kg)    Ideal Body Weight: 125 lb  % Ideal Body Weight: 115 lb (adjusted for BKA + midfoot amputation)  Wt Readings from Last 10 Encounters:  11/11/13 151 lb 3.2 oz (68.584 kg)  06/21/13 166 lb 10.7 oz (75.6 kg)  06/09/13 165 lb 5.5 oz (75 kg)  06/09/13 165 lb 5.5 oz (75 kg)  07/24/12 130 lb (58.968 kg)  07/03/12 130 lb 14.4 oz  (59.376 kg)  06/23/12 131 lb 13.4 oz (59.8 kg)  06/23/12 131 lb 13.4 oz (59.8 kg)  05/31/12 131 lb 13.4 oz (59.8 kg)  05/31/12 131 lb 13.4 oz (59.8 kg)    Usual Body Weight: 165 lb (prior to R midfoot amputation (06/06/13)  % Usual Body Weight: 91%  BMI:  25.3 kg/m2 (adjusted for amputations)  Estimated Nutritional Needs: Kcal: 1400-1600 Protein: 65-75 gm Fluid: >/= 1.5 L  Skin: R heel wound  Diet Order: Carb Control  EDUCATION NEEDS: -No education needs identified at this time   Intake/Output Summary (Last 24 hours) at 11/11/13 0957 Last data filed at 11/11/13 0900  Gross per 24 hour  Intake 1172.5 ml  Output    250 ml  Net  922.5 ml    Labs:   Recent Labs Lab 11/10/13 1145 11/10/13 2025 11/11/13 0255  NA 140  --  142  K 4.0  --  3.3*  CL 95*  --  100  CO2 27  --  27  BUN 28*  --  30*  CREATININE 0.95  --  1.04  CALCIUM 9.4  --  8.7  MG  --  1.3*  --   PHOS  --  3.3  --   GLUCOSE 288*  --  126*    CBG (last 3)   Recent Labs  11/10/13 1613 11/10/13 2132 11/11/13 0843  GLUCAP 276* 187* 137*    Scheduled Meds: . cholecalciferol  400 Units Oral  Daily  . cyanocobalamin  500 mcg Oral Daily  . feeding supplement (PRO-STAT SUGAR FREE 64)  30 mL Oral Q breakfast  . folic acid  1 mg Oral Daily  . gabapentin  300 mg Oral BID  . insulin aspart  0-5 Units Subcutaneous QHS  . insulin aspart  0-9 Units Subcutaneous TID WC  . insulin glargine  15 Units Subcutaneous QHS  . levothyroxine  50 mcg Oral Q breakfast  . nitroGLYCERIN  0.2 mg Transdermal Daily  . nortriptyline  10 mg Oral QHS  . piperacillin-tazobactam (ZOSYN)  IV  3.375 g Intravenous 3 times per day  . potassium chloride  20 mEq Oral BID  . tiotropium  18 mcg Inhalation Q breakfast  . vancomycin  1,000 mg Intravenous Q24H    Continuous Infusions: . sodium chloride 50 mL/hr at 11/11/13 0800    Past Medical History  Diagnosis Date  . Lung disease     BRONCHOSPASTIC  . Neuropathy      takes Gabapentin bid  . Hypercholesterolemia     doesn't require meds for this  . Anemia   . COPD (chronic obstructive pulmonary disease)   . Blood transfusion ~ 2012    "once; not related to a surgery or bleeding that I remember" (06/06/2013)  . Hypertension     takes Prinizide daily  . Chronic atrial fibrillation     takes Coumadin as instructed  . Peripheral vascular disease   . CHF (congestive heart failure)     takes Lasix every other day  . Emphysema   . History of head injury     many yrs ago  . Vaginal discharge     has seen GYN but says its nothing to worry about  . Diabetes mellitus     takes Metformin and Amaryl daily  . Hypothyroidism     takes Synthroid daily  . Macular degeneration     dry  . Bronchial pneumonia     "once; several years ago" (06/06/2013)  . Rheumatoid arthritis     "shoulders; daughter gives me shots of Methotrexate q week" (06/06/2013)    Past Surgical History  Procedure Laterality Date  . Total hip arthroplasty Left ?2013  . Back surgery    . Fracture surgery  2011    LEFT HIP FX /REPLACEMENT   . Cholecystectomy  1951  . Dilation and curettage of uterus    . Amputation  11/09/2011    Procedure: AMPUTATION DIGIT;  Surgeon: Mcarthur Rossetti, MD;  Location: [facility];  Service: Orthopedics;  Laterality: Right;  Right Great Toe Amputation  . Amputation  01/05/2012    Procedure: AMPUTATION RAY;  Surgeon: Mcarthur Rossetti, MD;  Location: [facility];  Service: Orthopedics;  Laterality: Left;  Amputation Left 4th and 5th Toes  . Cardioversion    . Amputation  05/31/2012    Procedure: AMPUTATION RAY;  Surgeon: Newt Minion, MD;  Location: [facility];  Service: Orthopedics;  Laterality: Left;  Left foot 1st ray amp  . Amputation  06/21/2012    Procedure: AMPUTATION BELOW KNEE;  Surgeon: Newt Minion, MD;  Location: [facility];  Service: Orthopedics;  Laterality: Left;  Left Below Knee Amputation  . Foot amputation through metatarsal Right 06/06/2013     midfoot/notes 06/06/2013  . Joint replacement    . Appendectomy    . Total abdominal hysterectomy  1975  . Cervical disc surgery      "the top vertebrae" (06/06/2013)  . Cataract extraction  w/ intraocular lens  implant, bilateral Bilateral   . Eye surgery      bilateral -cataract surgery  . Amputation Right 06/06/2013    Procedure: RIGHT MIDFOOT AMPUTATION;  Surgeon: Newt Minion, MD;  Location: [facility];  Service: Orthopedics;  Laterality: Right;    Arthur Holms, RD, LDN Pager #: [phone] After-Hours Pager #: [phone]

## 2013-11-11 NOTE — Discharge Instructions (Signed)
Information on my medicine - Coumadin®   (Warfarin) ° °This medication education was reviewed with me or my healthcare representative as part of my discharge preparation.  The pharmacist that spoke with me during my hospital stay was:  Geoffery Aultman P, RPH ° °Why was Coumadin prescribed for you? °Coumadin was prescribed for you because you have a blood clot or a medical condition that can cause an increased risk of forming blood clots. Blood clots can cause serious health problems by blocking the flow of blood to the heart, lung, or brain. Coumadin can prevent harmful blood clots from forming. °As a reminder your indication for Coumadin is:   Stroke Prevention Because Of Atrial Fibrillation ° °What test will check on my response to Coumadin? °While on Coumadin (warfarin) you will need to have an INR test regularly to ensure that your dose is keeping you in the desired range. The INR (international normalized ratio) number is calculated from the result of the laboratory test called prothrombin time (PT). ° °If an INR APPOINTMENT HAS NOT ALREADY BEEN MADE FOR YOU please schedule an appointment to have this lab work done by your health care provider within 7 days. °Your INR goal is usually a number between:  2 to 3 or your provider may give you a more narrow range like 2-2.5.  Ask your health care provider during an office visit what your goal INR is. ° °What  do you need to  know  About  COUMADIN? °Take Coumadin (warfarin) exactly as prescribed by your healthcare provider about the same time each day.  DO NOT stop taking without talking to the doctor who prescribed the medication.  Stopping without other blood clot prevention medication to take the place of Coumadin may increase your risk of developing a new clot or stroke.  Get refills before you run out. ° °What do you do if you miss a dose? °If you miss a dose, take it as soon as you remember on the same day then continue your regularly scheduled regimen the next day.   Do not take two doses of Coumadin at the same time. ° °Important Safety Information °A possible side effect of Coumadin (Warfarin) is an increased risk of bleeding. You should call your healthcare provider right away if you experience any of the following: °  Bleeding from an injury or your nose that does not stop. °  Unusual colored urine (red or dark brown) or unusual colored stools (red or black). °  Unusual bruising for unknown reasons. °  A serious fall or if you hit your head (even if there is no bleeding). ° °Some foods or medicines interact with Coumadin® (warfarin) and might alter your response to warfarin. To help avoid this: °  Eat a balanced diet, maintaining a consistent amount of Vitamin K. °  Notify your provider about major diet changes you plan to make. °  Avoid alcohol or limit your intake to 1 drink for women and 2 drinks for men per day. °(1 drink is 5 oz. wine, 12 oz. beer, or 1.5 oz. liquor.) ° °Make sure that ANY health care provider who prescribes medication for you knows that you are taking Coumadin (warfarin).  Also make sure the healthcare provider who is monitoring your Coumadin knows when you have started a new medication including herbals and non-prescription products. ° °Coumadin® (Warfarin)  Major Drug Interactions  °Increased Warfarin Effect Decreased Warfarin Effect  °Alcohol (large quantities) °Antibiotics (esp. Septra/Bactrim, Flagyl, Cipro) °Amiodarone (Cordarone) °Aspirin (  ASA) °Cimetidine (Tagamet) °Megestrol (Megace) °NSAIDs (ibuprofen, naproxen, etc.) °Piroxicam (Feldene) °Propafenone (Rythmol SR) °Propranolol (Inderal) °Isoniazid (INH) °Posaconazole (Noxafil) Barbiturates (Phenobarbital) °Carbamazepine (Tegretol) °Chlordiazepoxide (Librium) °Cholestyramine (Questran) °Griseofulvin °Oral Contraceptives °Rifampin °Sucralfate (Carafate) °Vitamin K  ° °Coumadin® (Warfarin) Major Herbal Interactions  °Increased Warfarin Effect Decreased Warfarin Effect  °Garlic °Ginseng °Ginkgo  biloba Coenzyme Q10 °Green tea °St. John’s wort   ° °Coumadin® (Warfarin) FOOD Interactions  °Eat a consistent number of servings per week of foods HIGH in Vitamin K °(1 serving = ½ cup)  °Collards (cooked, or boiled & drained) °Kale (cooked, or boiled & drained) °Mustard greens (cooked, or boiled & drained) °Parsley *serving size only = ¼ cup °Spinach (cooked, or boiled & drained) °Swiss chard (cooked, or boiled & drained) °Turnip greens (cooked, or boiled & drained)  °Eat a consistent number of servings per week of foods MEDIUM-HIGH in Vitamin K °(1 serving = 1 cup)  °Asparagus (cooked, or boiled & drained) °Broccoli (cooked, boiled & drained, or raw & chopped) °Brussel sprouts (cooked, or boiled & drained) *serving size only = ½ cup °Lettuce, raw (green leaf, endive, romaine) °Spinach, raw °Turnip greens, raw & chopped  ° °These websites have more information on Coumadin (warfarin):  www.coumadin.com; °www.ahrq.gov/consumer/coumadin.htm; ° ° °

## 2013-11-11 NOTE — Clinical Documentation Improvement (Signed)
PLEASE SPECIFY TYPE AND ACUITY OF CHF Possible Clinical Conditions?  Chronic Systolic Congestive Heart Failure Chronic Diastolic Congestive Heart Failure Chronic Systolic & Diastolic Congestive Heart Failure Acute Systolic Congestive Heart Failure Acute Diastolic Congestive Heart Failure Acute Systolic & Diastolic Congestive Heart Failure Acute on Chronic Systolic Congestive Heart Failure Acute on Chronic Diastolic Congestive Heart Failure Acute on Chronic Systolic & Diastolic Congestive Heart Failure Other Condition Cannot Clinically Determine  Supporting Information:(As per notes) "CHF (congestive heart failure) takes Lasix every other day"   Thank You, Alessandra Grout, RN, BSN, CCDS, Clinical Documentation Specialist:  [phone]   [facility] [employer] [facility]

## 2013-11-11 NOTE — Progress Notes (Signed)
ANTICOAGULATION CONSULT NOTE - Follow Up Consult  Pharmacy Consult for coumadin Indication: atrial fibrillation  Allergies  Allergen Reactions  . Other     Narcotics=makes her very disoriented  . Percocet [Oxycodone-Acetaminophen] Other (See Comments)    Dilerium, hallucinations  . Tramadol Anxiety  . Vicodin [Hydrocodone-Acetaminophen] Anxiety    Patient Measurements: Weight: 151 lb 3.2 oz (68.584 kg)   Vital Signs: Temp: 98.2 F (36.8 C) (03/31 0841) Temp src: Oral (03/31 0841) BP: 116/45 mmHg (03/31 0841) Pulse Rate: 82 (03/31 0841)  Labs:  Recent Labs  11/10/13 1145 11/11/13 0255  HGB 11.6* 8.9*  HCT 35.3* 27.2*  PLT 258 230  APTT 59*  --   LABPROT 33.4* 31.0*  INR 3.44* 3.12*  CREATININE 0.95 1.04  TROPONINI <0.30  --     The CrCl is unknown because both a height and weight (above a minimum accepted value) are required for this calculation.  Assessment: Patient is a 80 y.o F on coumadin for Afib.  INR remains supratheraputic at 3.12 today.  No bleeding documented.  Goal of Therapy:  INR 2-3    Plan:  1) will continue to hold coumadin for one more day 2) f/u with AM labs  Therese Rocco P 11/11/2013,11:38 AM

## 2013-11-11 NOTE — Progress Notes (Signed)
Utilization Review Completed.  

## 2013-11-12 LAB — GLUCOSE, CAPILLARY
GLUCOSE-CAPILLARY: 176 mg/dL — AB (ref 70–99)
Glucose-Capillary: 134 mg/dL — ABNORMAL HIGH (ref 70–99)
Glucose-Capillary: 309 mg/dL — ABNORMAL HIGH (ref 70–99)

## 2013-11-12 LAB — CBC WITH DIFFERENTIAL/PLATELET
Basophils Absolute: 0 10*3/uL (ref 0.0–0.1)
Basophils Relative: 0 % (ref 0–1)
EOS PCT: 1 % (ref 0–5)
Eosinophils Absolute: 0.2 10*3/uL (ref 0.0–0.7)
HCT: 25.8 % — ABNORMAL LOW (ref 36.0–46.0)
HEMOGLOBIN: 8.4 g/dL — AB (ref 12.0–15.0)
Lymphocytes Relative: 16 % (ref 12–46)
Lymphs Abs: 1.9 10*3/uL (ref 0.7–4.0)
MCH: 29.9 pg (ref 26.0–34.0)
MCHC: 32.6 g/dL (ref 30.0–36.0)
MCV: 91.8 fL (ref 78.0–100.0)
MONOS PCT: 6 % (ref 3–12)
Monocytes Absolute: 0.7 10*3/uL (ref 0.1–1.0)
NEUTROS ABS: 9 10*3/uL — AB (ref 1.7–7.7)
Neutrophils Relative %: 77 % (ref 43–77)
Platelets: 203 10*3/uL (ref 150–400)
RBC: 2.81 MIL/uL — ABNORMAL LOW (ref 3.87–5.11)
RDW: 15.1 % (ref 11.5–15.5)
WBC: 11.8 10*3/uL — ABNORMAL HIGH (ref 4.0–10.5)

## 2013-11-12 LAB — COMPREHENSIVE METABOLIC PANEL
ALK PHOS: 60 U/L (ref 39–117)
ALT: 8 U/L (ref 0–35)
AST: 16 U/L (ref 0–37)
Albumin: 2.6 g/dL — ABNORMAL LOW (ref 3.5–5.2)
BUN: 27 mg/dL — ABNORMAL HIGH (ref 6–23)
CALCIUM: 8.2 mg/dL — AB (ref 8.4–10.5)
CO2: 24 mEq/L (ref 19–32)
Chloride: 102 mEq/L (ref 96–112)
Creatinine, Ser: 1.03 mg/dL (ref 0.50–1.10)
GFR calc non Af Amer: 46 mL/min — ABNORMAL LOW (ref >90)
GFR, EST AFRICAN AMERICAN: 54 mL/min — AB (ref >90)
GLUCOSE: 133 mg/dL — AB (ref 70–99)
POTASSIUM: 3.8 meq/L (ref 3.7–5.3)
SODIUM: 141 meq/L (ref 137–147)
TOTAL PROTEIN: 5.8 g/dL — AB (ref 6.0–8.3)
Total Bilirubin: 0.7 mg/dL (ref 0.3–1.2)

## 2013-11-12 LAB — PROTIME-INR
INR: 3.12 — ABNORMAL HIGH (ref 0.00–1.49)
Prothrombin Time: 31 seconds — ABNORMAL HIGH (ref 11.6–15.2)

## 2013-11-12 MED ORDER — WARFARIN - PHARMACIST DOSING INPATIENT
Freq: Every day | Status: DC
  Administered 2013-11-17: 18:00:00

## 2013-11-12 MED ORDER — WARFARIN SODIUM 1 MG PO TABS
1.0000 mg | ORAL_TABLET | Freq: Once | ORAL | Status: AC
  Administered 2013-11-12: 1 mg via ORAL
  Filled 2013-11-12: qty 1

## 2013-11-12 NOTE — Progress Notes (Signed)
ANTICOAGULATION CONSULT NOTE - Follow Up Consult  Pharmacy Consult for coumadin Indication: atrial fibrillation  Allergies  Allergen Reactions  . Other     Narcotics=makes her very disoriented  . Percocet [Oxycodone-Acetaminophen] Other (See Comments)    Dilerium, hallucinations  . Tramadol Anxiety  . Vicodin [Hydrocodone-Acetaminophen] Anxiety    Patient Measurements: Height: 5\' 6"  (167.6 cm) Weight: 145 lb 1 oz (65.8 kg) IBW/kg (Calculated) : 59.3   Vital Signs: Temp: 97.9 F (36.6 C) (04/01 1159) Temp src: Oral (04/01 1159) BP: 150/64 mmHg (04/01 1159) Pulse Rate: 87 (04/01 0914)  Labs:  Recent Labs  11/10/13 1145 11/11/13 0255 11/12/13 0230  HGB 11.6* 8.9* 8.4*  HCT 35.3* 27.2* 25.8*  PLT 258 230 203  APTT 59*  --   --   LABPROT 33.4* 31.0* 31.0*  INR 3.44* 3.12* 3.12*  CREATININE 0.95 1.04 1.03  TROPONINI <0.30  --   --     Estimated Creatinine Clearance: 34 ml/min (by C-G formula based on Cr of 1.03).  Assessment: Patient is a 80 y.o F on coumadin for afib.  INR remains at 3.12 today. Hgb trending down to 8.4. No bleeding documented.  Goal of Therapy:  INR 2-3 Monitor platelets by anticoagulation protocol: Yes   Plan:  1) coumadin 1mg  PO x1 today  Shem Plemmons P 11/12/2013,1:20 PM

## 2013-11-12 NOTE — Progress Notes (Addendum)
Subjective: Had a fair night. No chills or sweats. Eating OK. Less coughing. No pain. Still fairly weak  Objective: Vital signs in last 24 hours: Temp:  [97.3 F (36.3 C)-98.5 F (36.9 C)] 97.3 F (36.3 C) (04/01 0741) Pulse Rate:  [82-105] 84 (04/01 0741) Resp:  [19-28] 28 (04/01 0741) BP: (116-151)/(45-79) 129/48 mmHg (04/01 0741) SpO2:  [94 %-100 %] 94 % (04/01 0741) Weight:  [65.8 kg (145 lb 1 oz)] 65.8 kg (145 lb 1 oz) (04/01 0400)  Intake/Output from previous day: 03/31 0701 - 04/01 0700 In: 1300 [I.V.:1150; IV Piggyback:150] Out: 6433 [Urine:1550] Intake/Output this shift:    Sitting up in no distress. Lungs: occas rhonchi, no accessory ms iin use. Ht IR IR, slow abd soft NT. Right leg in brace. Left BKA OK. Awake, mentating OK  Lab Results   Recent Labs  11/11/13 0255 11/12/13 0230  WBC 14.6* 11.8*  RBC 2.94* 2.81*  HGB 8.9* 8.4*  HCT 27.2* 25.8*  MCV 92.5 91.8  MCH 30.3 29.9  RDW 15.4 15.1  PLT 230 203    Recent Labs  11/11/13 0255 11/12/13 0230  NA 142 141  K 3.3* 3.8  CL 100 102  CO2 27 24  GLUCOSE 126* 133*  BUN 30* 27*  CREATININE 1.04 1.03  CALCIUM 8.7 8.2*    Studies/Results: Ct Head Wo Contrast  11/10/2013   CLINICAL DATA:  Altered mental status, fever, shortness of breath and confusion.  EXAM: CT HEAD WITHOUT CONTRAST  TECHNIQUE: Contiguous axial images were obtained from the base of the skull through the vertex without intravenous contrast.  COMPARISON:  CT HEAD W/O CM dated 06/12/2010; MR HEAD WO/W CM dated 12/01/2007  FINDINGS: There is diffuse cortical atrophy which shows worsening since 2011. Moderately advanced small vessel ischemic changes are seen in the periventricular white matter which have progressed significantly since 2011. The brain demonstrates no evidence of hemorrhage, acute infarction, edema, mass effect, extra-axial fluid collection, hydrocephalus or mass lesion. The skull is unremarkable.  IMPRESSION: Progressive cortical  atrophy and small vessel disease since 2011. No acute findings are identified by head CT.   Electronically Signed   By: Aletta Edouard M.D.   On: 11/10/2013 12:35   Dg Chest Port 1 View  11/10/2013   CLINICAL DATA:  Fever and chest pain.  EXAM: PORTABLE CHEST - 1 VIEW  COMPARISON:  DG CHEST 2 VIEW dated 06/18/2013; DG CHEST 2 VIEW dated 06/06/2013; DG CHEST 2 VIEW dated 10/16/2011  FINDINGS: There is a right upper lobe infiltrate present consistent with pneumonia. No edema or pleural fluid is identified. The heart size is normal.  IMPRESSION: Right upper lobe pneumonia.   Electronically Signed   By: Aletta Edouard M.D.   On: 11/10/2013 13:37    Scheduled Meds: . cholecalciferol  400 Units Oral Daily  . cyanocobalamin  500 mcg Oral Daily  . folic acid  1 mg Oral Daily  . gabapentin  300 mg Oral BID  . insulin aspart  0-5 Units Subcutaneous QHS  . insulin aspart  0-9 Units Subcutaneous TID WC  . insulin glargine  15 Units Subcutaneous QHS  . levothyroxine  50 mcg Oral Q breakfast  . nitroGLYCERIN  0.2 mg Transdermal Daily  . nortriptyline  10 mg Oral QHS  . piperacillin-tazobactam (ZOSYN)  IV  3.375 g Intravenous 3 times per day  . potassium chloride  20 mEq Oral BID  . protein supplement  1 scoop Oral TID WC  . tiotropium  18  mcg Inhalation Q breakfast  . vancomycin  1,000 mg Intravenous Q24H   Continuous Infusions: . sodium chloride 50 mL/hr at 11/11/13 1300   PRN Meds:acetaminophen, acetaminophen, albuterol, cadexomer iodine, fluticasone, guaifenesin, HYDROcodone-homatropine, ondansetron (ZOFRAN) IV, ondansetron, senna-docusate  Assessment/Plan:  HCAP: WBC still trending down.. BC"s neg. Tmax 98..5 On zosyn and vanc. Repeat CXR tomorrow AFIB: stable rate DM 2: BS's improved with BS 133  HTN: BP fair  COPD: continue Rx  HYPOTHYROID: on Rx  CAD: no cardiac CP , no signficant CHF, EF 60% last fall HYPOKALMEMIA: replaced, now 3.8 DM NEPHROPATHY: stable at 1.03 PROTEIN CALORIE  MALNUTRITION: alb only 2.6 FULL CODE    LOS: 2 days   Ronav Furney ALAN 11/12/2013, 8:15 AM

## 2013-11-12 NOTE — ED Provider Notes (Signed)
Medical screening examination/treatment/procedure(s) were conducted as a shared visit with non-physician practitioner(s) and myself.  I personally evaluated the patient during the encounter.   EKG Interpretation   Date/Time:  Monday November 10 2013 10:53:00 EDT Ventricular Rate:  126 PR Interval:    QRS Duration: 81 QT Interval:  314 QTC Calculation: 455 R Axis:   63 Text Interpretation:  Atrial fibrillation Ventricular premature complex  Low voltage, precordial leads RSR' in V1 or V2, probably normal variant  Repolarization abnormality, prob rate related Confirmed by Azjah Pardo  MD,  Jaymen Fetch ([id number]) on 11/10/2013 11:31:49 AM      Patient clinically has PNA. Tachycardia likely fever related, given fluids and antipyretics. D/w admitting physician Dr. Joylene Draft, would like admitted to stepdown as she is septic. At the time of admission she is full code.  Ephraim Hamburger, MD 11/12/13 [phone]

## 2013-11-13 ENCOUNTER — Inpatient Hospital Stay (HOSPITAL_COMMUNITY): Payer: Medicare Other

## 2013-11-13 LAB — GLUCOSE, CAPILLARY
GLUCOSE-CAPILLARY: 219 mg/dL — AB (ref 70–99)
GLUCOSE-CAPILLARY: 87 mg/dL (ref 70–99)
GLUCOSE-CAPILLARY: 232 mg/dL — AB (ref 70–99)
GLUCOSE-CAPILLARY: 118 mg/dL — AB (ref 70–99)
Glucose-Capillary: 65 mg/dL — ABNORMAL LOW (ref 70–99)
Glucose-Capillary: 145 mg/dL — ABNORMAL HIGH (ref 70–99)

## 2013-11-13 LAB — COMPREHENSIVE METABOLIC PANEL
ALBUMIN: 2.5 g/dL — AB (ref 3.5–5.2)
ALK PHOS: 61 U/L (ref 39–117)
ALT: 9 U/L (ref 0–35)
AST: 18 U/L (ref 0–37)
BUN: 17 mg/dL (ref 6–23)
CO2: 24 mEq/L (ref 19–32)
Calcium: 7.6 mg/dL — ABNORMAL LOW (ref 8.4–10.5)
Chloride: 107 mEq/L (ref 96–112)
Creatinine, Ser: 0.9 mg/dL (ref 0.50–1.10)
GFR calc non Af Amer: 55 mL/min — ABNORMAL LOW (ref >90)
GFR, EST AFRICAN AMERICAN: 63 mL/min — AB (ref >90)
GLUCOSE: 100 mg/dL — AB (ref 70–99)
POTASSIUM: 3.9 meq/L (ref 3.7–5.3)
Sodium: 145 mEq/L (ref 137–147)
Total Bilirubin: 0.5 mg/dL (ref 0.3–1.2)
Total Protein: 5.9 g/dL — ABNORMAL LOW (ref 6.0–8.3)

## 2013-11-13 LAB — CBC WITH DIFFERENTIAL/PLATELET
BASOS PCT: 0 % (ref 0–1)
Basophils Absolute: 0 10*3/uL (ref 0.0–0.1)
EOS PCT: 2 % (ref 0–5)
Eosinophils Absolute: 0.2 10*3/uL (ref 0.0–0.7)
HEMATOCRIT: 25.3 % — AB (ref 36.0–46.0)
HEMOGLOBIN: 8.2 g/dL — AB (ref 12.0–15.0)
Lymphocytes Relative: 20 % (ref 12–46)
Lymphs Abs: 1.8 10*3/uL (ref 0.7–4.0)
MCH: 30 pg (ref 26.0–34.0)
MCHC: 32.4 g/dL (ref 30.0–36.0)
MCV: 92.7 fL (ref 78.0–100.0)
MONO ABS: 0.6 10*3/uL (ref 0.1–1.0)
MONOS PCT: 6 % (ref 3–12)
Neutro Abs: 6.6 10*3/uL (ref 1.7–7.7)
Neutrophils Relative %: 72 % (ref 43–77)
Platelets: 216 10*3/uL (ref 150–400)
RBC: 2.73 MIL/uL — ABNORMAL LOW (ref 3.87–5.11)
RDW: 15.2 % (ref 11.5–15.5)
WBC: 9.2 10*3/uL (ref 4.0–10.5)

## 2013-11-13 LAB — PROTIME-INR
INR: 2.77 — ABNORMAL HIGH (ref 0.00–1.49)
Prothrombin Time: 28.3 seconds — ABNORMAL HIGH (ref 11.6–15.2)

## 2013-11-13 MED ORDER — WARFARIN SODIUM 2 MG PO TABS
2.0000 mg | ORAL_TABLET | Freq: Once | ORAL | Status: AC
  Administered 2013-11-13: 2 mg via ORAL
  Filled 2013-11-13: qty 1

## 2013-11-13 MED ORDER — INSULIN GLARGINE 100 UNIT/ML ~~LOC~~ SOLN
10.0000 [IU] | Freq: Every day | SUBCUTANEOUS | Status: DC
  Administered 2013-11-13 – 2013-11-17 (×5): 10 [IU] via SUBCUTANEOUS
  Filled 2013-11-13 (×7): qty 0.1

## 2013-11-13 NOTE — Progress Notes (Signed)
Daughter called regarding concerns of confusion. Advised was given hycodan for the cough and tylenol. Slight confusion noted. Aware that she is in the hospital...thought she was on the bedpan and it was verified that she was not. Will continue with monitoring.

## 2013-11-13 NOTE — Progress Notes (Addendum)
Subjective: Had a good night. Feeling a bit better. FBS low at 55, now 82. Less coughing. No CP.    Objective: Vital signs in last 24 hours: Temp:  [97.3 F (36.3 C)-98.7 F (37.1 C)] 97.3 F (36.3 C) (04/02 0742) Pulse Rate:  [73-97] 81 (04/02 0742) Resp:  [18-30] 18 (04/02 0742) BP: (114-157)/(51-74) 139/52 mmHg (04/02 0742) SpO2:  [93 %-100 %] 94 % (04/02 0803) Weight:  [88.1 kg (194 lb 3.6 oz)] 88.1 kg (194 lb 3.6 oz) (04/02 0353)  Intake/Output from previous day: 04/01 0701 - 04/02 0700 In: 1250 [I.V.:1200; IV Piggyback:50] Out: 350 [Urine:350] Intake/Output this shift:    Less toxic, sitting up in no distress. Lungs reveal some rhonchi, no accessory ms in use. Ht IR IR abd soft nondistended. Right foot unwrapped. Nickel sized eschar over back of heel. A bit soft under it. No odor or surrounding redness. Awake, alert, mentating well  Lab Results   Recent Labs  11/12/13 0230 11/13/13 0306  WBC 11.8* 9.2  RBC 2.81* 2.73*  HGB 8.4* 8.2*  HCT 25.8* 25.3*  MCV 91.8 92.7  MCH 29.9 30.0  RDW 15.1 15.2  PLT 203 216    Recent Labs  11/12/13 0230 11/13/13 0306  NA 141 145  K 3.8 3.9  CL 102 107  CO2 24 24  GLUCOSE 133* 100*  BUN 27* 17  CREATININE 1.03 0.90  CALCIUM 8.2* 7.6*    Studies/Results: Dg Chest 2 View  11/13/2013   CLINICAL DATA:  Pneumonia.  EXAM: CHEST  2 VIEW  COMPARISON:  DG CHEST 1V PORT dated 11/10/2013  FINDINGS: Mediastinal and hilar structures are normal. Persistent pulmonary infiltrate noted in the right upper and mid lung. Mild bibasilar atelectasis. No pleural effusion or pneumothorax. Heart size and pulmonary vascularity stable. No acute osseous abnormality.  IMPRESSION: Persistent right mid and upper lung pulmonary infiltrate.   Electronically Signed   By: Marcello Moores  Register   On: 11/13/2013 07:19    Scheduled Meds: . cholecalciferol  400 Units Oral Daily  . cyanocobalamin  500 mcg Oral Daily  . folic acid  1 mg Oral Daily  . gabapentin   300 mg Oral BID  . insulin aspart  0-5 Units Subcutaneous QHS  . insulin aspart  0-9 Units Subcutaneous TID WC  . insulin glargine  15 Units Subcutaneous QHS  . levothyroxine  50 mcg Oral Q breakfast  . nitroGLYCERIN  0.2 mg Transdermal Daily  . nortriptyline  10 mg Oral QHS  . piperacillin-tazobactam (ZOSYN)  IV  3.375 g Intravenous 3 times per day  . potassium chloride  20 mEq Oral BID  . protein supplement  1 scoop Oral TID WC  . tiotropium  18 mcg Inhalation Q breakfast  . vancomycin  1,000 mg Intravenous Q24H  . Warfarin - Pharmacist Dosing Inpatient   Does not apply q1800   Continuous Infusions: . sodium chloride 50 mL/hr at 11/12/13 0700   PRN Meds:acetaminophen, acetaminophen, albuterol, cadexomer iodine, fluticasone, guaifenesin, HYDROcodone-homatropine, ondansetron (ZOFRAN) IV, ondansetron, senna-docusate  Assessment/Plan: HEALTHCARE ACQUIRED PNEUMONIA: XR shows persistant infiltrates  WBC still trending down., now 9.2  BC"s neg. Tmax 98.7 On zosyn and vanc.   AFIB: stable rate , on coumadin DM 2: BS's too low at 55, reduce Rx HTN: BP fair  COPD: continue Rx  HYPOTHYROID: on Rx  CAD: no cardiac CP , no signficant CHF, EF 60% last fall  HYPOKALMEMIA: replaced, now 3.9 DM NEPHROPATHY: stable at 0.90 PROTEIN CALORIE MALNUTRITION: alb only  2.6  HEEL ULCER; seems stable. Eschar in place FULL CODE    LOS: 3 days   Sydney [name] ALAN 11/13/2013, 8:32 AM

## 2013-11-13 NOTE — Progress Notes (Addendum)
ANTICOAGULATION CONSULT NOTE - Follow Up Consult  Pharmacy Consult for coumadin; vancomycin and zosyn Indication: atrial fibrillation; PNA  Allergies  Allergen Reactions  . Other     Narcotics=makes her very disoriented  . Percocet [Oxycodone-Acetaminophen] Other (See Comments)    Dilerium, hallucinations  . Tramadol Anxiety  . Vicodin [Hydrocodone-Acetaminophen] Anxiety    Patient Measurements: Height: 5\' 6"  (167.6 cm) Weight: 194 lb 3.6 oz (88.1 kg) IBW/kg (Calculated) : 59.3   Vital Signs: Temp: 98.4 F (36.9 C) (04/02 1159) Temp src: Oral (04/02 1159) BP: 139/57 mmHg (04/02 1159) Pulse Rate: 83 (04/02 1159)  Labs:  Recent Labs  11/11/13 0255 11/12/13 0230 11/13/13 0306  HGB 8.9* 8.4* 8.2*  HCT 27.2* 25.8* 25.3*  PLT 230 203 216  LABPROT 31.0* 31.0* 28.3*  INR 3.12* 3.12* 2.77*  CREATININE 1.04 1.03 0.90    Estimated Creatinine Clearance: 46.4 ml/min (by C-G formula based on Cr of 0.9).  Assessment: Patient is a 80 y.o F on coumadin for Afib.  INR is finally down to within therapeutic range.  No bleeding documented. She's also on abx day #4 for HCAP.  All cultures have been negative. She remains afebrile , wbc wnl, and stable renal function.  3/30 zosyn>> 3/30 vanc>>  3/30 bcx x2>> ngtd MRSA pcr>> neg   Goal of Therapy:  INR 2-3; vancomycin trough level 15-20    Plan:  1) coumadin 2mg  PO x1 today 2) continue current vancomycin and zosyn regimens for now.  Pharmacy will plan on obtaining vancomycin trough level if to treat with vancomycin for >8 days  Lynelle Doctor 11/13/2013,1:23 PM

## 2013-11-14 DIAGNOSIS — J189 Pneumonia, unspecified organism: Secondary | ICD-10-CM | POA: Diagnosis not present

## 2013-11-14 DIAGNOSIS — R112 Nausea with vomiting, unspecified: Secondary | ICD-10-CM | POA: Diagnosis not present

## 2013-11-14 DIAGNOSIS — R05 Cough: Secondary | ICD-10-CM | POA: Diagnosis not present

## 2013-11-14 LAB — COMPREHENSIVE METABOLIC PANEL
ALBUMIN: 2.6 g/dL — AB (ref 3.5–5.2)
ALK PHOS: 59 U/L (ref 39–117)
ALT: 10 U/L (ref 0–35)
AST: 18 U/L (ref 0–37)
BUN: 14 mg/dL (ref 6–23)
CALCIUM: 7.7 mg/dL — AB (ref 8.4–10.5)
CO2: 20 mEq/L (ref 19–32)
Chloride: 104 mEq/L (ref 96–112)
Creatinine, Ser: 1.14 mg/dL — ABNORMAL HIGH (ref 0.50–1.10)
GFR calc non Af Amer: 41 mL/min — ABNORMAL LOW (ref >90)
GFR, EST AFRICAN AMERICAN: 48 mL/min — AB (ref >90)
GLUCOSE: 75 mg/dL (ref 70–99)
POTASSIUM: 4.1 meq/L (ref 3.7–5.3)
SODIUM: 139 meq/L (ref 137–147)
TOTAL PROTEIN: 6.5 g/dL (ref 6.0–8.3)
Total Bilirubin: 0.6 mg/dL (ref 0.3–1.2)

## 2013-11-14 LAB — GLUCOSE, CAPILLARY
GLUCOSE-CAPILLARY: 100 mg/dL — AB (ref 70–99)
GLUCOSE-CAPILLARY: 136 mg/dL — AB (ref 70–99)
GLUCOSE-CAPILLARY: 115 mg/dL — AB (ref 70–99)
GLUCOSE-CAPILLARY: 148 mg/dL — AB (ref 70–99)
Glucose-Capillary: 68 mg/dL — ABNORMAL LOW (ref 70–99)
Glucose-Capillary: 129 mg/dL — ABNORMAL HIGH (ref 70–99)

## 2013-11-14 LAB — PROTIME-INR
INR: 2.63 — ABNORMAL HIGH (ref 0.00–1.49)
Prothrombin Time: 27.2 seconds — ABNORMAL HIGH (ref 11.6–15.2)

## 2013-11-14 LAB — CBC WITH DIFFERENTIAL/PLATELET
Basophils Absolute: 0 10*3/uL (ref 0.0–0.1)
Basophils Relative: 0 % (ref 0–1)
EOS PCT: 2 % (ref 0–5)
Eosinophils Absolute: 0.2 10*3/uL (ref 0.0–0.7)
HEMATOCRIT: 26.4 % — AB (ref 36.0–46.0)
HEMOGLOBIN: 8.5 g/dL — AB (ref 12.0–15.0)
Lymphocytes Relative: 19 % (ref 12–46)
Lymphs Abs: 1.6 10*3/uL (ref 0.7–4.0)
MCH: 29.9 pg (ref 26.0–34.0)
MCHC: 32.2 g/dL (ref 30.0–36.0)
MCV: 93 fL (ref 78.0–100.0)
MONO ABS: 0.7 10*3/uL (ref 0.1–1.0)
MONOS PCT: 8 % (ref 3–12)
NEUTROS ABS: 6.2 10*3/uL (ref 1.7–7.7)
Neutrophils Relative %: 71 % (ref 43–77)
Platelets: 248 10*3/uL (ref 150–400)
RBC: 2.84 MIL/uL — ABNORMAL LOW (ref 3.87–5.11)
RDW: 15.3 % (ref 11.5–15.5)
WBC: 8.8 10*3/uL (ref 4.0–10.5)

## 2013-11-14 MED ORDER — WARFARIN SODIUM 2 MG PO TABS
2.0000 mg | ORAL_TABLET | Freq: Once | ORAL | Status: AC
  Administered 2013-11-14: 2 mg via ORAL
  Filled 2013-11-14: qty 1

## 2013-11-14 NOTE — Progress Notes (Signed)
Subjective: Patient is bright alert conversant appreciative that I am back from vacation. Still coughing but less short of breath. Fair and take.  Objective: Vital signs in last 24 hours: Temp:  [97.5 F (36.4 C)-98.5 F (36.9 C)] 98.1 F (36.7 C) (04/03 0752) Pulse Rate:  [73-99] 94 (04/03 0752) Resp:  [17-30] 30 (04/03 0752) BP: (126-160)/(47-78) 142/60 mmHg (04/03 0752) SpO2:  [90 %-99 %] 99 % (04/03 0752) Weight:  [88.1 kg (194 lb 3.6 oz)] 88.1 kg (194 lb 3.6 oz) (04/03 0300) Weight change: 0 kg (0 lb)  CBG (last 3)   Recent Labs  11/14/13 0456 11/14/13 0528 11/14/13 0749  GLUCAP 68* 100* 136*    Intake/Output from previous day: 04/02 0701 - 04/03 0700 In: 1150 [P.O.:700; I.V.:450] Out: 200 [Urine:200]  Physical Exam: Patient is bright interactive No JVD or bruits Rhonchi bilaterally respiratory rate 18 no distress Cardiovascular irregular no obvious murmur rate in the 80s Abdomen soft and nontender Left stump intact Right foot dressed with splint in place Neurologic exam is normal   Lab Results:  Recent Labs  11/13/13 0306 11/14/13 0240  NA 145 139  K 3.9 4.1  CL 107 104  CO2 24 20  GLUCOSE 100* 75  BUN 17 14  CREATININE 0.90 1.14*  CALCIUM 7.6* 7.7*    Recent Labs  11/13/13 0306 11/14/13 0240  AST 18 18  ALT 9 10  ALKPHOS 61 59  BILITOT 0.5 0.6  PROT 5.9* 6.5  ALBUMIN 2.5* 2.6*    Recent Labs  11/13/13 0306 11/14/13 0240  WBC 9.2 8.8  NEUTROABS 6.6 6.2  HGB 8.2* 8.5*  HCT 25.3* 26.4*  MCV 92.7 93.0  PLT 216 248   Lab Results  Component Value Date   INR 2.63* 11/14/2013   INR 2.77* 11/13/2013   INR 3.12* 11/12/2013   No results found for this basename: CKTOTAL, CKMB, CKMBINDEX, TROPONINI,  in the last 72 hours No results found for this basename: TSH, T4TOTAL, FREET3, T3FREE, THYROIDAB,  in the last 72 hours No results found for this basename: VITAMINB12, FOLATE, FERRITIN, TIBC, IRON, RETICCTPCT,  in the last 72  hours  Studies/Results: Dg Chest 2 View  11/13/2013   CLINICAL DATA:  Pneumonia.  EXAM: CHEST  2 VIEW  COMPARISON:  DG CHEST 1V PORT dated 11/10/2013  FINDINGS: Mediastinal and hilar structures are normal. Persistent pulmonary infiltrate noted in the right upper and mid lung. Mild bibasilar atelectasis. No pleural effusion or pneumothorax. Heart size and pulmonary vascularity stable. No acute osseous abnormality.  IMPRESSION: Persistent right mid and upper lung pulmonary infiltrate.   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 stage III at baseline  #5 coronary artery disease stable  #6 PVD status post left amputation stable  #7 peripheral neuropathy with right foot ulceration stable  #8 protein calorie malnutrition stable   LOS: 4 days   Emberlie Gotcher A 11/14/2013, 9:33 AM

## 2013-11-14 NOTE — Progress Notes (Addendum)
Clinical Social Work Department BRIEF PSYCHOSOCIAL ASSESSMENT 11/14/2013  Patient:  Sydney [name], Sydney [name]     Account Number:  [id number]     Admit date:  11/10/2013  Clinical Social Worker:  Freeman Caldron  Date/Time:  11/14/2013 03:07 PM  Referred by:  Physician  Date Referred:  11/14/2013 Referred for  SNF Placement ALF Placement   Other Referral:   Interview type:  Patient Other interview type:    PSYCHOSOCIAL DATA Living Status:  FACILITY Admitted from facility:  [facility] [place] Level of care:  Assisted Living Primary support name:  [name] [phone]) Primary support relationship to patient:  CHILD, ADULT Degree of support available:   Good--pt states she has support from daughter and son-in-law, and lives in assisted living at [facility].    CURRENT CONCERNS Current Concerns  Post-Acute Placement   Other Concerns:    SOCIAL WORK ASSESSMENT / PLAN Spoke with pt, who states she has been at [facility] since January and likes it there. Pt would like to return. Pt states she was at the assisted living level of care. CSW explained that PT/OT may recommend rehab, and if this is the case [facility] can provide this. Pt states she is not ready for rehab. Provided support to pt and explained I will continue to assist with care plan and will coordinate this with [facility].   Assessment/plan status:  Psychosocial Support/Ongoing Assessment of Needs Other assessment/ plan:   Information/referral to community resources:   ?ALF ?SNF    PATIENT'S/FAMILY'S RESPONSE TO PLAN OF CARE: Good--pt engaged in conversation with CSW. Pt states she wants to return to [facility].       Ky Barban, MSW, [facility] Clinical Social Worker [phone]

## 2013-11-14 NOTE — Progress Notes (Signed)
ANTICOAGULATION CONSULT NOTE - Follow Up Consult  Pharmacy Consult for coumadin Indication: atrial fibrillation  Allergies  Allergen Reactions  . Other     Narcotics=makes her very disoriented  . Percocet [Oxycodone-Acetaminophen] Other (See Comments)    Dilerium, hallucinations  . Tramadol Anxiety  . Vicodin [Hydrocodone-Acetaminophen] Anxiety    Patient Measurements: Height: 5\' 6"  (167.6 cm) Weight: 194 lb 3.6 oz (88.1 kg) IBW/kg (Calculated) : 59.3   Vital Signs: Temp: 97.6 F (36.4 C) (04/03 1159) Temp src: Oral (04/03 1159) BP: 154/84 mmHg (04/03 1159) Pulse Rate: 87 (04/03 1159)  Labs:  Recent Labs  11/12/13 0230 11/13/13 0306 11/14/13 0240  HGB 8.4* 8.2* 8.5*  HCT 25.8* 25.3* 26.4*  PLT 203 216 248  LABPROT 31.0* 28.3* 27.2*  INR 3.12* 2.77* 2.63*  CREATININE 1.03 0.90 1.14*    Estimated Creatinine Clearance: 36.7 ml/min (by C-G formula based on Cr of 1.14).   Assessment: Patient is a 80 y.o F on coumadin for afib.  INR is therapeutic at 2.63 today.  No bleeding documented.   Goal of Therapy:  INR 2-3    Plan:  1) coumadin 2mg  PO x1 today  Arel Tippen P 11/14/2013,2:01 PM

## 2013-11-14 NOTE — Progress Notes (Signed)
Dressing changed to rt foot. Necrotic dime sized area to rt. heel

## 2013-11-14 NOTE — Evaluation (Signed)
Occupational Therapy Evaluation Patient Details Name: ALEK PONCEDELEON MRN: [id number] DOB: 1921/01/06 Today's Date: 11/14/2013    History of Present Illness Shataria is a pleasant 80 yo female. 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  She has lived at [place] for the last 2 months and she likes it there.  She reports two weeks of cough and could symptoms. The cough has not improved at all. It is deep but not productive.  Today she develop nausea and vomiting this a.m.Marland Kitchen  Then she showed significant cyanosis.  She is brought to the ER and found to be hypoxic and febrile.  She has a wound to her right heel that is being dressed daily.    Clinical Impression   This 80 yo female admitted with above presents to acute OT with generalized weakness, NWB'ing RLE, BKA RLE without prothesis here--all affecting pt's ability to take care of herself more independently. Pt will benefit from acute OT with follow up [place] at ALF (she does not prefer the SNF at [facility] and the ALF of [facility] has been meeting her needs).     Follow Up Recommendations  Home health OT (at ALF)    Equipment Recommendations  None recommended by OT       Precautions / Restrictions Precautions Precautions: Fall Precaution Comments: RLE soft brace at all times; had LLE prothesis (but not here and only uses it to help propel herself in W/C (has only had 2 sessions with OPPT) Restrictions Weight Bearing Restrictions: Yes RLE Weight Bearing: Non weight bearing (per daughter in room she has been using a sliding board at ALF)      Mobility Bed Mobility Overal bed mobility: Needs Assistance Bed Mobility: Rolling;Supine to Sit Rolling: Supervision   Supine to sit: Mod assist (to come up to sit crossways on the bed to prepare for posterior transfers.)        Transfers Overall transfer level: Needs assistance   Transfers: Anterior-Posterior Transfer       Anterior-Posterior transfers: +2 physical assistance;Total  assist        Balance Overall balance assessment: Needs assistance Sitting-balance support: Feet unsupported;Bilateral upper extremity supported Sitting balance-Leahy Scale: Poor                                      ADL Overall ADL's : Needs assistance/impaired Eating/Feeding: Independent;Bed level   Grooming: Set up;Bed level   Upper Body Bathing: Set up;Bed level   Lower Body Bathing: Moderate assistance;Bed level;Sitting/lateral leans (rolling right<>left)   Upper Body Dressing : Set up;Bed level   Lower Body Dressing: Maximal assistance;Sitting/lateral leans;Bed level (rolling right<>left)   Toilet Transfer: Total assistance;+2 for physical assistance;Anterior/posterior (recliner built up with blankets to get close to same height as bed)   Toileting- Clothing Manipulation and Hygiene: Total assistance                               Hand Dominance Right   Extremity/Trunk Assessment Upper Extremity Assessment Upper Extremity Assessment: Overall WFL for tasks assessed (except not for anterior/posterior transfers)           Communication Communication Communication: No difficulties   Cognition Arousal/Alertness: Awake/alert Behavior During Therapy: WFL for tasks assessed/performed Overall Cognitive Status: Within Functional Limits for tasks assessed  Home Living Family/patient expects to be discharged to:: Assisted living                             Home Equipment: Wheelchair - manual;Walker - 2 wheels   Additional Comments: Sliding board transfers only since developed RLE heel wound      Prior Functioning/Environment Level of Independence: Needs assistance  Gait / Transfers Assistance Needed: used a sliding board ADL's / Homemaking Assistance Needed: bed level ADL        OT Diagnosis: Generalized weakness   OT Problem List: Decreased strength;Impaired balance (sitting  and/or standing);Decreased knowledge of use of DME or AE   OT Treatment/Interventions: Self-care/ADL training;Therapeutic activities;Therapeutic exercise;Patient/family education;DME and/or AE instruction;Balance training    OT Goals(Current goals can be found in he care plan section) Acute Rehab OT Goals Patient Stated Goal: back to ALF then OP to work with prothesis OT Goal Formulation: With patient Time For Goal Achievement: 11/28/13 Potential to Achieve Goals: Good  OT Frequency: Min 2X/week              End of Session Nurse Communication:  (Pt back to bed by anterior transfer with pad in recliner)  Activity Tolerance: Patient tolerated treatment well Patient left: in chair;with call bell/phone within reach;with nursing/sitter in room   Time: 5681-2751 OT Time Calculation (min): 23 min Charges:  OT General Charges $OT Visit: 1 Procedure OT Evaluation $Initial OT Evaluation Tier I: 1 Procedure OT Treatments $Therapeutic Activity: 8-22 mins  Almon Register [phone] 11/14/2013, 3:22 PM

## 2013-11-14 NOTE — Progress Notes (Signed)
Pt's blood sugar was checked at 5 am. CBG was 68. Pt received some sprite, graham crackers and some ensure. Blood sugar was rechecked 30 min later and it was 100. Continue to monitor the pt.

## 2013-11-15 DIAGNOSIS — R05 Cough: Secondary | ICD-10-CM | POA: Diagnosis not present

## 2013-11-15 DIAGNOSIS — J189 Pneumonia, unspecified organism: Secondary | ICD-10-CM | POA: Diagnosis not present

## 2013-11-15 DIAGNOSIS — R112 Nausea with vomiting, unspecified: Secondary | ICD-10-CM | POA: Diagnosis not present

## 2013-11-15 LAB — COMPREHENSIVE METABOLIC PANEL
ALK PHOS: 64 U/L (ref 39–117)
ALT: 8 U/L (ref 0–35)
AST: 17 U/L (ref 0–37)
Albumin: 2.7 g/dL — ABNORMAL LOW (ref 3.5–5.2)
BUN: 14 mg/dL (ref 6–23)
CO2: 21 mEq/L (ref 19–32)
Calcium: 8.3 mg/dL — ABNORMAL LOW (ref 8.4–10.5)
Chloride: 102 mEq/L (ref 96–112)
Creatinine, Ser: 1.36 mg/dL — ABNORMAL HIGH (ref 0.50–1.10)
GFR, EST AFRICAN AMERICAN: 38 mL/min — AB (ref >90)
GFR, EST NON AFRICAN AMERICAN: 33 mL/min — AB (ref >90)
Glucose, Bld: 124 mg/dL — ABNORMAL HIGH (ref 70–99)
POTASSIUM: 4.4 meq/L (ref 3.7–5.3)
Sodium: 140 mEq/L (ref 137–147)
TOTAL PROTEIN: 6.7 g/dL (ref 6.0–8.3)
Total Bilirubin: 0.9 mg/dL (ref 0.3–1.2)

## 2013-11-15 LAB — CBC WITH DIFFERENTIAL/PLATELET
BASOS ABS: 0 10*3/uL (ref 0.0–0.1)
Basophils Relative: 0 % (ref 0–1)
Eosinophils Absolute: 0.2 10*3/uL (ref 0.0–0.7)
Eosinophils Relative: 2 % (ref 0–5)
HEMATOCRIT: 28.6 % — AB (ref 36.0–46.0)
Hemoglobin: 9.3 g/dL — ABNORMAL LOW (ref 12.0–15.0)
LYMPHS PCT: 10 % — AB (ref 12–46)
Lymphs Abs: 1 10*3/uL (ref 0.7–4.0)
MCH: 30 pg (ref 26.0–34.0)
MCHC: 32.5 g/dL (ref 30.0–36.0)
MCV: 92.3 fL (ref 78.0–100.0)
Monocytes Absolute: 0.9 10*3/uL (ref 0.1–1.0)
Monocytes Relative: 9 % (ref 3–12)
NEUTROS ABS: 8 10*3/uL — AB (ref 1.7–7.7)
NEUTROS PCT: 79 % — AB (ref 43–77)
Platelets: 274 10*3/uL (ref 150–400)
RBC: 3.1 MIL/uL — ABNORMAL LOW (ref 3.87–5.11)
RDW: 15 % (ref 11.5–15.5)
WBC: 10.1 10*3/uL (ref 4.0–10.5)

## 2013-11-15 LAB — GLUCOSE, CAPILLARY
Glucose-Capillary: 123 mg/dL — ABNORMAL HIGH (ref 70–99)
Glucose-Capillary: 259 mg/dL — ABNORMAL HIGH (ref 70–99)
Glucose-Capillary: 232 mg/dL — ABNORMAL HIGH (ref 70–99)
Glucose-Capillary: 96 mg/dL (ref 70–99)

## 2013-11-15 LAB — PROTIME-INR
INR: 2.37 — AB (ref 0.00–1.49)
PROTHROMBIN TIME: 25.1 s — AB (ref 11.6–15.2)

## 2013-11-15 MED ORDER — WARFARIN SODIUM 2.5 MG PO TABS
2.5000 mg | ORAL_TABLET | Freq: Once | ORAL | Status: AC
  Administered 2013-11-15: 2.5 mg via ORAL
  Filled 2013-11-15: qty 1

## 2013-11-15 NOTE — Evaluation (Signed)
Physical Therapy Evaluation Patient Details Name: Sydney [name] MRN: [id number] DOB: 1921-05-16 Today's Date: 11/15/2013   History of Present Illness  Sydney [name] is a pleasant 80 yo female. 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  She has lived at [place] for the last 2 months and she likes it there.  She reports two weeks of cough and could symptoms. The cough has not improved at all. It is deep but not productive.  Today she develop nausea and vomiting this a.m.Marland Kitchen  Then she showed significant cyanosis.  She is brought to the ER and found to be hypoxic and febrile.  She has a wound to her right heel that is being dressed daily.   Clinical Impression  Pt admitted with above. Pt currently with functional limitations due to the deficits listed below (see PT Problem List). Pt to call daughter and ensure that prosthesis is brought to hospital by Monday so PT can work on [employer] transfers.  Pt will benefit from skilled PT to increase their independence and safety with mobility to allow discharge to the venue listed below.     Follow Up Recommendations Supervision/Assistance - 24 hour;Outpatient PT    Equipment Recommendations  None recommended by PT    Recommendations for Other Services       Precautions / Restrictions Precautions Precautions: Fall Precaution Comments: RLE soft brace at all times; had LLE prothesis (but not here and only uses it to help propel herself in W/C (has only had 2 sessions with OPPT) Restrictions Weight Bearing Restrictions: Yes RLE Weight Bearing: Non weight bearing      Mobility  Bed Mobility Overal bed mobility: Needs Assistance Bed Mobility: Supine to Sit Rolling: Min guard   Supine to sit: Mod assist;+2 for physical assistance     General bed mobility comments: For LES and elevation of trunk to get to EOB.  Used pad as well.   Transfers                    Ambulation/Gait                Stairs            Wheelchair  Mobility    Modified Rankin (Stroke Patients Only)       Balance Overall balance assessment: Needs assistance;History of Falls Sitting-balance support: Bilateral upper extremity supported;Feet supported Sitting balance-Leahy Scale: Poor Sitting balance - Comments: Slight posterior lean but was able to get balance and sit without UE support Postural control: Posterior lean                                   Pertinent Vitals/Pain VSS, no pain    Home Living Family/patient expects to be discharged to:: Assisted living               Home Equipment: Wheelchair - [employer] - 2 wheels Additional Comments: Sliding board transfers only since developed RLE heel wound    Prior Function Level of Independence: Needs assistance   Gait / Transfers Assistance Needed: used a sliding board  ADL's / Homemaking Assistance Needed: bed level ADL  Comments: Mostly has pivotted for transfers since the BKA     Hand Dominance   Dominant Hand: Right    Extremity/Trunk Assessment   Upper Extremity Assessment: Defer to OT evaluation           Lower Extremity Assessment:  RLE deficits/detail;LLE deficits/detail RLE Deficits / Details: knee and hip joint grossly 3/5 LLE Deficits / Details: knee and hip WFL  Cervical / Trunk Assessment: Kyphotic  Communication   Communication: No difficulties  Cognition Arousal/Alertness: Awake/alert Behavior During Therapy: WFL for tasks assessed/performed Overall Cognitive Status: Within Functional Limits for tasks assessed                      General Comments      Exercises        Assessment/Plan    PT Assessment Patient needs continued PT services  PT Diagnosis Generalized weakness   PT Problem List Decreased activity tolerance;Decreased strength;Decreased balance;Decreased mobility;Decreased knowledge of use of DME;Decreased safety awareness;Decreased knowledge of precautions  PT Treatment Interventions DME  instruction;Gait training;Functional mobility training;Therapeutic activities;Therapeutic exercise;Balance training;Patient/family education   PT Goals (Current goals can be found in the Care Plan section) Acute Rehab PT Goals Patient Stated Goal: back to ALF then OP to work with prothesis PT Goal Formulation: With patient Time For Goal Achievement: 11/22/13 Potential to Achieve Goals: Good    Frequency Min 3X/week   Barriers to discharge        Co-evaluation               End of Session Equipment Utilized During Treatment: Gait belt Activity Tolerance: Other (comment) (could not get pt up to chair bc unit had no lift pads) Patient left: in bed;with call bell/phone within reach;with bed alarm set Nurse Communication: Mobility status;Need for lift equipment         Time: 0354-6568 PT Time Calculation (min): 20 min   Charges:   PT Evaluation $Initial PT Evaluation Tier I: 1 Procedure PT Treatments $Therapeutic Activity: 8-22 mins   PT G Codes:          Sydney [name],Sydney [name] 2013/12/08, 3:54 PM [facility] Acute Rehabilitation [phone] (pager)

## 2013-11-15 NOTE — Progress Notes (Signed)
Pt's sbp has been high in the 180s and 170s, pt does not have any bp prn med. Dr Reynaldo Minium made aware who said not to call him about "bp readings like this", when told that the pt was also worried about her high bp he said to tell the pt not to worry about it. Will continue to monitor pt.-------Erla Bacchi, rn

## 2013-11-15 NOTE — Progress Notes (Signed)
ANTICOAGULATION CONSULT NOTE - Follow Up Consult  Pharmacy Consult for coumadin Indication: atrial fibrillation  Allergies  Allergen Reactions  . Other     Narcotics=makes her very disoriented  . Percocet [Oxycodone-Acetaminophen] Other (See Comments)    Dilerium, hallucinations  . Tramadol Anxiety  . Vicodin [Hydrocodone-Acetaminophen] Anxiety    Patient Measurements: Height: 5\' 6"  (167.6 cm) Weight: 162 lb 7.7 oz (73.7 kg) IBW/kg (Calculated) : 59.3   Vital Signs: Temp: 98.8 F (37.1 C) (04/04 0800) Temp src: Oral (04/04 0800) BP: 179/62 mmHg (04/04 0802) Pulse Rate: 109 (04/04 0802)  Labs:  Recent Labs  11/13/13 0306 11/14/13 0240 11/15/13 0332  HGB 8.2* 8.5* 9.3*  HCT 25.3* 26.4* 28.6*  PLT 216 248 274  LABPROT 28.3* 27.2* 25.1*  INR 2.77* 2.63* 2.37*  CREATININE 0.90 1.14* 1.36*    Estimated Creatinine Clearance: 28.3 ml/min (by C-G formula based on Cr of 1.36).   Assessment: Patient is a 80 y.o. female presented 3/30 from [facility] with SOB, AMS. On coumadin PTA for afib, IN r3.14 on admit. Home dose = 4mg  daily except 2mg  on MWF (verified with [facility]). INR today 2.37, trending down   CBC stable, no bleeding noted.   Goal of Therapy:  INR 2-3   Plan:  Coumadin 2.5 mg x 1 PO tonight Daily INR, monitor for s/s bleeding.   Oval Linsey. Leitha Schuller, PharmD Clinical Pharmacist - Resident Phone: [phone] Pager: [phone] 11/15/2013 8:18 AM

## 2013-11-15 NOTE — Progress Notes (Signed)
Subjective: Seems to be in good spirits still with a raspy cough and some breathlessness but eating better.  Objective: Vital signs in last 24 hours: Temp:  [97.6 F (36.4 C)-98.9 F (37.2 C)] 98.8 F (37.1 C) (04/04 0800) Pulse Rate:  [87-109] 109 (04/04 0802) Resp:  [17-36] 27 (04/04 0802) BP: (147-184)/(62-91) 179/62 mmHg (04/04 0802) SpO2:  [93 %-100 %] 96 % (04/04 0802) Weight:  [73.7 kg (162 lb 7.7 oz)] 73.7 kg (162 lb 7.7 oz) (04/04 0432) Weight change: -14.4 kg (-31 lb 11.9 oz)  CBG (last 3)   Recent Labs  11/14/13 1743 11/14/13 2146 11/15/13 0802  GLUCAP 148* 129* 123*    Intake/Output from previous day: 04/03 0701 - 04/04 0700 In: 600 [P.O.:600] Out: 0   Physical Exam:   Patient is bright interactive  No JVD or bruits  Rhonchi bilaterally respiratory rate 18 no distress  Cardiovascular irregular no obvious murmur rate in the 80s  Abdomen soft and nontender  Left stump intact  Right foot dressed with splint in place  Neurologic exam is normal  Lab Results:  Recent Labs  11/14/13 0240 11/15/13 0332  NA 139 140  K 4.1 4.4  CL 104 102  CO2 20 21  GLUCOSE 75 124*  BUN 14 14  CREATININE 1.14* 1.36*  CALCIUM 7.7* 8.3*    Recent Labs  11/14/13 0240 11/15/13 0332  AST 18 17  ALT 10 8  ALKPHOS 59 64  BILITOT 0.6 0.9  PROT 6.5 6.7  ALBUMIN 2.6* 2.7*    Recent Labs  11/14/13 0240 11/15/13 0332  WBC 8.8 10.1  NEUTROABS 6.2 8.0*  HGB 8.5* 9.3*  HCT 26.4* 28.6*  MCV 93.0 92.3  PLT 248 274   Lab Results  Component Value Date   INR 2.37* 11/15/2013   INR 2.63* 11/14/2013   INR 2.77* 11/13/2013   No results found for this basename: CKTOTAL, CKMB, CKMBINDEX, TROPONINI,  in the last 72 hours No results found for this basename: TSH, T4TOTAL, FREET3, T3FREE, THYROIDAB,  in the last 72 hours No results found for this basename: VITAMINB12, FOLATE, FERRITIN, TIBC, IRON, RETICCTPCT,  in the last 72 hours  Studies/Results: No results  found.   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 stage III at baseline  #5 coronary artery disease stable  #6 PVD status post left amputation stable  #7 peripheral neuropathy with right foot ulceration stable  #8 protein calorie malnutrition stable  Overall stable enough to move to the floor at this time. Given the continued need for eye IV antibiotics, right foot wound care and probable need for rehabilitation transition to LTAC seems appropriate.  LOS: 5 days   Sydney [name] A 11/15/2013, 9:05 AM

## 2013-11-15 NOTE — Progress Notes (Signed)
Received to 3S28. Family with patient. Instructed on use of call light/phone,etc. No questions/complaints.Tele applied. CM called to verify.

## 2013-11-16 LAB — CBC WITH DIFFERENTIAL/PLATELET
BASOS PCT: 1 % (ref 0–1)
Basophils Absolute: 0 10*3/uL (ref 0.0–0.1)
EOS ABS: 0.2 10*3/uL (ref 0.0–0.7)
Eosinophils Relative: 3 % (ref 0–5)
HCT: 27.7 % — ABNORMAL LOW (ref 36.0–46.0)
Hemoglobin: 9.1 g/dL — ABNORMAL LOW (ref 12.0–15.0)
Lymphocytes Relative: 13 % (ref 12–46)
Lymphs Abs: 0.9 10*3/uL (ref 0.7–4.0)
MCH: 30 pg (ref 26.0–34.0)
MCHC: 32.9 g/dL (ref 30.0–36.0)
MCV: 91.4 fL (ref 78.0–100.0)
MONO ABS: 0.4 10*3/uL (ref 0.1–1.0)
Monocytes Relative: 7 % (ref 3–12)
NEUTROS ABS: 5 10*3/uL (ref 1.7–7.7)
NEUTROS PCT: 76 % (ref 43–77)
Platelets: 262 10*3/uL (ref 150–400)
RBC: 3.03 MIL/uL — ABNORMAL LOW (ref 3.87–5.11)
RDW: 15 % (ref 11.5–15.5)
WBC: 6.5 10*3/uL (ref 4.0–10.5)

## 2013-11-16 LAB — CULTURE, BLOOD (ROUTINE X 2)
CULTURE: NO GROWTH
Culture: NO GROWTH

## 2013-11-16 LAB — COMPREHENSIVE METABOLIC PANEL
ALK PHOS: 59 U/L (ref 39–117)
ALT: 8 U/L (ref 0–35)
AST: 14 U/L (ref 0–37)
Albumin: 2.5 g/dL — ABNORMAL LOW (ref 3.5–5.2)
BILIRUBIN TOTAL: 0.6 mg/dL (ref 0.3–1.2)
BUN: 16 mg/dL (ref 6–23)
CHLORIDE: 104 meq/L (ref 96–112)
CO2: 18 meq/L — AB (ref 19–32)
Calcium: 8.2 mg/dL — ABNORMAL LOW (ref 8.4–10.5)
Creatinine, Ser: 1.41 mg/dL — ABNORMAL HIGH (ref 0.50–1.10)
GFR, EST AFRICAN AMERICAN: 37 mL/min — AB (ref >90)
GFR, EST NON AFRICAN AMERICAN: 32 mL/min — AB (ref >90)
GLUCOSE: 140 mg/dL — AB (ref 70–99)
POTASSIUM: 4.6 meq/L (ref 3.7–5.3)
SODIUM: 138 meq/L (ref 137–147)
Total Protein: 6.6 g/dL (ref 6.0–8.3)

## 2013-11-16 LAB — PROTIME-INR
INR: 2.67 — AB (ref 0.00–1.49)
Prothrombin Time: 27.5 seconds — ABNORMAL HIGH (ref 11.6–15.2)

## 2013-11-16 LAB — GLUCOSE, CAPILLARY
Glucose-Capillary: 138 mg/dL — ABNORMAL HIGH (ref 70–99)
Glucose-Capillary: 212 mg/dL — ABNORMAL HIGH (ref 70–99)
Glucose-Capillary: 183 mg/dL — ABNORMAL HIGH (ref 70–99)

## 2013-11-16 MED ORDER — WARFARIN SODIUM 2 MG PO TABS
2.0000 mg | ORAL_TABLET | Freq: Once | ORAL | Status: AC
  Administered 2013-11-16: 2 mg via ORAL
  Filled 2013-11-16: qty 1

## 2013-11-16 MED ORDER — VANCOMYCIN HCL IN DEXTROSE 750-5 MG/150ML-% IV SOLN
750.0000 mg | INTRAVENOUS | Status: DC
  Administered 2013-11-16: 750 mg via INTRAVENOUS
  Filled 2013-11-16 (×2): qty 150

## 2013-11-16 NOTE — Progress Notes (Signed)
   CARE MANAGEMENT NOTE 11/16/2013  Patient:  Sydney [name], Sydney [name]   Account Number:  [id number]  Date Initiated:  11/11/2013  Documentation initiated by:  MAYO,HENRIETTA  Subjective/Objective Assessment:   dx PNA/sepsis; resident of [facility]    PCP Dr Burnard Bunting     Action/Plan:   Anticipated DC Date:     Anticipated DC Plan:    In-house referral  Clinical Social Worker      DC Planning Services  CM consult      Choice offered to / List presented to:             Status of service:  In process, will continue to follow Medicare Important Message given?   (If response is "NO", the following Medicare IM given date fields will be blank) Date Medicare IM given:   Date Additional Medicare IM given:    Discharge Disposition:    Per UR Regulation:  Reviewed for med. necessity/level of care/duration of stay  If discussed at [place] of Stay Meetings, dates discussed:    Comments:  11/16/13 09:15 CM met with pt in room to discuss LTAC and the benefits of immediate medical intervention vs [facility] SNF. Pt politely but adamantly refuses stating she is very happy at [facility] and plans on being discharge back to [facility].  CM encouraged her to discuss with her MD.  No other CM needs were discussed at this time.  Will continue to monitor for disposition.  Mariane Masters, BSN, CM [phone].

## 2013-11-16 NOTE — Clinical Social Work Note (Signed)
CSW informed of patient's decision to go to [facility] instead of LTAC. CSW met with patient who was alert and oriented x4. CSW introduced self and explained role. CSW further discussed LTAC and its benefits vs [facility]. Patient stated she wants to go back to [facility] because she feels she can get the same care there as an LTAC. CSW informed patient she would be going to [facility] SNF and not ALF. Patient reported she did not care as long as she could return to [facility]. Patient stated she has had 3 MD's tell her that she made a good choice with [facility] and she agrees. Patient stated, "it's clean there, the care is good, and they're attentive to my needs." CSW verbalized understanding of the above and will follow-up with referral to [facility] SN[facility].   [place], [place] Weekend Clinical Social Worker [phone]

## 2013-11-16 NOTE — Progress Notes (Signed)
Subjective:  patient looks remarkably well still moist cough but bright conversant no difficulty speaking  Objective: Vital signs in last 24 hours: Temp:  [97.3 F (36.3 C)-98.3 F (36.8 C)] 98.3 F (36.8 C) (04/05 0900) Pulse Rate:  [89-100] 97 (04/05 0900) Resp:  [20-23] 21 (04/05 0900) BP: (154-171)/(78-89) 160/86 mmHg (04/05 0900) SpO2:  [95 %-99 %] 95 % (04/05 1200) Weight:  [73.5 kg (162 lb 0.6 oz)] 73.5 kg (162 lb 0.6 oz) (04/04 2045) Weight change: -0.2 kg (-7.1 oz)  CBG (last 3)   Recent Labs  11/15/13 2048 11/16/13 0751 11/16/13 1134  GLUCAP 96 138* 212*    Intake/Output from previous day: 04/04 0701 - 04/05 0700 In: 2714 [P.O.:360; I.V.:754; IV Piggyback:1600] Out: -   Physical Exam:  Patient is bright interactive  No JVD or bruits  Rhonchi bilaterally respiratory rate 18 no distress  Cardiovascular irregular no obvious murmur rate in the 80s  Abdomen soft and nontender  Left stump intact  Right foot dressed with splint in place  Neurologic exam is normal   Lab Results:  Recent Labs  11/15/13 0332 11/16/13 0635  NA 140 138  K 4.4 4.6  CL 102 104  CO2 21 18*  GLUCOSE 124* 140*  BUN 14 16  CREATININE 1.36* 1.41*  CALCIUM 8.3* 8.2*    Recent Labs  11/15/13 0332 11/16/13 0635  AST 17 14  ALT 8 8  ALKPHOS 64 59  BILITOT 0.9 0.6  PROT 6.7 6.6  ALBUMIN 2.7* 2.5*    Recent Labs  11/15/13 0332 11/16/13 0635  WBC 10.1 6.5  NEUTROABS 8.0* 5.0  HGB 9.3* 9.1*  HCT 28.6* 27.7*  MCV 92.3 91.4  PLT 274 262   Lab Results  Component Value Date   INR 2.67* 11/16/2013   INR 2.37* 11/15/2013   INR 2.63* 11/14/2013   No results found for this basename: CKTOTAL, CKMB, CKMBINDEX, TROPONINI,  in the last 72 hours No results found for this basename: TSH, T4TOTAL, FREET3, T3FREE, THYROIDAB,  in the last 72 hours No results found for this basename: VITAMINB12, FOLATE, FERRITIN, TIBC, IRON, RETICCTPCT,  in the last 72 hours  Studies/Results: No  results found.   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 stage III at baseline  #5 coronary artery disease stable  #6 PVD status post left amputation stable  #7 peripheral neuropathy with right foot ulceration stable  #8 protein calorie malnutrition stable  Patient overall improving nicely informed that she is not going to be able to return to [facility] but certainly [facility] if they offered that is reasonable we are targeting the next day or 2   LOS: 6 days   Keinan Brouillet A 11/16/2013, 1:03 PM

## 2013-11-16 NOTE — Progress Notes (Signed)
ANTICOAGULATION/ANTIBIOTIC CONSULT NOTE - Follow Up Consult  Pharmacy Consult for coumadin / vancomycin + zosyn Indication: atrial fibrillation / pneumonia  Allergies  Allergen Reactions  . Other     Narcotics=makes her very disoriented  . Percocet [Oxycodone-Acetaminophen] Other (See Comments)    Dilerium, hallucinations  . Tramadol Anxiety  . Vicodin [Hydrocodone-Acetaminophen] Anxiety    Patient Measurements: Height: 5\' 6"  (167.6 cm) Weight: 162 lb 0.6 oz (73.5 kg) IBW/kg (Calculated) : 59.3   Vital Signs: Temp: 97.3 F (36.3 C) (04/05 0600) Temp src: Oral (04/05 0600) BP: 154/84 mmHg (04/05 0600) Pulse Rate: 89 (04/05 0600)  Labs:  Recent Labs  11/14/13 0240 11/15/13 0332 11/16/13 0635  HGB 8.5* 9.3* 9.1*  HCT 26.4* 28.6* 27.7*  PLT 248 274 262  LABPROT 27.2* 25.1* 27.5*  INR 2.63* 2.37* 2.67*  CREATININE 1.14* 1.36* 1.41*    Estimated Creatinine Clearance: 27.2 ml/min (by C-G formula based on Cr of 1.41).   Assessment: Anticoagulation: Patient is a 80 y.o. female presented 3/30 from [facility] with SOB and AMS on coumadin PTA for afib. Her INR was supratherapeutic at 3.14 on admission. Her home regimen consists of 4mg  daily except 2mg  on MWF (verified with [facility]).   [name]' INR today is therapeutic at 2.67, which is increased from 2.37 yesterday. Will dose conservatively tonight to avoid a supratherapeutic INR, given a rather large jump from yesterday.   CBC stable, no bleeding noted.   Antibiotics: [name] is on day #7 of antibiotics for pneumonia. Her WBC are wnl, and she remains afebrile. Cultures have been negative to date. Her renal function has been worsening over the past few days, and therefore appropriate dosage adjustments in her vancomycin will be made to avoid accumulation of the medication.   3/30 zosyn>> 3/30 vanc>> (last dose scheduled for 4/6)  3/30 bcx x2>> ngtd MRSA pcr>> neg   Goal of Therapy:  INR 2-3   Plan:  Coumadin 2  mg x 1 PO tonight Daily INR, monitor for s/s bleeding.  Change vancomycin to 750 mg IV q 24 hours Continue Zosyn 3.375g IV q 8 hours F/u cultures, renal function, clinical progression  Brinly Maietta C. Yaremi Stahlman, PharmD Clinical Pharmacist-Resident Pager: [phone] Pharmacy: [phone] 11/16/2013 10:31 AM

## 2013-11-17 LAB — CBC WITH DIFFERENTIAL/PLATELET
Basophils Absolute: 0 10*3/uL (ref 0.0–0.1)
Basophils Relative: 1 % (ref 0–1)
EOS ABS: 0.3 10*3/uL (ref 0.0–0.7)
Eosinophils Relative: 5 % (ref 0–5)
HCT: 28.1 % — ABNORMAL LOW (ref 36.0–46.0)
HEMOGLOBIN: 9.1 g/dL — AB (ref 12.0–15.0)
LYMPHS ABS: 0.8 10*3/uL (ref 0.7–4.0)
Lymphocytes Relative: 13 % (ref 12–46)
MCH: 29.7 pg (ref 26.0–34.0)
MCHC: 32.4 g/dL (ref 30.0–36.0)
MCV: 91.8 fL (ref 78.0–100.0)
MONOS PCT: 11 % (ref 3–12)
Monocytes Absolute: 0.7 10*3/uL (ref 0.1–1.0)
Neutro Abs: 4.3 10*3/uL (ref 1.7–7.7)
Neutrophils Relative %: 70 % (ref 43–77)
Platelets: 266 10*3/uL (ref 150–400)
RBC: 3.06 MIL/uL — ABNORMAL LOW (ref 3.87–5.11)
RDW: 15 % (ref 11.5–15.5)
WBC: 6.2 10*3/uL (ref 4.0–10.5)

## 2013-11-17 LAB — COMPREHENSIVE METABOLIC PANEL
ALT: 8 U/L (ref 0–35)
AST: 15 U/L (ref 0–37)
Albumin: 2.5 g/dL — ABNORMAL LOW (ref 3.5–5.2)
Alkaline Phosphatase: 55 U/L (ref 39–117)
BUN: 17 mg/dL (ref 6–23)
CO2: 20 mEq/L (ref 19–32)
CREATININE: 1.56 mg/dL — AB (ref 0.50–1.10)
Calcium: 8.4 mg/dL (ref 8.4–10.5)
Chloride: 102 mEq/L (ref 96–112)
GFR calc Af Amer: 33 mL/min — ABNORMAL LOW (ref >90)
GFR calc non Af Amer: 28 mL/min — ABNORMAL LOW (ref >90)
Glucose, Bld: 147 mg/dL — ABNORMAL HIGH (ref 70–99)
Potassium: 4.2 mEq/L (ref 3.7–5.3)
Sodium: 140 mEq/L (ref 137–147)
TOTAL PROTEIN: 6.4 g/dL (ref 6.0–8.3)
Total Bilirubin: 0.4 mg/dL (ref 0.3–1.2)

## 2013-11-17 LAB — GLUCOSE, CAPILLARY
GLUCOSE-CAPILLARY: 141 mg/dL — AB (ref 70–99)
GLUCOSE-CAPILLARY: 200 mg/dL — AB (ref 70–99)
Glucose-Capillary: 142 mg/dL — ABNORMAL HIGH (ref 70–99)
Glucose-Capillary: 141 mg/dL — ABNORMAL HIGH (ref 70–99)

## 2013-11-17 LAB — PROTIME-INR
INR: 2.63 — AB (ref 0.00–1.49)
Prothrombin Time: 27.2 seconds — ABNORMAL HIGH (ref 11.6–15.2)

## 2013-11-17 MED ORDER — LEVOFLOXACIN 750 MG PO TABS
750.0000 mg | ORAL_TABLET | Freq: Every day | ORAL | Status: DC
  Administered 2013-11-17 – 2013-11-18 (×2): 750 mg via ORAL
  Filled 2013-11-17 (×2): qty 1

## 2013-11-17 MED ORDER — WARFARIN SODIUM 2 MG PO TABS
2.0000 mg | ORAL_TABLET | Freq: Once | ORAL | Status: AC
  Administered 2013-11-17: 2 mg via ORAL
  Filled 2013-11-17: qty 1

## 2013-11-17 NOTE — Plan of Care (Deleted)
Problem: Phase I Progression Outcomes Goal: Progress activity as tolerated unless otherwise ordered Outcome: Not Progressing Patient is very reluctant to work with PT.

## 2013-11-17 NOTE — Progress Notes (Signed)
Occupational Therapy Treatment Patient Details Name: Sydney [name] MRN: [id number] DOB: [birth year] Today's Date: 11/17/2013    History of present illness Sydney [name] is a pleasant 80 yo female. 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  She has lived at [facility] for the last 2 months and she likes it there.  She reports two weeks of cough and could symptoms. The cough has not improved at all. It is deep but not productive.  Today she develop nausea and vomiting this a.m.Marland Kitchen  Then she showed significant cyanosis.  She is brought to the ER and found to be hypoxic and febrile.  She has a wound to her right heel that is being dressed daily.    OT comments  Pt. Refused to sit EOB stating she was tired. With max encouragement pt. Was agreeable to therapeutic exercise to B UE for ROM and strength. Pt. Has RA and was complaining of stiffness that was limiting participation in ADLs. Pt. Exercise pt. Was able to perform ADLs bed level.    Follow Up Recommendations  SNF;Home health OT (SNF vs. ALF and home health)    Equipment Recommendations  None recommended by OT    Recommendations for Other Services      Precautions / Restrictions Precautions Precautions: Fall Precaution Comments: RLE soft brace at all times; had LLE prothesis (but not here and only uses it to help propel herself in W/C (has only had 2 sessions with OPPT) Restrictions Weight Bearing Restrictions: Yes RLE Weight Bearing: Non weight bearing       Mobility Bed Mobility                  Transfers                      Balance                                   ADL       Grooming: Set up;Bed level           Upper Body Dressing : Set up;Bed level                     General ADL Comments: Pt. refused to sit EOB secondary to fatigue.      Vision                     Perception     Praxis      Cognition   Behavior During Therapy: WFL for tasks  assessed/performed Overall Cognitive Status: Within Functional Limits for tasks assessed                       Extremity/Trunk Assessment               Exercises     Shoulder Instructions       General Comments      Pertinent Vitals/ Pain       No c/o pain  Home Living                                          Prior Functioning/Environment              Frequency Min 2X/week     Progress Toward  Goals  OT Goals(current goals can now be found in the care plan section)  Progress towards OT goals: Progressing toward goals  Acute Rehab OT Goals Patient Stated Goal: back to ALF then OP to work with prothesis  Plan Discharge plan remains appropriate    Co-evaluation                 End of Session     Activity Tolerance Patient limited by fatigue   Patient Left in bed;with call bell/phone within reach;with family/visitor present   Nurse Communication          Time: 1000-1045 OT Time Calculation (min): 45 min  Charges: OT General Charges $OT Visit: 1 Procedure OT Treatments $Self Care/Home Management : 23-37 mins $Therapeutic Exercise: 8-22 mins  Christen Bedoya 11/17/2013, 10:53 AM

## 2013-11-17 NOTE — Care Management Note (Signed)
   CARE MANAGEMENT NOTE 11/17/2013  Patient:  Sydney [name], Sydney [name]   Account Number:  [id number]  Date Initiated:  11/11/2013  Documentation initiated by:  MAYO,HENRIETTA  Subjective/Objective Assessment:   dx PNA/sepsis; resident of [facility]    PCP Dr Burnard Bunting     Action/Plan:   11/17/13 Pt requesting d/c to SNF @ [facility]. CSW assisting pt with this d/c plan.   Anticipated DC Date:  11/18/2013   Anticipated DC Plan:  SKILLED NURSING FACILITY  In-house referral  Clinical Social Worker      DC Planning Services  CM consult      Choice offered to / List presented to:             Status of service:  Completed, signed off Medicare Important Message given?   (If response is "NO", the following Medicare IM given date fields will be blank) Date Medicare IM given:   Date Additional Medicare IM given:    Discharge Disposition:  [facility]  Per UR Regulation:  Reviewed for med. necessity/level of care/duration of stay  If discussed at [place] of Stay Meetings, dates discussed:    Comments:  11/16/13 09:15 CM met with pt in room to discuss LTAC and the benefits of immediate medical intervention vs [facility] SNF. Pt politely but adamantly refuses stating she is very happy at [facility] and plans on being discharge back to [facility].  CM encouraged her to discuss with her MD.  No other CM needs were discussed at this time.  Will continue to monitor for disposition.  Mariane Masters, BSN, CM [phone].

## 2013-11-17 NOTE — Progress Notes (Signed)
ANTICOAGULATION CONSULT NOTE - Follow Up Consult  Pharmacy Consult for coumadin Indication: atrial fibrillation  Allergies  Allergen Reactions  . Other     Narcotics=makes her very disoriented  . Percocet [Oxycodone-Acetaminophen] Other (See Comments)    Dilerium, hallucinations  . Tramadol Anxiety  . Vicodin [Hydrocodone-Acetaminophen] Anxiety    Patient Measurements: Height: 5\' 6"  (167.6 cm) Weight: 162 lb 0.6 oz (73.5 kg) IBW/kg (Calculated) : 59.3   Vital Signs: Temp: 97.2 F (36.2 C) (04/06 0500) Temp src: Oral (04/06 0500) BP: 167/78 mmHg (04/06 0500) Pulse Rate: 100 (04/06 0500)  Labs:  Recent Labs  11/15/13 0332 11/16/13 0635 11/17/13 0450  HGB 9.3* 9.1* 9.1*  HCT 28.6* 27.7* 28.1*  PLT 274 262 266  LABPROT 25.1* 27.5* 27.2*  INR 2.37* 2.67* 2.63*  CREATININE 1.36* 1.41* 1.56*    Estimated Creatinine Clearance: 24.6 ml/min (by C-G formula based on Cr of 1.56).   Assessment: Patient is a 80 y.o. female presented 3/30 from [facility] with SOB, AMS. On coumadin PTA for afib, INR 3.14 on admit. Home dose = 4mg  daily except 2mg  on MWF (verified with [facility]). INR today 2.63  CBC stable, no bleeding noted.   Goal of Therapy:  INR 2-3   Plan:  Coumadin 2 mg x 1 PO tonight Daily INR, monitor for s/s bleeding.   Excell Seltzer, PharmD Clinical Pharmacist  Phone: [phone] Pager: [phone] 11/17/2013 8:59 AM

## 2013-11-17 NOTE — Progress Notes (Signed)
Subjective: Bright, conversant, dtr bedside. Fair pos  Objective: Vital signs in last 24 hours: Temp:  [97.2 F (36.2 C)-98.4 F (36.9 C)] 97.8 F (36.6 C) (04/06 0906) Pulse Rate:  [88-100] 100 (04/06 0906) Resp:  [22-24] 24 (04/06 0906) BP: (147-176)/(78-84) 147/80 mmHg (04/06 0906) SpO2:  [92 %-98 %] 93 % (04/06 0947) Weight change:   CBG (last 3)   Recent Labs  11/16/13 1636 11/17/13 0804 11/17/13 1157  GLUCAP 183* 142* 141*    Intake/Output from previous day: 04/05 0701 - 04/06 0700 In: 360 [P.O.:360] Out: -   Physical Exam:  Patient is bright interactive  No JVD or bruits  Rhonchi bilaterally respiratory rate 18 no distress  Cardiovascular irregular no obvious murmur rate in the 80s  Abdomen soft and nontender  Left stump intact  Right foot dressed with splint in place  Neurologic exam is normal      Lab Results:  Recent Labs  11/16/13 0635 11/17/13 0450  NA 138 140  K 4.6 4.2  CL 104 102  CO2 18* 20  GLUCOSE 140* 147*  BUN 16 17  CREATININE 1.41* 1.56*  CALCIUM 8.2* 8.4    Recent Labs  11/16/13 0635 11/17/13 0450  AST 14 15  ALT 8 8  ALKPHOS 59 55  BILITOT 0.6 0.4  PROT 6.6 6.4  ALBUMIN 2.5* 2.5*    Recent Labs  11/16/13 0635 11/17/13 0450  WBC 6.5 6.2  NEUTROABS 5.0 4.3  HGB 9.1* 9.1*  HCT 27.7* 28.1*  MCV 91.4 91.8  PLT 262 266   Lab Results  Component Value Date   INR 2.63* 11/17/2013   INR 2.67* 11/16/2013   INR 2.37* 11/15/2013   No results found for this basename: CKTOTAL, CKMB, CKMBINDEX, TROPONINI,  in the last 72 hours No results found for this basename: TSH, T4TOTAL, FREET3, T3FREE, THYROIDAB,  in the last 72 hours No results found for this basename: VITAMINB12, FOLATE, FERRITIN, TIBC, IRON, RETICCTPCT,  in the last 72 hours  Studies/Results: No results found.   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 stage III at baseline  #5 coronary artery disease stable  #6 PVD status post left amputation stable  #7 peripheral neuropathy with right foot ulceration stable  #8 protein calorie malnutrition stable  Convert to orals, snf am  LOS: 7 days   Adalynd Donahoe A 11/17/2013, 1:30 PM

## 2013-11-17 NOTE — Progress Notes (Signed)
Physical Therapy Treatment Patient Details Name: Sydney [name] MRN: [id number] DOB: 06-Oct-1920 Today's Date: 11/17/2013    History of Present Illness Else is a pleasant 80 yo female. 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  She has lived at [facility] for the last 2 months and she likes it there.  She reports two weeks of cough and could symptoms. The cough has not improved at all. It is deep but not productive.  Today she develop nausea and vomiting this a.m.Marland Kitchen  Then she showed significant cyanosis.  She is brought to the ER and found to be hypoxic and febrile.  She has a wound to her right heel that is being dressed daily.     PT Comments    Patient able to perform lateral scoot transfer after donning prosthesis.  Happy to be out of bed and improves breathing.  Needs continued rehab at SNF level at d/c.  Follow Up Recommendations  Supervision/Assistance - 24 hour;Outpatient PT     Equipment Recommendations  None recommended by PT    Recommendations for Other Services       Precautions / Restrictions Precautions Precautions: Fall Precaution Comments: RLE soft brace at all times; had LLE prothesis (but not here and only uses it to help propel herself in W/C (has only had 2 sessions with OPPT) Restrictions RLE Weight Bearing: Non weight bearing Other Position/Activity Restrictions: left BKA prosthesis in room    Mobility  Bed Mobility Overal bed mobility: Needs Assistance Bed Mobility: Supine to Sit Rolling: Min guard   Supine to sit: Mod assist;HOB elevated     General bed mobility comments: used rail for assist lifting trunk and assisted with scooting out on bed  Transfers Overall transfer level: Needs assistance   Transfers: Lateral/Scoot Transfers          Lateral/Scoot Transfers: With slide board;+2 physical assistance;Mod assist General transfer comment: with prosthesis  Ambulation/Gait                 Stairs            Wheelchair  Mobility    Modified Rankin (Stroke Patients Only)       Balance Overall balance assessment: Needs assistance Sitting-balance support: Bilateral upper extremity supported Sitting balance-Leahy Scale: Poor Sitting balance - Comments: supports self with hands on bed/rails in sitting edge of bed                            Cognition Arousal/Alertness: Awake/alert Behavior During Therapy: WFL for tasks assessed/performed Overall Cognitive Status: Within Functional Limits for tasks assessed                      Exercises      General Comments        Pertinent Vitals/Pain Min c/o pain right heel, unrated    Home Living                      Prior Function            PT Goals (current goals can now be found in the care plan section) Progress towards PT goals: Progressing toward goals    Frequency  Min 3X/week    PT Plan Current plan remains appropriate    Co-evaluation             End of Session Equipment Utilized During Treatment: Gait belt Activity Tolerance: Patient  tolerated treatment well Patient left: in bed;with call bell/phone within reach     Time:  -     Charges:  $Therapeutic Activity: 23-37 mins                    G Codes:      Gordon Carlson,CYNDI 12/07/13, 3:44 PM Magda Kiel, Foreman 12/07/13

## 2013-11-17 NOTE — Clinical Social Work Placement (Addendum)
Clinical Social Work Department CLINICAL SOCIAL WORK PLACEMENT NOTE 11/17/2013  Patient:  Sydney [name], Sydney [name]  Account Number:  [id number] Admit date:  11/10/2013  Clinical Social Worker:  Crawford Givens, LCSW  Date/time:  11/17/2013 12:00 M  Clinical Social Work is seeking post-discharge placement for this patient at the following level of care:   SKILLED NURSING   (*CSW will update this form in Epic as items are completed)     Patient/family provided with [facility] Department of Clinical Social Work's list of facilities offering this level of care within the geographic area requested by the patient (or if unable, by the patient's family).  11/14/2013  Patient/family informed of their freedom to choose among providers that offer the needed level of care, that participate in Medicare, Medicaid or managed care program needed by the patient, have an available bed and are willing to accept the patient.    Patient/family informed of [facility]' ownership interest in [facility], as well as of the fact that they are under no obligation to receive care at this facility.  PASARR submitted to [employer] in 2011  PASARR number received from [facility] on in 2011  FL2 transmitted to all facilities in geographic area requested by pt/family on  11/17/2013 FL2 transmitted to all facilities within larger geographic area on   Patient informed that his/her managed care company has contracts with or will negotiate with  certain facilities, including the following:     Patient/family informed of bed offers received: 09/19/13  Patient chooses bed at [facility] Physician recommends and patient chooses bed at    Patient to be transferred to [facility] on 11/18/13 Patient to be transferred to facility by ambulance.  The following physician request were entered in Epic:  Additional Comments: 11/17/13: Per Dr. Reynaldo Minium, patient will discharge to SNF on Tuesday, 4/7.

## 2013-11-18 DIAGNOSIS — G609 Hereditary and idiopathic neuropathy, unspecified: Secondary | ICD-10-CM | POA: Diagnosis not present

## 2013-11-18 DIAGNOSIS — E46 Unspecified protein-calorie malnutrition: Secondary | ICD-10-CM | POA: Diagnosis not present

## 2013-11-18 DIAGNOSIS — R0602 Shortness of breath: Secondary | ICD-10-CM | POA: Diagnosis not present

## 2013-11-18 DIAGNOSIS — I4891 Unspecified atrial fibrillation: Secondary | ICD-10-CM | POA: Diagnosis not present

## 2013-11-18 DIAGNOSIS — Z794 Long term (current) use of insulin: Secondary | ICD-10-CM | POA: Diagnosis not present

## 2013-11-18 DIAGNOSIS — J189 Pneumonia, unspecified organism: Secondary | ICD-10-CM | POA: Diagnosis not present

## 2013-11-18 DIAGNOSIS — N183 Chronic kidney disease, stage 3 unspecified: Secondary | ICD-10-CM | POA: Diagnosis not present

## 2013-11-18 DIAGNOSIS — R112 Nausea with vomiting, unspecified: Secondary | ICD-10-CM | POA: Diagnosis not present

## 2013-11-18 DIAGNOSIS — R059 Cough, unspecified: Secondary | ICD-10-CM | POA: Diagnosis not present

## 2013-11-18 DIAGNOSIS — E1129 Type 2 diabetes mellitus with other diabetic kidney complication: Secondary | ICD-10-CM | POA: Diagnosis not present

## 2013-11-18 DIAGNOSIS — I739 Peripheral vascular disease, unspecified: Secondary | ICD-10-CM | POA: Diagnosis not present

## 2013-11-18 DIAGNOSIS — R05 Cough: Secondary | ICD-10-CM | POA: Diagnosis not present

## 2013-11-18 DIAGNOSIS — Z89529 Acquired absence of unspecified knee: Secondary | ICD-10-CM | POA: Diagnosis not present

## 2013-11-18 DIAGNOSIS — Z5189 Encounter for other specified aftercare: Secondary | ICD-10-CM | POA: Diagnosis not present

## 2013-11-18 DIAGNOSIS — I10 Essential (primary) hypertension: Secondary | ICD-10-CM | POA: Diagnosis not present

## 2013-11-18 LAB — GLUCOSE, CAPILLARY
GLUCOSE-CAPILLARY: 103 mg/dL — AB (ref 70–99)
Glucose-Capillary: 114 mg/dL — ABNORMAL HIGH (ref 70–99)

## 2013-11-18 LAB — PROTIME-INR
INR: 2.62 — ABNORMAL HIGH (ref 0.00–1.49)
Prothrombin Time: 27.1 seconds — ABNORMAL HIGH (ref 11.6–15.2)

## 2013-11-18 MED ORDER — LEVOFLOXACIN 750 MG PO TABS
750.0000 mg | ORAL_TABLET | Freq: Every day | ORAL | Status: DC
Start: 2013-11-18 — End: 2014-02-07

## 2013-11-18 MED ORDER — INSULIN ASPART 100 UNIT/ML ~~LOC~~ SOLN: 0.0000 [IU] | Freq: Three times a day (TID) | SUBCUTANEOUS | Status: DC

## 2013-11-18 MED ORDER — WARFARIN SODIUM 4 MG PO TABS
4.0000 mg | ORAL_TABLET | Freq: Once | ORAL | Status: DC
  Filled 2013-11-18: qty 1

## 2013-11-18 MED ORDER — INSULIN GLARGINE 100 UNIT/ML ~~LOC~~ SOLN: 10.0000 [IU] | Freq: Every day | SUBCUTANEOUS | Status: DC

## 2013-11-18 NOTE — Discharge Summary (Signed)
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   Peripheral neuropathy   Essential hypertension   Protein calorie malnutrition    Discharge Medications:   Medication List    STOP taking these medications       fish oil-omega-3 fatty acids 1000 MG capsule     fluticasone 50 MCG/ACT nasal spray  Commonly known as:  FLONASE     ICAPS AREDS FORMULA PO     nortriptyline 10 MG capsule  Commonly known as:  PAMELOR     oxymetazoline 0.05 % nasal spray  Commonly known as:  AFRIN     Vitamin D3 400 UNITS Caps      TAKE these medications       albuterol 108 (90 BASE) MCG/ACT inhaler  Commonly known as:  PROVENTIL HFA;VENTOLIN HFA  Inhale 1 puff into the lungs every 6 (six) hours as needed for wheezing or shortness of breath (or coughing).     cadexomer iodine 0.9 % gel  Commonly known as:  IODOSORB  Apply 1 application topically daily as needed for wound care.     [employer]S CALCIUM SOFT CHEWS PO  Take 1 tablet by mouth 2 (two) times daily.     cyanocobalamin 500 MCG tablet  Take 500 mcg by mouth daily.     diclofenac sodium 1 % Gel  Commonly known as:  VOLTAREN  Apply 2 g topically 4 (four) times daily.     ferrous fumarate-iron polysaccharide complex 162-115.2 MG Caps  Commonly known as:  TANDEM  Take 1 capsule by mouth 2 (two) times daily.     folic acid 1 MG tablet  Commonly known as:  FOLVITE   Take 1 mg by mouth daily.     furosemide 20 MG tablet  Commonly known as:  LASIX  Take 40 mg by mouth daily.     gabapentin 300 MG capsule  Commonly known as:  NEURONTIN  Take 300 mg by mouth 2 (two) times daily.     glimepiride 4 MG tablet  Commonly known as:  AMARYL  Take 2 mg by mouth daily before breakfast.     insulin aspart 100 UNIT/ML injection  Commonly known as:  novoLOG  Inject 0-9 Units into the skin 3 (three) times daily with meals.     insulin glargine 100 UNIT/ML injection  Commonly known as:  LANTUS  Inject 0.1 mLs (10 Units total) into the skin at bedtime.     levofloxacin 750 MG tablet  Commonly known as:  LEVAQUIN  Take 1 tablet (750 mg total) by mouth daily.     levothyroxine 50 MCG tablet  Commonly known as:  SYNTHROID, LEVOTHROID  Take 50 mcg by mouth daily with breakfast.     metFORMIN 500 MG tablet  Commonly known as:  GLUCOPHAGE  Take 1 tablet by mouth daily.     multivitamin with minerals Tabs tablet  Take 1 tablet by mouth daily.     nitroGLYCERIN 0.2 mg/hr patch  Commonly known as:  NITRODUR - Dosed in mg/24 hr  Place 0.2 mg onto the skin daily. On the foot     potassium chloride SA 20 MEQ tablet  Commonly known as:  K-DUR,KLOR-CON  Take 20 mEq by mouth 2 (two) times daily.     protein supplement Powd  Take 1 scoop by mouth 3 (three) times daily with meals.     ROBITUSSIN CHEST CONGESTION 100 MG/5ML syrup  Generic drug:  guaifenesin  Take 200 mg by mouth every 4 (four) hours as needed for cough.     tiotropium 18 MCG inhalation capsule  Commonly known as:  SPIRIVA  Place 18 mcg into inhaler and inhale daily with breakfast.     vitamin C 500 MG tablet  Commonly known as:  ASCORBIC ACID  Take 500 mg by mouth daily.     warfarin 4 MG tablet  Commonly known as:  COUMADIN  Take 2-4 mg by mouth daily. Take 2mg  on Monday ,Wednesday, Friday.  Take 4mg  on Tuesday, Thursday, Saturday, Sunday.        Hospital Procedures: Dg Chest 2  View  11/13/2013   CLINICAL DATA:  Pneumonia.  EXAM: CHEST  2 VIEW  COMPARISON:  DG CHEST 1V PORT dated 11/10/2013  FINDINGS: Mediastinal and hilar structures are normal. Persistent pulmonary infiltrate noted in the right upper and mid lung. Mild bibasilar atelectasis. No pleural effusion or pneumothorax. Heart size and pulmonary vascularity stable. No acute osseous abnormality.  IMPRESSION: Persistent right mid and upper lung pulmonary infiltrate.   Electronically Signed   By: Marcello Moores  Register   On: 11/13/2013 07:19   Ct Head Wo Contrast  11/10/2013   CLINICAL DATA:  Altered mental status, fever, shortness of breath and confusion.  EXAM: CT HEAD WITHOUT CONTRAST  TECHNIQUE: Contiguous axial images were obtained from the base of the skull through the vertex without intravenous contrast.  COMPARISON:  CT HEAD W/O CM dated 06/12/2010; MR HEAD WO/W CM dated 12/01/2007  FINDINGS: There is diffuse cortical atrophy which shows worsening since 2011. Moderately advanced small vessel ischemic changes are seen in the periventricular white matter which have progressed significantly since 2011. The brain demonstrates no evidence of hemorrhage, acute infarction, edema, mass effect, extra-axial fluid collection, hydrocephalus or mass lesion. The skull is unremarkable.  IMPRESSION: Progressive cortical atrophy and small vessel disease since 2011. No acute findings are identified by head CT.   Electronically Signed   By: Aletta Edouard M.D.   On: 11/10/2013 12:35   Dg Chest Port 1 View  11/10/2013   CLINICAL DATA:  Fever and chest pain.  EXAM: PORTABLE CHEST - 1 VIEW  COMPARISON:  DG CHEST 2 VIEW dated 06/18/2013; DG CHEST 2 VIEW dated 06/06/2013; DG CHEST 2 VIEW dated 10/16/2011  FINDINGS: There is a right upper lobe infiltrate present consistent with pneumonia. No edema or pleural fluid is identified. The heart size is normal.  IMPRESSION: Right upper lobe pneumonia.   Electronically Signed   By: Aletta Edouard M.D.   On:  11/10/2013 13:37    History of Present Illness:  Sydney [name] is a pleasant 80 yo female. She has bilateral amputations. She has lived at [facility] for the last 2 months and she likes it there. She reports two weeks of cough and could symptoms. The cough has not improved at all. It is deep but not productive. Today she develop nausea and vomiting this a.m.Marland Kitchen Then she showed significant cyanosis. She is brought to the ER and  found to be hypoxic and febrile. There has been no blood noted above or below. She has a wound to her right heel that is being dressed daily. She will require admission.  Hospital Course: Patient was initially admitted to step down unit for pulmonary toilet and bronchodilators because of work of breathing and air hunger. She was placed on broad-spectrum antibiotics to include Maxipime and vancomycin as well as albuterol nebs and mucolytic. She gradually improved and has been transitioned to oral Levaquin with good success. She does remain with a moist cough but it respiratory status is stabilized, excellent oxygen saturations and clearing lung fields at this time. From a metabolic standpoint blood sugars been well-controlled on insulin and metformin. Right foot ulcers being taken care with topical agents and alleviation of pressure this is a chronic wound and is slowly healing with no evidence of secondary infection. Her current care needs clearly exceeds assisted-living at [facility] and therefore she is transferred back to skilled nursing at [facility]. He went take is gradually improving and she is on nutritional supplements. She has been stable from a cardiopulmonary standpoint.  Day of Discharge Exam BP 147/60  Pulse 93  Temp(Src) 98.5 F (36.9 C) (Oral)  Resp 20  Ht 5\' 6"  (1.676 m)  Wt 74.5 kg (164 lb 3.9 oz)  BMI 26.52 kg/m2  SpO2 96%  Physical Exam: General appearance: alert, cooperative and no distress Eyes: no scleral icterus Throat: oropharynx moist without erythema Resp: clear  to auscultation bilaterally, few scattered rhonchi greatly improved respiratory rate is unlabored Cardio: irregularly irregular rhythm- rate in the 80-90 range Extremities: Left stump is healed no concerns, right partial foot amputation with an ulceration clean poor pulses Neurologically Bright alert nonlateralizing  Discharge Labs:  Recent Labs  11/16/13 0635 11/17/13 0450  NA 138 140  K 4.6 4.2  CL 104 102  CO2 18* 20  GLUCOSE 140* 147*  BUN 16 17  CREATININE 1.41* 1.56*  CALCIUM 8.2* 8.4    Recent Labs  11/16/13 0635 11/17/13 0450  AST 14 15  ALT 8 8  ALKPHOS 59 55  BILITOT 0.6 0.4  PROT 6.6 6.4  ALBUMIN 2.5* 2.5*    Recent Labs  11/16/13 0635 11/17/13 0450  WBC 6.5 6.2  NEUTROABS 5.0 4.3  HGB 9.1* 9.1*  HCT 27.7* 28.1*  MCV 91.4 91.8  PLT 262 266   No results found for this basename: CKTOTAL, CKMB, CKMBINDEX, TROPONINI,  in the last 72 hours No results found for this basename: TSH, T4TOTAL, FREET3, T3FREE, THYROIDAB,  in the last 72 hours No results found for this basename: VITAMINB12, FOLATE, FERRITIN, TIBC, IRON, RETICCTPCT,  in the last 72 hours  Discharge instructions:     Discharge Orders   Future Orders Complete By Expires   Diet - low sodium heart healthy  As directed    Increase activity slowly  As directed       Disposition: Skilled nursing [facility] Follow-up Appts: Follow-up with Dr. Reynaldo Minium at [facility] in after discharge from skilled nursing in the interim she will see the attending physician at [facility].  Call for appointment.  Condition on Discharge: Improved and stable condition  Tests Needing Follow-up: Needs continued aggressive pulmonary toilet, up daily with therapy, close monitoring of protimes and she is on Coumadin and Levaquin finishing a seven-day course. Please note she will need blood sugars checked 3 times daily with sliding scale as well  Signed: Logann Whitebread A 11/18/2013, 7:37 AM

## 2013-11-18 NOTE — Progress Notes (Signed)
Called and gave report to staff of [facility]. IV removed without problem. Tele monitor removed and central tele made aware. Pt will be transported by ambulance service to facility.

## 2013-12-15 DIAGNOSIS — E1149 Type 2 diabetes mellitus with other diabetic neurological complication: Secondary | ICD-10-CM | POA: Diagnosis not present

## 2013-12-15 DIAGNOSIS — I70269 Atherosclerosis of native arteries of extremities with gangrene, unspecified extremity: Secondary | ICD-10-CM | POA: Diagnosis not present

## 2013-12-15 DIAGNOSIS — L97509 Non-pressure chronic ulcer of other part of unspecified foot with unspecified severity: Secondary | ICD-10-CM | POA: Diagnosis not present

## 2013-12-17 DIAGNOSIS — M48062 Spinal stenosis, lumbar region with neurogenic claudication: Secondary | ICD-10-CM | POA: Diagnosis not present

## 2013-12-17 DIAGNOSIS — I739 Peripheral vascular disease, unspecified: Secondary | ICD-10-CM | POA: Diagnosis not present

## 2013-12-17 DIAGNOSIS — D649 Anemia, unspecified: Secondary | ICD-10-CM | POA: Diagnosis not present

## 2013-12-17 DIAGNOSIS — I509 Heart failure, unspecified: Secondary | ICD-10-CM | POA: Diagnosis not present

## 2013-12-17 DIAGNOSIS — J449 Chronic obstructive pulmonary disease, unspecified: Secondary | ICD-10-CM | POA: Diagnosis not present

## 2013-12-17 DIAGNOSIS — I10 Essential (primary) hypertension: Secondary | ICD-10-CM | POA: Diagnosis not present

## 2013-12-17 DIAGNOSIS — E1159 Type 2 diabetes mellitus with other circulatory complications: Secondary | ICD-10-CM | POA: Diagnosis not present

## 2013-12-17 DIAGNOSIS — I4891 Unspecified atrial fibrillation: Secondary | ICD-10-CM | POA: Diagnosis not present

## 2013-12-17 DIAGNOSIS — Z1331 Encounter for screening for depression: Secondary | ICD-10-CM | POA: Diagnosis not present

## 2013-12-25 DIAGNOSIS — M19019 Primary osteoarthritis, unspecified shoulder: Secondary | ICD-10-CM | POA: Diagnosis not present

## 2013-12-25 DIAGNOSIS — M069 Rheumatoid arthritis, unspecified: Secondary | ICD-10-CM | POA: Diagnosis not present

## 2013-12-25 DIAGNOSIS — M25519 Pain in unspecified shoulder: Secondary | ICD-10-CM | POA: Diagnosis not present

## 2014-01-06 DIAGNOSIS — M25559 Pain in unspecified hip: Secondary | ICD-10-CM | POA: Diagnosis not present

## 2014-01-12 DIAGNOSIS — E1149 Type 2 diabetes mellitus with other diabetic neurological complication: Secondary | ICD-10-CM | POA: Diagnosis not present

## 2014-01-12 DIAGNOSIS — I70269 Atherosclerosis of native arteries of extremities with gangrene, unspecified extremity: Secondary | ICD-10-CM | POA: Diagnosis not present

## 2014-01-12 DIAGNOSIS — L97509 Non-pressure chronic ulcer of other part of unspecified foot with unspecified severity: Secondary | ICD-10-CM | POA: Diagnosis not present

## 2014-02-06 ENCOUNTER — Inpatient Hospital Stay (HOSPITAL_COMMUNITY)
Admission: EM | Admit: 2014-02-06 | Discharge: 2014-02-08 | DRG: 313 | Disposition: A | Discharge status: Skilled Nursing Facility | Payer: Medicare Other | Attending: Internal Medicine | Admitting: Internal Medicine

## 2014-02-06 DIAGNOSIS — Z96649 Presence of unspecified artificial hip joint: Secondary | ICD-10-CM

## 2014-02-06 DIAGNOSIS — E1142 Type 2 diabetes mellitus with diabetic polyneuropathy: Secondary | ICD-10-CM | POA: Diagnosis not present

## 2014-02-06 DIAGNOSIS — R0789 Other chest pain: Principal | ICD-10-CM | POA: Diagnosis present

## 2014-02-06 DIAGNOSIS — H3530 Unspecified macular degeneration: Secondary | ICD-10-CM | POA: Diagnosis present

## 2014-02-06 DIAGNOSIS — I739 Peripheral vascular disease, unspecified: Secondary | ICD-10-CM | POA: Diagnosis not present

## 2014-02-06 DIAGNOSIS — J4489 Other specified chronic obstructive pulmonary disease: Secondary | ICD-10-CM

## 2014-02-06 DIAGNOSIS — E1149 Type 2 diabetes mellitus with other diabetic neurological complication: Secondary | ICD-10-CM | POA: Diagnosis present

## 2014-02-06 DIAGNOSIS — S88119A Complete traumatic amputation at level between knee and ankle, unspecified lower leg, initial encounter: Secondary | ICD-10-CM

## 2014-02-06 DIAGNOSIS — E1140 Type 2 diabetes mellitus with diabetic neuropathy, unspecified: Secondary | ICD-10-CM | POA: Diagnosis present

## 2014-02-06 DIAGNOSIS — L97519 Non-pressure chronic ulcer of other part of right foot with unspecified severity: Secondary | ICD-10-CM

## 2014-02-06 DIAGNOSIS — Z79899 Other long term (current) drug therapy: Secondary | ICD-10-CM | POA: Diagnosis not present

## 2014-02-06 DIAGNOSIS — I4891 Unspecified atrial fibrillation: Secondary | ICD-10-CM

## 2014-02-06 DIAGNOSIS — Z87891 Personal history of nicotine dependence: Secondary | ICD-10-CM

## 2014-02-06 DIAGNOSIS — M869 Osteomyelitis, unspecified: Secondary | ICD-10-CM

## 2014-02-06 DIAGNOSIS — J189 Pneumonia, unspecified organism: Secondary | ICD-10-CM

## 2014-02-06 DIAGNOSIS — Z89431 Acquired absence of right foot: Secondary | ICD-10-CM

## 2014-02-06 DIAGNOSIS — R072 Precordial pain: Secondary | ICD-10-CM

## 2014-02-06 DIAGNOSIS — E785 Hyperlipidemia, unspecified: Secondary | ICD-10-CM

## 2014-02-06 DIAGNOSIS — E1159 Type 2 diabetes mellitus with other circulatory complications: Secondary | ICD-10-CM | POA: Diagnosis present

## 2014-02-06 DIAGNOSIS — I482 Chronic atrial fibrillation, unspecified: Secondary | ICD-10-CM

## 2014-02-06 DIAGNOSIS — I10 Essential (primary) hypertension: Secondary | ICD-10-CM

## 2014-02-06 DIAGNOSIS — S88112D Complete traumatic amputation at level between knee and ankle, left lower leg, subsequent encounter: Secondary | ICD-10-CM

## 2014-02-06 DIAGNOSIS — J438 Other emphysema: Secondary | ICD-10-CM | POA: Diagnosis not present

## 2014-02-06 DIAGNOSIS — R52 Pain, unspecified: Secondary | ICD-10-CM | POA: Diagnosis not present

## 2014-02-06 DIAGNOSIS — E119 Type 2 diabetes mellitus without complications: Secondary | ICD-10-CM

## 2014-02-06 DIAGNOSIS — I5031 Acute diastolic (congestive) heart failure: Secondary | ICD-10-CM

## 2014-02-06 DIAGNOSIS — Z7901 Long term (current) use of anticoagulants: Secondary | ICD-10-CM

## 2014-02-06 DIAGNOSIS — E039 Hypothyroidism, unspecified: Secondary | ICD-10-CM | POA: Diagnosis not present

## 2014-02-06 DIAGNOSIS — Z794 Long term (current) use of insulin: Secondary | ICD-10-CM | POA: Diagnosis not present

## 2014-02-06 DIAGNOSIS — R1115 Cyclical vomiting syndrome unrelated to migraine: Secondary | ICD-10-CM | POA: Diagnosis not present

## 2014-02-06 DIAGNOSIS — M86679 Other chronic osteomyelitis, unspecified ankle and foot: Secondary | ICD-10-CM

## 2014-02-06 DIAGNOSIS — I70299 Other atherosclerosis of native arteries of extremities, unspecified extremity: Secondary | ICD-10-CM | POA: Diagnosis present

## 2014-02-06 DIAGNOSIS — N289 Disorder of kidney and ureter, unspecified: Secondary | ICD-10-CM

## 2014-02-06 DIAGNOSIS — M79609 Pain in unspecified limb: Secondary | ICD-10-CM | POA: Diagnosis not present

## 2014-02-06 DIAGNOSIS — I509 Heart failure, unspecified: Secondary | ICD-10-CM | POA: Diagnosis not present

## 2014-02-06 DIAGNOSIS — D649 Anemia, unspecified: Secondary | ICD-10-CM

## 2014-02-06 DIAGNOSIS — G546 Phantom limb syndrome with pain: Secondary | ICD-10-CM

## 2014-02-06 DIAGNOSIS — R079 Chest pain, unspecified: Secondary | ICD-10-CM

## 2014-02-06 DIAGNOSIS — L97509 Non-pressure chronic ulcer of other part of unspecified foot with unspecified severity: Secondary | ICD-10-CM | POA: Diagnosis present

## 2014-02-06 DIAGNOSIS — M069 Rheumatoid arthritis, unspecified: Secondary | ICD-10-CM | POA: Diagnosis present

## 2014-02-06 DIAGNOSIS — Z993 Dependence on wheelchair: Secondary | ICD-10-CM

## 2014-02-06 DIAGNOSIS — J449 Chronic obstructive pulmonary disease, unspecified: Secondary | ICD-10-CM

## 2014-02-07 ENCOUNTER — Encounter (HOSPITAL_COMMUNITY): Payer: Self-pay | Admitting: Emergency Medicine

## 2014-02-07 ENCOUNTER — Emergency Department (HOSPITAL_COMMUNITY): Payer: Medicare Other

## 2014-02-07 DIAGNOSIS — R079 Chest pain, unspecified: Secondary | ICD-10-CM | POA: Diagnosis present

## 2014-02-07 LAB — BASIC METABOLIC PANEL
BUN: 36 mg/dL — AB (ref 6–23)
CO2: 29 mEq/L (ref 19–32)
CREATININE: 1.19 mg/dL — AB (ref 0.50–1.10)
Calcium: 10.1 mg/dL (ref 8.4–10.5)
Chloride: 98 mEq/L (ref 96–112)
GFR calc Af Amer: 45 mL/min — ABNORMAL LOW (ref >90)
GFR, EST NON AFRICAN AMERICAN: 39 mL/min — AB (ref >90)
GLUCOSE: 68 mg/dL — AB (ref 70–99)
Potassium: 4.1 mEq/L (ref 3.7–5.3)
Sodium: 140 mEq/L (ref 137–147)

## 2014-02-07 LAB — TROPONIN I
Troponin I: <0.3 ng/mL (ref <0.30)
Troponin I: <0.3 ng/mL (ref <0.30)
Troponin I: <0.3 ng/mL (ref <0.30)

## 2014-02-07 LAB — CBC
HEMATOCRIT: 31.9 % — AB (ref 36.0–46.0)
HEMOGLOBIN: 10.1 g/dL — AB (ref 12.0–15.0)
MCH: 28.4 pg (ref 26.0–34.0)
MCHC: 31.7 g/dL (ref 30.0–36.0)
MCV: 89.6 fL (ref 78.0–100.0)
Platelets: 228 10*3/uL (ref 150–400)
RBC: 3.56 MIL/uL — ABNORMAL LOW (ref 3.87–5.11)
RDW: 15.3 % (ref 11.5–15.5)
WBC: 5.3 10*3/uL (ref 4.0–10.5)

## 2014-02-07 LAB — GLUCOSE, CAPILLARY
GLUCOSE-CAPILLARY: 148 mg/dL — AB (ref 70–99)
GLUCOSE-CAPILLARY: 116 mg/dL — AB (ref 70–99)
Glucose-Capillary: 101 mg/dL — ABNORMAL HIGH (ref 70–99)
Glucose-Capillary: 118 mg/dL — ABNORMAL HIGH (ref 70–99)
Glucose-Capillary: 185 mg/dL — ABNORMAL HIGH (ref 70–99)
Glucose-Capillary: 40 mg/dL — CL (ref 70–99)
Glucose-Capillary: 49 mg/dL — ABNORMAL LOW (ref 70–99)
Glucose-Capillary: 52 mg/dL — ABNORMAL LOW (ref 70–99)

## 2014-02-07 LAB — PROTIME-INR
INR: 1.56 — ABNORMAL HIGH (ref 0.00–1.49)
Prothrombin Time: 18.7 seconds — ABNORMAL HIGH (ref 11.6–15.2)

## 2014-02-07 LAB — I-STAT TROPONIN, ED: Troponin i, poc: 0.02 ng/mL (ref 0.00–0.08)

## 2014-02-07 LAB — CBG MONITORING, ED: GLUCOSE-CAPILLARY: 60 mg/dL — AB (ref 70–99)

## 2014-02-07 LAB — MRSA PCR SCREENING: MRSA by PCR: NEGATIVE

## 2014-02-07 MED ORDER — ALBUTEROL SULFATE (2.5 MG/3ML) 0.083% IN NEBU: 3.0000 mL | INHALATION_SOLUTION | Freq: Four times a day (QID) | RESPIRATORY_TRACT | Status: DC | PRN

## 2014-02-07 MED ORDER — METFORMIN HCL 500 MG PO TABS
500.0000 mg | ORAL_TABLET | Freq: Every day | ORAL | Status: DC
  Administered 2014-02-07 – 2014-02-08 (×2): 500 mg via ORAL
  Filled 2014-02-07 (×3): qty 1

## 2014-02-07 MED ORDER — GABAPENTIN 300 MG PO CAPS
300.0000 mg | ORAL_CAPSULE | Freq: Two times a day (BID) | ORAL | Status: DC
  Administered 2014-02-07 – 2014-02-08 (×3): 300 mg via ORAL
  Filled 2014-02-07 (×4): qty 1

## 2014-02-07 MED ORDER — ASPIRIN 81 MG PO CHEW
324.0000 mg | CHEWABLE_TABLET | Freq: Once | ORAL | Status: AC
  Administered 2014-02-07: 324 mg via ORAL
  Filled 2014-02-07: qty 4

## 2014-02-07 MED ORDER — ASPIRIN EC 325 MG PO TBEC
325.0000 mg | DELAYED_RELEASE_TABLET | Freq: Every day | ORAL | Status: DC
  Administered 2014-02-08: 325 mg via ORAL
  Filled 2014-02-07: qty 1

## 2014-02-07 MED ORDER — DILTIAZEM HCL 60 MG PO TABS
60.0000 mg | ORAL_TABLET | Freq: Two times a day (BID) | ORAL | Status: DC
  Administered 2014-02-07 – 2014-02-08 (×3): 60 mg via ORAL
  Filled 2014-02-07 (×4): qty 1

## 2014-02-07 MED ORDER — SODIUM CHLORIDE 0.9 % IV SOLN: INTRAVENOUS | Status: DC

## 2014-02-07 MED ORDER — LEVOTHYROXINE SODIUM 50 MCG PO TABS
50.0000 ug | ORAL_TABLET | Freq: Every day | ORAL | Status: DC
  Administered 2014-02-07 – 2014-02-08 (×2): 50 ug via ORAL
  Filled 2014-02-07 (×3): qty 1

## 2014-02-07 MED ORDER — ZOLPIDEM TARTRATE 5 MG PO TABS: 5.0000 mg | ORAL_TABLET | Freq: Every evening | ORAL | Status: DC | PRN

## 2014-02-07 MED ORDER — HYDROMORPHONE HCL PF 1 MG/ML IJ SOLN: 0.5000 mg | INTRAMUSCULAR | Status: DC | PRN

## 2014-02-07 MED ORDER — SENNOSIDES-DOCUSATE SODIUM 8.6-50 MG PO TABS
1.0000 | ORAL_TABLET | Freq: Every evening | ORAL | Status: DC | PRN
  Filled 2014-02-07: qty 1

## 2014-02-07 MED ORDER — FOLIC ACID 1 MG PO TABS
1.0000 mg | ORAL_TABLET | Freq: Every day | ORAL | Status: DC
  Administered 2014-02-07 – 2014-02-08 (×2): 1 mg via ORAL
  Filled 2014-02-07 (×2): qty 1

## 2014-02-07 MED ORDER — WARFARIN - PHARMACIST DOSING INPATIENT: Freq: Every day | Status: DC

## 2014-02-07 MED ORDER — POTASSIUM CHLORIDE CRYS ER 20 MEQ PO TBCR
20.0000 meq | EXTENDED_RELEASE_TABLET | Freq: Two times a day (BID) | ORAL | Status: DC
  Administered 2014-02-07 – 2014-02-08 (×3): 20 meq via ORAL
  Filled 2014-02-07 (×4): qty 1

## 2014-02-07 MED ORDER — INSULIN GLARGINE 100 UNIT/ML ~~LOC~~ SOLN
10.0000 [IU] | Freq: Every day | SUBCUTANEOUS | Status: DC
  Filled 2014-02-07 (×2): qty 0.1

## 2014-02-07 MED ORDER — DICLOFENAC SODIUM 1 % TD GEL
2.0000 g | Freq: Four times a day (QID) | TRANSDERMAL | Status: DC
  Administered 2014-02-07 – 2014-02-08 (×6): 2 g via TOPICAL
  Filled 2014-02-07: qty 100

## 2014-02-07 MED ORDER — CADEXOMER IODINE 0.9 % EX GEL: 1.0000 "application " | CUTANEOUS | Status: DC

## 2014-02-07 MED ORDER — WARFARIN SODIUM 2 MG PO TABS: 2.0000 mg | ORAL_TABLET | Freq: Every day | ORAL | Status: DC

## 2014-02-07 MED ORDER — GLIMEPIRIDE 2 MG PO TABS
2.0000 mg | ORAL_TABLET | Freq: Every day | ORAL | Status: DC
  Administered 2014-02-07 – 2014-02-08 (×2): 2 mg via ORAL
  Filled 2014-02-07 (×3): qty 1

## 2014-02-07 MED ORDER — ONDANSETRON HCL 4 MG/2ML IJ SOLN: 4.0000 mg | Freq: Three times a day (TID) | INTRAMUSCULAR | Status: AC | PRN

## 2014-02-07 MED ORDER — NITROGLYCERIN 0.4 MG SL SUBL: 0.4000 mg | SUBLINGUAL_TABLET | SUBLINGUAL | Status: DC | PRN

## 2014-02-07 MED ORDER — INSULIN ASPART 100 UNIT/ML ~~LOC~~ SOLN: 0.0000 [IU] | Freq: Three times a day (TID) | SUBCUTANEOUS | Status: DC

## 2014-02-07 MED ORDER — TIOTROPIUM BROMIDE MONOHYDRATE 18 MCG IN CAPS
18.0000 ug | ORAL_CAPSULE | Freq: Every day | RESPIRATORY_TRACT | Status: DC
  Administered 2014-02-08: 18 ug via RESPIRATORY_TRACT
  Filled 2014-02-07: qty 5

## 2014-02-07 MED ORDER — INSULIN ASPART 100 UNIT/ML ~~LOC~~ SOLN
0.0000 [IU] | Freq: Three times a day (TID) | SUBCUTANEOUS | Status: DC
  Administered 2014-02-07 – 2014-02-08 (×2): 4 [IU] via SUBCUTANEOUS

## 2014-02-07 MED ORDER — ADULT MULTIVITAMIN W/MINERALS CH
1.0000 | ORAL_TABLET | Freq: Every day | ORAL | Status: DC
  Administered 2014-02-07 – 2014-02-08 (×2): 1 via ORAL
  Filled 2014-02-07 (×2): qty 1

## 2014-02-07 MED ORDER — FUROSEMIDE 40 MG PO TABS
40.0000 mg | ORAL_TABLET | Freq: Every day | ORAL | Status: DC
  Administered 2014-02-07 – 2014-02-08 (×2): 40 mg via ORAL
  Filled 2014-02-07 (×2): qty 1

## 2014-02-07 MED ORDER — NITROGLYCERIN 0.2 MG/HR TD PT24
0.2000 mg | MEDICATED_PATCH | TRANSDERMAL | Status: DC
  Filled 2014-02-07: qty 1

## 2014-02-07 MED ORDER — WARFARIN SODIUM 5 MG PO TABS
5.0000 mg | ORAL_TABLET | Freq: Once | ORAL | Status: AC
  Administered 2014-02-07: 5 mg via ORAL
  Filled 2014-02-07: qty 1

## 2014-02-07 MED ORDER — ALUM & MAG HYDROXIDE-SIMETH 200-200-20 MG/5ML PO SUSP: 30.0000 mL | Freq: Four times a day (QID) | ORAL | Status: DC | PRN

## 2014-02-07 MED ORDER — CYANOCOBALAMIN 500 MCG PO TABS
500.0000 ug | ORAL_TABLET | Freq: Every day | ORAL | Status: DC
  Administered 2014-02-07 – 2014-02-08 (×2): 500 ug via ORAL
  Filled 2014-02-07 (×2): qty 1

## 2014-02-07 MED ORDER — ACETAMINOPHEN 650 MG RE SUPP: 650.0000 mg | Freq: Four times a day (QID) | RECTAL | Status: DC | PRN

## 2014-02-07 MED ORDER — VITAMIN C 500 MG PO TABS
500.0000 mg | ORAL_TABLET | Freq: Every day | ORAL | Status: DC
  Administered 2014-02-07 – 2014-02-08 (×2): 500 mg via ORAL
  Filled 2014-02-07 (×2): qty 1

## 2014-02-07 MED ORDER — SODIUM CHLORIDE 0.9 % IJ SOLN
3.0000 mL | Freq: Two times a day (BID) | INTRAMUSCULAR | Status: DC
  Administered 2014-02-07 (×2): 3 mL via INTRAVENOUS

## 2014-02-07 MED ORDER — BENEPROTEIN PO POWD
1.0000 | Freq: Three times a day (TID) | ORAL | Status: DC
  Administered 2014-02-07 – 2014-02-08 (×5): 6 g via ORAL
  Filled 2014-02-07: qty 227

## 2014-02-07 MED ORDER — NITROGLYCERIN 2 % TD OINT
0.5000 [in_us] | TOPICAL_OINTMENT | Freq: Four times a day (QID) | TRANSDERMAL | Status: DC
  Administered 2014-02-07 – 2014-02-08 (×4): 0.5 [in_us] via TOPICAL
  Filled 2014-02-07: qty 30

## 2014-02-07 MED ORDER — ACETAMINOPHEN 325 MG PO TABS: 650.0000 mg | ORAL_TABLET | Freq: Four times a day (QID) | ORAL | Status: DC | PRN

## 2014-02-07 NOTE — ED Provider Notes (Signed)
CSN: [id number]     Arrival date & time 02/06/14  2359 History   First MD Initiated Contact with Patient 02/07/14 0002     Chief Complaint  Patient presents with  . Chest Pain  . Foot Pain    Right foot     (Consider location/radiation/quality/duration/timing/severity/associated sxs/prior Treatment) HPI Comments: 80 year old female with history of hypothyroidism, diabetes, atrial fibrillation, high blood pressure below the knee amputation on the left, osteomyelitis, diabetic neuropathy, pneumonia presents with chest pressure lasting approximately one hour that resolved on EMS arrival. Pressure was constant with no radiation. Patient's had very mild episode in the past that resolved on its own. No myocardial infarction or heart failure history. No recent surgeries or blood clot history. No shortness of breath and, fevers or chills. Nitroglycerin improves symptoms on route.  Patient is a 80 y.o. female presenting with chest pain and lower extremity pain. The history is provided by the patient.  Chest Pain Associated symptoms: no abdominal pain, no back pain, no cough, no fever, no headache, no shortness of breath and not vomiting   Foot Pain Associated symptoms include chest pain. Pertinent negatives include no abdominal pain, no headaches and no shortness of breath.    Past Medical History  Diagnosis Date  . Lung disease     BRONCHOSPASTIC  . Neuropathy     takes Gabapentin bid  . Hypercholesterolemia     doesn't require meds for this  . Anemia   . COPD (chronic obstructive pulmonary disease)   . Blood transfusion ~ 2012    "once; not related to a surgery or bleeding that I remember" (06/06/2013)  . Hypertension     takes Prinizide daily  . Chronic atrial fibrillation     takes Coumadin as instructed  . Peripheral vascular disease   . CHF (congestive heart failure)     takes Lasix every other day  . Emphysema   . History of head injury     many yrs ago  . Vaginal discharge      has seen GYN but says its nothing to worry about  . Diabetes mellitus     takes Metformin and Amaryl daily  . Hypothyroidism     takes Synthroid daily  . Macular degeneration     dry  . Bronchial pneumonia     "once; several years ago" (06/06/2013)  . Rheumatoid arthritis     "shoulders; daughter gives me shots of Methotrexate q week" (06/06/2013)   Past Surgical History  Procedure Laterality Date  . Total hip arthroplasty Left ?2013  . Back surgery    . Fracture surgery  2011    LEFT HIP FX /REPLACEMENT   . Cholecystectomy  1951  . Dilation and curettage of uterus    . Amputation  11/09/2011    Procedure: AMPUTATION DIGIT;  Surgeon: Mcarthur Rossetti, MD;  Location: [facility];  Service: Orthopedics;  Laterality: Right;  Right Great Toe Amputation  . Amputation  01/05/2012    Procedure: AMPUTATION RAY;  Surgeon: Mcarthur Rossetti, MD;  Location: [facility];  Service: Orthopedics;  Laterality: Left;  Amputation Left 4th and 5th Toes  . Cardioversion    . Amputation  05/31/2012    Procedure: AMPUTATION RAY;  Surgeon: Newt Minion, MD;  Location: [facility];  Service: Orthopedics;  Laterality: Left;  Left foot 1st ray amp  . Amputation  06/21/2012    Procedure: AMPUTATION BELOW KNEE;  Surgeon: Newt Minion, MD;  Location: [facility];  Service: Orthopedics;  Laterality: Left;  Left Below Knee Amputation  . Foot amputation through metatarsal Right 06/06/2013    midfoot/notes 06/06/2013  . Joint replacement    . Appendectomy    . Total abdominal hysterectomy  1975  . Cervical disc surgery      "the top vertebrae" (06/06/2013)  . Cataract extraction w/ intraocular lens  implant, bilateral Bilateral   . Eye surgery      bilateral -cataract surgery  . Amputation Right 06/06/2013    Procedure: RIGHT MIDFOOT AMPUTATION;  Surgeon: Newt Minion, MD;  Location: [facility];  Service: Orthopedics;  Laterality: Right;   Family History  Problem Relation Age of Onset  . Heart disease Mother   .  Stroke Mother   . Heart disease Father   . Stroke Father   . Heart attack Father   . Muscular dystrophy Brother   . Anesthesia problems Neg Hx   . Hypotension Neg Hx   . Malignant hyperthermia Neg Hx   . Pseudochol deficiency Neg Hx    History  Substance Use Topics  . Smoking status: Former Smoker -- 4 years    Types: Cigarettes  . Smokeless tobacco: Never Used     Comment: 06/06/2013 "smoked a little in my teens"  . Alcohol Use: No   OB History   Grav Para Term Preterm Abortions TAB SAB Ect Mult Living                 Review of Systems  Constitutional: Negative for fever and chills.  HENT: Negative for congestion.   Eyes: Negative for visual disturbance.  Respiratory: Negative for cough and shortness of breath.   Cardiovascular: Positive for chest pain. Negative for leg swelling.  Gastrointestinal: Negative for vomiting and abdominal pain.  Genitourinary: Negative for dysuria and flank pain.  Musculoskeletal: Negative for back pain, neck pain and neck stiffness.  Skin: Positive for wound (chronic nonhealing left calcaneus ulcer).  Neurological: Negative for light-headedness and headaches.      Allergies  Other; Percocet; Tramadol; and Vicodin  Home Medications   Prior to Admission medications   Medication Sig Start Date End Date Taking? Authorizing Provider  albuterol (PROVENTIL HFA;VENTOLIN HFA) 108 (90 BASE) MCG/ACT inhaler Inhale 1 puff into the lungs every 6 (six) hours as needed for wheezing or shortness of breath (or coughing). 06/20/13   Bary Leriche, PA-C  cadexomer iodine (IODOSORB) 0.9 % gel Apply 1 application topically daily as needed for wound care.    Historical Provider, MD  Calcium-Vitamin D-Vitamin K ([employer]S CALCIUM SOFT CHEWS PO) Take 1 tablet by mouth 2 (two) times daily.     Historical Provider, MD  cyanocobalamin 500 MCG tablet Take 500 mcg by mouth daily.    Historical Provider, MD  diclofenac sodium (VOLTAREN) 1 % GEL Apply 2 g topically 4 (four)  times daily.    Historical Provider, MD  ferrous fumarate-iron polysaccharide complex (TANDEM) 162-115.2 MG CAPS Take 1 capsule by mouth 2 (two) times daily. 06/20/13   Bary Leriche, PA-C  folic acid (FOLVITE) 1 MG tablet Take 1 mg by mouth daily.    Historical Provider, MD  furosemide (LASIX) 20 MG tablet Take 40 mg by mouth daily.    Historical Provider, MD  gabapentin (NEURONTIN) 300 MG capsule Take 300 mg by mouth 2 (two) times daily.    Historical Provider, MD  glimepiride (AMARYL) 4 MG tablet Take 2 mg by mouth daily before breakfast.     Historical Provider,  MD  guaifenesin (ROBITUSSIN CHEST CONGESTION) 100 MG/5ML syrup Take 200 mg by mouth every 4 (four) hours as needed for cough.    Historical Provider, MD  insulin aspart (NOVOLOG) 100 UNIT/ML injection Inject 0-9 Units into the skin 3 (three) times daily with meals. 11/18/13   Geoffery Lyons, MD  insulin glargine (LANTUS) 100 UNIT/ML injection Inject 0.1 mLs (10 Units total) into the skin at bedtime. 11/18/13   Geoffery Lyons, MD  levofloxacin (LEVAQUIN) 750 MG tablet Take 1 tablet (750 mg total) by mouth daily. 11/18/13   Geoffery Lyons, MD  levothyroxine (SYNTHROID, LEVOTHROID) 50 MCG tablet Take 50 mcg by mouth daily with breakfast.     Historical Provider, MD  metFORMIN (GLUCOPHAGE) 500 MG tablet Take 1 tablet by mouth daily. 05/23/13   Historical Provider, MD  Multiple Vitamin (MULITIVITAMIN WITH MINERALS) TABS Take 1 tablet by mouth daily.    Historical Provider, MD  nitroGLYCERIN (NITRODUR - DOSED IN MG/24 HR) 0.2 mg/hr patch Place 0.2 mg onto the skin daily. On the foot 04/08/13   Historical Provider, MD  potassium chloride SA (K-DUR,KLOR-CON) 20 MEQ tablet Take 20 mEq by mouth 2 (two) times daily.    Historical Provider, MD  protein supplement (RESOURCE BENEPROTEIN) POWD Take 1 scoop by mouth 3 (three) times daily with meals.     Historical Provider, MD  tiotropium (SPIRIVA) 18 MCG inhalation capsule Place 18 mcg into inhaler and  inhale daily with breakfast.     Historical Provider, MD  vitamin C (ASCORBIC ACID) 500 MG tablet Take 500 mg by mouth daily.    Historical Provider, MD  warfarin (COUMADIN) 4 MG tablet Take 2-4 mg by mouth daily. Take 2mg  on Monday ,Wednesday, Friday.  Take 4mg  on Tuesday, Thursday, Saturday, Sunday.    Historical Provider, MD   BP 137/115  Temp(Src) 98.1 F (36.7 C) (Oral)  Resp 13  SpO2 95% Physical Exam  Nursing note and vitals reviewed. Constitutional: She is oriented to person, place, and time. She appears well-developed and well-nourished.  HENT:  Head: Normocephalic and atraumatic.  Eyes: Conjunctivae are normal. Right eye exhibits no discharge. Left eye exhibits no discharge.  Neck: Normal range of motion. Neck supple. No tracheal deviation present.  Cardiovascular: Normal rate and regular rhythm.   Murmur (3+ left sternal border) heard. Pulmonary/Chest: Effort normal and breath sounds normal.  Abdominal: Soft. She exhibits no distension. There is no tenderness. There is no guarding.  Musculoskeletal: She exhibits no edema.  Neurological: She is alert and oriented to person, place, and time.  Skin: Skin is warm.  2 cm foot ulcer base of calcaneus, no surrounding induration or warmth, mild yellow discharge  Psychiatric: She has a normal mood and affect.    ED Course  Procedures (including critical care time) Labs Review Labs Reviewed  CBC - Abnormal; Notable for the following:    RBC 3.56 (*)    Hemoglobin 10.1 (*)    HCT 31.9 (*)    All other components within normal limits  BASIC METABOLIC PANEL - Abnormal; Notable for the following:    Glucose, Bld 68 (*)    BUN 36 (*)    Creatinine, Ser 1.19 (*)    GFR calc non Af Amer 39 (*)    GFR calc Af Amer 45 (*)    All other components within normal limits  PROTIME-INR - Abnormal; Notable for the following:    Prothrombin Time 18.7 (*)    INR 1.56 (*)  All other components within normal limits  GLUCOSE, CAPILLARY -  Abnormal; Notable for the following:    Glucose-Capillary 101 (*)    All other components within normal limits  GLUCOSE, CAPILLARY - Abnormal; Notable for the following:    Glucose-Capillary 148 (*)    All other components within normal limits  CBG MONITORING, ED - Abnormal; Notable for the following:    Glucose-Capillary 60 (*)    All other components within normal limits  MRSA PCR SCREENING  TROPONIN I  TROPONIN I  TROPONIN I  TROPONIN I  TROPONIN I  Randolm Idol, ED    Imaging Review Dg Chest Port 1 View  02/07/2014   CLINICAL DATA:  Chest pain.  EXAM: PORTABLE CHEST - 1 VIEW  COMPARISON:  November 13, 2013.  FINDINGS: The heart size and mediastinal contours are within normal limits. Both lungs are clear. No pneumothorax or pleural effusion is noted. The visualized skeletal structures are unremarkable.  IMPRESSION: No acute cardiopulmonary abnormality seen.   Electronically Signed   By: Sabino Dick M.D.   On: 02/07/2014 01:17     EKG Interpretation   Date/Time:  Saturday February 07 2014 00:06:04 EDT Ventricular Rate:  71 PR Interval:    QRS Duration: 85 QT Interval:  430 QTC Calculation: 467 R Axis:   60 Text Interpretation:  Atrial fibrillation Low voltage, extremity and  precordial leads Confirmed by ZAVITZ  MD, JOSHUA ([id number]) on 02/07/2014  12:26:12 AM      MDM   Final diagnoses:  Acute chest pain  Atrial fibrillation, unspecified   Patient presents with worsening chest pressure then previous and known atrial fibrillation history. Patient says she cannot have aspirin due to bleeding risk on Coumadin. No chest pain or pressure currently in no symptoms in the ER currently. Plan for cardiac workup and observation in the hospital.  Patient chest pain-free on recheck and I discussed plan for observation. I discussed the case with primary care Dr. on call who agrees with plan. Patient agreed to take aspirin with cardiac workup plan  The patients results and plan were  reviewed and discussed.   Any x-rays performed were personally reviewed by myself.   Differential diagnosis were considered with the presenting HPI.  Medications  ondansetron (ZOFRAN) injection 4 mg (not administered)  nitroGLYCERIN (NITROSTAT) SL tablet 0.4 mg (not administered)  diltiazem (CARDIZEM) tablet 60 mg (not administered)  diclofenac sodium (VOLTAREN) 1 % transdermal gel 2 g (not administered)  furosemide (LASIX) tablet 40 mg (not administered)  insulin aspart (novoLOG) injection 0-9 Units (not administered)  insulin glargine (LANTUS) injection 10 Units (not administered)  potassium chloride SA (K-DUR,KLOR-CON) CR tablet 20 mEq (not administered)  metFORMIN (GLUCOPHAGE) tablet 500 mg (not administered)  albuterol (PROVENTIL) (2.5 MG/3ML) 0.083% nebulizer solution 3 mL (not administered)  gabapentin (NEURONTIN) capsule 300 mg (not administered)  protein supplement (RESOURCE BENEPROTEIN) powder 6 g (not administered)  nitroGLYCERIN (NITRODUR - Dosed in mg/24 hr) patch 0.2 mg (not administered)  cyanocobalamin tablet 500 mcg (not administered)  folic acid (FOLVITE) tablet 1 mg (not administered)  vitamin C (ASCORBIC ACID) tablet 500 mg (not administered)  multivitamin with minerals tablet 1 tablet (not administered)  glimepiride (AMARYL) tablet 2 mg (not administered)  levothyroxine (SYNTHROID, LEVOTHROID) tablet 50 mcg (not administered)  tiotropium (SPIRIVA) inhalation capsule 18 mcg (not administered)  sodium chloride 0.9 % injection 3 mL (not administered)  0.9 %  sodium chloride infusion (not administered)  acetaminophen (TYLENOL) tablet 650 mg (not administered)  Or  acetaminophen (TYLENOL) suppository 650 mg (not administered)  HYDROmorphone (DILAUDID) injection 0.5 mg (not administered)  zolpidem (AMBIEN) tablet 5 mg (not administered)  senna-docusate (Senokot-S) tablet 1 tablet (not administered)  alum & mag hydroxide-simeth (MAALOX/MYLANTA) 200-200-20 MG/5ML  suspension 30 mL (not administered)  aspirin EC tablet 325 mg (not administered)  aspirin chewable tablet 324 mg (324 mg Oral Given 02/07/14 0336)     Filed Vitals:   02/07/14 0225 02/07/14 0300 02/07/14 0330 02/07/14 0400  BP: 127/53 116/51 119/69 152/57  Pulse: 58 58 64 65  Temp:    97.6 F (36.4 C)  TempSrc:      Resp: 26 16 17 16   Height:    5\' 6"  (1.676 m)  Weight:    156 lb (70.761 kg)  SpO2: 97% 98% 98% 100%    Admission/ observation were discussed with the admitting physician, patient and/or family and they are comfortable with the plan.      Mariea Clonts, MD 02/07/14 [phone]

## 2014-02-07 NOTE — Progress Notes (Signed)
ANTICOAGULATION CONSULT NOTE - Initial Consult  Pharmacy Consult for Coumadin Indication: atrial fibrillation  Allergies  Allergen Reactions  . Other     Narcotics=makes her very disoriented  . Percocet [Oxycodone-Acetaminophen] Other (See Comments)    Dilerium, hallucinations  . Tramadol Anxiety  . Vicodin [Hydrocodone-Acetaminophen] Anxiety    Patient Measurements: Height: 5\' 6"  (167.6 cm) Weight: 156 lb (70.761 kg) IBW/kg (Calculated) : 59.3  Vital Signs: Temp: 98.3 F (36.8 C) (06/27 1401) Temp src: Oral (06/27 1401) BP: 118/40 mmHg (06/27 1401) Pulse Rate: 69 (06/27 1401)  Labs:  Recent Labs  02/07/14 0018 02/07/14 0312 02/07/14 0809  HGB 10.1*  --   --   HCT 31.9*  --   --   PLT 228  --   --   LABPROT 18.7*  --   --   INR 1.56*  --   --   CREATININE 1.19*  --   --   TROPONINI <0.30 <0.30 <0.30    Estimated Creatinine Clearance: 29.4 ml/min (by C-G formula based on Cr of 1.19).   Medical History: Past Medical History  Diagnosis Date  . Lung disease     BRONCHOSPASTIC  . Neuropathy     takes Gabapentin bid  . Hypercholesterolemia     doesn't require meds for this  . Anemia   . COPD (chronic obstructive pulmonary disease)   . Blood transfusion ~ 2012    "once; not related to a surgery or bleeding that I remember" (06/06/2013)  . Hypertension     takes Prinizide daily  . Chronic atrial fibrillation     takes Coumadin as instructed  . Peripheral vascular disease   . CHF (congestive heart failure)     takes Lasix every other day  . Emphysema   . History of head injury     many yrs ago  . Vaginal discharge     has seen GYN but says its nothing to worry about  . Diabetes mellitus     takes Metformin and Amaryl daily  . Hypothyroidism     takes Synthroid daily  . Macular degeneration     dry  . Bronchial pneumonia     "once; several years ago" (06/06/2013)  . Rheumatoid arthritis     "shoulders; daughter gives me shots of Methotrexate q  week" (06/06/2013)   Assessment:   Admitted last night with chest pain, feeling better now. INR is subtherapeutic (1.56).  Per facility [facility], Coumadin dose just changed on 6/24 to 3 mg daily. Had been 2 mg on MWF and 4 mg on TTSS for quite some time per daughter, who wasn't aware of the dosage adjustment.   Aspirin 325 mg daily added here.  Goal of Therapy:  INR 2-3 Monitor platelets by anticoagulation protocol: Yes   Plan:    Increase Coumadin to 5 mg x 1 today.   Daily PT/INR.   Stop Aspirin 325 mg or decrease to 81 mg?  Arty Baumgartner, [place] Pager: [phone] 02/07/2014,2:53 PM

## 2014-02-07 NOTE — H&P (Signed)
Sydney [name] is an 80 y.o. female.   Chief Complaint: chest pain HPI:  The patient is a 80 year old Caucasian woman who is well-known to me. She is a long-term resident of the [facility] assisted living facility. Last night when she was lying in bed she developed substernal chest pain of up to 9/10 intensity it was not associated with shortness of breath, diaphoresis, or nausea. The symptoms did not spontaneously resolve, so she spoke with nursing personnel at the facility, 911 was called, and she was transported to the emergency room. En route to the emergency room she was given nitroglycerin sublingual and her chest pain resolved entirely without recurrence. She has had similar very brief and mild episodes of chest pain, and is on a nitroglycerin patch for this. She has no known coronary artery disease,  However she has a history of chronic atrial fibrillation for which she is on Coumadin and Cardizem, and she also has a history of diabetes mellitus, type II complicated by neuropathy, peripheral vascular disease, and a left below knee amputation. Currently, she no longer has chest discomfort or shortness of breath. She has had some intermittent right foot aching pain, and she has a chronic posterior right heel ulcer since January 2015. She is very concerned that she could need a right below-knee amputation in the future. She is followed closely by her orthopedist, Dr. Sharol Given, for this. In the past she was treated at the [facility] with hyperbaric oxygen for her non-healing left foot ulcer, which was ineffective. She is wheelchair bound due to her left BKA and chronic right heel ulcer. She has no history of GERD.   Past Medical History  Diagnosis Date  . Lung disease     BRONCHOSPASTIC  . Neuropathy     takes Gabapentin bid  . Hypercholesterolemia     doesn't require meds for this  . Anemia   . COPD (chronic obstructive pulmonary disease)   . Blood transfusion ~ 2012    "once; not related to a surgery  or bleeding that I remember" (06/06/2013)  . Hypertension     takes Prinizide daily  . Chronic atrial fibrillation     takes Coumadin as instructed  . Peripheral vascular disease   . CHF (congestive heart failure)     takes Lasix every other day  . Emphysema   . History of head injury     many yrs ago  . Vaginal discharge     has seen GYN but says its nothing to worry about  . Diabetes mellitus     takes Metformin and Amaryl daily  . Hypothyroidism     takes Synthroid daily  . Macular degeneration     dry  . Bronchial pneumonia     "once; several years ago" (06/06/2013)  . Rheumatoid arthritis     "shoulders; daughter gives me shots of Methotrexate q week" (06/06/2013)    Medications Prior to Admission  Medication Sig Dispense Refill  . albuterol (PROVENTIL HFA;VENTOLIN HFA) 108 (90 BASE) MCG/ACT inhaler Inhale 1 puff into the lungs every 6 (six) hours as needed for wheezing or shortness of breath (or coughing).  1 Inhaler  0  . cadexomer iodine (IODOSORB) 0.9 % gel Apply 1 application topically daily as needed for wound care.      . Calcium-Vitamin D-Vitamin K ([employer] CALCIUM SOFT CHEWS PO) Take 1 tablet by mouth 2 (two) times daily.       . cyanocobalamin 500 MCG tablet Take 500 mcg by  mouth daily.      . diclofenac sodium (VOLTAREN) 1 % GEL Apply 2 g topically 4 (four) times daily.      Marland Kitchen diltiazem (CARDIZEM) 60 MG tablet Take 60 mg by mouth 2 (two) times daily.      . folic acid (FOLVITE) 1 MG tablet Take 1 mg by mouth daily.      . furosemide (LASIX) 20 MG tablet Take 40 mg by mouth daily.      Marland Kitchen gabapentin (NEURONTIN) 300 MG capsule Take 300 mg by mouth 2 (two) times daily.      Marland Kitchen glimepiride (AMARYL) 4 MG tablet Take 2 mg by mouth daily before breakfast.       . insulin aspart (NOVOLOG) 100 UNIT/ML injection Inject 0-9 Units into the skin 3 (three) times daily with meals.  10 mL  11  . insulin glargine (LANTUS) 100 UNIT/ML injection Inject 0.1 mLs (10 Units total) into the  skin at bedtime.  10 mL  11  . levothyroxine (SYNTHROID, LEVOTHROID) 50 MCG tablet Take 50 mcg by mouth daily with breakfast.       . metFORMIN (GLUCOPHAGE) 500 MG tablet Take 500 mg by mouth daily.       . Multiple Vitamin (MULITIVITAMIN WITH MINERALS) TABS Take 1 tablet by mouth daily.      . nitroGLYCERIN (NITRODUR - DOSED IN MG/24 HR) 0.2 mg/hr patch Place 0.2 mg onto the skin daily. On the foot      . potassium chloride SA (K-DUR,KLOR-CON) 20 MEQ tablet Take 20 mEq by mouth 2 (two) times daily.      . protein supplement (RESOURCE BENEPROTEIN) POWD Take 1 scoop by mouth 3 (three) times daily with meals.       . tiotropium (SPIRIVA) 18 MCG inhalation capsule Place 18 mcg into inhaler and inhale daily with breakfast.       . vitamin C (ASCORBIC ACID) 500 MG tablet Take 500 mg by mouth daily.      Marland Kitchen warfarin (COUMADIN) 4 MG tablet Take 2-4 mg by mouth daily. Take 67m on Monday ,Wednesday, Friday.  Take 421mon Tuesday, Thursday, Saturday, Sunday.        ADDITIONAL HOME MEDICATIONS: no additional home medications  PHYSICIANS INVOLVED IN CARE: RiBurnard BuntingPCP), MaMeridee Scoreortho), BrKirk Ruthscard)  Past Surgical History  Procedure Laterality Date  . Total hip arthroplasty Left ?2013  . Back surgery    . Fracture surgery  2011    LEFT HIP FX /REPLACEMENT   . Cholecystectomy  1951  . Dilation and curettage of uterus    . Amputation  11/09/2011    Procedure: AMPUTATION DIGIT;  Surgeon: ChMcarthur RossettiMD;  [facility]: [facility] Service: Orthopedics;  Laterality: Right;  Right Great Toe Amputation  . Amputation  01/05/2012    Procedure: AMPUTATION RAY;  Surgeon: ChMcarthur RossettiMD;  Location: [facility];  Service: Orthopedics;  Laterality: Left;  Amputation Left 4th and 5th Toes  . Cardioversion    . Amputation  05/31/2012    Procedure: AMPUTATION RAY;  Surgeon: MaNewt MinionMD;  Location: [facility] Service: Orthopedics;  Laterality: Left;  Left foot 1st ray amp  .  Amputation  06/21/2012    Procedure: AMPUTATION BELOW KNEE;  Surgeon: MaNewt MinionMD;  [facility] Service: [facility];  Laterality: Left;  Left Below Knee Amputation  . Foot amputation through metatarsal Right 06/06/2013    midfoot/notes 06/06/2013  . Joint replacement    .  Appendectomy    . Total abdominal hysterectomy  1975  . Cervical disc surgery      "the top vertebrae" (06/06/2013)  . Cataract extraction w/ intraocular lens  implant, bilateral Bilateral   . Eye surgery      bilateral -cataract surgery  . Amputation Right 06/06/2013    Procedure: RIGHT MIDFOOT AMPUTATION;  Surgeon: Newt Minion, MD;  Location: [facility];  Service: Orthopedics;  Laterality: Right;    Family History  Problem Relation Age of Onset  . Heart disease Mother   . Stroke Mother   . Heart disease Father   . Stroke Father   . Heart attack Father   . Muscular dystrophy Brother   . Anesthesia problems Neg Hx   . Hypotension Neg Hx   . Malignant hyperthermia Neg Hx   . Pseudochol deficiency Neg Hx      Social History:  reports that she has quit smoking. Her smoking use included Cigarettes. She smoked 0.00 packs per day for 4 years. She has never used smokeless tobacco. She reports that she does not drink alcohol or use illicit drugs.  Allergies:  Allergies  Allergen Reactions  . Other     Narcotics=makes her very disoriented  . Percocet [Oxycodone-Acetaminophen] Other (See Comments)    Dilerium, hallucinations  . Tramadol Anxiety  . Vicodin [Hydrocodone-Acetaminophen] Anxiety     ROS: anemia, arthritis, diabetes, emphysema, heart murmur, high blood pressure, kidney disease and chronic atrial fibrillation, coumadin anticoagulation, PVD, peripheral neuropathy, rheumatoid arthritis  PHYSICAL EXAM: Blood pressure 152/57, pulse 65, temperature 97.6 F (36.4 C), temperature source Oral, resp. rate 16, height 5' 6" (1.676 m), weight 70.761 kg (156 lb), SpO2 100.00%. In general, the patient  is a well-nourished well-developed elderly white woman who was in no apparent distress while lying partially upright in bed. HEENT exam was within normal limits, neck was supple without jugular venous distention or carotid bruit, chest was clear to auscultation, heart had an irregularly irregular rhythm at normal rate with a systolic ejection murmur grade 3/6 at the left sternal border, abdomen had normal bowel sounds no tenderness, she is status post left below knee amputation. Her right pedal pulses are 1+ and she has an approximately 1 cm diameter shallow ulcer extending through the epidermis but without exposed bone or tendon, and no signs of infection. She was alert and well oriented, and able to move all extremities somewhat.  Results for orders placed during the hospital encounter of 02/06/14 (from the past 48 hour(s))  CBC     Status: Abnormal   Collection Time    02/07/14 12:18 AM      Result Value Ref Range   WBC 5.3  4.0 - 10.5 K/uL   RBC 3.56 (*) 3.87 - 5.11 MIL/uL   Hemoglobin 10.1 (*) 12.0 - 15.0 g/dL   HCT 31.9 (*) 36.0 - 46.0 %   MCV 89.6  78.0 - 100.0 fL   MCH 28.4  26.0 - 34.0 pg   MCHC 31.7  30.0 - 36.0 g/dL   RDW 15.3  11.5 - 15.5 %   Platelets 228  150 - 400 K/uL  BASIC METABOLIC PANEL     Status: Abnormal   Collection Time    02/07/14 12:18 AM      Result Value Ref Range   Sodium 140  137 - 147 mEq/L   Potassium 4.1  3.7 - 5.3 mEq/L   Chloride 98  96 - 112 mEq/L   CO2 29  19 - 32 mEq/L   Glucose, Bld 68 (*) 70 - 99 mg/dL   BUN 36 (*) 6 - 23 mg/dL   Creatinine, Ser 1.19 (*) 0.50 - 1.10 mg/dL   Calcium 10.1  8.4 - 10.5 mg/dL   GFR calc non Af Amer 39 (*) >90 mL/min   GFR calc Af Amer 45 (*) >90 mL/min   Comment: (NOTE)     The eGFR has been calculated using the CKD EPI equation.     This calculation has not been validated in all clinical situations.     eGFR's persistently <90 mL/min signify possible Chronic Kidney     Disease.  PROTIME-INR     Status: Abnormal    Collection Time    02/07/14 12:18 AM      Result Value Ref Range   Prothrombin Time 18.7 (*) 11.6 - 15.2 seconds   INR 1.56 (*) 0.00 - 1.49  TROPONIN I     Status: None   Collection Time    02/07/14 12:18 AM      Result Value Ref Range   Troponin I <0.30  <0.30 ng/mL   Comment:            Due to the release kinetics of cTnI,     a negative result within the first hours     of the onset of symptoms does not rule out     myocardial infarction with certainty.     If myocardial infarction is still suspected,     repeat the test at appropriate intervals.  Randolm Idol, ED     Status: None   Collection Time    02/07/14 12:22 AM      Result Value Ref Range   Troponin i, poc 0.02  0.00 - 0.08 ng/mL   Comment 3            Comment: Due to the release kinetics of cTnI,     a negative result within the first hours     of the onset of symptoms does not rule out     myocardial infarction with certainty.     If myocardial infarction is still suspected,     repeat the test at appropriate intervals.  TROPONIN I     Status: None   Collection Time    02/07/14  3:12 AM      Result Value Ref Range   Troponin I <0.30  <0.30 ng/mL   Comment:            Due to the release kinetics of cTnI,     a negative result within the first hours     of the onset of symptoms does not rule out     myocardial infarction with certainty.     If myocardial infarction is still suspected,     repeat the test at appropriate intervals.  CBG MONITORING, ED     Status: Abnormal   Collection Time    02/07/14  3:24 AM      Result Value Ref Range   Glucose-Capillary 60 (*) 70 - 99 mg/dL  GLUCOSE, CAPILLARY     Status: Abnormal   Collection Time    02/07/14  4:17 AM      Result Value Ref Range   Glucose-Capillary 101 (*) 70 - 99 mg/dL  GLUCOSE, CAPILLARY     Status: Abnormal   Collection Time    02/07/14  7:24 AM      Result Value Ref Range  Glucose-Capillary 148 (*) 70 - 99 mg/dL   Dg Chest Port 1  View  02/07/2014   CLINICAL DATA:  Chest pain.  EXAM: PORTABLE CHEST - 1 VIEW  COMPARISON:  November 13, 2013.  FINDINGS: The heart size and mediastinal contours are within normal limits. Both lungs are clear. No pneumothorax or pleural effusion is noted. The visualized skeletal structures are unremarkable.  IMPRESSION: No acute cardiopulmonary abnormality seen.   Electronically Signed   By: Sabino Dick M.D.   On: 02/07/2014 01:17    today 12-lead EKG shows following: Atrial fibrillation at an average ventricular rate of 71, low-voltage QRS in limb leads in chest leads  November 2014 transthoracic echocardiogram showed left ventricular ejection fraction 60% with aortic valve sclerosis, mild aortic insufficiency, mild mitral regurgitation, and right atrial enlargement  Assessment/Plan #1 Chest Pain:  Somewhat atypical for CAD, but likely from angina given her history of DM complicated by PVD and neuropathy. Will check serial cardiac isoenzymes to excluded non-q-wave MI, and if normal will discharge on increased antianginal meds and arrange for an expeditious cardiologist evaluation for consideration of a risk stratification procedure. #2 DM2: stable on current meds. #3 Anticoagulation: stable and we will ask pharmacist to assist with coumadin dosing #4 Right Foot Ulcer: due to neuropathy and PVD. Will try adding nitropaste over PT artery to improve blood flow to the region, consider adding pletal, continue iodosorb and pressure relief.  PATERSON,DANIEL G 02/07/2014, 7:56 AM

## 2014-02-07 NOTE — ED Notes (Signed)
Pt states that she started having pressure across her chest at 10:30. EMS reports that the pt has had pain like this before, but it has never stayed this long before. Pt takes warfarin and cardizem. Pt with left sided BKA, and a partial amputation of the right foot. Pt denies SOB diaphoresis, or nausea. Pt received 1 NTG en route, which relieved the pt's pain.

## 2014-02-08 DIAGNOSIS — I739 Peripheral vascular disease, unspecified: Secondary | ICD-10-CM | POA: Diagnosis present

## 2014-02-08 DIAGNOSIS — L97519 Non-pressure chronic ulcer of other part of right foot with unspecified severity: Secondary | ICD-10-CM | POA: Diagnosis present

## 2014-02-08 LAB — GLUCOSE, CAPILLARY
GLUCOSE-CAPILLARY: 76 mg/dL (ref 70–99)
Glucose-Capillary: 168 mg/dL — ABNORMAL HIGH (ref 70–99)

## 2014-02-08 LAB — PROTIME-INR
INR: 1.72 — ABNORMAL HIGH (ref 0.00–1.49)
PROTHROMBIN TIME: 20.2 s — AB (ref 11.6–15.2)

## 2014-02-08 MED ORDER — ASPIRIN 325 MG PO TBEC: 325.0000 mg | DELAYED_RELEASE_TABLET | Freq: Every day | ORAL | Status: DC

## 2014-02-08 MED ORDER — WARFARIN SODIUM 4 MG PO TABS: ORAL_TABLET | ORAL | Status: DC

## 2014-02-08 MED ORDER — NITROGLYCERIN 2 % TD OINT: 0.5000 [in_us] | TOPICAL_OINTMENT | Freq: Two times a day (BID) | TRANSDERMAL | Status: DC

## 2014-02-08 MED ORDER — NITROGLYCERIN 0.4 MG SL SUBL: 0.4000 mg | SUBLINGUAL_TABLET | SUBLINGUAL | Status: DC | PRN

## 2014-02-08 NOTE — Clinical Social Work Note (Signed)
CSW made aware by RN patient ready for d/c to [facility]. CSW contacted facility and confirmed patient return. CSW met with patient who is agreeable to return to facility. CSW asked patient about transportation. Patient requested CSW contact her daughter York Cerise. CSW contacted York Cerise who is agreeable to patient return to facility and requested patient be transported via [place].  CSW prepared d/c packet and placed in patient's shadow chart. CSW made RN, Marcie Bal, aware of number for report. CSW to arrange transportation via [place]. No further needs. CSW signing off.   [place], [place] Weekend Clinical Social Worker [phone]

## 2014-02-08 NOTE — Progress Notes (Signed)
Report called to [place] at [facility]

## 2014-02-08 NOTE — Discharge Summary (Signed)
Physician Discharge Summary  Patient ID: Sydney [name] MRN: [id number] DOB/AGE: 78-Oct-1922 60 y.o.  Admit date: 02/06/2014 Discharge date: 02/08/2014   Discharge Diagnoses:  Principal Problem:   Acute chest pain Active Problems:   DIABETES MELLITUS, TYPE II   HYPERTENSION   Atrial fibrillation   Foot ulcer, right   PVD (peripheral vascular disease)   Diabetic neuropathy   COPD   Chest pain   Discharged Condition: good  Hospital Course: The patient is a 80 year old Caucasian woman who is well-known to me. She is a long-term resident of the [facility] assisted living facility. Last night when she was lying in bed she developed substernal chest pain of up to 9/10 intensity it was not associated with shortness of breath, diaphoresis, or nausea. The symptoms did not spontaneously resolve, so she spoke with nursing personnel at the facility, 911 was called, and she was transported to the emergency room. En route to the emergency room she was given nitroglycerin sublingual and her chest pain resolved entirely without recurrence. She has had similar very brief and mild episodes of chest pain, and is on a nitroglycerin patch for this. She has no known coronary artery disease, However she has a history of chronic atrial fibrillation for which she is on Coumadin and Cardizem, and she also has a history of diabetes mellitus, type II complicated by neuropathy, peripheral vascular disease, and a left below knee amputation. Currently, she no longer has chest discomfort or shortness of breath. She has had some intermittent right foot aching pain, and she has a chronic posterior right heel ulcer since January 2015. She is very concerned that she could need a right below-knee amputation in the future. She is followed closely by her orthopedist, Dr. Sharol Given, for this. In the past she was treated at the [facility] with hyperbaric oxygen for her non-healing left foot ulcer, which was ineffective. She is wheelchair bound  due to her left BKA and chronic right heel ulcer. She has no history of GERD.   She was admitted to a medical bed with telemetry and remained in atrial fibrillation with a controlled ventricular rate. She had serial cardiac enzymes done which were normal. Medication changes included the addition of aspirin 325 mg daily and nitropaste to her right foot which appeared to help her right foot pain. On the day of discharge she felt fine with no headache, dyspnea, or chest pain, and she looked forward to returning to her assisted living facility.   Consults: None  Significant Diagnostic Studies:  Dg Chest Port 1 View  02/07/2014   CLINICAL DATA:  Chest pain.  EXAM: PORTABLE CHEST - 1 VIEW  COMPARISON:  November 13, 2013.  FINDINGS: The heart size and mediastinal contours are within normal limits. Both lungs are clear. No pneumothorax or pleural effusion is noted. The visualized skeletal structures are unremarkable.  IMPRESSION: No acute cardiopulmonary abnormality seen.   Electronically Signed   By: Sabino Dick M.D.   On: 02/07/2014 01:17    Labs: Lab Results  Component Value Date   WBC 5.3 02/07/2014   HGB 10.1* 02/07/2014   HCT 31.9* 02/07/2014   MCV 89.6 02/07/2014   PLT 228 02/07/2014     Recent Labs Lab 02/07/14 0018  NA 140  K 4.1  CL 98  CO2 29  BUN 36*  CREATININE 1.19*  CALCIUM 10.1  GLUCOSE 68*       Lab Results  Component Value Date   INR 1.72* 02/08/2014   INR  1.56* 02/07/2014   INR 2.62* 11/18/2013     Recent Results (from the past 240 hour(s))  MRSA PCR SCREENING     Status: None   Collection Time    02/07/14  7:00 AM      Result Value Ref Range Status   MRSA by PCR NEGATIVE  NEGATIVE Final   Comment:            The GeneXpert MRSA Assay (FDA     approved for NASAL specimens     only), is one component of a     comprehensive MRSA colonization     surveillance program. It is not     intended to diagnose MRSA     infection nor to guide or     monitor treatment for      MRSA infections.      Discharge Exam: Blood pressure 147/49, pulse 64, temperature 98.3 F (36.8 C), temperature source Oral, resp. rate 18, height 5\' 6"  (1.676 m), weight 68.811 kg (151 lb 11.2 oz), SpO2 100.00%.  Physical Exam: In general, she is a pleasant, elderly white woman who was in no apparent distress sitting upright in bed reading the newspaper. HEENT exam was normal, neck was supple and without JVD or carotid bruit, chest was clear, heart had an irregular rhythm with a 2/6 SEM, abdomen had normal bowel sounds and no tenderness, extremities showed a left BKA, a right TMA with a shallow ulcer on the posterior heel. She was alert and well oriented with no focal neurologic deficits.  Disposition:  She will be discharged from the hospital to return to the [facility] living facility for continued longterm care.  Discharge Instructions   Call MD for:    Complete by:  As directed   Call for recurrent chest pain, difficulty breathing, or other concerning symptoms     Diet - low sodium heart healthy    Complete by:  As directed      Discharge instructions    Complete by:  As directed   Use pillows or PRAFO boot to keep pressure off the the right heel at all times     Discharge wound care:    Complete by:  As directed   Apply iodosorb covered by gauze to right posterior heel three times weekly     Increase activity slowly    Complete by:  As directed             Medication List         albuterol 108 (90 BASE) MCG/ACT inhaler  Commonly known as:  PROVENTIL HFA;VENTOLIN HFA  Inhale 1 puff into the lungs every 6 (six) hours as needed for wheezing or shortness of breath (or coughing).     aspirin 325 MG EC tablet  Take 1 tablet (325 mg total) by mouth daily.     cadexomer iodine 0.9 % gel  Commonly known as:  IODOSORB  Apply 1 application topically daily as needed for wound care.     [employer]S CALCIUM SOFT CHEWS PO  Take 1 tablet by mouth 2 (two) times daily.      cyanocobalamin 500 MCG tablet  Take 500 mcg by mouth daily.     diclofenac sodium 1 % Gel  Commonly known as:  VOLTAREN  Apply 2 g topically 4 (four) times daily.     diltiazem 60 MG tablet  Commonly known as:  CARDIZEM  Take 60 mg by mouth 2 (two) times daily.     folic  acid 1 MG tablet  Commonly known as:  FOLVITE  Take 1 mg by mouth daily.     furosemide 20 MG tablet  Commonly known as:  LASIX  Take 40 mg by mouth daily.     gabapentin 300 MG capsule  Commonly known as:  NEURONTIN  Take 300 mg by mouth 2 (two) times daily.     glimepiride 4 MG tablet  Commonly known as:  AMARYL  Take 2 mg by mouth daily before breakfast.     insulin aspart 100 UNIT/ML injection  Commonly known as:  novoLOG  Inject 0-9 Units into the skin 3 (three) times daily with meals.     insulin glargine 100 UNIT/ML injection  Commonly known as:  LANTUS  Inject 0.1 mLs (10 Units total) into the skin at bedtime.     levothyroxine 50 MCG tablet  Commonly known as:  SYNTHROID, LEVOTHROID  Take 50 mcg by mouth daily with breakfast.     metFORMIN 500 MG tablet  Commonly known as:  GLUCOPHAGE  Take 500 mg by mouth daily.     multivitamin with minerals Tabs tablet  Take 1 tablet by mouth daily.     nitroGLYCERIN 0.2 mg/hr patch  Commonly known as:  NITRODUR - Dosed in mg/24 hr  Place 0.2 mg onto the skin daily. On the foot     nitroGLYCERIN 0.4 MG SL tablet  Commonly known as:  NITROSTAT  Place 1 tablet (0.4 mg total) under the tongue every 5 (five) minutes as needed for chest pain (CP or SOB).     nitroGLYCERIN 2 % ointment  Commonly known as:  NITROGLYN  Apply 0.5 inches topically 2 (two) times daily.     potassium chloride SA 20 MEQ tablet  Commonly known as:  K-DUR,KLOR-CON  Take 20 mEq by mouth 2 (two) times daily.     protein supplement Powd  Take 1 scoop by mouth 3 (three) times daily with meals.     tiotropium 18 MCG inhalation capsule  Commonly known as:  SPIRIVA  Place 18  mcg into inhaler and inhale daily with breakfast.     vitamin C 500 MG tablet  Commonly known as:  ASCORBIC ACID  Take 500 mg by mouth daily.     warfarin 4 MG tablet  Commonly known as:  COUMADIN  Take 2mg  on Wednesday, Friday.  Take 4mg  on Monday, Tuesday, Thursday, Saturday, Sunday.           Follow-up Information   Follow up with ARONSON,RICHARD A, MD. Schedule an appointment as soon as possible for a visit in 2 weeks.   Specialty:  Internal Medicine   Contact information:   [facility] [place] [address] [phone]       Signed: Donnajean Lopes 02/08/2014, 9:45 AM

## 2014-02-23 DIAGNOSIS — L97509 Non-pressure chronic ulcer of other part of unspecified foot with unspecified severity: Secondary | ICD-10-CM | POA: Diagnosis not present

## 2014-02-23 DIAGNOSIS — I70269 Atherosclerosis of native arteries of extremities with gangrene, unspecified extremity: Secondary | ICD-10-CM | POA: Diagnosis not present

## 2014-02-23 DIAGNOSIS — E1149 Type 2 diabetes mellitus with other diabetic neurological complication: Secondary | ICD-10-CM | POA: Diagnosis not present

## 2014-03-13 DIAGNOSIS — R079 Chest pain, unspecified: Secondary | ICD-10-CM | POA: Diagnosis not present

## 2014-03-30 DIAGNOSIS — M069 Rheumatoid arthritis, unspecified: Secondary | ICD-10-CM | POA: Diagnosis not present

## 2014-03-30 DIAGNOSIS — N390 Urinary tract infection, site not specified: Secondary | ICD-10-CM | POA: Diagnosis not present

## 2014-03-30 DIAGNOSIS — I70269 Atherosclerosis of native arteries of extremities with gangrene, unspecified extremity: Secondary | ICD-10-CM | POA: Diagnosis not present

## 2014-03-30 DIAGNOSIS — E1149 Type 2 diabetes mellitus with other diabetic neurological complication: Secondary | ICD-10-CM | POA: Diagnosis not present

## 2014-03-30 DIAGNOSIS — M25519 Pain in unspecified shoulder: Secondary | ICD-10-CM | POA: Diagnosis not present

## 2014-03-30 DIAGNOSIS — L97409 Non-pressure chronic ulcer of unspecified heel and midfoot with unspecified severity: Secondary | ICD-10-CM | POA: Diagnosis not present

## 2014-04-02 ENCOUNTER — Ambulatory Visit (INDEPENDENT_AMBULATORY_CARE_PROVIDER_SITE_OTHER): Payer: Medicare Other | Admitting: Cardiology

## 2014-04-02 ENCOUNTER — Encounter: Payer: Self-pay | Admitting: Cardiology

## 2014-04-02 VITALS — BP 126/82 | HR 65 | Ht 65.0 in | Wt 150.0 lb

## 2014-04-02 DIAGNOSIS — I482 Chronic atrial fibrillation, unspecified: Secondary | ICD-10-CM

## 2014-04-02 DIAGNOSIS — I10 Essential (primary) hypertension: Secondary | ICD-10-CM | POA: Diagnosis not present

## 2014-04-02 DIAGNOSIS — R072 Precordial pain: Secondary | ICD-10-CM | POA: Diagnosis not present

## 2014-04-02 DIAGNOSIS — I4891 Unspecified atrial fibrillation: Secondary | ICD-10-CM

## 2014-04-02 DIAGNOSIS — I5031 Acute diastolic (congestive) heart failure: Secondary | ICD-10-CM

## 2014-04-02 MED ORDER — PANTOPRAZOLE SODIUM 40 MG PO TBEC: 40.0000 mg | DELAYED_RELEASE_TABLET | Freq: Every day | ORAL | Status: DC

## 2014-04-02 NOTE — Patient Instructions (Signed)
Your physician recommends that you schedule a follow-up appointment in: [place]  Your physician recommends that you HAVE LAB WORK TODAY

## 2014-04-02 NOTE — Assessment & Plan Note (Signed)
Plan to continue Cardizem for rate control. Continue Coumadin.

## 2014-04-02 NOTE — Progress Notes (Signed)
HPI: FU permanent atrial fibrillation. Holter monitor in February of 2010 showed A. fib with controlled ventricular response. Myoview in June of 2008 showed normal LV function with an ejection fraction of 63% and normal perfusion. Patient also has peripheral vascular disease. ABIs in January of 2013 showed greater than 50% bilateral SFA stenosis. Echocardiogram in November 2014 showed normal LV function,mild aortic insufficiency and mitral regurgitation and mild right atrial enlargement. Patient admitted in June 2015 with CP and ruled out. Since last seen, She denies dyspnea, palpitations or syncope. She has had 3 separate episodes of epigastric pain lasting 10-15 minutes. She describes this as indigestion. No associated symptoms. Not pleuritic, positional or related to food. She had a recent episode following drinking a carbonated beverage.  Current Outpatient Prescriptions  Medication Sig Dispense Refill  . albuterol (PROVENTIL HFA;VENTOLIN HFA) 108 (90 BASE) MCG/ACT inhaler Inhale 1 puff into the lungs every 6 (six) hours as needed for wheezing or shortness of breath (or coughing).  1 Inhaler  0  . aspirin EC 325 MG EC tablet Take 1 tablet (325 mg total) by mouth daily.  100 tablet  4  . cadexomer iodine (IODOSORB) 0.9 % gel Apply 1 application topically daily as needed for wound care.      . Calcium-Vitamin D-Vitamin K (CVS CALCIUM SOFT CHEWS PO) Take 1 tablet by mouth 2 (two) times daily.       . cyanocobalamin 500 MCG tablet Take 500 mcg by mouth daily.      . diclofenac sodium (VOLTAREN) 1 % GEL Apply 2 g topically 4 (four) times daily.      Marland Kitchen diltiazem (CARDIZEM) 60 MG tablet Take 60 mg by mouth 2 (two) times daily.      . folic acid (FOLVITE) 1 MG tablet Take 1 mg by mouth daily.      . furosemide (LASIX) 20 MG tablet Take 40 mg by mouth daily.      Marland Kitchen gabapentin (NEURONTIN) 300 MG capsule Take 300 mg by mouth 2 (two) times daily.      Marland Kitchen glimepiride (AMARYL) 4 MG tablet Take 2 mg by  mouth daily before breakfast.       . insulin aspart (NOVOLOG) 100 UNIT/ML injection Inject 0-9 Units into the skin 3 (three) times daily with meals.  10 mL  11  . insulin glargine (LANTUS) 100 UNIT/ML injection Inject 0.1 mLs (10 Units total) into the skin at bedtime.  10 mL  11  . levothyroxine (SYNTHROID, LEVOTHROID) 50 MCG tablet Take 50 mcg by mouth daily with breakfast.       . metFORMIN (GLUCOPHAGE) 500 MG tablet Take 500 mg by mouth daily.       . Multiple Vitamin (MULITIVITAMIN WITH MINERALS) TABS Take 1 tablet by mouth daily.      . nitroGLYCERIN (NITRODUR - DOSED IN MG/24 HR) 0.2 mg/hr patch Place 0.2 mg onto the skin daily. On the foot      . nitroGLYCERIN (NITROGLYN) 2 % ointment Apply 0.5 inches topically 2 (two) times daily.  30 g  0  . nitroGLYCERIN (NITROSTAT) 0.4 MG SL tablet Place 1 tablet (0.4 mg total) under the tongue every 5 (five) minutes as needed for chest pain (CP or SOB).  25 tablet  12  . potassium chloride SA (K-DUR,KLOR-CON) 20 MEQ tablet Take 20 mEq by mouth 2 (two) times daily.      . protein supplement (RESOURCE BENEPROTEIN) POWD Take 1 scoop by mouth 3 (three) times  daily with meals.       . tiotropium (SPIRIVA) 18 MCG inhalation capsule Place 18 mcg into inhaler and inhale daily with breakfast.       . vitamin C (ASCORBIC ACID) 500 MG tablet Take 500 mg by mouth daily.      Marland Kitchen warfarin (COUMADIN) 4 MG tablet Take 2mg  on Wednesday, Friday.  Take 4mg  on Monday, Tuesday, Thursday, Saturday, Sunday.  30 tablet  6   No current facility-administered medications for this visit.     Past Medical History  Diagnosis Date  . Lung disease     BRONCHOSPASTIC  . Neuropathy     takes Gabapentin bid  . Hypercholesterolemia     doesn't require meds for this  . Anemia   . COPD (chronic obstructive pulmonary disease)   . Blood transfusion ~ 2012    "once; not related to a surgery or bleeding that I remember" (06/06/2013)  . Hypertension     takes Prinizide daily  .  Chronic atrial fibrillation     takes Coumadin as instructed  . Peripheral vascular disease   . CHF (congestive heart failure)     takes Lasix every other day  . Emphysema   . History of head injury     many yrs ago  . Vaginal discharge     has seen GYN but says its nothing to worry about  . Diabetes mellitus     takes Metformin and Amaryl daily  . Hypothyroidism     takes Synthroid daily  . Macular degeneration     dry  . Bronchial pneumonia     "once; several years ago" (06/06/2013)  . Rheumatoid arthritis     "shoulders; daughter gives me shots of Methotrexate q week" (06/06/2013)    Past Surgical History  Procedure Laterality Date  . Total hip arthroplasty Left ?2013  . Back surgery    . Fracture surgery  2011    LEFT HIP FX /REPLACEMENT   . Cholecystectomy  1951  . Dilation and curettage of uterus    . Amputation  11/09/2011    Procedure: AMPUTATION DIGIT;  Surgeon: Mcarthur Rossetti, MD;  Location: [facility];  Service: Orthopedics;  Laterality: Right;  Right Great Toe Amputation  . Amputation  01/05/2012    Procedure: AMPUTATION RAY;  Surgeon: Mcarthur Rossetti, MD;  Location: [facility];  Service: Orthopedics;  Laterality: Left;  Amputation Left 4th and 5th Toes  . Cardioversion    . Amputation  05/31/2012    Procedure: AMPUTATION RAY;  Surgeon: Newt Minion, MD;  Location: [facility];  Service: Orthopedics;  Laterality: Left;  Left foot 1st ray amp  . Amputation  06/21/2012    Procedure: AMPUTATION BELOW KNEE;  Surgeon: Newt Minion, MD;  Location: [facility];  Service: Orthopedics;  Laterality: Left;  Left Below Knee Amputation  . Foot amputation through metatarsal Right 06/06/2013    midfoot/notes 06/06/2013  . Joint replacement    . Appendectomy    . Total abdominal hysterectomy  1975  . Cervical disc surgery      "the top vertebrae" (06/06/2013)  . Cataract extraction w/ intraocular lens  implant, bilateral Bilateral   . Eye surgery      bilateral -cataract surgery    . Amputation Right 06/06/2013    Procedure: RIGHT MIDFOOT AMPUTATION;  Surgeon: Newt Minion, MD;  Location: [facility];  Service: Orthopedics;  Laterality: Right;    History   Social History  . Marital  Status: Widowed    Spouse Name: N/A    Number of Children: N/A  . Years of Education: N/A   Occupational History  . Not on file.   Social History Main Topics  . Smoking status: Former Smoker -- 4 years    Types: Cigarettes  . Smokeless tobacco: Never Used     Comment: 06/06/2013 "smoked a little in my teens"  . Alcohol Use: No  . Drug Use: No  . Sexual Activity: No   Other Topics Concern  . Not on file   Social History Narrative  . No narrative on file    ROS: no fevers or chills, productive cough, hemoptysis, dysphasia, odynophagia, melena, hematochezia, dysuria, hematuria, rash, seizure activity, orthopnea, PND, pedal edema, claudication. Remaining systems are negative.  Physical Exam: Well-developed well-nourished in no acute distress.  Skin is warm and dry.  HEENT is normal.  Neck is supple.  Chest is clear to auscultation with normal expansion.  Cardiovascular exam is irregular Abdominal exam nontender or distended. No masses palpated. Extremities Status post lower extremity amputation neuro grossly intact  ECG 02/07/2014-atrial fibrillation, no ST changes.    Electrocardiogram today shows atrial fibrillation, right axis deviation, no significant ST changes.

## 2014-04-02 NOTE — Assessment & Plan Note (Signed)
Euvolemic on examination. Continue present dose of Lasix. Check potassium and renal function. 

## 2014-04-02 NOTE — Assessment & Plan Note (Signed)
Blood pressure controlled. Continue present medications. 

## 2014-04-02 NOTE — Assessment & Plan Note (Addendum)
Etiology of patient's symptoms unclear. Cannot rule out cardiac source. Possible indigestion. I discussed further evaluation with patient. We discussed stress test and if abnormal cardiac catheterization. She wants only conservative measures given age. She understands the risk of undiagnosed coronary disease. We will plan medical therapy. Note electrocardiogram shows no significant ST changes. I will add a protonix 40 mg daily to see if this helps with symptoms.

## 2014-04-03 LAB — BASIC METABOLIC PANEL WITH GFR
BUN: 42 mg/dL — AB (ref 6–23)
CO2: 27 mEq/L (ref 19–32)
Calcium: 9 mg/dL (ref 8.4–10.5)
Chloride: 101 mEq/L (ref 96–112)
Creat: 1.28 mg/dL — ABNORMAL HIGH (ref 0.50–1.10)
GFR, EST AFRICAN AMERICAN: 42 mL/min — AB
GFR, EST NON AFRICAN AMERICAN: 37 mL/min — AB
Glucose, Bld: 175 mg/dL — ABNORMAL HIGH (ref 70–99)
Potassium: 4.5 mEq/L (ref 3.5–5.3)
Sodium: 139 mEq/L (ref 135–145)

## 2014-04-08 ENCOUNTER — Telehealth: Payer: Self-pay | Admitting: Cardiology

## 2014-04-08 NOTE — Telephone Encounter (Signed)
Please call,need to give you some information that you might need.

## 2014-04-08 NOTE — Telephone Encounter (Signed)
Spoke with pt dtr, she has a copy of the EKG done by EMS before they took her to the hosp. She is going to drop it by so it can be put in her record.

## 2014-04-15 ENCOUNTER — Encounter: Payer: Self-pay | Admitting: Cardiology

## 2014-04-16 DIAGNOSIS — I739 Peripheral vascular disease, unspecified: Secondary | ICD-10-CM | POA: Diagnosis not present

## 2014-04-16 DIAGNOSIS — S88119A Complete traumatic amputation at level between knee and ankle, unspecified lower leg, initial encounter: Secondary | ICD-10-CM | POA: Diagnosis not present

## 2014-04-16 DIAGNOSIS — Z7901 Long term (current) use of anticoagulants: Secondary | ICD-10-CM | POA: Diagnosis not present

## 2014-04-16 DIAGNOSIS — J449 Chronic obstructive pulmonary disease, unspecified: Secondary | ICD-10-CM | POA: Diagnosis not present

## 2014-04-16 DIAGNOSIS — M48062 Spinal stenosis, lumbar region with neurogenic claudication: Secondary | ICD-10-CM | POA: Diagnosis not present

## 2014-04-16 DIAGNOSIS — E1159 Type 2 diabetes mellitus with other circulatory complications: Secondary | ICD-10-CM | POA: Diagnosis not present

## 2014-04-16 DIAGNOSIS — I509 Heart failure, unspecified: Secondary | ICD-10-CM | POA: Diagnosis not present

## 2014-04-16 DIAGNOSIS — Z23 Encounter for immunization: Secondary | ICD-10-CM | POA: Diagnosis not present

## 2014-04-16 DIAGNOSIS — I10 Essential (primary) hypertension: Secondary | ICD-10-CM | POA: Diagnosis not present

## 2014-04-17 DIAGNOSIS — L97409 Non-pressure chronic ulcer of unspecified heel and midfoot with unspecified severity: Secondary | ICD-10-CM | POA: Diagnosis not present

## 2014-04-17 DIAGNOSIS — E1149 Type 2 diabetes mellitus with other diabetic neurological complication: Secondary | ICD-10-CM | POA: Diagnosis not present

## 2014-04-17 DIAGNOSIS — I70269 Atherosclerosis of native arteries of extremities with gangrene, unspecified extremity: Secondary | ICD-10-CM | POA: Diagnosis not present

## 2014-05-04 DIAGNOSIS — D6489 Other specified anemias: Secondary | ICD-10-CM | POA: Diagnosis not present

## 2014-05-04 DIAGNOSIS — D6859 Other primary thrombophilia: Secondary | ICD-10-CM | POA: Diagnosis not present

## 2014-05-04 DIAGNOSIS — R791 Abnormal coagulation profile: Secondary | ICD-10-CM | POA: Diagnosis not present

## 2014-05-18 DIAGNOSIS — I70262 Atherosclerosis of native arteries of extremities with gangrene, left leg: Secondary | ICD-10-CM | POA: Diagnosis not present

## 2014-05-18 DIAGNOSIS — L97421 Non-pressure chronic ulcer of left heel and midfoot limited to breakdown of skin: Secondary | ICD-10-CM | POA: Diagnosis not present

## 2014-05-18 DIAGNOSIS — E1142 Type 2 diabetes mellitus with diabetic polyneuropathy: Secondary | ICD-10-CM | POA: Diagnosis not present

## 2014-05-27 DIAGNOSIS — I129 Hypertensive chronic kidney disease with stage 1 through stage 4 chronic kidney disease, or unspecified chronic kidney disease: Secondary | ICD-10-CM | POA: Diagnosis not present

## 2014-05-27 DIAGNOSIS — E119 Type 2 diabetes mellitus without complications: Secondary | ICD-10-CM | POA: Diagnosis not present

## 2014-05-27 DIAGNOSIS — G609 Hereditary and idiopathic neuropathy, unspecified: Secondary | ICD-10-CM | POA: Diagnosis not present

## 2014-05-27 DIAGNOSIS — L89619 Pressure ulcer of right heel, unspecified stage: Secondary | ICD-10-CM | POA: Diagnosis not present

## 2014-05-27 DIAGNOSIS — I739 Peripheral vascular disease, unspecified: Secondary | ICD-10-CM | POA: Diagnosis not present

## 2014-05-27 DIAGNOSIS — Z4781 Encounter for orthopedic aftercare following surgical amputation: Secondary | ICD-10-CM | POA: Diagnosis not present

## 2014-05-28 DIAGNOSIS — G609 Hereditary and idiopathic neuropathy, unspecified: Secondary | ICD-10-CM | POA: Diagnosis not present

## 2014-05-28 DIAGNOSIS — Z4781 Encounter for orthopedic aftercare following surgical amputation: Secondary | ICD-10-CM | POA: Diagnosis not present

## 2014-05-28 DIAGNOSIS — I739 Peripheral vascular disease, unspecified: Secondary | ICD-10-CM | POA: Diagnosis not present

## 2014-05-28 DIAGNOSIS — I129 Hypertensive chronic kidney disease with stage 1 through stage 4 chronic kidney disease, or unspecified chronic kidney disease: Secondary | ICD-10-CM | POA: Diagnosis not present

## 2014-05-28 DIAGNOSIS — L89619 Pressure ulcer of right heel, unspecified stage: Secondary | ICD-10-CM | POA: Diagnosis not present

## 2014-05-28 DIAGNOSIS — E119 Type 2 diabetes mellitus without complications: Secondary | ICD-10-CM | POA: Diagnosis not present

## 2014-06-01 DIAGNOSIS — E119 Type 2 diabetes mellitus without complications: Secondary | ICD-10-CM | POA: Diagnosis not present

## 2014-06-01 DIAGNOSIS — G609 Hereditary and idiopathic neuropathy, unspecified: Secondary | ICD-10-CM | POA: Diagnosis not present

## 2014-06-01 DIAGNOSIS — L89619 Pressure ulcer of right heel, unspecified stage: Secondary | ICD-10-CM | POA: Diagnosis not present

## 2014-06-01 DIAGNOSIS — Z4781 Encounter for orthopedic aftercare following surgical amputation: Secondary | ICD-10-CM | POA: Diagnosis not present

## 2014-06-01 DIAGNOSIS — I129 Hypertensive chronic kidney disease with stage 1 through stage 4 chronic kidney disease, or unspecified chronic kidney disease: Secondary | ICD-10-CM | POA: Diagnosis not present

## 2014-06-01 DIAGNOSIS — I739 Peripheral vascular disease, unspecified: Secondary | ICD-10-CM | POA: Diagnosis not present

## 2014-06-02 DIAGNOSIS — G609 Hereditary and idiopathic neuropathy, unspecified: Secondary | ICD-10-CM | POA: Diagnosis not present

## 2014-06-02 DIAGNOSIS — I739 Peripheral vascular disease, unspecified: Secondary | ICD-10-CM | POA: Diagnosis not present

## 2014-06-02 DIAGNOSIS — E119 Type 2 diabetes mellitus without complications: Secondary | ICD-10-CM | POA: Diagnosis not present

## 2014-06-02 DIAGNOSIS — Z4781 Encounter for orthopedic aftercare following surgical amputation: Secondary | ICD-10-CM | POA: Diagnosis not present

## 2014-06-02 DIAGNOSIS — L89619 Pressure ulcer of right heel, unspecified stage: Secondary | ICD-10-CM | POA: Diagnosis not present

## 2014-06-02 DIAGNOSIS — I129 Hypertensive chronic kidney disease with stage 1 through stage 4 chronic kidney disease, or unspecified chronic kidney disease: Secondary | ICD-10-CM | POA: Diagnosis not present

## 2014-06-04 DIAGNOSIS — L89619 Pressure ulcer of right heel, unspecified stage: Secondary | ICD-10-CM | POA: Diagnosis not present

## 2014-06-04 DIAGNOSIS — I129 Hypertensive chronic kidney disease with stage 1 through stage 4 chronic kidney disease, or unspecified chronic kidney disease: Secondary | ICD-10-CM | POA: Diagnosis not present

## 2014-06-04 DIAGNOSIS — E119 Type 2 diabetes mellitus without complications: Secondary | ICD-10-CM | POA: Diagnosis not present

## 2014-06-04 DIAGNOSIS — I739 Peripheral vascular disease, unspecified: Secondary | ICD-10-CM | POA: Diagnosis not present

## 2014-06-04 DIAGNOSIS — G609 Hereditary and idiopathic neuropathy, unspecified: Secondary | ICD-10-CM | POA: Diagnosis not present

## 2014-06-04 DIAGNOSIS — Z4781 Encounter for orthopedic aftercare following surgical amputation: Secondary | ICD-10-CM | POA: Diagnosis not present

## 2014-06-08 DIAGNOSIS — G609 Hereditary and idiopathic neuropathy, unspecified: Secondary | ICD-10-CM | POA: Diagnosis not present

## 2014-06-08 DIAGNOSIS — Z4781 Encounter for orthopedic aftercare following surgical amputation: Secondary | ICD-10-CM | POA: Diagnosis not present

## 2014-06-08 DIAGNOSIS — L89619 Pressure ulcer of right heel, unspecified stage: Secondary | ICD-10-CM | POA: Diagnosis not present

## 2014-06-08 DIAGNOSIS — E119 Type 2 diabetes mellitus without complications: Secondary | ICD-10-CM | POA: Diagnosis not present

## 2014-06-08 DIAGNOSIS — I739 Peripheral vascular disease, unspecified: Secondary | ICD-10-CM | POA: Diagnosis not present

## 2014-06-08 DIAGNOSIS — I129 Hypertensive chronic kidney disease with stage 1 through stage 4 chronic kidney disease, or unspecified chronic kidney disease: Secondary | ICD-10-CM | POA: Diagnosis not present

## 2014-06-11 DIAGNOSIS — I129 Hypertensive chronic kidney disease with stage 1 through stage 4 chronic kidney disease, or unspecified chronic kidney disease: Secondary | ICD-10-CM | POA: Diagnosis not present

## 2014-06-11 DIAGNOSIS — E119 Type 2 diabetes mellitus without complications: Secondary | ICD-10-CM | POA: Diagnosis not present

## 2014-06-11 DIAGNOSIS — G609 Hereditary and idiopathic neuropathy, unspecified: Secondary | ICD-10-CM | POA: Diagnosis not present

## 2014-06-11 DIAGNOSIS — I739 Peripheral vascular disease, unspecified: Secondary | ICD-10-CM | POA: Diagnosis not present

## 2014-06-11 DIAGNOSIS — Z4781 Encounter for orthopedic aftercare following surgical amputation: Secondary | ICD-10-CM | POA: Diagnosis not present

## 2014-06-11 DIAGNOSIS — L89619 Pressure ulcer of right heel, unspecified stage: Secondary | ICD-10-CM | POA: Diagnosis not present

## 2014-06-12 DIAGNOSIS — I739 Peripheral vascular disease, unspecified: Secondary | ICD-10-CM | POA: Diagnosis not present

## 2014-06-12 DIAGNOSIS — I129 Hypertensive chronic kidney disease with stage 1 through stage 4 chronic kidney disease, or unspecified chronic kidney disease: Secondary | ICD-10-CM | POA: Diagnosis not present

## 2014-06-12 DIAGNOSIS — E119 Type 2 diabetes mellitus without complications: Secondary | ICD-10-CM | POA: Diagnosis not present

## 2014-06-12 DIAGNOSIS — Z4781 Encounter for orthopedic aftercare following surgical amputation: Secondary | ICD-10-CM | POA: Diagnosis not present

## 2014-06-12 DIAGNOSIS — L89619 Pressure ulcer of right heel, unspecified stage: Secondary | ICD-10-CM | POA: Diagnosis not present

## 2014-06-12 DIAGNOSIS — G609 Hereditary and idiopathic neuropathy, unspecified: Secondary | ICD-10-CM | POA: Diagnosis not present

## 2014-06-15 DIAGNOSIS — E1142 Type 2 diabetes mellitus with diabetic polyneuropathy: Secondary | ICD-10-CM | POA: Diagnosis not present

## 2014-06-15 DIAGNOSIS — I70262 Atherosclerosis of native arteries of extremities with gangrene, left leg: Secondary | ICD-10-CM | POA: Diagnosis not present

## 2014-06-15 DIAGNOSIS — L97421 Non-pressure chronic ulcer of left heel and midfoot limited to breakdown of skin: Secondary | ICD-10-CM | POA: Diagnosis not present

## 2014-06-16 DIAGNOSIS — I129 Hypertensive chronic kidney disease with stage 1 through stage 4 chronic kidney disease, or unspecified chronic kidney disease: Secondary | ICD-10-CM | POA: Diagnosis not present

## 2014-06-16 DIAGNOSIS — E119 Type 2 diabetes mellitus without complications: Secondary | ICD-10-CM | POA: Diagnosis not present

## 2014-06-16 DIAGNOSIS — L89619 Pressure ulcer of right heel, unspecified stage: Secondary | ICD-10-CM | POA: Diagnosis not present

## 2014-06-16 DIAGNOSIS — I739 Peripheral vascular disease, unspecified: Secondary | ICD-10-CM | POA: Diagnosis not present

## 2014-06-16 DIAGNOSIS — Z4781 Encounter for orthopedic aftercare following surgical amputation: Secondary | ICD-10-CM | POA: Diagnosis not present

## 2014-06-16 DIAGNOSIS — G609 Hereditary and idiopathic neuropathy, unspecified: Secondary | ICD-10-CM | POA: Diagnosis not present

## 2014-06-18 DIAGNOSIS — I129 Hypertensive chronic kidney disease with stage 1 through stage 4 chronic kidney disease, or unspecified chronic kidney disease: Secondary | ICD-10-CM | POA: Diagnosis not present

## 2014-06-18 DIAGNOSIS — L89619 Pressure ulcer of right heel, unspecified stage: Secondary | ICD-10-CM | POA: Diagnosis not present

## 2014-06-18 DIAGNOSIS — G609 Hereditary and idiopathic neuropathy, unspecified: Secondary | ICD-10-CM | POA: Diagnosis not present

## 2014-06-18 DIAGNOSIS — I739 Peripheral vascular disease, unspecified: Secondary | ICD-10-CM | POA: Diagnosis not present

## 2014-06-18 DIAGNOSIS — E119 Type 2 diabetes mellitus without complications: Secondary | ICD-10-CM | POA: Diagnosis not present

## 2014-06-18 DIAGNOSIS — Z4781 Encounter for orthopedic aftercare following surgical amputation: Secondary | ICD-10-CM | POA: Diagnosis not present

## 2014-06-22 DIAGNOSIS — I739 Peripheral vascular disease, unspecified: Secondary | ICD-10-CM | POA: Diagnosis not present

## 2014-06-22 DIAGNOSIS — L89619 Pressure ulcer of right heel, unspecified stage: Secondary | ICD-10-CM | POA: Diagnosis not present

## 2014-06-22 DIAGNOSIS — G609 Hereditary and idiopathic neuropathy, unspecified: Secondary | ICD-10-CM | POA: Diagnosis not present

## 2014-06-22 DIAGNOSIS — E119 Type 2 diabetes mellitus without complications: Secondary | ICD-10-CM | POA: Diagnosis not present

## 2014-06-22 DIAGNOSIS — Z4781 Encounter for orthopedic aftercare following surgical amputation: Secondary | ICD-10-CM | POA: Diagnosis not present

## 2014-06-22 DIAGNOSIS — I129 Hypertensive chronic kidney disease with stage 1 through stage 4 chronic kidney disease, or unspecified chronic kidney disease: Secondary | ICD-10-CM | POA: Diagnosis not present

## 2014-06-25 DIAGNOSIS — L89619 Pressure ulcer of right heel, unspecified stage: Secondary | ICD-10-CM | POA: Diagnosis not present

## 2014-06-25 DIAGNOSIS — Z4781 Encounter for orthopedic aftercare following surgical amputation: Secondary | ICD-10-CM | POA: Diagnosis not present

## 2014-06-25 DIAGNOSIS — I129 Hypertensive chronic kidney disease with stage 1 through stage 4 chronic kidney disease, or unspecified chronic kidney disease: Secondary | ICD-10-CM | POA: Diagnosis not present

## 2014-06-25 DIAGNOSIS — E119 Type 2 diabetes mellitus without complications: Secondary | ICD-10-CM | POA: Diagnosis not present

## 2014-06-25 DIAGNOSIS — G609 Hereditary and idiopathic neuropathy, unspecified: Secondary | ICD-10-CM | POA: Diagnosis not present

## 2014-06-25 DIAGNOSIS — I739 Peripheral vascular disease, unspecified: Secondary | ICD-10-CM | POA: Diagnosis not present

## 2014-06-29 NOTE — Progress Notes (Signed)
HPI: FU permanent atrial fibrillation. Myoview in June of 2008 showed normal LV function with an ejection fraction of 63% and normal perfusion. Holter monitor in February of 2010 showed A. fib with controlled ventricular response. Patient also has peripheral vascular disease. ABIs in January of 2013 showed greater than 50% bilateral SFA stenosis. Echocardiogram in November 2014 showed normal LV function, mild aortic insufficiency and mitral regurgitation and mild right atrial enlargement. Patient admitted in June 2015 with CP and ruled out. At last office visit we discussed further cardiac evaluation and she requested conservative measures only. I did add Protonix. Since she was last seen, She denies dyspnea, chest pain, palpitations or syncope.  Current Outpatient Prescriptions  Medication Sig Dispense Refill  . albuterol (PROVENTIL HFA;VENTOLIN HFA) 108 (90 BASE) MCG/ACT inhaler Inhale 1 puff into the lungs every 6 (six) hours as needed for wheezing or shortness of breath (or coughing). 1 Inhaler 0  . cadexomer iodine (IODOSORB) 0.9 % gel Apply 1 application topically daily as needed for wound care.    . Calcium-Vitamin D-Vitamin K (CVS CALCIUM SOFT CHEWS PO) Take 1 tablet by mouth 2 (two) times daily.     . cyanocobalamin 500 MCG tablet Take 500 mcg by mouth daily.    . diclofenac sodium (VOLTAREN) 1 % GEL Apply 2 g topically 4 (four) times daily.    Marland Kitchen diltiazem (CARDIZEM) 60 MG tablet Take 60 mg by mouth 2 (two) times daily.    . folic acid (FOLVITE) 1 MG tablet Take 1 mg by mouth daily.    . furosemide (LASIX) 20 MG tablet Take 40 mg by mouth daily.    Marland Kitchen gabapentin (NEURONTIN) 300 MG capsule Take 300 mg by mouth 2 (two) times daily.    . insulin aspart (NOVOLOG) 100 UNIT/ML injection Inject 0-9 Units into the skin 3 (three) times daily with meals. 10 mL 11  . levothyroxine (SYNTHROID, LEVOTHROID) 50 MCG tablet Take 50 mcg by mouth daily with breakfast.     . metFORMIN (GLUCOPHAGE) 500  MG tablet Take 500 mg by mouth daily.     . Multiple Vitamin (MULITIVITAMIN WITH MINERALS) TABS Take 1 tablet by mouth daily.    . nitroGLYCERIN (NITRODUR - DOSED IN MG/24 HR) 0.2 mg/hr patch Place 0.2 mg onto the skin daily. On the foot    . nitroGLYCERIN (NITROGLYN) 2 % ointment Apply 0.5 inches topically 2 (two) times daily. 30 g 0  . nitroGLYCERIN (NITROSTAT) 0.4 MG SL tablet Place 1 tablet (0.4 mg total) under the tongue every 5 (five) minutes as needed for chest pain (CP or SOB). 25 tablet 12  . pantoprazole (PROTONIX) 40 MG tablet Take 1 tablet (40 mg total) by mouth daily. 30 tablet 11  . potassium chloride SA (K-DUR,KLOR-CON) 20 MEQ tablet Take 20 mEq by mouth 2 (two) times daily.    . protein supplement (RESOURCE BENEPROTEIN) POWD Take 1 scoop by mouth 3 (three) times daily with meals.     . tiotropium (SPIRIVA) 18 MCG inhalation capsule Place 18 mcg into inhaler and inhale daily with breakfast.     . vitamin C (ASCORBIC ACID) 500 MG tablet Take 500 mg by mouth daily.    Marland Kitchen warfarin (COUMADIN) 2 MG tablet Take 2 mg by mouth daily. Tuesday, Thursday, Saturday, Sunday.    . warfarin (COUMADIN) 3 MG tablet Take 3 mg by mouth daily. Take Monday , Wednesday Friday     No current facility-administered medications for this visit.  Past Medical History  Diagnosis Date  . Lung disease     BRONCHOSPASTIC  . Neuropathy     takes Gabapentin bid  . Hypercholesterolemia     doesn't require meds for this  . Anemia   . COPD (chronic obstructive pulmonary disease)   . Blood transfusion ~ 2012    "once; not related to a surgery or bleeding that I remember" (06/06/2013)  . Hypertension     takes Prinizide daily  . Chronic atrial fibrillation     takes Coumadin as instructed  . Peripheral vascular disease   . CHF (congestive heart failure)     takes Lasix every other day  . Emphysema   . History of head injury     many yrs ago  . Vaginal discharge     has seen GYN but says its nothing  to worry about  . Diabetes mellitus     takes Metformin and Amaryl daily  . Hypothyroidism     takes Synthroid daily  . Macular degeneration     dry  . Bronchial pneumonia     "once; several years ago" (06/06/2013)  . Rheumatoid arthritis     "shoulders; daughter gives me shots of Methotrexate q week" (06/06/2013)    Past Surgical History  Procedure Laterality Date  . Total hip arthroplasty Left ?2013  . Back surgery    . Fracture surgery  2011    LEFT HIP FX /REPLACEMENT   . Cholecystectomy  1951  . Dilation and curettage of uterus    . Amputation  11/09/2011    Procedure: AMPUTATION DIGIT;  Surgeon: Mcarthur Rossetti, MD;  Location: [facility];  Service: Orthopedics;  Laterality: Right;  Right Great Toe Amputation  . Amputation  01/05/2012    Procedure: AMPUTATION RAY;  Surgeon: Mcarthur Rossetti, MD;  Location: [facility];  Service: Orthopedics;  Laterality: Left;  Amputation Left 4th and 5th Toes  . Cardioversion    . Amputation  05/31/2012    Procedure: AMPUTATION RAY;  Surgeon: Newt Minion, MD;  Location: [facility];  Service: Orthopedics;  Laterality: Left;  Left foot 1st ray amp  . Amputation  06/21/2012    Procedure: AMPUTATION BELOW KNEE;  Surgeon: Newt Minion, MD;  Location: [facility];  Service: Orthopedics;  Laterality: Left;  Left Below Knee Amputation  . Foot amputation through metatarsal Right 06/06/2013    midfoot/notes 06/06/2013  . Joint replacement    . Appendectomy    . Total abdominal hysterectomy  1975  . Cervical disc surgery      "the top vertebrae" (06/06/2013)  . Cataract extraction w/ intraocular lens  implant, bilateral Bilateral   . Eye surgery      bilateral -cataract surgery  . Amputation Right 06/06/2013    Procedure: RIGHT MIDFOOT AMPUTATION;  Surgeon: Newt Minion, MD;  Location: [facility];  Service: Orthopedics;  Laterality: Right;    History   Social History  . Marital Status: Widowed    Spouse Name: N/A    Number of Children: N/A  . Years  of Education: N/A   Occupational History  . Not on file.   Social History Main Topics  . Smoking status: Former Smoker -- 4 years    Types: Cigarettes  . Smokeless tobacco: Never Used     Comment: 06/06/2013 "smoked a little in my teens"  . Alcohol Use: No  . Drug Use: No  . Sexual Activity: No   Other Topics Concern  .  Not on file   Social History Narrative    ROS: no fevers or chills, productive cough, hemoptysis, dysphasia, odynophagia, melena, hematochezia, dysuria, hematuria, rash, seizure activity, orthopnea, PND, pedal edema, claudication. Remaining systems are negative.  Physical Exam: Well-developed well-nourished in no acute distress.  Skin is warm and dry.  HEENT is normal.  Neck is supple.  Chest is clear to auscultation with normal expansion.  Cardiovascular exam is irregular Abdominal exam nontender or distended. No masses palpated. Extremities Previous amputation. neuro grossly intact

## 2014-07-01 DIAGNOSIS — I129 Hypertensive chronic kidney disease with stage 1 through stage 4 chronic kidney disease, or unspecified chronic kidney disease: Secondary | ICD-10-CM | POA: Diagnosis not present

## 2014-07-01 DIAGNOSIS — Z4781 Encounter for orthopedic aftercare following surgical amputation: Secondary | ICD-10-CM | POA: Diagnosis not present

## 2014-07-01 DIAGNOSIS — G609 Hereditary and idiopathic neuropathy, unspecified: Secondary | ICD-10-CM | POA: Diagnosis not present

## 2014-07-01 DIAGNOSIS — L89619 Pressure ulcer of right heel, unspecified stage: Secondary | ICD-10-CM | POA: Diagnosis not present

## 2014-07-01 DIAGNOSIS — E119 Type 2 diabetes mellitus without complications: Secondary | ICD-10-CM | POA: Diagnosis not present

## 2014-07-01 DIAGNOSIS — I739 Peripheral vascular disease, unspecified: Secondary | ICD-10-CM | POA: Diagnosis not present

## 2014-07-02 DIAGNOSIS — M25512 Pain in left shoulder: Secondary | ICD-10-CM | POA: Diagnosis not present

## 2014-07-02 DIAGNOSIS — M069 Rheumatoid arthritis, unspecified: Secondary | ICD-10-CM | POA: Diagnosis not present

## 2014-07-02 DIAGNOSIS — M25511 Pain in right shoulder: Secondary | ICD-10-CM | POA: Diagnosis not present

## 2014-07-03 ENCOUNTER — Ambulatory Visit (INDEPENDENT_AMBULATORY_CARE_PROVIDER_SITE_OTHER): Payer: Medicare Other | Admitting: Cardiology

## 2014-07-03 ENCOUNTER — Encounter: Payer: Self-pay | Admitting: Cardiology

## 2014-07-03 VITALS — BP 140/60 | HR 70 | Ht 64.0 in | Wt 152.0 lb

## 2014-07-03 DIAGNOSIS — I10 Essential (primary) hypertension: Secondary | ICD-10-CM | POA: Diagnosis not present

## 2014-07-03 DIAGNOSIS — R072 Precordial pain: Secondary | ICD-10-CM | POA: Diagnosis not present

## 2014-07-03 DIAGNOSIS — G609 Hereditary and idiopathic neuropathy, unspecified: Secondary | ICD-10-CM | POA: Diagnosis not present

## 2014-07-03 DIAGNOSIS — Z4781 Encounter for orthopedic aftercare following surgical amputation: Secondary | ICD-10-CM | POA: Diagnosis not present

## 2014-07-03 DIAGNOSIS — I5031 Acute diastolic (congestive) heart failure: Secondary | ICD-10-CM

## 2014-07-03 DIAGNOSIS — I482 Chronic atrial fibrillation, unspecified: Secondary | ICD-10-CM

## 2014-07-03 DIAGNOSIS — E119 Type 2 diabetes mellitus without complications: Secondary | ICD-10-CM | POA: Diagnosis not present

## 2014-07-03 DIAGNOSIS — I739 Peripheral vascular disease, unspecified: Secondary | ICD-10-CM | POA: Diagnosis not present

## 2014-07-03 DIAGNOSIS — L89619 Pressure ulcer of right heel, unspecified stage: Secondary | ICD-10-CM | POA: Diagnosis not present

## 2014-07-03 DIAGNOSIS — I129 Hypertensive chronic kidney disease with stage 1 through stage 4 chronic kidney disease, or unspecified chronic kidney disease: Secondary | ICD-10-CM | POA: Diagnosis not present

## 2014-07-03 NOTE — Assessment & Plan Note (Signed)
Heart rate is controlled. Continue Cardizem. Continue Coumadin.

## 2014-07-03 NOTE — Assessment & Plan Note (Signed)
Management per primary care. 

## 2014-07-03 NOTE — Patient Instructions (Signed)
Your physician wants you to follow-up in: 6 MONTHS WITH DR CRENSHAW You will receive a reminder letter in the mail two months in advance. If you don't receive a letter, please call our office to schedule the follow-up appointment.  

## 2014-07-03 NOTE — Assessment & Plan Note (Signed)
Blood pressure controlled. Continue present medications. 

## 2014-07-03 NOTE — Assessment & Plan Note (Signed)
Patient symptoms have improved with Protonix. Will continue.She does not want further cardiac workup.

## 2014-07-03 NOTE — Assessment & Plan Note (Signed)
Continue present dose of Lasix. Check potassium and renal function. 

## 2014-07-07 DIAGNOSIS — I129 Hypertensive chronic kidney disease with stage 1 through stage 4 chronic kidney disease, or unspecified chronic kidney disease: Secondary | ICD-10-CM | POA: Diagnosis not present

## 2014-07-07 DIAGNOSIS — I739 Peripheral vascular disease, unspecified: Secondary | ICD-10-CM | POA: Diagnosis not present

## 2014-07-07 DIAGNOSIS — G609 Hereditary and idiopathic neuropathy, unspecified: Secondary | ICD-10-CM | POA: Diagnosis not present

## 2014-07-07 DIAGNOSIS — Z4781 Encounter for orthopedic aftercare following surgical amputation: Secondary | ICD-10-CM | POA: Diagnosis not present

## 2014-07-07 DIAGNOSIS — L89619 Pressure ulcer of right heel, unspecified stage: Secondary | ICD-10-CM | POA: Diagnosis not present

## 2014-07-07 DIAGNOSIS — E119 Type 2 diabetes mellitus without complications: Secondary | ICD-10-CM | POA: Diagnosis not present

## 2014-07-10 DIAGNOSIS — I129 Hypertensive chronic kidney disease with stage 1 through stage 4 chronic kidney disease, or unspecified chronic kidney disease: Secondary | ICD-10-CM | POA: Diagnosis not present

## 2014-07-10 DIAGNOSIS — G609 Hereditary and idiopathic neuropathy, unspecified: Secondary | ICD-10-CM | POA: Diagnosis not present

## 2014-07-10 DIAGNOSIS — L89619 Pressure ulcer of right heel, unspecified stage: Secondary | ICD-10-CM | POA: Diagnosis not present

## 2014-07-10 DIAGNOSIS — E119 Type 2 diabetes mellitus without complications: Secondary | ICD-10-CM | POA: Diagnosis not present

## 2014-07-10 DIAGNOSIS — Z4781 Encounter for orthopedic aftercare following surgical amputation: Secondary | ICD-10-CM | POA: Diagnosis not present

## 2014-07-10 DIAGNOSIS — I739 Peripheral vascular disease, unspecified: Secondary | ICD-10-CM | POA: Diagnosis not present

## 2014-07-13 DIAGNOSIS — E1142 Type 2 diabetes mellitus with diabetic polyneuropathy: Secondary | ICD-10-CM | POA: Diagnosis not present

## 2014-07-13 DIAGNOSIS — L97421 Non-pressure chronic ulcer of left heel and midfoot limited to breakdown of skin: Secondary | ICD-10-CM | POA: Diagnosis not present

## 2014-07-13 DIAGNOSIS — I70262 Atherosclerosis of native arteries of extremities with gangrene, left leg: Secondary | ICD-10-CM | POA: Diagnosis not present

## 2014-07-14 ENCOUNTER — Encounter: Payer: Self-pay | Admitting: Cardiology

## 2014-07-14 DIAGNOSIS — G609 Hereditary and idiopathic neuropathy, unspecified: Secondary | ICD-10-CM | POA: Diagnosis not present

## 2014-07-14 DIAGNOSIS — L89619 Pressure ulcer of right heel, unspecified stage: Secondary | ICD-10-CM | POA: Diagnosis not present

## 2014-07-14 DIAGNOSIS — I129 Hypertensive chronic kidney disease with stage 1 through stage 4 chronic kidney disease, or unspecified chronic kidney disease: Secondary | ICD-10-CM | POA: Diagnosis not present

## 2014-07-14 DIAGNOSIS — I739 Peripheral vascular disease, unspecified: Secondary | ICD-10-CM | POA: Diagnosis not present

## 2014-07-14 DIAGNOSIS — Z4781 Encounter for orthopedic aftercare following surgical amputation: Secondary | ICD-10-CM | POA: Diagnosis not present

## 2014-07-14 DIAGNOSIS — E119 Type 2 diabetes mellitus without complications: Secondary | ICD-10-CM | POA: Diagnosis not present

## 2014-07-17 DIAGNOSIS — G609 Hereditary and idiopathic neuropathy, unspecified: Secondary | ICD-10-CM | POA: Diagnosis not present

## 2014-07-17 DIAGNOSIS — E119 Type 2 diabetes mellitus without complications: Secondary | ICD-10-CM | POA: Diagnosis not present

## 2014-07-17 DIAGNOSIS — I129 Hypertensive chronic kidney disease with stage 1 through stage 4 chronic kidney disease, or unspecified chronic kidney disease: Secondary | ICD-10-CM | POA: Diagnosis not present

## 2014-07-17 DIAGNOSIS — L89619 Pressure ulcer of right heel, unspecified stage: Secondary | ICD-10-CM | POA: Diagnosis not present

## 2014-07-17 DIAGNOSIS — I739 Peripheral vascular disease, unspecified: Secondary | ICD-10-CM | POA: Diagnosis not present

## 2014-07-17 DIAGNOSIS — Z4781 Encounter for orthopedic aftercare following surgical amputation: Secondary | ICD-10-CM | POA: Diagnosis not present

## 2014-07-20 ENCOUNTER — Encounter: Payer: Self-pay | Admitting: Cardiology

## 2014-07-23 ENCOUNTER — Encounter: Payer: Self-pay | Admitting: Physical Therapy

## 2014-07-23 ENCOUNTER — Ambulatory Visit: Payer: Medicare Other | Attending: Orthopedic Surgery | Admitting: Physical Therapy

## 2014-07-23 DIAGNOSIS — Z7901 Long term (current) use of anticoagulants: Secondary | ICD-10-CM | POA: Insufficient documentation

## 2014-07-23 DIAGNOSIS — I482 Chronic atrial fibrillation: Secondary | ICD-10-CM | POA: Insufficient documentation

## 2014-07-23 DIAGNOSIS — R6889 Other general symptoms and signs: Secondary | ICD-10-CM | POA: Diagnosis not present

## 2014-07-23 DIAGNOSIS — M069 Rheumatoid arthritis, unspecified: Secondary | ICD-10-CM | POA: Diagnosis not present

## 2014-07-23 DIAGNOSIS — E039 Hypothyroidism, unspecified: Secondary | ICD-10-CM | POA: Diagnosis not present

## 2014-07-23 DIAGNOSIS — I509 Heart failure, unspecified: Secondary | ICD-10-CM | POA: Insufficient documentation

## 2014-07-23 DIAGNOSIS — R269 Unspecified abnormalities of gait and mobility: Secondary | ICD-10-CM | POA: Diagnosis not present

## 2014-07-23 DIAGNOSIS — J449 Chronic obstructive pulmonary disease, unspecified: Secondary | ICD-10-CM | POA: Insufficient documentation

## 2014-07-23 DIAGNOSIS — Z89512 Acquired absence of left leg below knee: Secondary | ICD-10-CM

## 2014-07-23 DIAGNOSIS — R531 Weakness: Secondary | ICD-10-CM | POA: Diagnosis not present

## 2014-07-23 DIAGNOSIS — E119 Type 2 diabetes mellitus without complications: Secondary | ICD-10-CM | POA: Insufficient documentation

## 2014-07-23 NOTE — Therapy (Signed)
[facility] [address] [place], [place], [address] Phone: [phone]   Fax:  [phone]  Physical Therapy Evaluation  Patient Details  Name: Sydney [name] MRN: [id number] Date of Birth: 1920/11/17  Encounter Date: 07/23/2014      PT End of Session - 07/23/14 1729    Visit Number 1   Number of Visits 17   Date for PT Re-Evaluation 09/19/13   PT Start Time 1318   PT Stop Time 1400   PT Time Calculation (min) 42 min   Equipment Utilized During Treatment Gait belt   Activity Tolerance Patient tolerated treatment well   Behavior During Therapy [facility] for tasks assessed/performed      Past Medical History  Diagnosis Date  . Lung disease     BRONCHOSPASTIC  . Neuropathy     takes Gabapentin bid  . Hypercholesterolemia     doesn't require meds for this  . Anemia   . COPD (chronic obstructive pulmonary disease)   . Blood transfusion ~ 2012    "once; not related to a surgery or bleeding that I remember" (06/06/2013)  . Hypertension     takes Prinizide daily  . Chronic atrial fibrillation     takes Coumadin as instructed  . Peripheral vascular disease   . CHF (congestive heart failure)     takes Lasix every other day  . Emphysema   . History of head injury     many yrs ago  . Vaginal discharge     has seen GYN but says its nothing to worry about  . Diabetes mellitus     takes Metformin and Amaryl daily  . Hypothyroidism     takes Synthroid daily  . Macular degeneration     dry  . Bronchial pneumonia     "once; several years ago" (06/06/2013)  . Rheumatoid arthritis     "shoulders; daughter gives me shots of Methotrexate q week" (06/06/2013)    Past Surgical History  Procedure Laterality Date  . Total hip arthroplasty Left ?2013  . Back surgery    . Fracture surgery  2011    LEFT HIP FX /REPLACEMENT   . Cholecystectomy  1951  . Dilation and curettage of uterus    . Amputation  11/09/2011    Procedure:  AMPUTATION DIGIT;  Surgeon: Mcarthur Rossetti, MD;  Location: [place];  Service: Orthopedics;  Laterality: Right;  Right Great Toe Amputation  . Amputation  01/05/2012    Procedure: AMPUTATION RAY;  Surgeon: Mcarthur Rossetti, MD;  Location: [facility];  Service: Orthopedics;  Laterality: Left;  Amputation Left 4th and 5th Toes  . Cardioversion    . Amputation  05/31/2012    Procedure: AMPUTATION RAY;  Surgeon: Newt Minion, MD;  Location: [place];  Service: Orthopedics;  Laterality: Left;  Left foot 1st ray amp  . Amputation  06/21/2012    Procedure: AMPUTATION BELOW KNEE;  Surgeon: Newt Minion, MD;  Location: [place];  Service: Orthopedics;  Laterality: Left;  Left Below Knee Amputation  . Foot amputation through metatarsal Right 06/06/2013    midfoot/notes 06/06/2013  . Joint replacement    . Appendectomy    . Total abdominal hysterectomy  1975  . Cervical disc surgery      "the top vertebrae" (06/06/2013)  . Cataract extraction w/ intraocular lens  implant, bilateral Bilateral   . Eye surgery      bilateral -cataract surgery  . Amputation Right 06/06/2013  Procedure: RIGHT MIDFOOT AMPUTATION;  Surgeon: Newt Minion, MD;  Location: [place];  Service: Orthopedics;  Laterality: Right;    There were no vitals taken for this visit.  Visit Diagnosis:  Weakness generalized - Plan: PT plan of care cert/re-cert  Activity intolerance - Plan: PT plan of care cert/re-cert  Abnormality of gait - Plan: PT plan of care cert/re-cert  Status post below knee amputation of left lower extremity - Plan: PT plan of care cert/re-cert      Subjective Assessment - 07/23/14 1323    Symptoms Her prosthesis is orginal from 2014. Her right foot was partial foot amputation & heel pressure wound Oct 2014. She presents for PT evaluation.    Patient Stated Goals To walk around assistive living facility.    Currently in Pain? No/denies          [facility] PT Assessment - 07/23/14 1315    Assessment    Medical Diagnosis L BKA, R partial foot   Precautions   Precautions Fall   Restrictions   Weight Bearing Restrictions No   Balance Screen   Has the patient fallen in the past 6 months No   Has the patient had a decrease in activity level because of a fear of falling?  No   Is the patient reluctant to leave their home because of a fear of falling?  No   Home Environment   Living Enviornment Assisted living   Additional Comments Daughter lives in area in single level house with 4" threshold   Prior Function   Level of Independence Independent with basic ADLs  prior to left amputation independent but developed issues   Posture/Postural Control   Posture/Postural Control Postural limitations   Postural Limitations Rounded Shoulders;Forward head;Flexed trunk;Weight shift right   AROM   Right Knee Extension -13   Right Ankle Dorsiflexion -28   PROM   Left Knee Extension -19   Strength   Overall Strength Deficits   Overall Strength Comments Tested sitting: hips <3/5, knee ext 3-/5  UEs shoulders right 3-/5, left 2/5, elbows grossly 3/5   Transfers   Transfers Sit to Stand;Stand to Sit   Sit to Stand 3: Mod assist;With upper extremity assist;With armrests  from w/c to RW   Stand to Sit 4: Min assist;With upper extremity assist;With armrests  RW to w/c   Ambulation/Gait   Ambulation/Gait Yes   Ambulation/Gait Assistance 2: Max assist   Ambulation/Gait Assistance Details right partial foot amputation with resting splint  PT stopped gait assessment due to safety with splint   Ambulation Distance (Feet) 4 Feet   Assistive device Rolling walker  left BKA prosthesis   Static Standing Balance   Static Standing - Balance Support Bilateral upper extremity supported   Static Standing - Level of Assistance 4: Min assist   Static Standing - Comment/# of Minutes 2 minutes   Dynamic Standing Balance   Dynamic Standing - Balance Support Right upper extremity supported;Left upper extremity  supported   Dynamic Standing - Level of Assistance 4: Min assist   Dynamic Standing - Balance Activities Eyes opn;Head turns;Reaching for objects   Dynamic Standing - Comments reaches 2" anteriorly with right or left hand, rotates head only to scan, reaches to knee level towards floor            PT Education - 07/23/14 1315    Education provided Yes   Education Details PT spoke with prosthetist and relayed info to family including rationale: right extra depth  shoe with custom insert shoe filler with heel lift in sole of shoe to accommodate ankle contracture, double upright AFO due to wounds/skin integrity history   Person(s) Educated Patient;Child(ren)   Methods Explanation   Comprehension Verbalized understanding     Self-care: discussion of AFO & recommendations for partial foot amputation.      PT Short Term Goals - 07/23/14 1739    PT SHORT TERM GOAL #1   Title donnes prostheses with moderate assist. (Target Date: 08/21/14)   Time 4   Period Weeks   Status New   PT SHORT TERM GOAL #2   Title Sit to/from stand w/c to rolling walker with supervision. (Target Date: 08/21/14)   Time 4   Period Weeks   Status New   PT SHORT TERM GOAL #3   Title reaches 4" with rolling walker support with supervision.  (Target Date: 08/21/14)   Time 4   Period Weeks   Status New   PT SHORT TERM GOAL #4   Title ambulates 20' with rolling walker & prostheses with moderate assistance  (Target Date: 08/21/14)   Time 4   Period Weeks   Status New          PT Long Term Goals - 07/23/14 1743    PT LONG TERM GOAL #1   Title tolerates wear of bilateral (TTA & partial foot) prostheses >50% of awake hours.  (Target Date: 09/19/14)   Time 8   Period Weeks   Status New   PT LONG TERM GOAL #2   Title verbalizes proper donning, adjusting ply socks, cleaning and adjusting wear schedule.  (Target Date: 09/19/14)   Time 8   Period Weeks   Status New   PT LONG TERM GOAL #3   Title stand-pivot transfer  with rolling walker with supervision. (Target Date: 09/19/14)   Time 8   Period Weeks   Status New   PT LONG TERM GOAL #4   Title standing balance: reaches 5", rotates head /upper trunk to scan environment & manages pants for toileting with rolling walker with supervision. (Target Date: 09/19/14)   Time 8   Period Weeks   Status New   PT LONG TERM GOAL #5   Title ambulates 30' with rolling walker & prostheses with minimal assist. (Target Date: 09/19/14)   Time 8   Period Weeks   Status New          Plan - 07/23/14 1730    Clinical Impression Statement This 80yo female has history of left Transtibial Amputation and developed wound on right foot after 3 sessions to initiate prosthetic gait training. The wound took over a year to heal. She has become very deconditioned and gained ~15#. She presents for PT evaluation to improve mobility. She has significant weakness, impaired balance, dependent in transfers / standing ADLs like toileting and gait.   Pt will benefit from skilled therapeutic intervention in order to improve on the following deficits Abnormal gait;Decreased activity tolerance;Decreased balance;Decreased mobility;Decreased knowledge of use of DME;Decreased range of motion;Decreased strength;Postural dysfunction;Other (comment)  prosthetic dependency   Rehab Potential Good   PT Frequency 2x / week   PT Duration 8 weeks   PT Treatment/Interventions ADLs/Self Care Home Management;DME Instruction;Gait training;Stair training;Functional mobility training;Therapeutic activities;Therapeutic exercise;Balance training;Neuromuscular re-education;Other (comment)  prosthetic training   PT Next Visit Plan HEP, standing balance in parallel bars, therapeutic exercise   PT Home Exercise Plan strength on bed   Consulted and Agree with Plan of Care Patient;Family member/caregiver  Family Member Consulted daughter          G-Codes - Aug 20, 2014 1751    Functional Assessment Tool Used moderate  assist sit to stand, dependent in transfers   Functional Limitation Changing and maintaining body position   Changing and Maintaining Body Position Current Status [phone]) At least 80 percent but less than 100 percent impaired, limited or restricted   Changing and Maintaining Body Position Goal Status ([id number]) At least 40 percent but less than 60 percent impaired, limited or restricted           Prosthetics Assessment - 08-20-14 1315    Prosthetic Care Dependent with Skin check;Residual limb care;Prosthetic cleaning;Correct ply sock adjustment;Proper wear schedule/adjustment;Proper weight-bearing schedule/adjustment   Prosthetic Care Comments  CNAs are donning prosthesis for her   Donning prosthesis  +1 Total assist   Doffing prosthesis  +1 Total assist   Current prosthetic wear tolerance (days/week)  7 days/wk   Current prosthetic wear tolerance (#hours/day)  on/off for total of 2-3 hours (awake ~10 hours/day)   Current prosthetic weight-bearing tolerance (hours/day)  3 minutes with no c/o pain or discomfort with walker support   Residual limb condition  frail skin with no open areas but scars indicating previous issues, normal color, moisture normal   K code/activity level with prosthetic use  1          Problem List Patient Active Problem List   Diagnosis Date Noted  . Foot ulcer, right 02/08/2014    Class: Chronic  . PVD (peripheral vascular disease) 02/08/2014    Class: Chronic  . Acute chest pain 02/07/2014  . HCAP (healthcare-associated pneumonia) 11/10/2013  . Pneumonia 11/10/2013  . Acute diastolic heart failure 81/85/6314  . Acute on chronic anemia 06/09/2013  . Acute renal insufficiency 06/07/2013    Class: Acute  . Status post transmetatarsal amputation of right foot 06/07/2013    Class: Acute  . Chest pain 04/29/2013  . Phantom limb pain 07/24/2012  . Diabetic neuropathy 07/24/2012  . Unilateral complete BKA-left 06/24/2012  . Osteomyelitis of toe 01/05/2012   . Osteomyelitis of toe of left foot 01/05/2012  . Chronic osteomyelitis of toe 11/09/2011  . HYPOTHYROIDISM 06/11/2007  . DIABETES MELLITUS, TYPE II 06/11/2007  . HYPERLIPIDEMIA 06/11/2007  . Essential hypertension 06/11/2007  . Atrial fibrillation 06/11/2007  . COPD 06/11/2007    Shaymus Eveleth 08-20-14, 5:54 PM  Jamey Reas, PT, DPT PT Specializing in [place] 08/20/14 5:55 PM Phone:  [phone]  Fax:  [phone] [place] [address] [place], [place] [address]

## 2014-07-27 ENCOUNTER — Encounter: Payer: Self-pay | Admitting: Physical Therapy

## 2014-07-27 ENCOUNTER — Ambulatory Visit: Payer: Medicare Other | Admitting: Physical Therapy

## 2014-07-27 DIAGNOSIS — R269 Unspecified abnormalities of gait and mobility: Secondary | ICD-10-CM

## 2014-07-27 DIAGNOSIS — Z89512 Acquired absence of left leg below knee: Secondary | ICD-10-CM

## 2014-07-27 DIAGNOSIS — R531 Weakness: Secondary | ICD-10-CM

## 2014-07-27 DIAGNOSIS — R6889 Other general symptoms and signs: Secondary | ICD-10-CM

## 2014-07-27 DIAGNOSIS — J449 Chronic obstructive pulmonary disease, unspecified: Secondary | ICD-10-CM | POA: Diagnosis not present

## 2014-07-27 DIAGNOSIS — I482 Chronic atrial fibrillation: Secondary | ICD-10-CM | POA: Diagnosis not present

## 2014-07-27 NOTE — Therapy (Signed)
[facility] [address] [place], [place], [address] Phone: [phone]   Fax:  [phone]  Physical Therapy Treatment  Patient Details  Name: Sydney [name] MRN: [id number] Date of Birth: Sep 01, 1920  Encounter Date: 07/27/2014      PT End of Session - 07/27/14 1315    Visit Number 2   Number of Visits 17   Date for PT Re-Evaluation 09/19/13   PT Start Time 1315   PT Stop Time 1405   PT Time Calculation (min) 50 min   Equipment Utilized During Treatment Gait belt   Activity Tolerance Patient tolerated treatment well   Behavior During Therapy [facility] for tasks assessed/performed      Past Medical History  Diagnosis Date  . Lung disease     BRONCHOSPASTIC  . Neuropathy     takes Gabapentin bid  . Hypercholesterolemia     doesn't require meds for this  . Anemia   . COPD (chronic obstructive pulmonary disease)   . Blood transfusion ~ 2012    "once; not related to a surgery or bleeding that I remember" (06/06/2013)  . Hypertension     takes Prinizide daily  . Chronic atrial fibrillation     takes Coumadin as instructed  . Peripheral vascular disease   . CHF (congestive heart failure)     takes Lasix every other day  . Emphysema   . History of head injury     many yrs ago  . Vaginal discharge     has seen GYN but says its nothing to worry about  . Diabetes mellitus     takes Metformin and Amaryl daily  . Hypothyroidism     takes Synthroid daily  . Macular degeneration     dry  . Bronchial pneumonia     "once; several years ago" (06/06/2013)  . Rheumatoid arthritis     "shoulders; daughter gives me shots of Methotrexate q week" (06/06/2013)    Past Surgical History  Procedure Laterality Date  . Total hip arthroplasty Left ?2013  . Back surgery    . Fracture surgery  2011    LEFT HIP FX /REPLACEMENT   . Cholecystectomy  1951  . Dilation and curettage of uterus    . Amputation  11/09/2011    Procedure:  AMPUTATION DIGIT;  Surgeon: Mcarthur Rossetti, MD;  Location: [place];  Service: Orthopedics;  Laterality: Right;  Right Great Toe Amputation  . Amputation  01/05/2012    Procedure: AMPUTATION RAY;  Surgeon: Mcarthur Rossetti, MD;  Location: [facility];  Service: Orthopedics;  Laterality: Left;  Amputation Left 4th and 5th Toes  . Cardioversion    . Amputation  05/31/2012    Procedure: AMPUTATION RAY;  Surgeon: Newt Minion, MD;  Location: [place];  Service: Orthopedics;  Laterality: Left;  Left foot 1st ray amp  . Amputation  06/21/2012    Procedure: AMPUTATION BELOW KNEE;  Surgeon: Newt Minion, MD;  Location: [place];  Service: Orthopedics;  Laterality: Left;  Left Below Knee Amputation  . Foot amputation through metatarsal Right 06/06/2013    midfoot/notes 06/06/2013  . Joint replacement    . Appendectomy    . Total abdominal hysterectomy  1975  . Cervical disc surgery      "the top vertebrae" (06/06/2013)  . Cataract extraction w/ intraocular lens  implant, bilateral Bilateral   . Eye surgery      bilateral -cataract surgery  . Amputation Right 06/06/2013  Procedure: RIGHT MIDFOOT AMPUTATION;  Surgeon: Newt Minion, MD;  Location: [place];  Service: Orthopedics;  Laterality: Right;    There were no vitals taken for this visit.  Visit Diagnosis:  Weakness generalized  Activity intolerance  Abnormality of gait  Status post below knee amputation of left lower extremity      Subjective Assessment - 07/27/14 1320    Symptoms Prosthesis is feeling okay. She has worn prosthesis since breakfast.   Currently in Pain? No/denies            [facility] Adult PT Treatment/Exercise - 07/27/14 1315    Transfers   Transfers Sit to Stand;Stand to Sit   Sit to Stand 4: Min assist;3: Mod assist  PT demo/instructed technique, progressed mod A to min A   Sit to Stand Details (indicate cue type and reason) cues on wt shift   Stand to Sit 4: Min assist;With upper extremity assist   Stand  to Sit Details cues on technique   Squat Pivot Transfers 4: Min assist;With upper extremity assistance  wc armrest removed for transfer   Squat Pivot Transfer Details (indicate cue type and reason) tactile / verbal cues on wt shift & position   Prosthetics   Current prosthetic wear tolerance (days/week)  7 days/wk   Current prosthetic wear tolerance (#hours/day)  PT discussed goal for all out of bed time for function   Education Provided Skin check;Residual limb care;Correct ply sock adjustment  written AVS for lotion use, dtr to bring thicker liner next   Person(s) Educated Patient;Child(ren)   Education Method Explanation;Demonstration;Handout          PT Education - 07/27/14 1315    Education provided Yes   Education Details Patient demo HEP from home health PT: hip flexion, LAQs, seated hip abduction. PT advised to continue & added bridging.   Person(s) Educated Patient;Child(ren)   Methods Explanation;Demonstration;Handout   Comprehension Verbalized understanding;Returned demonstration;Need further instruction          PT Short Term Goals - 07/27/14 1315    PT SHORT TERM GOAL #1   Title donnes prostheses with moderate assist. (Target Date: 08/21/14)   Time 4   Period Weeks   Status New   PT SHORT TERM GOAL #2   Title Sit to/from stand w/c to rolling walker with supervision. (Target Date: 08/21/14)   Time 4   Period Weeks   Status New   PT SHORT TERM GOAL #3   Title reaches 4" with rolling walker support with supervision.  (Target Date: 08/21/14)   Time 4   Period Weeks   Status New   PT SHORT TERM GOAL #4   Title ambulates 20' with rolling walker & prostheses with moderate assistance  (Target Date: 08/21/14)   Time 4   Period Weeks   Status New          PT Long Term Goals - 07/27/14 1315    PT LONG TERM GOAL #1   Title tolerates wear of bilateral (TTA & partial foot) prostheses >50% of awake hours.  (Target Date: 09/19/14)   Time 8   Period Weeks   Status New    PT LONG TERM GOAL #2   Title verbalizes proper donning, adjusting ply socks, cleaning and adjusting wear schedule.  (Target Date: 09/19/14)   Time 8   Period Weeks   Status New   PT LONG TERM GOAL #3   Title stand-pivot transfer with rolling walker with supervision. (Target Date: 09/19/14)  Time 8   Period Weeks   Status New   PT LONG TERM GOAL #4   Title standing balance: reaches 5", rotates head /upper trunk to scan environment & manages pants for toileting with rolling walker with supervision. (Target Date: 09/19/14)   Time 8   Period Weeks   Status New   PT LONG TERM GOAL #5   Title ambulates 30' with rolling walker & prostheses with minimal assist. (Target Date: 09/19/14)   Time 8   Period Weeks   Status New          Plan - 07/27/14 1315    Clinical Impression Statement Patient & dtr appear to understand residual limb care better. She improved transfers with less assistance after instruction & practice.   Pt will benefit from skilled therapeutic intervention in order to improve on the following deficits Abnormal gait;Decreased activity tolerance;Decreased balance;Decreased mobility;Decreased knowledge of use of DME;Decreased range of motion;Decreased strength;Postural dysfunction;Other (comment)  prosthetic dependency   Rehab Potential Good   PT Frequency 2x / week   PT Duration 8 weeks   PT Treatment/Interventions ADLs/Self Care Home Management;DME Instruction;Gait training;Stair training;Functional mobility training;Therapeutic activities;Therapeutic exercise;Balance training;Neuromuscular re-education;Other (comment)  prosthetic training   PT Next Visit Plan HEP, standing balance in parallel bars, therapeutic exercise   PT Home Exercise Plan strength on bed   Consulted and Agree with Plan of Care Patient;Family member/caregiver   Family Member Consulted daughter        Problem List Patient Active Problem List   Diagnosis Date Noted  . Foot ulcer, right 02/08/2014     Class: Chronic  . PVD (peripheral vascular disease) 02/08/2014    Class: Chronic  . Acute chest pain 02/07/2014  . HCAP (healthcare-associated pneumonia) 11/10/2013  . Pneumonia 11/10/2013  . Acute diastolic heart failure 30/02/6225  . Acute on chronic anemia 06/09/2013  . Acute renal insufficiency 06/07/2013    Class: Acute  . Status post transmetatarsal amputation of right foot 06/07/2013    Class: Acute  . Chest pain 04/29/2013  . Phantom limb pain 07/24/2012  . Diabetic neuropathy 07/24/2012  . Unilateral complete BKA-left 06/24/2012  . Osteomyelitis of toe 01/05/2012  . Osteomyelitis of toe of left foot 01/05/2012  . Chronic osteomyelitis of toe 11/09/2011  . HYPOTHYROIDISM 06/11/2007  . DIABETES MELLITUS, TYPE II 06/11/2007  . HYPERLIPIDEMIA 06/11/2007  . Essential hypertension 06/11/2007  . Atrial fibrillation 06/11/2007  . COPD 06/11/2007    Koriana Stepien 07/27/2014, 3:23 PM   Jamey Reas, PT, DPT PT Specializing in [place] 07/27/2014 3:24 PM Phone:  [phone]  Fax:  [phone] [place] [address] [place], [place] [address]

## 2014-07-27 NOTE — Patient Instructions (Signed)
Prosthesis care: This is required to protect her skin with prosthesis wear /use. ONLY use lotion at night.  When removing prosthesis for the day (after she has transferred to her bed at night), have a warm wash cloth handy.  Once you remove prosthesis, wipe her left limb off with warm wash cloth. Then apply lotion.  In the morning, her left leg needs to be wiped off to remove lotion. This can be done with sponge bath or if not bathing, just wipe it off. Then apply the prosthesis.  Exercises: 1. Continue raising knees up. 2. Continue straightening knee / lifting foot. 3. Continue stepping out to the side. 4. Lie in bed with prosthesis on. Lift bottom up as if having bed pan under you. Repeat 10 times.

## 2014-07-29 ENCOUNTER — Ambulatory Visit: Payer: Medicare Other | Admitting: Physical Therapy

## 2014-07-29 ENCOUNTER — Encounter: Payer: Self-pay | Admitting: Physical Therapy

## 2014-07-29 DIAGNOSIS — R6889 Other general symptoms and signs: Secondary | ICD-10-CM

## 2014-07-29 DIAGNOSIS — J449 Chronic obstructive pulmonary disease, unspecified: Secondary | ICD-10-CM | POA: Diagnosis not present

## 2014-07-29 DIAGNOSIS — R269 Unspecified abnormalities of gait and mobility: Secondary | ICD-10-CM

## 2014-07-29 DIAGNOSIS — R531 Weakness: Secondary | ICD-10-CM

## 2014-07-29 DIAGNOSIS — Z89512 Acquired absence of left leg below knee: Secondary | ICD-10-CM

## 2014-07-29 DIAGNOSIS — I482 Chronic atrial fibrillation: Secondary | ICD-10-CM | POA: Diagnosis not present

## 2014-07-30 NOTE — Therapy (Signed)
[facility] [address] [place], [place], [address] Phone: [phone]   Fax:  [phone]  Physical Therapy Treatment  Patient Details  Name: KONSTANCE HAPPEL MRN: [id number] Date of Birth: [birth year]  Encounter Date: 07/29/2014      PT End of Session - 07/29/14 1400    Visit Number 3   Number of Visits 17   Date for PT Re-Evaluation 09/19/13   PT Start Time 6063   PT Stop Time 1358   PT Time Calculation (min) 41 min   Equipment Utilized During Treatment Gait belt   Activity Tolerance Patient tolerated treatment well   Behavior During Therapy [facility] for tasks assessed/performed      Past Medical History  Diagnosis Date  . Lung disease     BRONCHOSPASTIC  . Neuropathy     takes Gabapentin bid  . Hypercholesterolemia     doesn't require meds for this  . Anemia   . COPD (chronic obstructive pulmonary disease)   . Blood transfusion ~ 2012    "once; not related to a surgery or bleeding that I remember" (06/06/2013)  . Hypertension     takes Prinizide daily  . Chronic atrial fibrillation     takes Coumadin as instructed  . Peripheral vascular disease   . CHF (congestive heart failure)     takes Lasix every other day  . Emphysema   . History of head injury     many yrs ago  . Vaginal discharge     has seen GYN but says its nothing to worry about  . Diabetes mellitus     takes Metformin and Amaryl daily  . Hypothyroidism     takes Synthroid daily  . Macular degeneration     dry  . Bronchial pneumonia     "once; several years ago" (06/06/2013)  . Rheumatoid arthritis     "shoulders; daughter gives me shots of Methotrexate q week" (06/06/2013)    Past Surgical History  Procedure Laterality Date  . Total hip arthroplasty Left ?2013  . Back surgery    . Fracture surgery  2011    LEFT HIP FX /REPLACEMENT   . Cholecystectomy  1951  . Dilation and curettage of uterus    . Amputation  11/09/2011    Procedure:  AMPUTATION DIGIT;  Surgeon: Mcarthur Rossetti, MD;  Location: [place];  Service: Orthopedics;  Laterality: Right;  Right Great Toe Amputation  . Amputation  01/05/2012    Procedure: AMPUTATION RAY;  Surgeon: Mcarthur Rossetti, MD;  Location: [facility];  Service: Orthopedics;  Laterality: Left;  Amputation Left 4th and 5th Toes  . Cardioversion    . Amputation  05/31/2012    Procedure: AMPUTATION RAY;  Surgeon: Newt Minion, MD;  Location: [place];  Service: Orthopedics;  Laterality: Left;  Left foot 1st ray amp  . Amputation  06/21/2012    Procedure: AMPUTATION BELOW KNEE;  Surgeon: Newt Minion, MD;  Location: [place];  Service: Orthopedics;  Laterality: Left;  Left Below Knee Amputation  . Foot amputation through metatarsal Right 06/06/2013    midfoot/notes 06/06/2013  . Joint replacement    . Appendectomy    . Total abdominal hysterectomy  1975  . Cervical disc surgery      "the top vertebrae" (06/06/2013)  . Cataract extraction w/ intraocular lens  implant, bilateral Bilateral   . Eye surgery      bilateral -cataract surgery  . Amputation Right 06/06/2013  Procedure: RIGHT MIDFOOT AMPUTATION;  Surgeon: Newt Minion, MD;  Location: [place];  Service: Orthopedics;  Laterality: Right;    There were no vitals taken for this visit.  Visit Diagnosis:  Weakness generalized  Activity intolerance  Abnormality of gait  Status post below knee amputation of left lower extremity      Subjective Assessment - 07/29/14 1320    Symptoms No new complaints. No pain or falls.   Currently in Pain? No/denies            [facility] Adult PT Treatment/Exercise - 07/29/14 1329    Transfers   Sit to Stand 4: Min assist  x 3 reps   Sit to Stand Details (indicate cue type and reason) in parallel bars with prosthesis and PRAFO boot to right foot/leg, cues on weight shift and to scoot to edge of chair before standing.                       Stand to Sit 4: Min assist  x 3 reps   Stand to Sit  Details cues to reach back and use arms to lower self down to chair.                        Squat Pivot Transfers 3: Mod assist;4: Min Psychiatric nurse Details (indicate cue type and reason) cues on technique and weight shift, increased assist needed with transfering toward her right side.   Prosthetics   Prosthetic Care Comments  CNAs are donning prosthesis for her. Tried thicker liner today and able to get prosthesis on in clinic without issues reported with donning and standing.                   Current prosthetic wear tolerance (days/week)  7 days/wk   Current prosthetic wear tolerance (#hours/day)  Daughter reports wearing prosthesiss all day once out of bed   Residual limb condition  frail skin with no open areas but scars indicating previous issues, normal color, moisture normal   Education Provided Correct ply sock adjustment;Proper wear schedule/adjustment;Residual limb care  need to remove to dry throughout day-not doing   Person(s) Educated Patient;Child(ren)  daughter   Education Method Explanation;Demonstration;Verbal cues   Education Method Verbalized understanding;Needs further instruction   Donning Prosthesis Minimal assist;Moderate assist   Doffing Prosthesis Minimal assist     In parallel bars: standing with UE support and manual support to right leg/foot to assist with maintaining correct position: lateral weight shifting with holds x 10 each way, forward/backward weight shifting with holds x 10 each way. Right leg stance: left prosthetic stepping forward and backward x 10 each way.   Exercises on mat: with red pball under legs: bridge 2x10 reps and lower trunk rotation x10 each way.        PT Short Term Goals - 07/29/14 1400    PT SHORT TERM GOAL #1   Title donnes prostheses with moderate assist. (Target Date: 08/21/14)   Time 4   Period Weeks   Status On-going   PT SHORT TERM GOAL #2   Title Sit to/from stand w/c to rolling walker with supervision.  (Target Date: 08/21/14)   Time 4   Period Weeks   Status On-going   PT SHORT TERM GOAL #3   Title reaches 4" with rolling walker support with supervision.  (Target Date: 08/21/14)   Time 4   Period Weeks   Status  On-going   PT SHORT TERM GOAL #4   Title ambulates 20' with rolling walker & prostheses with moderate assistance  (Target Date: 08/21/14)   Time 4   Period Weeks   Status On-going          PT Long Term Goals - 07/29/14 1400    PT LONG TERM GOAL #1   Title tolerates wear of bilateral (TTA & partial foot) prostheses >50% of awake hours.  (Target Date: 09/19/14)   Time 8   Period Weeks   Status On-going   PT LONG TERM GOAL #2   Title verbalizes proper donning, adjusting ply socks, cleaning and adjusting wear schedule.  (Target Date: 09/19/14)   Time 8   Period Weeks   Status On-going   PT LONG TERM GOAL #3   Title stand-pivot transfer with rolling walker with supervision. (Target Date: 09/19/14)   Time 8   Period Weeks   Status On-going   PT LONG TERM GOAL #4   Title standing balance: reaches 5", rotates head /upper trunk to scan environment & manages pants for toileting with rolling walker with supervision. (Target Date: 09/19/14)   Time 8   Period Weeks   Status On-going   PT LONG TERM GOAL #5   Title ambulates 23' with rolling walker & prostheses with minimal assist. (Target Date: 09/19/14)   Time 8   Period Weeks   Status On-going          Plan - 07/29/14 1400    Clinical Impression Statement Worked on standing posture and balance, along with strengthening today. Pt without complaints afterwards. Still waiting for prosthetic shoe for right foot. Currently pt is wearing a PRAFO style shoe that causes/allows the foot/ankle to roll/slide out to the side offering no support or stability. Limited session to standing only, no gait, due this.               Pt will benefit from skilled therapeutic intervention in order to improve on the following deficits Abnormal gait;Decreased  activity tolerance;Decreased balance;Decreased mobility;Decreased knowledge of use of DME;Decreased range of motion;Decreased strength;Postural dysfunction;Other (comment)  prosthetic dependency   Rehab Potential Good   PT Frequency 2x / week   PT Duration 8 weeks   PT Treatment/Interventions ADLs/Self Care Home Management;DME Instruction;Gait training;Stair training;Functional mobility training;Therapeutic activities;Therapeutic exercise;Balance training;Neuromuscular re-education;Other (comment)  prosthetic training   PT Next Visit Plan Continue with standing in parallel bars, exercises for posture and strenthening.   PT Home Exercise Plan continue with HEP issued   Consulted and Agree with Plan of Care Patient;Family member/caregiver   Family Member Consulted daughter       Problem List Patient Active Problem List   Diagnosis Date Noted  . Foot ulcer, right 02/08/2014    Class: Chronic  . PVD (peripheral vascular disease) 02/08/2014    Class: Chronic  . Acute chest pain 02/07/2014  . HCAP (healthcare-associated pneumonia) 11/10/2013  . Pneumonia 11/10/2013  . Acute diastolic heart failure 40/97/3532  . Acute on chronic anemia 06/09/2013  . Acute renal insufficiency 06/07/2013    Class: Acute  . Status post transmetatarsal amputation of right foot 06/07/2013    Class: Acute  . Chest pain 04/29/2013  . Phantom limb pain 07/24/2012  . Diabetic neuropathy 07/24/2012  . Unilateral complete BKA-left 06/24/2012  . Osteomyelitis of toe 01/05/2012  . Osteomyelitis of toe of left foot 01/05/2012  . Chronic osteomyelitis of toe 11/09/2011  . HYPOTHYROIDISM 06/11/2007  . DIABETES MELLITUS, TYPE II 06/11/2007  .  HYPERLIPIDEMIA 06/11/2007  . Essential hypertension 06/11/2007  . Atrial fibrillation 06/11/2007  . COPD 06/11/2007    Willow Ora 07/30/2014, 1:11 PM  Willow Ora, PTA, [place] [address] [place], [place]  [address] [phone] 07/30/2014, 1:14 PM

## 2014-08-03 ENCOUNTER — Encounter: Payer: Self-pay | Admitting: Physical Therapy

## 2014-08-03 ENCOUNTER — Ambulatory Visit: Payer: Medicare Other | Admitting: Physical Therapy

## 2014-08-03 DIAGNOSIS — J449 Chronic obstructive pulmonary disease, unspecified: Secondary | ICD-10-CM | POA: Diagnosis not present

## 2014-08-03 DIAGNOSIS — R6889 Other general symptoms and signs: Secondary | ICD-10-CM

## 2014-08-03 DIAGNOSIS — R269 Unspecified abnormalities of gait and mobility: Secondary | ICD-10-CM

## 2014-08-03 DIAGNOSIS — I482 Chronic atrial fibrillation: Secondary | ICD-10-CM | POA: Diagnosis not present

## 2014-08-03 DIAGNOSIS — R531 Weakness: Secondary | ICD-10-CM

## 2014-08-03 DIAGNOSIS — Z89512 Acquired absence of left leg below knee: Secondary | ICD-10-CM | POA: Diagnosis not present

## 2014-08-03 NOTE — Therapy (Signed)
[facility] [address] [place], [place], [address] Phone: [phone]   Fax:  [phone]  Physical Therapy Treatment  Patient Details  Name: Sydney [name] MRN: [id number] Date of Birth: 03-26-1921  Encounter Date: 08/03/2014      PT End of Session - 08/03/14 1409    Visit Number 4   Number of Visits 17   Date for PT Re-Evaluation 09/19/13   PT Start Time 2993   PT Stop Time 1443   PT Time Calculation (min) 38 min   Equipment Utilized During Treatment Gait belt   Activity Tolerance Patient tolerated treatment well   Behavior During Therapy [facility] for tasks assessed/performed      Past Medical History  Diagnosis Date  . Lung disease     BRONCHOSPASTIC  . Neuropathy     takes Gabapentin bid  . Hypercholesterolemia     doesn't require meds for this  . Anemia   . COPD (chronic obstructive pulmonary disease)   . Blood transfusion ~ 2012    "once; not related to a surgery or bleeding that I remember" (06/06/2013)  . Hypertension     takes Prinizide daily  . Chronic atrial fibrillation     takes Coumadin as instructed  . Peripheral vascular disease   . CHF (congestive heart failure)     takes Lasix every other day  . Emphysema   . History of head injury     many yrs ago  . Vaginal discharge     has seen GYN but says its nothing to worry about  . Diabetes mellitus     takes Metformin and Amaryl daily  . Hypothyroidism     takes Synthroid daily  . Macular degeneration     dry  . Bronchial pneumonia     "once; several years ago" (06/06/2013)  . Rheumatoid arthritis     "shoulders; daughter gives me shots of Methotrexate q week" (06/06/2013)    Past Surgical History  Procedure Laterality Date  . Total hip arthroplasty Left ?2013  . Back surgery    . Fracture surgery  2011    LEFT HIP FX /REPLACEMENT   . Cholecystectomy  1951  . Dilation and curettage of uterus    . Amputation  11/09/2011   Procedure: AMPUTATION DIGIT;  Surgeon: Mcarthur Rossetti, MD;  Location: [place];  Service: Orthopedics;  Laterality: Right;  Right Great Toe Amputation  . Amputation  01/05/2012    Procedure: AMPUTATION RAY;  Surgeon: Mcarthur Rossetti, MD;  [facility];  Service: Orthopedics;  Laterality: Left;  Amputation Left 4th and 5th Toes  . Cardioversion    . Amputation  05/31/2012    Procedure: AMPUTATION RAY;  Surgeon: Newt Minion, MD;  Location: [place];  Service: Orthopedics;  Laterality: Left;  Left foot 1st ray amp  . Amputation  06/21/2012    Procedure: AMPUTATION BELOW KNEE;  Surgeon: Newt Minion, MD;  Location: [place];  Service: Orthopedics;  Laterality: Left;  Left Below Knee Amputation  . Foot amputation through metatarsal Right 06/06/2013    midfoot/notes 06/06/2013  . Joint replacement    . Appendectomy    . Total abdominal hysterectomy  1975  . Cervical disc surgery      "the top vertebrae" (06/06/2013)  . Cataract extraction w/ intraocular lens  implant, bilateral Bilateral   . Eye surgery      bilateral -cataract surgery  . Amputation Right 06/06/2013  Procedure: RIGHT MIDFOOT AMPUTATION;  Surgeon: Newt Minion, MD;  Location: [facility];  Service: Orthopedics;  Laterality: Right;    There were no vitals taken for this visit.  Visit Diagnosis:  Weakness generalized  Activity intolerance  Abnormality of gait      Subjective Assessment - 08/03/14 1409    Symptoms No new complaints. No pain or falls. Has been standing some with her daughter at the ALF since last visit.   Currently in Pain? No/denies     Treatment: Neuro Re-ed: - standing in parallel bars x2 reps. 1-2 minutes on first stand and 4-5 minutes on second stand. Both with min assist to stand from wheelchair and for standing balance with standing. Right foot/PRAFO positioned between PTA feet to offer additional support and keep foot from rolling out with brace. While standing worked on lateral weight  shifting, forward/backward weight shifting and alternating UE reaches with cues on posture and technique.   Exercise - supine on mat with red pball under legs: bridge with 5 sec hold x10 reps and lower trunk rotation x 10 each way. Short arc quads x 10 each leg. Seated long arc quads x 10 each leg. PRAFO and prosthesis on for all exercises.         PT Short Term Goals - 07/29/14 1400    PT SHORT TERM GOAL #1   Title donnes prostheses with moderate assist. (Target Date: 08/21/14)   Time 4   Period Weeks   Status On-going   PT SHORT TERM GOAL #2   Title Sit to/from stand w/c to rolling walker with supervision. (Target Date: 08/21/14)   Time 4   Period Weeks   Status On-going   PT SHORT TERM GOAL #3   Title reaches 4" with rolling walker support with supervision.  (Target Date: 08/21/14)   Time 4   Period Weeks   Status On-going   PT SHORT TERM GOAL #4   Title ambulates 20' with rolling walker & prostheses with moderate assistance  (Target Date: 08/21/14)   Time 4   Period Weeks   Status On-going           PT Long Term Goals - 07/29/14 1400    PT LONG TERM GOAL #1   Title tolerates wear of bilateral (TTA & partial foot) prostheses >50% of awake hours.  (Target Date: 09/19/14)   Time 8   Period Weeks   Status On-going   PT LONG TERM GOAL #2   Title verbalizes proper donning, adjusting ply socks, cleaning and adjusting wear schedule.  (Target Date: 09/19/14)   Time 8   Period Weeks   Status On-going   PT LONG TERM GOAL #3   Title stand-pivot transfer with rolling walker with supervision. (Target Date: 09/19/14)   Time 8   Period Weeks   Status On-going   PT LONG TERM GOAL #4   Title standing balance: reaches 5", rotates head /upper trunk to scan environment & manages pants for toileting with rolling walker with supervision. (Target Date: 09/19/14)   Time 8   Period Weeks   Status On-going   PT LONG TERM GOAL #5   Title ambulates 53' with rolling walker & prostheses with minimal  assist. (Target Date: 09/19/14)   Time 8   Period Weeks   Status On-going            Plan - 08/03/14 1409    Clinical Impression Statement Continued to work on standing posture and balance with weight  shifting and decreased UE reliance today with minimal incresse in right foot pain. Pt going on Monday 12/28 for fitting of shoe at [employer]. Pt making progress with strength and activity tolerance as evidenced by increased standing tolerance/time today.                           Pt will benefit from skilled therapeutic intervention in order to improve on the following deficits Abnormal gait;Decreased activity tolerance;Decreased balance;Decreased mobility;Decreased knowledge of use of DME;Decreased range of motion;Decreased strength;Postural dysfunction;Other (comment)  prosthetic dependency   Rehab Potential Good   PT Frequency 2x / week   PT Duration 8 weeks   PT Treatment/Interventions ADLs/Self Care Home Management;DME Instruction;Gait training;Stair training;Functional mobility training;Therapeutic activities;Therapeutic exercise;Balance training;Neuromuscular re-education;Other (comment)  prosthetic training   PT Next Visit Plan Continue with standing in parallel bars, exercises for posture and strenthening.   PT Home Exercise Plan continue with HEP issued   Consulted and Agree with Plan of Care Patient;Family member/caregiver   Family Member Consulted daughter        Problem List Patient Active Problem List   Diagnosis Date Noted  . Foot ulcer, right 02/08/2014    Class: Chronic  . PVD (peripheral vascular disease) 02/08/2014    Class: Chronic  . Acute chest pain 02/07/2014  . HCAP (healthcare-associated pneumonia) 11/10/2013  . Pneumonia 11/10/2013  . Acute diastolic heart failure 49/44/9675  . Acute on chronic anemia 06/09/2013  . Acute renal insufficiency 06/07/2013    Class: Acute  . Status post transmetatarsal amputation of right foot 06/07/2013    Class: Acute  .  Chest pain 04/29/2013  . Phantom limb pain 07/24/2012  . Diabetic neuropathy 07/24/2012  . Unilateral complete BKA-left 06/24/2012  . Osteomyelitis of toe 01/05/2012  . Osteomyelitis of toe of left foot 01/05/2012  . Chronic osteomyelitis of toe 11/09/2011  . HYPOTHYROIDISM 06/11/2007  . DIABETES MELLITUS, TYPE II 06/11/2007  . HYPERLIPIDEMIA 06/11/2007  . Essential hypertension 06/11/2007  . Atrial fibrillation 06/11/2007  . COPD 06/11/2007    Willow Ora 08/04/2014, 8:59 AM  Willow Ora, PTA, [facility] [address], [place], [place] [address] [phone] 08/04/2014, 8:59 AM

## 2014-08-05 ENCOUNTER — Encounter: Payer: Medicare Other | Admitting: Physical Therapy

## 2014-08-10 ENCOUNTER — Ambulatory Visit: Payer: Medicare Other

## 2014-08-10 DIAGNOSIS — I482 Chronic atrial fibrillation: Secondary | ICD-10-CM | POA: Diagnosis not present

## 2014-08-10 DIAGNOSIS — R269 Unspecified abnormalities of gait and mobility: Secondary | ICD-10-CM | POA: Diagnosis not present

## 2014-08-10 DIAGNOSIS — J449 Chronic obstructive pulmonary disease, unspecified: Secondary | ICD-10-CM | POA: Diagnosis not present

## 2014-08-10 DIAGNOSIS — Z89512 Acquired absence of left leg below knee: Secondary | ICD-10-CM | POA: Diagnosis not present

## 2014-08-10 DIAGNOSIS — R6889 Other general symptoms and signs: Secondary | ICD-10-CM | POA: Diagnosis not present

## 2014-08-10 DIAGNOSIS — R531 Weakness: Secondary | ICD-10-CM

## 2014-08-10 NOTE — Therapy (Signed)
Sydney [name] [address] [place], [place], [address] Phone: [phone]   Fax:  [phone]  Physical Therapy Treatment  Patient Details  Name: Sydney [name] MRN: [id number] Date of Birth: 10/15/1920  Encounter Date: 08/10/2014      PT End of Session - 08/10/14 1622    Visit Number 5   Number of Visits 17   Date for PT Re-Evaluation 09/19/13   PT Start Time 1315   PT Stop Time 1357   PT Time Calculation (min) 42 min   Equipment Utilized During Treatment Gait belt   Activity Tolerance Patient tolerated treatment well   Behavior During Therapy [facility] for tasks assessed/performed      Past Medical History  Diagnosis Date  . Lung disease     BRONCHOSPASTIC  . Neuropathy     takes Gabapentin bid  . Hypercholesterolemia     doesn't require meds for this  . Anemia   . COPD (chronic obstructive pulmonary disease)   . Blood transfusion ~ 2012    "once; not related to a surgery or bleeding that I remember" (06/06/2013)  . Hypertension     takes Prinizide daily  . Chronic atrial fibrillation     takes Coumadin as instructed  . Peripheral vascular disease   . CHF (congestive heart failure)     takes Lasix every other day  . Emphysema   . History of head injury     many yrs ago  . Vaginal discharge     has seen GYN but says its nothing to worry about  . Diabetes mellitus     takes Metformin and Amaryl daily  . Hypothyroidism     takes Synthroid daily  . Macular degeneration     dry  . Bronchial pneumonia     "once; several years ago" (06/06/2013)  . Rheumatoid arthritis     "shoulders; daughter gives me shots of Methotrexate q week" (06/06/2013)    Past Surgical History  Procedure Laterality Date  . Total hip arthroplasty Left ?2013  . Back surgery    . Fracture surgery  2011    LEFT HIP FX /REPLACEMENT   . Cholecystectomy  1951  . Dilation and curettage of uterus    . Amputation  11/09/2011   Procedure: AMPUTATION DIGIT;  Surgeon: Mcarthur Rossetti, MD;  Location: [place];  Service: Orthopedics;  Laterality: Right;  Right Great Toe Amputation  . Amputation  01/05/2012    Procedure: AMPUTATION RAY;  Surgeon: Mcarthur Rossetti, MD;  Location: [facility];  Service: Orthopedics;  Laterality: Left;  Amputation Left 4th and 5th Toes  . Cardioversion    . Amputation  05/31/2012    Procedure: AMPUTATION RAY;  Surgeon: Newt Minion, MD;  Location: [place];  Service: Orthopedics;  Laterality: Left;  Left foot 1st ray amp  . Amputation  06/21/2012    Procedure: AMPUTATION BELOW KNEE;  Surgeon: Newt Minion, MD;  Location: [place];  Service: Orthopedics;  Laterality: Left;  Left Below Knee Amputation  . Foot amputation through metatarsal Right 06/06/2013    midfoot/notes 06/06/2013  . Joint replacement    . Appendectomy    . Total abdominal hysterectomy  1975  . Cervical disc surgery      "the top vertebrae" (06/06/2013)  . Cataract extraction w/ intraocular lens  implant, bilateral Bilateral   . Eye surgery      bilateral -cataract surgery  . Amputation Right 06/06/2013  Procedure: RIGHT MIDFOOT AMPUTATION;  Surgeon: Newt Minion, MD;  Location: [place];  Service: Orthopedics;  Laterality: Right;    There were no vitals taken for this visit.  Visit Diagnosis:  Weakness generalized  Activity intolerance  Abnormality of gait      Subjective Assessment - 08/10/14 1318    Symptoms Pt denied falls or changes since last visit. Pt reports she has an appt. for R foot casting after PT appt. today.   Patient Stated Goals To walk around assistive living facility.    Currently in Pain? No/denies       Neuro Re-ed: -Standing in parallel bars x5 reps. 1-4 minutes. Min guard to min A to stand from wheelchair and during standing balance activities. PT positioned foot on lateral surface of pt's R foot/PRAFO  to offer additional support and keep foot from rolling out of brace.PT also  tightened straps on brace as it was loose. While standing worked on lateral weight shifting, ant/post. weight shifting and alternating UE reaches to pt's head with cues on posture, improve weight shifting onto L prosthesis, and technique. -Sit<>stand transfers performed in parallel bars x5 with B UEs and min A. VC's to improve ant. weight shifting and to keep feet shoulder width apart vs. together. -Seated balance: pt performed w/c<>mat transfers with sliding board and min guard for safety. Pt reached for bean bags outside BOS with B UEs and then proceeded to toss bags into basket in anterior direction x20. VC's to improve weight shifting onto forearm in order to reach bags that were outside of reach. Performed with min guard-min A during 2 LOB episodes.                        PT Short Term Goals - 07/29/14 1400    PT SHORT TERM GOAL #1   Title donnes prostheses with moderate assist. (Target Date: 08/21/14)   Time 4   Period Weeks   Status On-going   PT SHORT TERM GOAL #2   Title Sit to/from stand w/c to rolling walker with supervision. (Target Date: 08/21/14)   Time 4   Period Weeks   Status On-going   PT SHORT TERM GOAL #3   Title reaches 4" with rolling walker support with supervision.  (Target Date: 08/21/14)   Time 4   Period Weeks   Status On-going   PT SHORT TERM GOAL #4   Title ambulates 20' with rolling walker & prostheses with moderate assistance  (Target Date: 08/21/14)   Time 4   Period Weeks   Status On-going           PT Long Term Goals - 07/29/14 1400    PT LONG TERM GOAL #1   Title tolerates wear of bilateral (TTA & partial foot) prostheses >50% of awake hours.  (Target Date: 09/19/14)   Time 8   Period Weeks   Status On-going   PT LONG TERM GOAL #2   Title verbalizes proper donning, adjusting ply socks, cleaning and adjusting wear schedule.  (Target Date: 09/19/14)   Time 8   Period Weeks   Status On-going   PT LONG TERM GOAL #3   Title stand-pivot  transfer with rolling walker with supervision. (Target Date: 09/19/14)   Time 8   Period Weeks   Status On-going   PT LONG TERM GOAL #4   Title standing balance: reaches 5", rotates head /upper trunk to scan environment & manages pants for toileting with rolling  walker with supervision. (Target Date: 09/19/14)   Time 8   Period Weeks   Status On-going   PT LONG TERM GOAL #5   Title ambulates 62' with rolling walker & prostheses with minimal assist. (Target Date: 09/19/14)   Time 8   Period Weeks   Status On-going               Plan - 08/10/14 1623    Clinical Impression Statement Pt demonstrated progress, as she was able to tolerate x5 reps of standing in parallel bars vs. x2 reps. Pt continues to be limited by decreased endurance, as she required frequent seated rest breaks during session. Continue with POC.   Pt will benefit from skilled therapeutic intervention in order to improve on the following deficits Abnormal gait;Decreased activity tolerance;Decreased balance;Decreased mobility;Decreased knowledge of use of DME;Decreased range of motion;Decreased strength;Postural dysfunction;Other (comment)  prosthetic dependency   Rehab Potential Good   PT Frequency 2x / week   PT Duration 8 weeks   PT Treatment/Interventions ADLs/Self Care Home Management;DME Instruction;Gait training;Stair training;Functional mobility training;Therapeutic activities;Therapeutic exercise;Balance training;Neuromuscular re-education;Other (comment)   PT Next Visit Plan Continue with standing in parallel bars, exercises for posture, seated balance (dynamic) and strenthening.    Consulted and Agree with Plan of Care Patient;Family member/caregiver   Family Member Consulted daughter-Lout        Problem List Patient Active Problem List   Diagnosis Date Noted  . Foot ulcer, right 02/08/2014    Class: Chronic  . PVD (peripheral vascular disease) 02/08/2014    Class: Chronic  . Acute chest pain 02/07/2014   . HCAP (healthcare-associated pneumonia) 11/10/2013  . Pneumonia 11/10/2013  . Acute diastolic heart failure 81/19/1478  . Acute on chronic anemia 06/09/2013  . Acute renal insufficiency 06/07/2013    Class: Acute  . Status post transmetatarsal amputation of right foot 06/07/2013    Class: Acute  . Chest pain 04/29/2013  . Phantom limb pain 07/24/2012  . Diabetic neuropathy 07/24/2012  . Unilateral complete BKA-left 06/24/2012  . Osteomyelitis of toe 01/05/2012  . Osteomyelitis of toe of left foot 01/05/2012  . Chronic osteomyelitis of toe 11/09/2011  . HYPOTHYROIDISM 06/11/2007  . DIABETES MELLITUS, TYPE II 06/11/2007  . HYPERLIPIDEMIA 06/11/2007  . Essential hypertension 06/11/2007  . Atrial fibrillation 06/11/2007  . COPD 06/11/2007    Earland Reish L 08/10/2014, 4:26 PM  [facility] [address] [place], [place], [address] Phone: [phone]   Fax:  [phone]    Geoffry Paradise, PT,DPT 08/10/2014 4:26 PM Phone: [phone] Fax: [phone]

## 2014-08-13 ENCOUNTER — Ambulatory Visit: Payer: Medicare Other

## 2014-08-13 DIAGNOSIS — R269 Unspecified abnormalities of gait and mobility: Secondary | ICD-10-CM

## 2014-08-13 DIAGNOSIS — J449 Chronic obstructive pulmonary disease, unspecified: Secondary | ICD-10-CM | POA: Diagnosis not present

## 2014-08-13 DIAGNOSIS — R6889 Other general symptoms and signs: Secondary | ICD-10-CM | POA: Diagnosis not present

## 2014-08-13 DIAGNOSIS — Z89512 Acquired absence of left leg below knee: Secondary | ICD-10-CM | POA: Diagnosis not present

## 2014-08-13 DIAGNOSIS — R531 Weakness: Secondary | ICD-10-CM | POA: Diagnosis not present

## 2014-08-13 DIAGNOSIS — I482 Chronic atrial fibrillation: Secondary | ICD-10-CM | POA: Diagnosis not present

## 2014-08-13 NOTE — Therapy (Signed)
[facility] [address] [place], [place], [address] Phone: [phone]   Fax:  [phone]  Physical Therapy Treatment  Patient Details  Name: Sydney [name] MRN: [id number] Date of Birth: [birth year]  Encounter Date: 08/13/2014      PT End of Session - 08/13/14 1619    Visit Number 6   Number of Visits 17   Date for PT Re-Evaluation 09/19/13   PT Start Time 1316   PT Stop Time 1401   PT Time Calculation (min) 45 min   Equipment Utilized During Treatment Gait belt   Activity Tolerance Patient tolerated treatment well   Behavior During Therapy [facility] for tasks assessed/performed      Past Medical History  Diagnosis Date  . Lung disease     BRONCHOSPASTIC  . Neuropathy     takes Gabapentin bid  . Hypercholesterolemia     doesn't require meds for this  . Anemia   . COPD (chronic obstructive pulmonary disease)   . Blood transfusion ~ 2012    "once; not related to a surgery or bleeding that I remember" (06/06/2013)  . Hypertension     takes Prinizide daily  . Chronic atrial fibrillation     takes Coumadin as instructed  . Peripheral vascular disease   . CHF (congestive heart failure)     takes Lasix every other day  . Emphysema   . History of head injury     many yrs ago  . Vaginal discharge     has seen GYN but says its nothing to worry about  . Diabetes mellitus     takes Metformin and Amaryl daily  . Hypothyroidism     takes Synthroid daily  . Macular degeneration     dry  . Bronchial pneumonia     "once; several years ago" (06/06/2013)  . Rheumatoid arthritis     "shoulders; daughter gives me shots of Methotrexate q week" (06/06/2013)    Past Surgical History  Procedure Laterality Date  . Total hip arthroplasty Left ?2013  . Back surgery    . Fracture surgery  2011    LEFT HIP FX /REPLACEMENT   . Cholecystectomy  1951  . Dilation and curettage of uterus    . Amputation  11/09/2011     Procedure: AMPUTATION DIGIT;  Surgeon: Mcarthur Rossetti, MD;  Location: [place];  Service: Orthopedics;  Laterality: Right;  Right Great Toe Amputation  . Amputation  01/05/2012    Procedure: AMPUTATION RAY;  Surgeon: Mcarthur Rossetti, MD;  Location[facility];  Service: Orthopedics;  Laterality: Left;  Amputation Left 4th and 5th Toes  . Cardioversion    . Amputation  05/31/2012    Procedure: AMPUTATION RAY;  Surgeon: Newt Minion, MD;  Location: [place];  Service: Orthopedics;  Laterality: Left;  Left foot 1st ray amp  . Amputation  06/21/2012    Procedure: AMPUTATION BELOW KNEE;  Surgeon: Newt Minion, MD;  Location: [place];  Service: Orthopedics;  Laterality: Left;  Left Below Knee Amputation  . Foot amputation through metatarsal Right 06/06/2013    midfoot/notes 06/06/2013  . Joint replacement    . Appendectomy    . Total abdominal hysterectomy  1975  . Cervical disc surgery      "the top vertebrae" (06/06/2013)  . Cataract extraction w/ intraocular lens  implant, bilateral Bilateral   . Eye surgery      bilateral -cataract surgery  . Amputation Right 06/06/2013  Procedure: RIGHT MIDFOOT AMPUTATION;  Surgeon: Newt Minion, MD;  Location: [place];  Service: Orthopedics;  Laterality: Right;    There were no vitals taken for this visit.  Visit Diagnosis:  Weakness generalized  Activity intolerance  Abnormality of gait      Subjective Assessment - 08/13/14 1319    Symptoms Pt denied falls or changes since last visit. Pt reported that the shoe should be ready for next week's appt. Pt reported residual limb has been a little itchy lately.   Patient Stated Goals To walk around assistive living facility.    Currently in Pain? Yes   Pain Score 6    Pain Location Foot   Pain Orientation Right   Pain Descriptors / Indicators Aching   Pain Type Chronic pain   Pain Onset More than a month ago   Pain Frequency Intermittent   Aggravating Factors  the R PRAFO        Neuro  Re-ed: -Standing in parallel bars x3 reps. 1-3 minutes. Min guard to min A during standing balance activities. PT positioned foot on lateral/medial surface of pt's R foot/PRAFO to offer additional support and keep foot from rolling out of brace. PT also tightened straps on brace as it was loose, after each standing bout. While standing pt performed lateral weight shifting, ant/post. weight shifting. VC's to not lean too far in posterior direction during ant/post weight shifting. -Sit<>stand transfers performed in parallel bars x3 with B UEs and min A. VC's to improve ant. weight shifting and to keep feet shoulder width apart vs. together.  Therex: seated with cues for technique. Added to HEP. -B hip marches 3x10 with yellow theraband tied above knees and x10 without band. VCs' to improve eccentric control. -B hip abd. 3x10 with yellow theraband above knees. VC's to improve L hip abd. ROM and technique. -B scapular retraction (to improve posture) with yellow theraband (3x10) and without band (x10). VC's, tactile cues, and demonstration for technique and to decrease shoulder shrug.  Pt required rest breaks due to fatigue and to drink water during session.                  PT Education - 08/13/14 1617    Education provided Yes   Education Details Strengthening HEP. PT also discussed the importance of taking prosthesis off thoughout the day to wipe sweat from residual liimb for skin integrity; as pt reported L LE has been itchy the last few days. PT also suggested rubbing residual limb to reduce phantom limb pain in L foot.   Person(s) Educated Patient;Child(ren)   Methods Explanation;Demonstration;Tactile cues;Verbal cues;Handout   Comprehension Verbalized understanding;Returned demonstration          PT Short Term Goals - 07/29/14 1400    PT SHORT TERM GOAL #1   Title donnes prostheses with moderate assist. (Target Date: 08/21/14)   Time 4   Period Weeks   Status On-going   PT  SHORT TERM GOAL #2   Title Sit to/from stand w/c to rolling walker with supervision. (Target Date: 08/21/14)   Time 4   Period Weeks   Status On-going   PT SHORT TERM GOAL #3   Title reaches 4" with rolling walker support with supervision.  (Target Date: 08/21/14)   Time 4   Period Weeks   Status On-going   PT SHORT TERM GOAL #4   Title ambulates 20' with rolling walker & prostheses with moderate assistance  (Target Date: 08/21/14)   Time 4  Period Weeks   Status On-going           PT Long Term Goals - 07/29/14 1400    PT LONG TERM GOAL #1   Title tolerates wear of bilateral (TTA & partial foot) prostheses >50% of awake hours.  (Target Date: 09/19/14)   Time 8   Period Weeks   Status On-going   PT LONG TERM GOAL #2   Title verbalizes proper donning, adjusting ply socks, cleaning and adjusting wear schedule.  (Target Date: 09/19/14)   Time 8   Period Weeks   Status On-going   PT LONG TERM GOAL #3   Title stand-pivot transfer with rolling walker with supervision. (Target Date: 09/19/14)   Time 8   Period Weeks   Status On-going   PT LONG TERM GOAL #4   Title standing balance: reaches 5", rotates head /upper trunk to scan environment & manages pants for toileting with rolling walker with supervision. (Target Date: 09/19/14)   Time 8   Period Weeks   Status On-going   PT LONG TERM GOAL #5   Title ambulates 3' with rolling walker & prostheses with minimal assist. (Target Date: 09/19/14)   Time 8   Period Weeks   Status On-going               Plan - 08/13/14 1619    Clinical Impression Statement Pt demonstrated progress, as she tolerated resistance to strengthening program well. Pt continues to be limited by decreased endurance as she required rest breaks during session. Pt would continue to benefit from skilled PT to improve safety during functional mobility.   Pt will benefit from skilled therapeutic intervention in order to improve on the following deficits Abnormal  gait;Decreased activity tolerance;Decreased balance;Decreased mobility;Decreased knowledge of use of DME;Decreased range of motion;Decreased strength;Postural dysfunction;Other (comment)  prosthetic dependency   Rehab Potential Good   PT Frequency 2x / week   PT Duration 8 weeks   PT Treatment/Interventions ADLs/Self Care Home Management;DME Instruction;Gait training;Stair training;Functional mobility training;Therapeutic activities;Therapeutic exercise;Balance training;Neuromuscular re-education;Other (comment)  prosthetic training   PT Next Visit Plan Continue with standing in parallel bars, seated dynamic balance activites, and strenthening. Or more standing/ambulation if shoe is present during next session.   PT Home Exercise Plan continue with HEP issued   Consulted and Agree with Plan of Care Patient;Family member/caregiver   Family Member Consulted daughter-Lou        Problem List Patient Active Problem List   Diagnosis Date Noted  . Foot ulcer, right 02/08/2014    Class: Chronic  . PVD (peripheral vascular disease) 02/08/2014    Class: Chronic  . Acute chest pain 02/07/2014  . HCAP (healthcare-associated pneumonia) 11/10/2013  . Pneumonia 11/10/2013  . Acute diastolic heart failure 73/22/0254  . Acute on chronic anemia 06/09/2013  . Acute renal insufficiency 06/07/2013    Class: Acute  . Status post transmetatarsal amputation of right foot 06/07/2013    Class: Acute  . Chest pain 04/29/2013  . Phantom limb pain 07/24/2012  . Diabetic neuropathy 07/24/2012  . Unilateral complete BKA-left 06/24/2012  . Osteomyelitis of toe 01/05/2012  . Osteomyelitis of toe of left foot 01/05/2012  . Chronic osteomyelitis of toe 11/09/2011  . HYPOTHYROIDISM 06/11/2007  . DIABETES MELLITUS, TYPE II 06/11/2007  . HYPERLIPIDEMIA 06/11/2007  . Essential hypertension 06/11/2007  . Atrial fibrillation 06/11/2007  . COPD 06/11/2007    Latrecia Capito L 08/13/2014, 4:25 PM  [facility] [address]  [place], [place], [address] Phone: [phone]   Fax:  [phone]    Geoffry Paradise, PT,DPT 08/13/2014 4:25 PM Phone: [phone] Fax: [phone]

## 2014-08-13 NOTE — Patient Instructions (Signed)
  ABDUCTION: Sitting - Resistance Band (Active)   Sit with feet flat, tie yellow band above knees. Keep feet together and bring knees out to the side. Complete _3__ sets of _10__ repetitions. Perform _1__ sessions per day.  Copyright  VHI. All rights reserved.    2. Shoulder blade squeezes: retraction: Sit up tall, tie yellow theraband around bed post and hold band with both hands, elbows tucked by your sides and then squeeze shoulder blades together. Hold 2 seconds. Relax. Perform 3 sets of 10 reps per day.

## 2014-08-20 ENCOUNTER — Ambulatory Visit: Payer: Medicare Other | Attending: Orthopedic Surgery | Admitting: Physical Therapy

## 2014-08-20 ENCOUNTER — Encounter: Payer: Self-pay | Admitting: Physical Therapy

## 2014-08-20 DIAGNOSIS — E1149 Type 2 diabetes mellitus with other diabetic neurological complication: Secondary | ICD-10-CM | POA: Diagnosis not present

## 2014-08-20 DIAGNOSIS — I509 Heart failure, unspecified: Secondary | ICD-10-CM | POA: Insufficient documentation

## 2014-08-20 DIAGNOSIS — R531 Weakness: Secondary | ICD-10-CM | POA: Insufficient documentation

## 2014-08-20 DIAGNOSIS — Z89512 Acquired absence of left leg below knee: Secondary | ICD-10-CM | POA: Insufficient documentation

## 2014-08-20 DIAGNOSIS — R6889 Other general symptoms and signs: Secondary | ICD-10-CM | POA: Insufficient documentation

## 2014-08-20 DIAGNOSIS — M069 Rheumatoid arthritis, unspecified: Secondary | ICD-10-CM | POA: Diagnosis not present

## 2014-08-20 DIAGNOSIS — E039 Hypothyroidism, unspecified: Secondary | ICD-10-CM | POA: Diagnosis not present

## 2014-08-20 DIAGNOSIS — Z7901 Long term (current) use of anticoagulants: Secondary | ICD-10-CM | POA: Insufficient documentation

## 2014-08-20 DIAGNOSIS — R269 Unspecified abnormalities of gait and mobility: Secondary | ICD-10-CM | POA: Insufficient documentation

## 2014-08-20 DIAGNOSIS — I482 Chronic atrial fibrillation: Secondary | ICD-10-CM | POA: Diagnosis not present

## 2014-08-20 DIAGNOSIS — E119 Type 2 diabetes mellitus without complications: Secondary | ICD-10-CM | POA: Insufficient documentation

## 2014-08-20 DIAGNOSIS — J449 Chronic obstructive pulmonary disease, unspecified: Secondary | ICD-10-CM | POA: Diagnosis not present

## 2014-08-20 DIAGNOSIS — L97411 Non-pressure chronic ulcer of right heel and midfoot limited to breakdown of skin: Secondary | ICD-10-CM | POA: Diagnosis not present

## 2014-08-20 NOTE — Therapy (Signed)
[facility] [address] [place], [place], [address] Phone: [phone]   Fax:  [phone]  Physical Therapy Treatment  Patient Details  Name: Sydney [name] MRN: [id number] Date of Birth: Apr 18, 1921 Referring Provider:  Geoffery Lyons, MD  Encounter Date: 08/20/2014      PT End of Session - 08/20/14 1508    Visit Number 7   Number of Visits 17   Date for PT Re-Evaluation 09/19/13   PT Start Time 1401   PT Stop Time 1441   PT Time Calculation (min) 40 min   Equipment Utilized During Treatment Gait belt   Activity Tolerance Patient tolerated treatment well   Behavior During Therapy [facility] for tasks assessed/performed      Past Medical History  Diagnosis Date  . Lung disease     BRONCHOSPASTIC  . Neuropathy     takes Gabapentin bid  . Hypercholesterolemia     doesn't require meds for this  . Anemia   . COPD (chronic obstructive pulmonary disease)   . Blood transfusion ~ 2012    "once; not related to a surgery or bleeding that I remember" (06/06/2013)  . Hypertension     takes Prinizide daily  . Chronic atrial fibrillation     takes Coumadin as instructed  . Peripheral vascular disease   . CHF (congestive heart failure)     takes Lasix every other day  . Emphysema   . History of head injury     many yrs ago  . Vaginal discharge     has seen GYN but says its nothing to worry about  . Diabetes mellitus     takes Metformin and Amaryl daily  . Hypothyroidism     takes Synthroid daily  . Macular degeneration     dry  . Bronchial pneumonia     "once; several years ago" (06/06/2013)  . Rheumatoid arthritis     "shoulders; daughter gives me shots of Methotrexate q week" (06/06/2013)    Past Surgical History  Procedure Laterality Date  . Total hip arthroplasty Left ?2013  . Back surgery    . Fracture surgery  2011    LEFT HIP FX /REPLACEMENT   . Cholecystectomy  1951  . Dilation and curettage of  uterus    . Amputation  11/09/2011    Procedure: AMPUTATION DIGIT;  Surgeon: Mcarthur Rossetti, MD;  Location: [facility];  Service: Orthopedics;  Laterality: Right;  Right Great Toe Amputation  . Amputation  01/05/2012    Procedure: AMPUTATION RAY;  Surgeon: Mcarthur Rossetti, MD;  Location: [facility];  Service: Orthopedics;  Laterality: Left;  Amputation Left 4th and 5th Toes  . Cardioversion    . Amputation  05/31/2012    Procedure: AMPUTATION RAY;  Surgeon: Newt Minion, MD;  Location: [facility];  Service: Orthopedics;  Laterality: Left;  Left foot 1st ray amp  . Amputation  06/21/2012    Procedure: AMPUTATION BELOW KNEE;  Surgeon: Newt Minion, MD;  Location: [facility];  Service: Orthopedics;  Laterality: Left;  Left Below Knee Amputation  . Foot amputation through metatarsal Right 06/06/2013    midfoot/notes 06/06/2013  . Joint replacement    . Appendectomy    . Total abdominal hysterectomy  1975  . Cervical disc surgery      "the top vertebrae" (06/06/2013)  . Cataract extraction w/ intraocular lens  implant, bilateral Bilateral   . Eye surgery      bilateral -cataract  surgery  . Amputation Right 06/06/2013    Procedure: RIGHT MIDFOOT AMPUTATION;  Surgeon: Newt Minion, MD;  Location: [facility];  Service: Orthopedics;  Laterality: Right;    There were no vitals taken for this visit.  Visit Diagnosis:  Weakness generalized  Activity intolerance  Abnormality of gait  Status post below knee amputation of left lower extremity      Subjective Assessment - 08/20/14 1405    Symptoms Saw Ronalee Belts and got shoe for right foot. Also, saw Dr. Sharol Given. Wound on right heel is a little wider/opened up, so he added to the bandage being used and reordered sivadine to be used with daily dressing changes. Pt was okayed to walk in new shoes by Dr. Sharol Given. No falls to report. Only pain is from where Dr. Sharol Given "dug" in the wound.                  Currently in Pain? Yes   Pain Score 5    Pain Location Heel    Pain Orientation Right   Pain Descriptors / Indicators Aching;Sore   Pain Type Chronic pain   Pain Onset More than a month ago   Pain Frequency Intermittent   Aggravating Factors  wound care, weight bearing   Pain Relieving Factors tylenol          [facility] Adult PT Treatment/Exercise - 08/20/14 1409    Transfers   Sit to Stand 4: Min guard;With upper extremity assist;From chair/3-in-1   Sit to Stand Details (indicate cue type and reason) in parallel bars: x3 reps with cues on technique and sequence   Stand to Sit 4: Min assist;With upper extremity assist;To chair/3-in-1   Stand to Sit Details in parallel bars: cues on techique and hand placement.   Dynamic Standing Balance   Dynamic Standing - Balance Support Bilateral upper extremity supported;During functional activity   Dynamic Standing - Level of Assistance 4: Min assist   Dynamic Standing - Balance Activities Lateral lean/weight shifting;Other (comment);Forward lean/weight shifting  pre gait activities, alternating UE reaches upward   Dynamic Standing - Comments mod cues on posture, to increase stance base of support and to decrease posterior lean   Prosthetics   Prosthetic Care Comments  Pt/daughter given written, specific instructions for wear time of prosthesis. Pt also to keep times on/off in her journal to track if she is wearing the prosthesis as she should be.                                Current prosthetic wear tolerance (days/week)  7 days/wk   Current prosthetic wear tolerance (#hours/day)  Pt unable to state her on/off times and daughter not sure either as the aides at the rest home help her.    Residual limb condition  pt with reddish area on top of thigh and scratched areas. Pt/daughter educated on use of baby oil to decrease heat rash and itching.                Education Provided Proper wear schedule/adjustment;Residual limb care;Other (comment)   Person(s) Educated Patient;Child(ren)   Education Method  Explanation;Demonstration;Verbal cues   Education Method Verbalized understanding;Verbal cues required   Donning Prosthesis Minimal assist;Moderate assist   Doffing Prosthesis Minimal assist            PT Short Term Goals - 07/29/14 1400    PT SHORT TERM GOAL #1   Title donnes prostheses  with moderate assist. (Target Date: 08/21/14)   Time 4   Period Weeks   Status On-going   PT SHORT TERM GOAL #2   Title Sit to/from stand w/c to rolling walker with supervision. (Target Date: 08/21/14)   Time 4   Period Weeks   Status On-going   PT SHORT TERM GOAL #3   Title reaches 4" with rolling walker support with supervision.  (Target Date: 08/21/14)   Time 4   Period Weeks   Status On-going   PT SHORT TERM GOAL #4   Title ambulates 20' with rolling walker & prostheses with moderate assistance  (Target Date: 08/21/14)   Time 4   Period Weeks   Status On-going           PT Long Term Goals - 07/29/14 1400    PT LONG TERM GOAL #1   Title tolerates wear of bilateral (TTA & partial foot) prostheses >50% of awake hours.  (Target Date: 09/19/14)   Time 8   Period Weeks   Status On-going   PT LONG TERM GOAL #2   Title verbalizes proper donning, adjusting ply socks, cleaning and adjusting wear schedule.  (Target Date: 09/19/14)   Time 8   Period Weeks   Status On-going   PT LONG TERM GOAL #3   Title stand-pivot transfer with rolling walker with supervision. (Target Date: 09/19/14)   Time 8   Period Weeks   Status On-going   PT LONG TERM GOAL #4   Title standing balance: reaches 5", rotates head /upper trunk to scan environment & manages pants for toileting with rolling walker with supervision. (Target Date: 09/19/14)   Time 8   Period Weeks   Status On-going   PT LONG TERM GOAL #5   Title ambulates 43' with rolling walker & prostheses with minimal assist. (Target Date: 09/19/14)   Time 8   Period Weeks   Status On-going           Plan - 08/20/14 1509    Pt will benefit from skilled  therapeutic intervention in order to improve on the following deficits Abnormal gait;Decreased activity tolerance;Decreased balance;Decreased mobility;Decreased knowledge of use of DME;Decreased range of motion;Decreased strength;Postural dysfunction;Other (comment)  prosthetic dependency   Rehab Potential Good   PT Frequency 2x / week   PT Duration 8 weeks   PT Treatment/Interventions ADLs/Self Care Home Management;DME Instruction;Gait training;Stair training;Functional mobility training;Therapeutic activities;Therapeutic exercise;Balance training;Neuromuscular re-education;Other (comment)  prosthetic training   PT Next Visit Plan Continue with standing in parallel bars and gait in parallel bars. Issue sink HEP if not already done. Progress to gait with RW as able. Assess STG's next session.   PT Home Exercise Plan continue with HEP issued   Consulted and Agree with Plan of Care Patient;Family member/caregiver   Family Member Consulted daughter-Lou      Problem List Patient Active Problem List   Diagnosis Date Noted  . Foot ulcer, right 02/08/2014    Class: Chronic  . PVD (peripheral vascular disease) 02/08/2014    Class: Chronic  . Acute chest pain 02/07/2014  . HCAP (healthcare-associated pneumonia) 11/10/2013  . Pneumonia 11/10/2013  . Acute diastolic heart failure 94/17/4081  . Acute on chronic anemia 06/09/2013  . Acute renal insufficiency 06/07/2013    Class: Acute  . Status post transmetatarsal amputation of right foot 06/07/2013    Class: Acute  . Chest pain 04/29/2013  . Phantom limb pain 07/24/2012  . Diabetic neuropathy 07/24/2012  . Unilateral complete BKA-left 06/24/2012  .  Osteomyelitis of toe 01/05/2012  . Osteomyelitis of toe of left foot 01/05/2012  . Chronic osteomyelitis of toe 11/09/2011  . HYPOTHYROIDISM 06/11/2007  . DIABETES MELLITUS, TYPE II 06/11/2007  . HYPERLIPIDEMIA 06/11/2007  . Essential hypertension 06/11/2007  . Atrial fibrillation 06/11/2007   . COPD 06/11/2007    Willow Ora 08/20/2014, 3:11 PM  Willow Ora, PTA, [facility] [address] [place], [place] [address] [phone] 08/20/2014, 3:12 PM

## 2014-08-20 NOTE — Patient Instructions (Signed)
Prosthesis Wear Time: Don prosthesis on in am after bath. Wear for 3 hours. Remove it and clean skin. Keep off 1-2 hours. Reapply prosthesis for 3 hours. Repeat this pattern during all awake hours.  Skin care on left limb: Apply lotion to whole leg at night before going to sleep. Apply baby oil to left leg ABOVE KNEE only before donning liner and prosthesis to decrease heat rash/iching skin. Do not put below knee or liner will slide off.

## 2014-08-21 ENCOUNTER — Ambulatory Visit: Payer: Medicare Other | Admitting: Physical Therapy

## 2014-08-21 DIAGNOSIS — I482 Chronic atrial fibrillation: Secondary | ICD-10-CM | POA: Diagnosis not present

## 2014-08-21 DIAGNOSIS — R269 Unspecified abnormalities of gait and mobility: Secondary | ICD-10-CM

## 2014-08-21 DIAGNOSIS — R6889 Other general symptoms and signs: Secondary | ICD-10-CM

## 2014-08-21 DIAGNOSIS — J449 Chronic obstructive pulmonary disease, unspecified: Secondary | ICD-10-CM | POA: Diagnosis not present

## 2014-08-21 DIAGNOSIS — R531 Weakness: Secondary | ICD-10-CM | POA: Diagnosis not present

## 2014-08-21 DIAGNOSIS — Z89512 Acquired absence of left leg below knee: Secondary | ICD-10-CM

## 2014-08-21 NOTE — Therapy (Signed)
[facility] [address] [place], [place], [address] Phone: [phone]   Fax:  [phone]  Physical Therapy Treatment  Patient Details  Name: Sydney [name] MRN: [id number] Date of Birth: 1920-10-20 Referring Provider:  Minda Meo, MD  Encounter Date: 08/21/2014      PT End of Session - 08/21/14 1322    Visit Number 8   Number of Visits 17   Date for PT Re-Evaluation 09/19/13   PT Start Time 1316   PT Stop Time 1401   PT Time Calculation (min) 45 min   Equipment Utilized During Treatment Gait belt   Activity Tolerance Patient tolerated treatment well   Behavior During Therapy [facility] for tasks assessed/performed      Past Medical History  Diagnosis Date  . Lung disease     BRONCHOSPASTIC  . Neuropathy     takes Gabapentin bid  . Hypercholesterolemia     doesn't require meds for this  . Anemia   . COPD (chronic obstructive pulmonary disease)   . Blood transfusion ~ 2012    "once; not related to a surgery or bleeding that I remember" (06/06/2013)  . Hypertension     takes Prinizide daily  . Chronic atrial fibrillation     takes Coumadin as instructed  . Peripheral vascular disease   . CHF (congestive heart failure)     takes Lasix every other day  . Emphysema   . History of head injury     many yrs ago  . Vaginal discharge     has seen GYN but says its nothing to worry about  . Diabetes mellitus     takes Metformin and Amaryl daily  . Hypothyroidism     takes Synthroid daily  . Macular degeneration     dry  . Bronchial pneumonia     "once; several years ago" (06/06/2013)  . Rheumatoid arthritis     "shoulders; daughter gives me shots of Methotrexate q week" (06/06/2013)    Past Surgical History  Procedure Laterality Date  . Total hip arthroplasty Left ?2013  . Back surgery    . Fracture surgery  2011    LEFT HIP FX /REPLACEMENT   . Cholecystectomy  1951  . Dilation and curettage of  uterus    . Amputation  11/09/2011    Procedure: AMPUTATION DIGIT;  Surgeon: Kathryne Hitch, MD;  [facility];  Service: Orthopedics;  Laterality: Right;  Right Great Toe Amputation  . Amputation  01/05/2012    Procedure: AMPUTATION RAY;  Surgeon: Kathryne Hitch, MD;  Location: [facility];  Service: Orthopedics;  Laterality: Left;  Amputation Left 4th and 5th Toes  . Cardioversion    . Amputation  05/31/2012    Procedure: AMPUTATION RAY;  Surgeon: Nadara Mustard, MD;  Location: [facility];  Service: Orthopedics;  Laterality: Left;  Left foot 1st ray amp  . Amputation  06/21/2012    Procedure: AMPUTATION BELOW KNEE;  Surgeon: Nadara Mustard, MD;  Location: [facility];  Service: Orthopedics;  Laterality: Left;  Left Below Knee Amputation  . Foot amputation through metatarsal Right 06/06/2013    midfoot/notes 06/06/2013  . Joint replacement    . Appendectomy    . Total abdominal hysterectomy  1975  . Cervical disc surgery      "the top vertebrae" (06/06/2013)  . Cataract extraction w/ intraocular lens  implant, bilateral Bilateral   . Eye surgery      bilateral -cataract  surgery  . Amputation Right 06/06/2013    Procedure: RIGHT MIDFOOT AMPUTATION;  Surgeon: Newt Minion, MD;  Location: [facility];  Service: Orthopedics;  Laterality: Right;    There were no vitals taken for this visit.  Visit Diagnosis:  Abnormality of gait  Activity intolerance  Weakness generalized  Status post below knee amputation of left lower extremity      Subjective Assessment - 08/21/14 1319    Symptoms No new complaints. No falls to report.   Currently in Pain? Yes   Pain Score 5    Pain Location Foot   Pain Orientation Right;Lateral   Pain Descriptors / Indicators Sore;Aching   Pain Type Chronic pain   Pain Onset More than a month ago   Pain Frequency Intermittent   Aggravating Factors  wound care, weight bearing, wearing shoe a long time   Pain Relieving Factors tylenol           [facility] Adult  PT Treatment/Exercise - 08/21/14 1321    Transfers   Sit to Stand 4: Min assist;3: Mod assist;With armrests;With upper extremity assist;From chair/3-in-1;From bed   Sit to Stand Details (indicate cue type and reason) cues on technique/hand placement/anterior weight shift to stand. cues on bil foot placement with standing. increased assist needed from lower mat table and as repetitions progressed                   x 6-7 reps   Stand to Sit 4: Min assist;3: Mod assist;With upper extremity assist;With armrests;To chair/3-in-1;To bed   Stand to Sit Details cues to reach back and use arms to control desent with sitting down. Increased assist needed as session progressed. with sitting after gait trials pt needed up to max assist to sit safety and cues for hip/knee/trunk flexion to sit due to significant posterior lean.                      Squat Pivot Transfers 4: Min assist;3: Mod assist   Squat Pivot Transfer Details (indicate cue type and reason) practiced various techniques: with RW w/c to/from mat x 2 and squat pivot without RW x2 (1 w/c to/from mat and 1 w/c to car). Pt needed increased assist with transfering without RW due to reports of pain on residual limb. cues on technique to pt and daughter.                   Ambulation/Gait   Ambulation/Gait Yes   Ambulation/Gait Assistance 3: Mod assist;2: Max assist   Ambulation/Gait Assistance Details with new shoe on right foot/prosthesis/RW. Max cues on posture, anterior weight shift as pt has significant posterior lean. If allowed to feel her lean (not held up by therapist) pt would right herself and decrease the posterior lean. cues on step length, to increase base of support and on walker/foot position with gait.                                Ambulation Distance (Feet) 16 Feet  x1, 20 ft x 1   Assistive device Rolling walker   Gait Pattern Step-to pattern;Decreased step length - left;Decreased stance time - right;Trunk flexed;Narrow base of support    Gait velocity decreased   Prosthetics   Current prosthetic wear tolerance (days/week)  7 days/wk   Current prosthetic wear tolerance (#hours/day)  working on 3 hours 2 x day after written instructions from yesterday  Education Provided Correct ply sock adjustment;Proper wear schedule/adjustment;Residual limb care   Person(s) Educated Patient;Child(ren)   Education Method Explanation;Verbal cues;Demonstration   Education Method Verbalized understanding   Donning Prosthesis Minimal assist;Moderate assist   Doffing Prosthesis Minimal assist            PT Short Term Goals - 08/21/14 1323    PT SHORT TERM GOAL #1   Title donnes prostheses with moderate assist. (Target Date: 08/21/14)   Time 4   Period Weeks   Status Achieved   PT SHORT TERM GOAL #2   Title Sit to/from stand w/c to rolling walker with supervision. (Target Date: 08/21/14)   Time 4   Period Weeks   Status Not Met   PT SHORT TERM GOAL #3   Title reaches 4" with rolling walker support with supervision.  (Target Date: 08/21/14)   Time 4   Period Weeks   Status On-going   PT SHORT TERM GOAL #4   Title ambulates 20' with rolling walker & prostheses with moderate assistance  (Target Date: 08/21/14)   Baseline 08/21/2014- met the distance portion, needed up to max assist with cues   Time 4   Period Weeks   Status Partially Met           PT Long Term Goals - 07/29/14 1400    PT LONG TERM GOAL #1   Title tolerates wear of bilateral (TTA & partial foot) prostheses >50% of awake hours.  (Target Date: 09/19/14)   Time 8   Period Weeks   Status On-going   PT LONG TERM GOAL #2   Title verbalizes proper donning, adjusting ply socks, cleaning and adjusting wear schedule.  (Target Date: 09/19/14)   Time 8   Period Weeks   Status On-going   PT LONG TERM GOAL #3   Title stand-pivot transfer with rolling walker with supervision. (Target Date: 09/19/14)   Time 8   Period Weeks   Status On-going   PT LONG TERM GOAL #4   Title  standing balance: reaches 5", rotates head /upper trunk to scan environment & manages pants for toileting with rolling walker with supervision. (Target Date: 09/19/14)   Time 8   Period Weeks   Status On-going   PT LONG TERM GOAL #5   Title ambulates 73' with rolling walker & prostheses with minimal assist. (Target Date: 09/19/14)   Time 8   Period Weeks   Status On-going           Plan - 08/21/14 1323    Clinical Impression Statement STG's were checked today with limited number met as this was only the pt's second visist since recieving her shoe for her right foot. Pt is progressing well toward goals in this brief time.                     Pt will benefit from skilled therapeutic intervention in order to improve on the following deficits Abnormal gait;Decreased activity tolerance;Decreased balance;Decreased mobility;Decreased knowledge of use of DME;Decreased range of motion;Decreased strength;Postural dysfunction;Other (comment)  prosthetic dependency   Rehab Potential Good   PT Frequency 2x / week   PT Duration 8 weeks   PT Treatment/Interventions ADLs/Self Care Home Management;DME Instruction;Gait training;Stair training;Functional mobility training;Therapeutic activities;Therapeutic exercise;Balance training;Neuromuscular re-education;Other (comment)  prosthetic training   PT Next Visit Plan  Assess remaining  STG's next session. Continue with transfer and gait training. Try gait in parllel bars to decrease pt's fear of falling and posterior lean.  PT Home Exercise Plan continue with HEP issued   Consulted and Agree with Plan of Care Patient;Family member/caregiver   Family Member Consulted daughter-Lou        Problem List Patient Active Problem List   Diagnosis Date Noted  . Foot ulcer, right 02/08/2014    Class: Chronic  . PVD (peripheral vascular disease) 02/08/2014    Class: Chronic  . Acute chest pain 02/07/2014  . HCAP (healthcare-associated pneumonia) 11/10/2013  .  Pneumonia 11/10/2013  . Acute diastolic heart failure 78/29/5621  . Acute on chronic anemia 06/09/2013  . Acute renal insufficiency 06/07/2013    Class: Acute  . Status post transmetatarsal amputation of right foot 06/07/2013    Class: Acute  . Chest pain 04/29/2013  . Phantom limb pain 07/24/2012  . Diabetic neuropathy 07/24/2012  . Unilateral complete BKA-left 06/24/2012  . Osteomyelitis of toe 01/05/2012  . Osteomyelitis of toe of left foot 01/05/2012  . Chronic osteomyelitis of toe 11/09/2011  . HYPOTHYROIDISM 06/11/2007  . DIABETES MELLITUS, TYPE II 06/11/2007  . HYPERLIPIDEMIA 06/11/2007  . Essential hypertension 06/11/2007  . Atrial fibrillation 06/11/2007  . COPD 06/11/2007    Willow Ora 08/21/2014, 4:41 PM  Willow Ora, PTA, [facility] [address] [place], [place] [address] [phone] 08/21/2014, 4:42 PM

## 2014-08-24 ENCOUNTER — Ambulatory Visit: Payer: Medicare Other | Admitting: Physical Therapy

## 2014-08-24 ENCOUNTER — Encounter: Payer: Medicare Other | Admitting: Physical Therapy

## 2014-08-24 DIAGNOSIS — L97411 Non-pressure chronic ulcer of right heel and midfoot limited to breakdown of skin: Secondary | ICD-10-CM | POA: Diagnosis not present

## 2014-08-24 DIAGNOSIS — N390 Urinary tract infection, site not specified: Secondary | ICD-10-CM | POA: Diagnosis not present

## 2014-08-24 DIAGNOSIS — Z89512 Acquired absence of left leg below knee: Secondary | ICD-10-CM | POA: Diagnosis not present

## 2014-08-24 DIAGNOSIS — E1149 Type 2 diabetes mellitus with other diabetic neurological complication: Secondary | ICD-10-CM | POA: Diagnosis not present

## 2014-08-26 ENCOUNTER — Encounter: Payer: Medicare Other | Admitting: Physical Therapy

## 2014-08-27 ENCOUNTER — Telehealth: Payer: Self-pay | Admitting: Cardiology

## 2014-08-27 NOTE — Telephone Encounter (Signed)
Pt saw Dr Stanford Breed on 11-20, he did not order lab work.The reason he did not ,was she had lab work the day before at another doctor.Did he ever get lab results? If so,does she need more lab work or that was satisfactory?

## 2014-08-27 NOTE — Telephone Encounter (Signed)
Spoke with pt dtr, we got the labs we needed from the PCP.

## 2014-08-31 ENCOUNTER — Ambulatory Visit: Payer: Medicare Other | Admitting: Physical Therapy

## 2014-09-02 ENCOUNTER — Encounter: Payer: Medicare Other | Admitting: Physical Therapy

## 2014-09-07 ENCOUNTER — Encounter: Payer: Medicare Other | Admitting: Physical Therapy

## 2014-09-07 DIAGNOSIS — I739 Peripheral vascular disease, unspecified: Secondary | ICD-10-CM | POA: Diagnosis not present

## 2014-09-07 DIAGNOSIS — I482 Chronic atrial fibrillation: Secondary | ICD-10-CM | POA: Diagnosis not present

## 2014-09-07 DIAGNOSIS — M4806 Spinal stenosis, lumbar region: Secondary | ICD-10-CM | POA: Diagnosis not present

## 2014-09-07 DIAGNOSIS — Z89512 Acquired absence of left leg below knee: Secondary | ICD-10-CM | POA: Diagnosis not present

## 2014-09-07 DIAGNOSIS — J449 Chronic obstructive pulmonary disease, unspecified: Secondary | ICD-10-CM | POA: Diagnosis not present

## 2014-09-07 DIAGNOSIS — E1151 Type 2 diabetes mellitus with diabetic peripheral angiopathy without gangrene: Secondary | ICD-10-CM | POA: Diagnosis not present

## 2014-09-07 DIAGNOSIS — E11621 Type 2 diabetes mellitus with foot ulcer: Secondary | ICD-10-CM | POA: Diagnosis not present

## 2014-09-07 DIAGNOSIS — Z7901 Long term (current) use of anticoagulants: Secondary | ICD-10-CM | POA: Diagnosis not present

## 2014-09-09 ENCOUNTER — Encounter: Payer: Medicare Other | Admitting: Physical Therapy

## 2014-09-14 ENCOUNTER — Encounter: Payer: Medicare Other | Admitting: Physical Therapy

## 2014-09-16 ENCOUNTER — Encounter: Payer: Medicare Other | Admitting: Physical Therapy

## 2014-09-19 IMAGING — CT CT HEAD W/O CM
1 of 2 series · 16 of 30 positions shown, 20 images · non-contrast
Comparison: CT HEAD W/O CM dated 06/12/2010; MR HEAD WO/W CM dated
12/01/2007

CLINICAL DATA: Altered mental status, fever, shortness of breath
and confusion.

EXAM:
CT HEAD WITHOUT CONTRAST
TECHNIQUE: Contiguous axial images were obtained from the base of the skull
through the vertex without intravenous contrast.

[Series 3: head 5.0 h30s · axial · 0.44mm/px · z∈[-93,+37]mm · 16 of 30 slices shown, 20 images]
[im 2/30  brain]
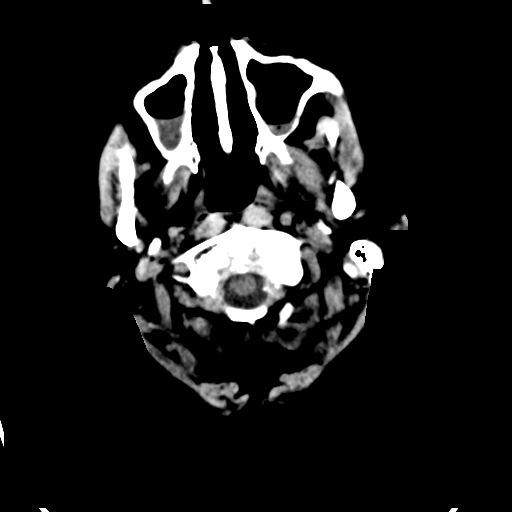
[im 2/30  bone]
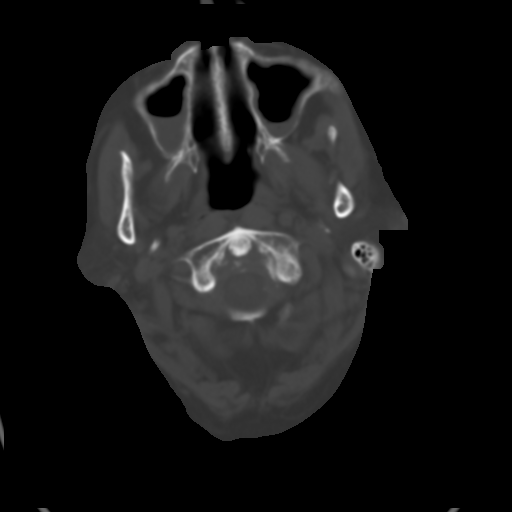
[im 3/30  brain]
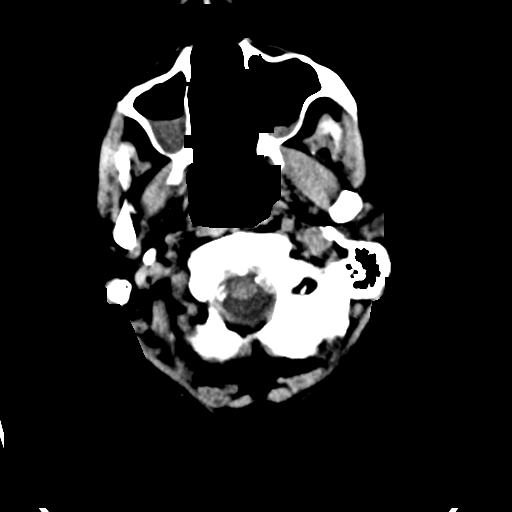
[im 6/30  brain]
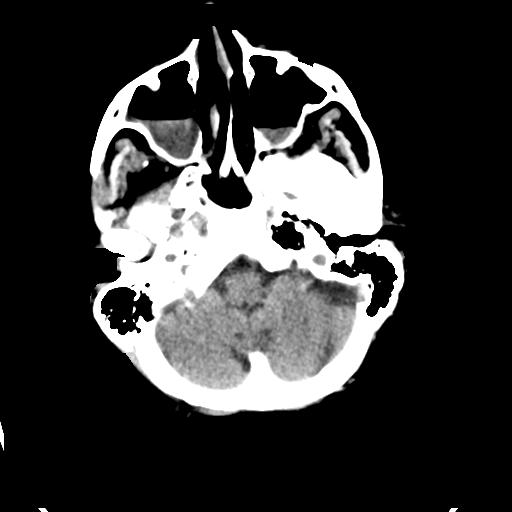
[im 7/30  brain]
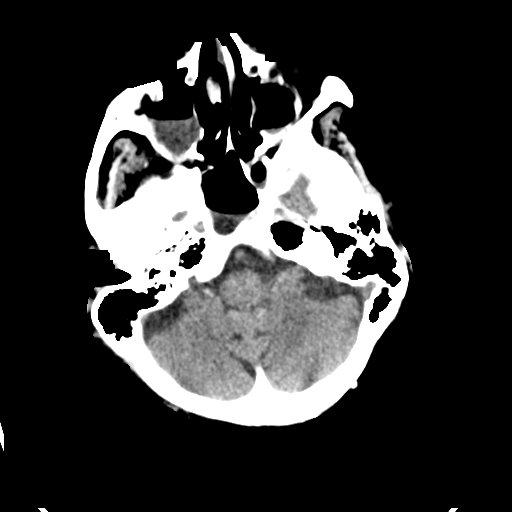
[im 8/30  brain]
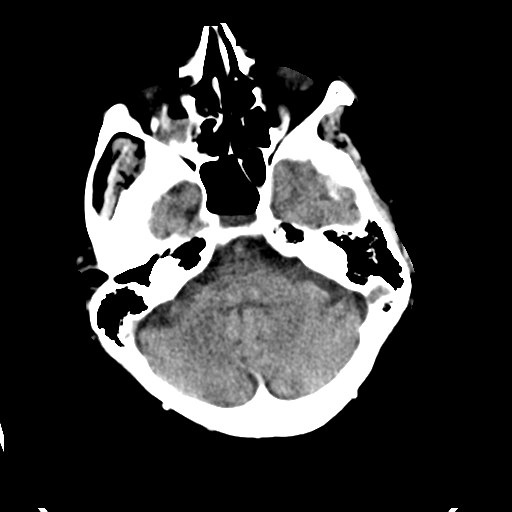
[im 8/30  bone]
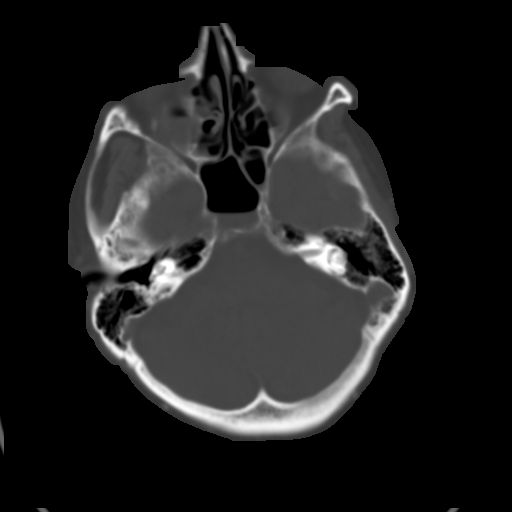
[im 11/30  brain]
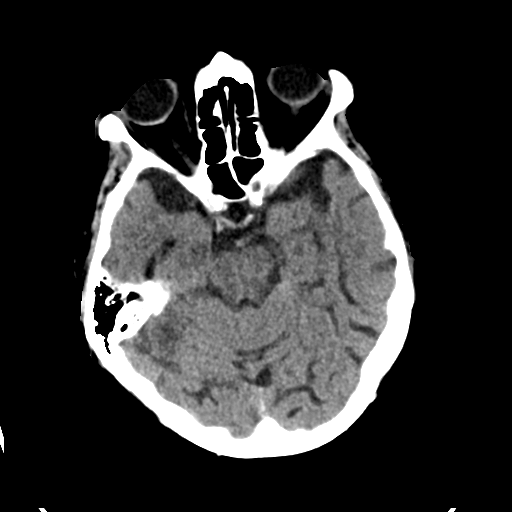
[im 12/30  brain]
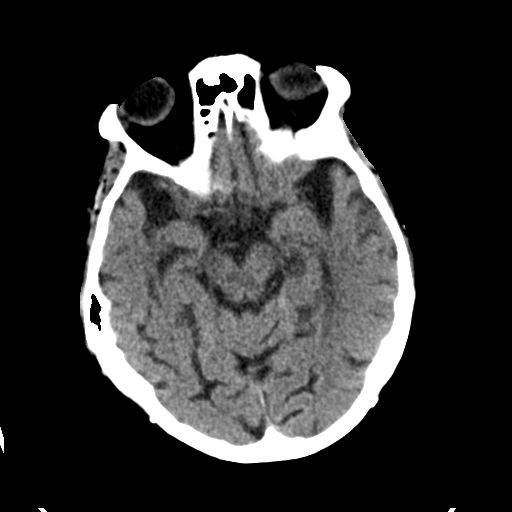
[im 14/30  brain]
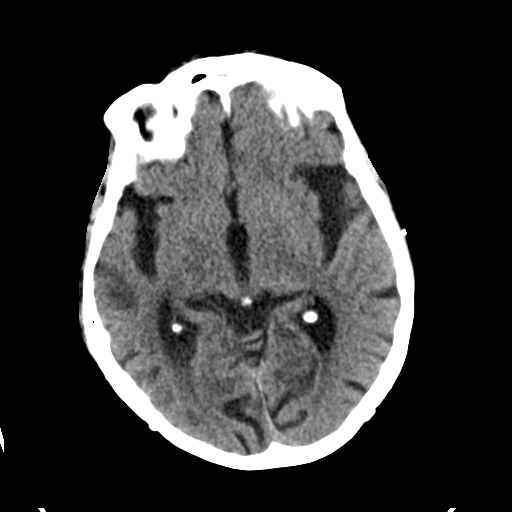
[im 16/30  brain]
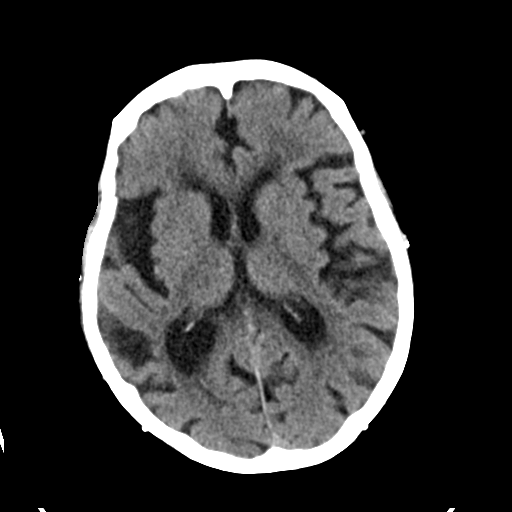
[im 16/30  bone]
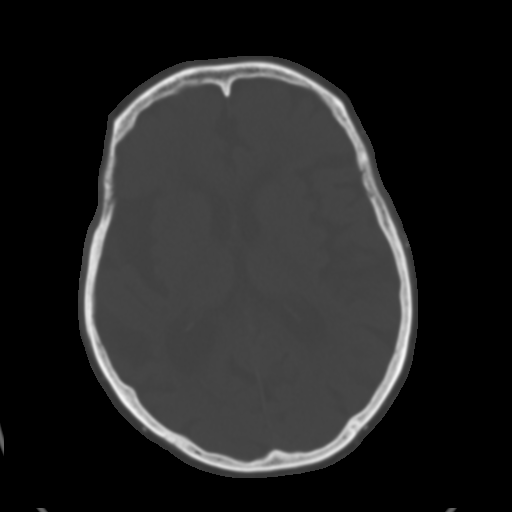
[im 18/30  brain]
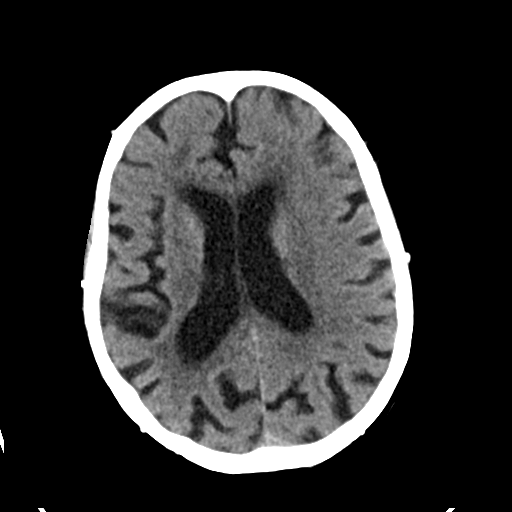
[im 19/30  brain]
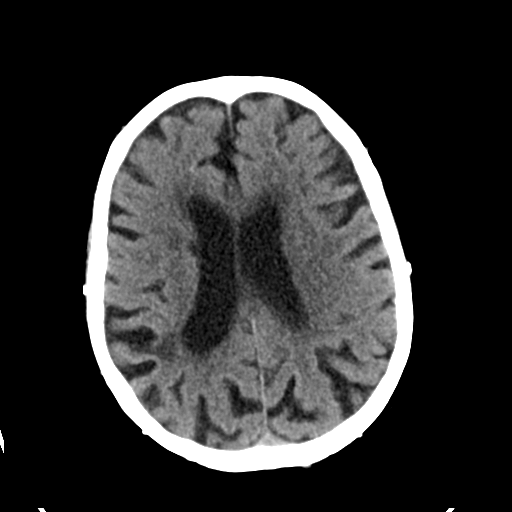
[im 22/30  brain]
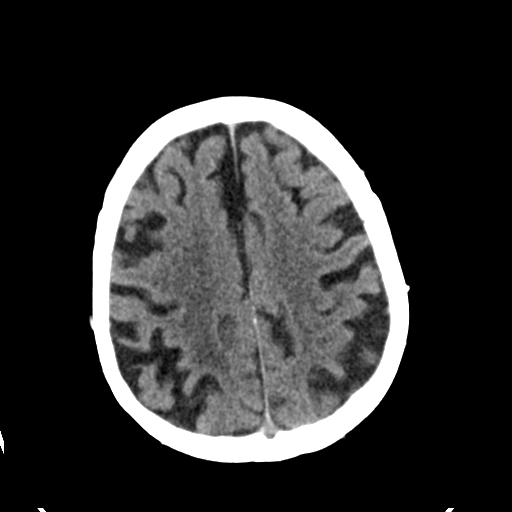
[im 23/30  brain]
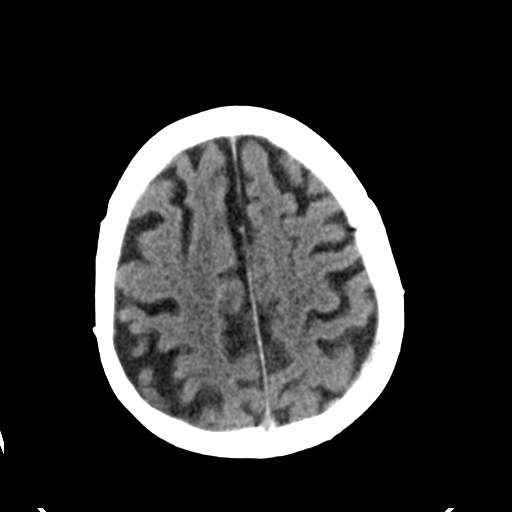
[im 23/30  bone]
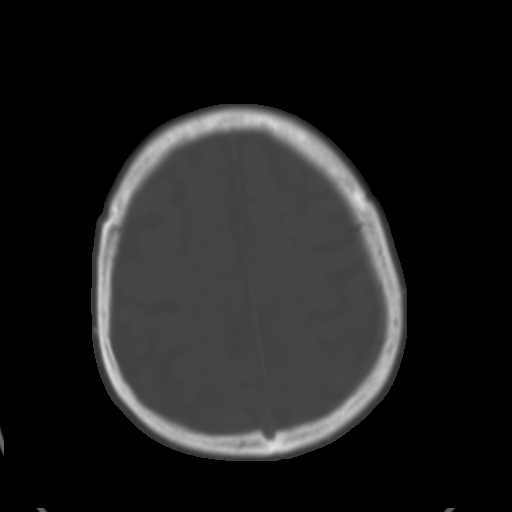
[im 24/30  brain]
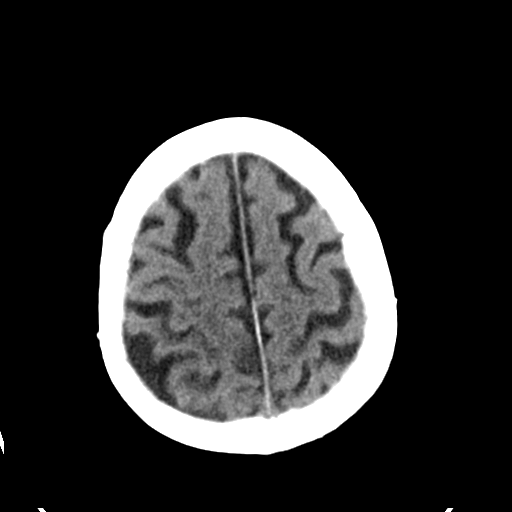
[im 27/30  brain]
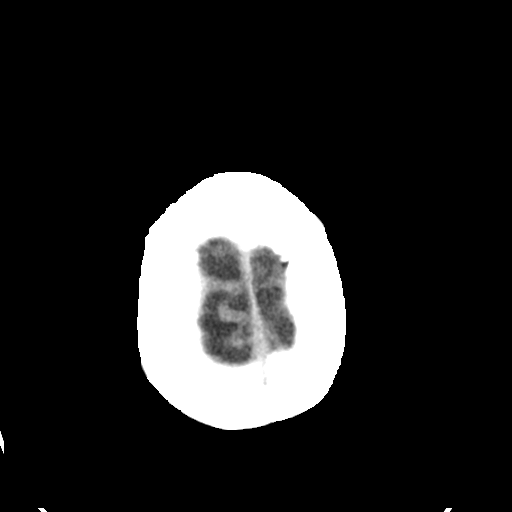
[im 28/30  brain]
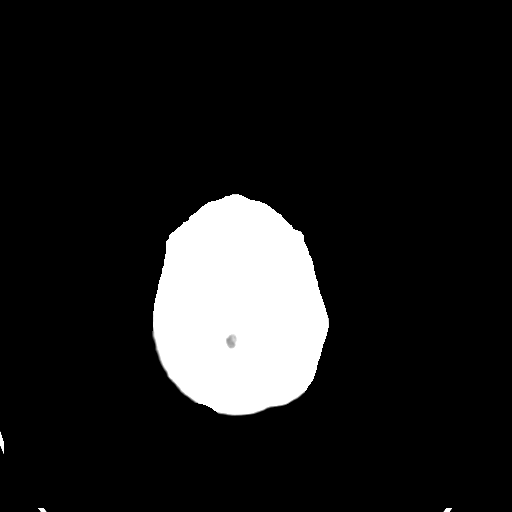

[16 of 30 positions shown; findings below may reference images not displayed]

FINDINGS: There is diffuse cortical atrophy which shows worsening since 7633.
Moderately advanced small vessel ischemic changes are seen in the
periventricular white matter which have progressed significantly
since 7633. The brain demonstrates no evidence of hemorrhage, acute
infarction, edema, mass effect, extra-axial fluid collection,
hydrocephalus or mass lesion. The skull is unremarkable.
IMPRESSION: Progressive cortical atrophy and small vessel disease since 7633. No
acute findings are identified by head CT.

## 2014-09-21 ENCOUNTER — Ambulatory Visit: Payer: Medicare Other | Admitting: Physical Therapy

## 2014-09-21 DIAGNOSIS — E1149 Type 2 diabetes mellitus with other diabetic neurological complication: Secondary | ICD-10-CM | POA: Diagnosis not present

## 2014-09-21 DIAGNOSIS — Z89512 Acquired absence of left leg below knee: Secondary | ICD-10-CM | POA: Diagnosis not present

## 2014-09-21 DIAGNOSIS — L97411 Non-pressure chronic ulcer of right heel and midfoot limited to breakdown of skin: Secondary | ICD-10-CM | POA: Diagnosis not present

## 2014-10-01 DIAGNOSIS — M25512 Pain in left shoulder: Secondary | ICD-10-CM | POA: Diagnosis not present

## 2014-10-01 DIAGNOSIS — M069 Rheumatoid arthritis, unspecified: Secondary | ICD-10-CM | POA: Diagnosis not present

## 2014-10-01 DIAGNOSIS — M25511 Pain in right shoulder: Secondary | ICD-10-CM | POA: Diagnosis not present

## 2014-10-01 DIAGNOSIS — Z79899 Other long term (current) drug therapy: Secondary | ICD-10-CM | POA: Diagnosis not present

## 2014-10-08 DIAGNOSIS — A047 Enterocolitis due to Clostridium difficile: Secondary | ICD-10-CM | POA: Diagnosis not present

## 2014-10-08 DIAGNOSIS — Z110 Encounter for screening for intestinal infectious diseases: Secondary | ICD-10-CM | POA: Diagnosis not present

## 2014-10-22 DIAGNOSIS — E1149 Type 2 diabetes mellitus with other diabetic neurological complication: Secondary | ICD-10-CM | POA: Diagnosis not present

## 2014-10-22 DIAGNOSIS — L97411 Non-pressure chronic ulcer of right heel and midfoot limited to breakdown of skin: Secondary | ICD-10-CM | POA: Diagnosis not present

## 2014-10-22 DIAGNOSIS — Z89512 Acquired absence of left leg below knee: Secondary | ICD-10-CM | POA: Diagnosis not present

## 2014-10-25 DIAGNOSIS — A047 Enterocolitis due to Clostridium difficile: Secondary | ICD-10-CM | POA: Diagnosis not present

## 2014-11-23 DIAGNOSIS — Z89512 Acquired absence of left leg below knee: Secondary | ICD-10-CM | POA: Diagnosis not present

## 2014-11-23 DIAGNOSIS — E1149 Type 2 diabetes mellitus with other diabetic neurological complication: Secondary | ICD-10-CM | POA: Diagnosis not present

## 2014-11-23 DIAGNOSIS — L97411 Non-pressure chronic ulcer of right heel and midfoot limited to breakdown of skin: Secondary | ICD-10-CM | POA: Diagnosis not present

## 2014-12-02 DIAGNOSIS — M7541 Impingement syndrome of right shoulder: Secondary | ICD-10-CM | POA: Diagnosis not present

## 2014-12-02 DIAGNOSIS — M25511 Pain in right shoulder: Secondary | ICD-10-CM | POA: Diagnosis not present

## 2014-12-02 DIAGNOSIS — M7542 Impingement syndrome of left shoulder: Secondary | ICD-10-CM | POA: Diagnosis not present

## 2014-12-02 DIAGNOSIS — M069 Rheumatoid arthritis, unspecified: Secondary | ICD-10-CM | POA: Diagnosis not present

## 2014-12-02 DIAGNOSIS — M25512 Pain in left shoulder: Secondary | ICD-10-CM | POA: Diagnosis not present

## 2014-12-21 DIAGNOSIS — L97411 Non-pressure chronic ulcer of right heel and midfoot limited to breakdown of skin: Secondary | ICD-10-CM | POA: Diagnosis not present

## 2014-12-21 DIAGNOSIS — E1149 Type 2 diabetes mellitus with other diabetic neurological complication: Secondary | ICD-10-CM | POA: Diagnosis not present

## 2014-12-21 DIAGNOSIS — Z89512 Acquired absence of left leg below knee: Secondary | ICD-10-CM | POA: Diagnosis not present

## 2014-12-29 NOTE — Progress Notes (Signed)
HPI: FU permanent atrial fibrillation. Myoview in June of 2008 showed normal LV function with an ejection fraction of 63% and normal perfusion. Holter monitor in February of 2010 showed A. fib with controlled ventricular response. Patient also has peripheral vascular disease. ABIs in January of 2013 showed greater than 50% bilateral SFA stenosis. Echocardiogram in November 2014 showed normal LV function, mild aortic insufficiency and mitral regurgitation and mild right atrial enlargement. Patient previously had chest pain but declined further workup requesting only conservative measures. Since she was last seen, she denies dyspnea, chest pain, palpitations or syncope.  Current Outpatient Prescriptions  Medication Sig Dispense Refill  . acetaminophen (TYLENOL) 500 MG tablet Take 500 mg by mouth 3 (three) times daily. Take 2 tablets three times each day.    . Calcium-Vitamin D-Vitamin K ([employer] CALCIUM SOFT CHEWS PO) Take 1 tablet by mouth 2 (two) times daily.     . cyanocobalamin 500 MCG tablet Take 500 mcg by mouth daily.    . diclofenac sodium (VOLTAREN) 1 % GEL Apply 2 g topically 4 (four) times daily.    Marland Kitchen diltiazem (CARDIZEM) 60 MG tablet Take 60 mg by mouth 2 (two) times daily.    . folic acid (FOLVITE) 1 MG tablet Take 1 mg by mouth daily.    . furosemide (LASIX) 20 MG tablet Take 40 mg by mouth daily.    Marland Kitchen gabapentin (NEURONTIN) 300 MG capsule Take 300 mg by mouth 2 (two) times daily.    . insulin lispro (HUMALOG) 100 UNIT/ML KiwkPen Inject 100 Units into the skin 2 (two) times daily.    . iron polysaccharides (NIFEREX) 150 MG capsule Take 150 mg by mouth daily.    Marland Kitchen levothyroxine (SYNTHROID, LEVOTHROID) 50 MCG tablet Take 50 mcg by mouth daily with breakfast.     . methotrexate (RHEUMATREX) 2.5 MG tablet Take 0.4 mg by mouth once a week. Monday.    . Multiple Vitamin (MULITIVITAMIN WITH MINERALS) TABS Take 1 tablet by mouth daily.    . Multiple Vitamins-Minerals (ICAPS PO) Take 1 drop  by mouth daily.    . nitroGLYCERIN (NITROSTAT) 0.4 MG SL tablet Place 1 tablet (0.4 mg total) under the tongue every 5 (five) minutes as needed for chest pain (CP or SOB). 25 tablet 12  . pantoprazole (PROTONIX) 40 MG tablet Take 1 tablet (40 mg total) by mouth daily. 30 tablet 11  . potassium chloride SA (K-DUR,KLOR-CON) 20 MEQ tablet Take 20 mEq by mouth 2 (two) times daily.    . protein supplement (RESOURCE BENEPROTEIN) POWD Take 1 scoop by mouth 3 (three) times daily with meals.     . tiotropium (SPIRIVA) 18 MCG inhalation capsule Place 18 mcg into inhaler and inhale daily with breakfast.     . vitamin C (ASCORBIC ACID) 500 MG tablet Take 500 mg by mouth daily.    Marland Kitchen warfarin (COUMADIN) 2 MG tablet Take 1 mg by mouth daily. Monday, Wednesday, and Friday.    . warfarin (COUMADIN) 3 MG tablet Take 2 mg by mouth daily. Tuesday and Thursday.     No current facility-administered medications for this visit.     Past Medical History  Diagnosis Date  . Lung disease     BRONCHOSPASTIC  . Neuropathy     takes Gabapentin bid  . Hypercholesterolemia     doesn't require meds for this  . Anemia   . COPD (chronic obstructive pulmonary disease)   . Blood transfusion ~ 2012    "  once; not related to a surgery or bleeding that I remember" (06/06/2013)  . Hypertension     takes Prinizide daily  . Chronic atrial fibrillation     takes Coumadin as instructed  . Peripheral vascular disease   . CHF (congestive heart failure)     takes Lasix every other day  . Emphysema   . History of head injury     many yrs ago  . Vaginal discharge     has seen GYN but says its nothing to worry about  . Diabetes mellitus     takes Metformin and Amaryl daily  . Hypothyroidism     takes Synthroid daily  . Macular degeneration     dry  . Bronchial pneumonia     "once; several years ago" (06/06/2013)  . Rheumatoid arthritis     "shoulders; daughter gives me shots of Methotrexate q week" (06/06/2013)    Past  Surgical History  Procedure Laterality Date  . Total hip arthroplasty Left ?2013  . Back surgery    . Fracture surgery  2011    LEFT HIP FX /REPLACEMENT   . Cholecystectomy  1951  . Dilation and curettage of uterus    . Amputation  11/09/2011    Procedure: AMPUTATION DIGIT;  Surgeon: Mcarthur Rossetti, MD;  Location: [facility];  Service: Orthopedics;  Laterality: Right;  Right Great Toe Amputation  . Amputation  01/05/2012    Procedure: AMPUTATION RAY;  Surgeon: Mcarthur Rossetti, MD;  Location: [facility];  Service: Orthopedics;  Laterality: Left;  Amputation Left 4th and 5th Toes  . Cardioversion    . Amputation  05/31/2012    Procedure: AMPUTATION RAY;  Surgeon: Newt Minion, MD;  Location: [facility];  Service: Orthopedics;  Laterality: Left;  Left foot 1st ray amp  . Amputation  06/21/2012    Procedure: AMPUTATION BELOW KNEE;  Surgeon: Newt Minion, MD;  Location: [facility];  Service: Orthopedics;  Laterality: Left;  Left Below Knee Amputation  . Foot amputation through metatarsal Right 06/06/2013    midfoot/notes 06/06/2013  . Joint replacement    . Appendectomy    . Total abdominal hysterectomy  1975  . Cervical disc surgery      "the top vertebrae" (06/06/2013)  . Cataract extraction w/ intraocular lens  implant, bilateral Bilateral   . Eye surgery      bilateral -cataract surgery  . Amputation Right 06/06/2013    Procedure: RIGHT MIDFOOT AMPUTATION;  Surgeon: Newt Minion, MD;  Location: [facility];  Service: Orthopedics;  Laterality: Right;    History   Social History  . Marital Status: Widowed    Spouse Name: N/A  . Number of Children: N/A  . Years of Education: N/A   Occupational History  . Not on file.   Social History Main Topics  . Smoking status: Former Smoker -- 4 years    Types: Cigarettes  . Smokeless tobacco: Never Used     Comment: 06/06/2013 "smoked a little in my teens"  . Alcohol Use: No  . Drug Use: No  . Sexual Activity: No   Other Topics Concern  .  Not on file   Social History Narrative    ROS: no fevers or chills, productive cough, hemoptysis, dysphasia, odynophagia, melena, hematochezia, dysuria, hematuria, rash, seizure activity, orthopnea, PND, pedal edema, claudication. Remaining systems are negative.  Physical Exam: Well-developed well-nourished in no acute distress.  Skin is warm and dry.  HEENT is normal.  Neck is  supple.  Chest is clear to auscultation with normal expansion.  Cardiovascular exam is irregular Abdominal exam nontender or distended. No masses palpated. Extremities s/p left amputation. Right lower extremity wrapped. neuro grossly intact  ECG atrial fibrillation at a rate of 68. Cannot rule out prior septal infarct. No ST changes.

## 2015-01-01 ENCOUNTER — Ambulatory Visit (INDEPENDENT_AMBULATORY_CARE_PROVIDER_SITE_OTHER): Payer: Medicare Other | Admitting: Cardiology

## 2015-01-01 ENCOUNTER — Encounter: Payer: Self-pay | Admitting: Cardiology

## 2015-01-01 VITALS — BP 134/70 | HR 68 | Ht 64.0 in | Wt 137.0 lb

## 2015-01-01 DIAGNOSIS — I10 Essential (primary) hypertension: Secondary | ICD-10-CM

## 2015-01-01 DIAGNOSIS — I5031 Acute diastolic (congestive) heart failure: Secondary | ICD-10-CM | POA: Diagnosis not present

## 2015-01-01 DIAGNOSIS — I482 Chronic atrial fibrillation, unspecified: Secondary | ICD-10-CM

## 2015-01-01 NOTE — Patient Instructions (Signed)
Your physician wants you to follow-up in: 6 Months You will receive a reminder letter in the mail two months in advance. If you don't receive a letter, please call our office to schedule the follow-up appointment.  

## 2015-01-01 NOTE — Assessment & Plan Note (Signed)
Management per primary care. 

## 2015-01-01 NOTE — Assessment & Plan Note (Signed)
Continue present dose of Lasix. Potassium and renal function monitored by primary care. 

## 2015-01-01 NOTE — Assessment & Plan Note (Signed)
Patient remains in permanent atrial fibrillation. Continue Cardizem and Coumadin. Hemoglobin monitored by primary care.

## 2015-01-01 NOTE — Assessment & Plan Note (Signed)
Blood pressure controlled. Continue present medication. 

## 2015-01-06 DIAGNOSIS — E1151 Type 2 diabetes mellitus with diabetic peripheral angiopathy without gangrene: Secondary | ICD-10-CM | POA: Diagnosis not present

## 2015-01-06 DIAGNOSIS — J449 Chronic obstructive pulmonary disease, unspecified: Secondary | ICD-10-CM | POA: Diagnosis not present

## 2015-01-06 DIAGNOSIS — I482 Chronic atrial fibrillation: Secondary | ICD-10-CM | POA: Diagnosis not present

## 2015-01-06 DIAGNOSIS — Z7901 Long term (current) use of anticoagulants: Secondary | ICD-10-CM | POA: Diagnosis not present

## 2015-01-06 DIAGNOSIS — I509 Heart failure, unspecified: Secondary | ICD-10-CM | POA: Diagnosis not present

## 2015-01-06 DIAGNOSIS — R197 Diarrhea, unspecified: Secondary | ICD-10-CM | POA: Diagnosis not present

## 2015-01-06 DIAGNOSIS — Z1389 Encounter for screening for other disorder: Secondary | ICD-10-CM | POA: Diagnosis not present

## 2015-01-06 DIAGNOSIS — Z794 Long term (current) use of insulin: Secondary | ICD-10-CM | POA: Diagnosis not present

## 2015-01-06 DIAGNOSIS — I10 Essential (primary) hypertension: Secondary | ICD-10-CM | POA: Diagnosis not present

## 2015-01-06 DIAGNOSIS — D649 Anemia, unspecified: Secondary | ICD-10-CM | POA: Diagnosis not present

## 2015-01-06 NOTE — Addendum Note (Signed)
Addended by: Diana Eves on: 01/06/2015 02:49 PM   Modules accepted: Orders

## 2015-01-28 ENCOUNTER — Encounter: Payer: Self-pay | Admitting: Cardiology

## 2015-02-02 DIAGNOSIS — L97411 Non-pressure chronic ulcer of right heel and midfoot limited to breakdown of skin: Secondary | ICD-10-CM | POA: Diagnosis not present

## 2015-02-02 DIAGNOSIS — E1149 Type 2 diabetes mellitus with other diabetic neurological complication: Secondary | ICD-10-CM | POA: Diagnosis not present

## 2015-02-02 DIAGNOSIS — Z89512 Acquired absence of left leg below knee: Secondary | ICD-10-CM | POA: Diagnosis not present

## 2015-02-08 DIAGNOSIS — R262 Difficulty in walking, not elsewhere classified: Secondary | ICD-10-CM | POA: Diagnosis not present

## 2015-02-10 DIAGNOSIS — R262 Difficulty in walking, not elsewhere classified: Secondary | ICD-10-CM | POA: Diagnosis not present

## 2015-02-12 DIAGNOSIS — R262 Difficulty in walking, not elsewhere classified: Secondary | ICD-10-CM | POA: Diagnosis not present

## 2015-02-17 DIAGNOSIS — R262 Difficulty in walking, not elsewhere classified: Secondary | ICD-10-CM | POA: Diagnosis not present

## 2015-02-19 DIAGNOSIS — J811 Chronic pulmonary edema: Secondary | ICD-10-CM | POA: Diagnosis not present

## 2015-02-19 DIAGNOSIS — R05 Cough: Secondary | ICD-10-CM | POA: Diagnosis not present

## 2015-02-24 DIAGNOSIS — R262 Difficulty in walking, not elsewhere classified: Secondary | ICD-10-CM | POA: Diagnosis not present

## 2015-02-26 DIAGNOSIS — R262 Difficulty in walking, not elsewhere classified: Secondary | ICD-10-CM | POA: Diagnosis not present

## 2015-03-01 DIAGNOSIS — R262 Difficulty in walking, not elsewhere classified: Secondary | ICD-10-CM | POA: Diagnosis not present

## 2015-03-02 DIAGNOSIS — Z79899 Other long term (current) drug therapy: Secondary | ICD-10-CM | POA: Diagnosis not present

## 2015-03-02 DIAGNOSIS — M069 Rheumatoid arthritis, unspecified: Secondary | ICD-10-CM | POA: Diagnosis not present

## 2015-03-03 DIAGNOSIS — R262 Difficulty in walking, not elsewhere classified: Secondary | ICD-10-CM | POA: Diagnosis not present

## 2015-03-05 DIAGNOSIS — R262 Difficulty in walking, not elsewhere classified: Secondary | ICD-10-CM | POA: Diagnosis not present

## 2015-03-08 DIAGNOSIS — R262 Difficulty in walking, not elsewhere classified: Secondary | ICD-10-CM | POA: Diagnosis not present

## 2015-03-10 DIAGNOSIS — R262 Difficulty in walking, not elsewhere classified: Secondary | ICD-10-CM | POA: Diagnosis not present

## 2015-03-12 DIAGNOSIS — R262 Difficulty in walking, not elsewhere classified: Secondary | ICD-10-CM | POA: Diagnosis not present

## 2015-03-15 DIAGNOSIS — R2689 Other abnormalities of gait and mobility: Secondary | ICD-10-CM | POA: Diagnosis not present

## 2015-03-15 DIAGNOSIS — R262 Difficulty in walking, not elsewhere classified: Secondary | ICD-10-CM | POA: Diagnosis not present

## 2015-03-17 DIAGNOSIS — R262 Difficulty in walking, not elsewhere classified: Secondary | ICD-10-CM | POA: Diagnosis not present

## 2015-03-17 DIAGNOSIS — R2689 Other abnormalities of gait and mobility: Secondary | ICD-10-CM | POA: Diagnosis not present

## 2015-03-19 DIAGNOSIS — R262 Difficulty in walking, not elsewhere classified: Secondary | ICD-10-CM | POA: Diagnosis not present

## 2015-03-19 DIAGNOSIS — R2689 Other abnormalities of gait and mobility: Secondary | ICD-10-CM | POA: Diagnosis not present

## 2015-03-22 DIAGNOSIS — R2689 Other abnormalities of gait and mobility: Secondary | ICD-10-CM | POA: Diagnosis not present

## 2015-03-22 DIAGNOSIS — R262 Difficulty in walking, not elsewhere classified: Secondary | ICD-10-CM | POA: Diagnosis not present

## 2015-03-24 DIAGNOSIS — R262 Difficulty in walking, not elsewhere classified: Secondary | ICD-10-CM | POA: Diagnosis not present

## 2015-03-24 DIAGNOSIS — R2689 Other abnormalities of gait and mobility: Secondary | ICD-10-CM | POA: Diagnosis not present

## 2015-03-26 DIAGNOSIS — R262 Difficulty in walking, not elsewhere classified: Secondary | ICD-10-CM | POA: Diagnosis not present

## 2015-03-26 DIAGNOSIS — R2689 Other abnormalities of gait and mobility: Secondary | ICD-10-CM | POA: Diagnosis not present

## 2015-03-29 DIAGNOSIS — R2689 Other abnormalities of gait and mobility: Secondary | ICD-10-CM | POA: Diagnosis not present

## 2015-03-29 DIAGNOSIS — R262 Difficulty in walking, not elsewhere classified: Secondary | ICD-10-CM | POA: Diagnosis not present

## 2015-03-31 DIAGNOSIS — R262 Difficulty in walking, not elsewhere classified: Secondary | ICD-10-CM | POA: Diagnosis not present

## 2015-03-31 DIAGNOSIS — R2689 Other abnormalities of gait and mobility: Secondary | ICD-10-CM | POA: Diagnosis not present

## 2015-04-02 DIAGNOSIS — R2689 Other abnormalities of gait and mobility: Secondary | ICD-10-CM | POA: Diagnosis not present

## 2015-04-02 DIAGNOSIS — R262 Difficulty in walking, not elsewhere classified: Secondary | ICD-10-CM | POA: Diagnosis not present

## 2015-04-05 DIAGNOSIS — R2689 Other abnormalities of gait and mobility: Secondary | ICD-10-CM | POA: Diagnosis not present

## 2015-04-05 DIAGNOSIS — R262 Difficulty in walking, not elsewhere classified: Secondary | ICD-10-CM | POA: Diagnosis not present

## 2015-04-07 DIAGNOSIS — R2689 Other abnormalities of gait and mobility: Secondary | ICD-10-CM | POA: Diagnosis not present

## 2015-04-07 DIAGNOSIS — R262 Difficulty in walking, not elsewhere classified: Secondary | ICD-10-CM | POA: Diagnosis not present

## 2015-05-17 DIAGNOSIS — I739 Peripheral vascular disease, unspecified: Secondary | ICD-10-CM | POA: Diagnosis not present

## 2015-05-17 DIAGNOSIS — I10 Essential (primary) hypertension: Secondary | ICD-10-CM | POA: Diagnosis not present

## 2015-05-17 DIAGNOSIS — Z794 Long term (current) use of insulin: Secondary | ICD-10-CM | POA: Diagnosis not present

## 2015-05-17 DIAGNOSIS — I482 Chronic atrial fibrillation: Secondary | ICD-10-CM | POA: Diagnosis not present

## 2015-05-17 DIAGNOSIS — I509 Heart failure, unspecified: Secondary | ICD-10-CM | POA: Diagnosis not present

## 2015-05-17 DIAGNOSIS — D649 Anemia, unspecified: Secondary | ICD-10-CM | POA: Diagnosis not present

## 2015-05-17 DIAGNOSIS — E1151 Type 2 diabetes mellitus with diabetic peripheral angiopathy without gangrene: Secondary | ICD-10-CM | POA: Diagnosis not present

## 2015-05-17 DIAGNOSIS — Z7901 Long term (current) use of anticoagulants: Secondary | ICD-10-CM | POA: Diagnosis not present

## 2015-06-01 DIAGNOSIS — Z79899 Other long term (current) drug therapy: Secondary | ICD-10-CM | POA: Diagnosis not present

## 2015-06-01 DIAGNOSIS — M150 Primary generalized (osteo)arthritis: Secondary | ICD-10-CM | POA: Diagnosis not present

## 2015-06-01 DIAGNOSIS — M0579 Rheumatoid arthritis with rheumatoid factor of multiple sites without organ or systems involvement: Secondary | ICD-10-CM | POA: Diagnosis not present

## 2015-06-11 DIAGNOSIS — L820 Inflamed seborrheic keratosis: Secondary | ICD-10-CM | POA: Diagnosis not present

## 2015-06-11 DIAGNOSIS — Z89512 Acquired absence of left leg below knee: Secondary | ICD-10-CM | POA: Diagnosis not present

## 2015-06-11 DIAGNOSIS — Z6825 Body mass index (BMI) 25.0-25.9, adult: Secondary | ICD-10-CM | POA: Diagnosis not present

## 2015-06-11 DIAGNOSIS — Z5189 Encounter for other specified aftercare: Secondary | ICD-10-CM | POA: Diagnosis not present

## 2015-06-30 DIAGNOSIS — H43813 Vitreous degeneration, bilateral: Secondary | ICD-10-CM | POA: Diagnosis not present

## 2015-06-30 DIAGNOSIS — H353134 Nonexudative age-related macular degeneration, bilateral, advanced atrophic with subfoveal involvement: Secondary | ICD-10-CM | POA: Diagnosis not present

## 2015-07-06 ENCOUNTER — Encounter: Payer: Self-pay | Admitting: Cardiology

## 2015-07-06 NOTE — Progress Notes (Signed)
HPI: FU permanent atrial fibrillation. Myoview in June of 2008 showed normal LV function with an ejection fraction of 63% and normal perfusion. Holter monitor in February of 2010 showed A. fib with controlled ventricular response. Patient also has peripheral vascular disease. ABIs in January of 2013 showed greater than 50% bilateral SFA stenosis. Echocardiogram in November 2014 showed normal LV function, mild aortic insufficiency and mitral regurgitation and mild right atrial enlargement. Patient previously had chest pain but declined further workup requesting only conservative measures. Since she was last seen, There is no dyspnea, palpitations or syncope. Occasional brief pain in chest but not sustained.  Current Outpatient Prescriptions  Medication Sig Dispense Refill  . acetaminophen (TYLENOL) 500 MG tablet Take 500 mg by mouth every 8 (eight) hours as needed for mild pain. Take 2 tablets three times each day.    . Calcium-Vitamin D-Vitamin K ([employer] CALCIUM SOFT CHEWS PO) Take 1 tablet by mouth 2 (two) times daily.     . cyanocobalamin 500 MCG tablet Take 500 mcg by mouth daily.    . diclofenac sodium (VOLTAREN) 1 % GEL Apply 2 g topically 4 (four) times daily.    Marland Kitchen diltiazem (CARDIZEM) 60 MG tablet Take 60 mg by mouth 2 (two) times daily.    . folic acid (FOLVITE) 1 MG tablet Take 1 mg by mouth daily.    . furosemide (LASIX) 20 MG tablet Take 40 mg by mouth daily.    Marland Kitchen gabapentin (NEURONTIN) 300 MG capsule Take 300 mg by mouth 2 (two) times daily.    . insulin lispro (HUMALOG) 100 UNIT/ML KiwkPen Inject 100 Units into the skin 2 (two) times daily.    . iron polysaccharides (NIFEREX) 150 MG capsule Take 150 mg by mouth daily.    Marland Kitchen levothyroxine (SYNTHROID, LEVOTHROID) 50 MCG tablet Take 50 mcg by mouth daily with breakfast.     . loperamide (IMODIUM) 2 MG capsule Take 2 mg by mouth daily as needed. diarrhea  0  . metFORMIN (GLUCOPHAGE) 500 MG tablet Take 500 mg by mouth daily.  5  .  methotrexate 50 MG/2ML injection Inject 0.4 mLs into the muscle once a week.  3  . Multiple Vitamin (MULITIVITAMIN WITH MINERALS) TABS Take 1 tablet by mouth daily.    . Multiple Vitamins-Minerals (ICAPS PO) Take 1 drop by mouth daily.    . mupirocin ointment (BACTROBAN) 2 % Apply 1 application topically daily.  97  . nitroGLYCERIN (NITRODUR - DOSED IN MG/24 HR) 0.2 mg/hr patch Place 1 patch onto the skin daily.  10  . pantoprazole (PROTONIX) 40 MG tablet Take 1 tablet (40 mg total) by mouth daily. 30 tablet 11  . potassium chloride SA (K-DUR,KLOR-CON) 20 MEQ tablet Take 20 mEq by mouth 2 (two) times daily.    . protein supplement (RESOURCE BENEPROTEIN) POWD Take 1 scoop by mouth 3 (three) times daily with meals.     . tiotropium (SPIRIVA) 18 MCG inhalation capsule Place 18 mcg into inhaler and inhale daily with breakfast.     . triamcinolone cream (KENALOG) 0.1 % Apply 1 application topically daily.  98  . vitamin C (ASCORBIC ACID) 500 MG tablet Take 500 mg by mouth daily.    Marland Kitchen warfarin (COUMADIN) 2 MG tablet Take 1 mg by mouth daily. Monday, Wednesday, and Friday.    . warfarin (COUMADIN) 3 MG tablet Take 2 mg by mouth daily. Tuesday and Thursday.     No current facility-administered medications for this visit.  Past Medical History  Diagnosis Date  . Lung disease     BRONCHOSPASTIC  . Neuropathy (HCC)     takes Gabapentin bid  . Hypercholesterolemia     doesn't require meds for this  . Anemia   . COPD (chronic obstructive pulmonary disease) ([place])   . Blood transfusion ~ 2012    "once; not related to a surgery or bleeding that I remember" (06/06/2013)  . Hypertension     takes Prinizide daily  . Chronic atrial fibrillation (HCC)     takes Coumadin as instructed  . Peripheral vascular disease ([place])   . CHF (congestive heart failure) (HCC)     takes Lasix every other day  . Emphysema   . History of head injury     many yrs ago  . Vaginal discharge     has seen GYN but says  its nothing to worry about  . Diabetes mellitus     takes Metformin and Amaryl daily  . Hypothyroidism     takes Synthroid daily  . Macular degeneration     dry  . Bronchial pneumonia     "once; several years ago" (06/06/2013)  . Rheumatoid arthritis ([place])     "shoulders; daughter gives me shots of Methotrexate q week" (06/06/2013)    Past Surgical History  Procedure Laterality Date  . Total hip arthroplasty Left ?2013  . Back surgery    . Fracture surgery  2011    LEFT HIP FX /REPLACEMENT   . Cholecystectomy  1951  . Dilation and curettage of uterus    . Amputation  11/09/2011    Procedure: AMPUTATION DIGIT;  Surgeon: Mcarthur Rossetti, MD;  Location: [facility];  Service: Orthopedics;  Laterality: Right;  Right Great Toe Amputation  . Amputation  01/05/2012    Procedure: AMPUTATION RAY;  Surgeon: Mcarthur Rossetti, MD;  Location: [facility];  Service: Orthopedics;  Laterality: Left;  Amputation Left 4th and 5th Toes  . Cardioversion    . Amputation  05/31/2012    Procedure: AMPUTATION RAY;  Surgeon: Newt Minion, MD;  Location: [facility];  Service: Orthopedics;  Laterality: Left;  Left foot 1st ray amp  . Amputation  06/21/2012    Procedure: AMPUTATION BELOW KNEE;  Surgeon: Newt Minion, MD;  Location: [facility];  Service: Orthopedics;  Laterality: Left;  Left Below Knee Amputation  . Foot amputation through metatarsal Right 06/06/2013    midfoot/notes 06/06/2013  . Joint replacement    . Appendectomy    . Total abdominal hysterectomy  1975  . Cervical disc surgery      "the top vertebrae" (06/06/2013)  . Cataract extraction w/ intraocular lens  implant, bilateral Bilateral   . Eye surgery      bilateral -cataract surgery  . Amputation Right 06/06/2013    Procedure: RIGHT MIDFOOT AMPUTATION;  Surgeon: Newt Minion, MD;  Location: [facility];  Service: Orthopedics;  Laterality: Right;    Social History   Social History  . Marital Status: Widowed    Spouse Name: N/A  . Number of  Children: N/A  . Years of Education: N/A   Occupational History  . Not on file.   Social History Main Topics  . Smoking status: Former Smoker -- 4 years    Types: Cigarettes  . Smokeless tobacco: Never Used     Comment: 06/06/2013 "smoked a little in my teens"  . Alcohol Use: No  . Drug Use: No  . Sexual Activity: No  Other Topics Concern  . Not on file   Social History Narrative    ROS: no fevers or chills, productive cough, hemoptysis, dysphasia, odynophagia, melena, hematochezia, dysuria, hematuria, rash, seizure activity, orthopnea, PND, pedal edema, claudication. Remaining systems are negative.  Physical Exam: Well-developed well-nourished in no acute distress.  Skin is warm and dry.  HEENT is normal.  Neck is supple.  Chest is clear to auscultation with normal expansion.  Cardiovascular exam is irregular Abdominal exam nontender or distended. No masses palpated. Extremities Status post amputation neuro grossly intact  ECG Atrial fibrillation at a rate of 71. Cannot rule out prior septal infarct. Nonspecific ST changes.

## 2015-07-16 ENCOUNTER — Ambulatory Visit (INDEPENDENT_AMBULATORY_CARE_PROVIDER_SITE_OTHER): Payer: Medicare Other | Admitting: Cardiology

## 2015-07-16 ENCOUNTER — Encounter: Payer: Self-pay | Admitting: Cardiology

## 2015-07-16 VITALS — BP 120/58 | HR 71 | Ht 64.0 in | Wt 148.0 lb

## 2015-07-16 DIAGNOSIS — I5031 Acute diastolic (congestive) heart failure: Secondary | ICD-10-CM

## 2015-07-16 DIAGNOSIS — I482 Chronic atrial fibrillation, unspecified: Secondary | ICD-10-CM

## 2015-07-16 DIAGNOSIS — I10 Essential (primary) hypertension: Secondary | ICD-10-CM | POA: Diagnosis not present

## 2015-07-16 NOTE — Assessment & Plan Note (Signed)
Management per primary care. 

## 2015-07-16 NOTE — Assessment & Plan Note (Signed)
Blood pressure controlled. Continue present medications. 

## 2015-07-16 NOTE — Assessment & Plan Note (Signed)
Patient appears to be euvolemic on examination. Continue present dose of diuretics. Potassium and renal function monitored by primary care.

## 2015-07-16 NOTE — Assessment & Plan Note (Signed)
Patient remains in permanent atrial fibrillation. Continue Cardizem and Coumadin. Hemoglobin monitored by primary care. 

## 2015-07-16 NOTE — Patient Instructions (Signed)
Your physician wants you to follow-up in: ONE YEAR WITH DR CRENSHAW You will receive a reminder letter in the mail two months in advance. If you don't receive a letter, please call our office to schedule the follow-up appointment.  

## 2015-09-02 DIAGNOSIS — Z79899 Other long term (current) drug therapy: Secondary | ICD-10-CM | POA: Diagnosis not present

## 2015-09-02 DIAGNOSIS — M0579 Rheumatoid arthritis with rheumatoid factor of multiple sites without organ or systems involvement: Secondary | ICD-10-CM | POA: Diagnosis not present

## 2015-09-02 DIAGNOSIS — M150 Primary generalized (osteo)arthritis: Secondary | ICD-10-CM | POA: Diagnosis not present

## 2015-09-20 DIAGNOSIS — E039 Hypothyroidism, unspecified: Secondary | ICD-10-CM | POA: Diagnosis not present

## 2015-09-20 DIAGNOSIS — E1151 Type 2 diabetes mellitus with diabetic peripheral angiopathy without gangrene: Secondary | ICD-10-CM | POA: Diagnosis not present

## 2015-09-20 DIAGNOSIS — I10 Essential (primary) hypertension: Secondary | ICD-10-CM | POA: Diagnosis not present

## 2015-09-20 DIAGNOSIS — I509 Heart failure, unspecified: Secondary | ICD-10-CM | POA: Diagnosis not present

## 2015-09-20 DIAGNOSIS — D649 Anemia, unspecified: Secondary | ICD-10-CM | POA: Diagnosis not present

## 2015-09-20 DIAGNOSIS — J449 Chronic obstructive pulmonary disease, unspecified: Secondary | ICD-10-CM | POA: Diagnosis not present

## 2015-09-23 ENCOUNTER — Encounter: Payer: Self-pay | Admitting: Physical Therapy

## 2015-09-23 NOTE — Therapy (Signed)
[facility] [address] [place], [place], [address] Phone: [phone]   Fax:  [phone]  Patient Details  Name: Sydney [name] MRN: [id number] Date of Birth: 1921-04-18 Referring Provider:  Meridee Score, MD  Encounter Date: 09/23/2015  PHYSICAL THERAPY DISCHARGE SUMMARY  Visits from Start of Care: 8  Current functional level related to goals / functional outcomes: Unknown patient was placed on hold due to medical issues and did not return.    Remaining deficits: Unknown   Education / Equipment: Prosthetic care  Plan:                                                    Patient goals were not met. Patient is being discharged due to a change in medical status.  ?????       Teodor Prater PT, DPT 09/23/2015, 1:48 PM  [facility] [address] [place], [place], [address] Phone: [phone]   Fax:  [phone]

## 2015-09-24 DIAGNOSIS — I739 Peripheral vascular disease, unspecified: Secondary | ICD-10-CM | POA: Diagnosis not present

## 2015-09-24 DIAGNOSIS — Z6825 Body mass index (BMI) 25.0-25.9, adult: Secondary | ICD-10-CM | POA: Diagnosis not present

## 2015-09-24 DIAGNOSIS — Z8673 Personal history of transient ischemic attack (TIA), and cerebral infarction without residual deficits: Secondary | ICD-10-CM | POA: Diagnosis not present

## 2015-09-24 DIAGNOSIS — E1151 Type 2 diabetes mellitus with diabetic peripheral angiopathy without gangrene: Secondary | ICD-10-CM | POA: Diagnosis not present

## 2015-09-24 DIAGNOSIS — D649 Anemia, unspecified: Secondary | ICD-10-CM | POA: Diagnosis not present

## 2015-09-24 DIAGNOSIS — Z1389 Encounter for screening for other disorder: Secondary | ICD-10-CM | POA: Diagnosis not present

## 2015-09-24 DIAGNOSIS — E11621 Type 2 diabetes mellitus with foot ulcer: Secondary | ICD-10-CM | POA: Diagnosis not present

## 2015-09-24 DIAGNOSIS — R471 Dysarthria and anarthria: Secondary | ICD-10-CM | POA: Diagnosis not present

## 2015-09-24 DIAGNOSIS — I10 Essential (primary) hypertension: Secondary | ICD-10-CM | POA: Diagnosis not present

## 2015-09-24 DIAGNOSIS — Z89512 Acquired absence of left leg below knee: Secondary | ICD-10-CM | POA: Diagnosis not present

## 2015-10-25 DIAGNOSIS — I482 Chronic atrial fibrillation: Secondary | ICD-10-CM | POA: Diagnosis not present

## 2015-10-26 DIAGNOSIS — I482 Chronic atrial fibrillation: Secondary | ICD-10-CM | POA: Diagnosis not present

## 2015-10-28 DIAGNOSIS — I482 Chronic atrial fibrillation: Secondary | ICD-10-CM | POA: Diagnosis not present

## 2015-11-01 DIAGNOSIS — I482 Chronic atrial fibrillation: Secondary | ICD-10-CM | POA: Diagnosis not present

## 2015-11-02 DIAGNOSIS — I482 Chronic atrial fibrillation: Secondary | ICD-10-CM | POA: Diagnosis not present

## 2015-11-03 DIAGNOSIS — I482 Chronic atrial fibrillation: Secondary | ICD-10-CM | POA: Diagnosis not present

## 2015-11-07 DIAGNOSIS — I482 Chronic atrial fibrillation: Secondary | ICD-10-CM | POA: Diagnosis not present

## 2015-11-08 DIAGNOSIS — I482 Chronic atrial fibrillation: Secondary | ICD-10-CM | POA: Diagnosis not present

## 2015-11-10 DIAGNOSIS — I482 Chronic atrial fibrillation: Secondary | ICD-10-CM | POA: Diagnosis not present

## 2015-11-11 DIAGNOSIS — I482 Chronic atrial fibrillation: Secondary | ICD-10-CM | POA: Diagnosis not present

## 2015-11-15 DIAGNOSIS — F801 Expressive language disorder: Secondary | ICD-10-CM | POA: Diagnosis not present

## 2015-11-16 DIAGNOSIS — F801 Expressive language disorder: Secondary | ICD-10-CM | POA: Diagnosis not present

## 2015-11-17 DIAGNOSIS — F801 Expressive language disorder: Secondary | ICD-10-CM | POA: Diagnosis not present

## 2015-11-18 DIAGNOSIS — E119 Type 2 diabetes mellitus without complications: Secondary | ICD-10-CM | POA: Diagnosis not present

## 2015-11-18 DIAGNOSIS — M069 Rheumatoid arthritis, unspecified: Secondary | ICD-10-CM | POA: Diagnosis not present

## 2015-12-16 DIAGNOSIS — R41840 Attention and concentration deficit: Secondary | ICD-10-CM | POA: Diagnosis not present

## 2015-12-16 DIAGNOSIS — R4182 Altered mental status, unspecified: Secondary | ICD-10-CM | POA: Diagnosis not present

## 2015-12-20 DIAGNOSIS — R4182 Altered mental status, unspecified: Secondary | ICD-10-CM | POA: Diagnosis not present

## 2015-12-20 DIAGNOSIS — R41840 Attention and concentration deficit: Secondary | ICD-10-CM | POA: Diagnosis not present

## 2015-12-29 DIAGNOSIS — R41840 Attention and concentration deficit: Secondary | ICD-10-CM | POA: Diagnosis not present

## 2015-12-29 DIAGNOSIS — R4182 Altered mental status, unspecified: Secondary | ICD-10-CM | POA: Diagnosis not present

## 2016-01-05 DIAGNOSIS — R41840 Attention and concentration deficit: Secondary | ICD-10-CM | POA: Diagnosis not present

## 2016-01-05 DIAGNOSIS — R4182 Altered mental status, unspecified: Secondary | ICD-10-CM | POA: Diagnosis not present

## 2016-01-06 DIAGNOSIS — R4182 Altered mental status, unspecified: Secondary | ICD-10-CM | POA: Diagnosis not present

## 2016-01-06 DIAGNOSIS — R41840 Attention and concentration deficit: Secondary | ICD-10-CM | POA: Diagnosis not present

## 2016-01-07 DIAGNOSIS — R41840 Attention and concentration deficit: Secondary | ICD-10-CM | POA: Diagnosis not present

## 2016-01-07 DIAGNOSIS — R4182 Altered mental status, unspecified: Secondary | ICD-10-CM | POA: Diagnosis not present

## 2016-03-01 DIAGNOSIS — M0579 Rheumatoid arthritis with rheumatoid factor of multiple sites without organ or systems involvement: Secondary | ICD-10-CM | POA: Diagnosis not present

## 2016-03-01 DIAGNOSIS — M150 Primary generalized (osteo)arthritis: Secondary | ICD-10-CM | POA: Diagnosis not present

## 2016-03-01 DIAGNOSIS — Z79899 Other long term (current) drug therapy: Secondary | ICD-10-CM | POA: Diagnosis not present

## 2016-04-06 DIAGNOSIS — E1151 Type 2 diabetes mellitus with diabetic peripheral angiopathy without gangrene: Secondary | ICD-10-CM | POA: Diagnosis not present

## 2016-04-06 DIAGNOSIS — I10 Essential (primary) hypertension: Secondary | ICD-10-CM | POA: Diagnosis not present

## 2016-04-06 DIAGNOSIS — Z1389 Encounter for screening for other disorder: Secondary | ICD-10-CM | POA: Diagnosis not present

## 2016-04-06 DIAGNOSIS — D6489 Other specified anemias: Secondary | ICD-10-CM | POA: Diagnosis not present

## 2016-04-06 DIAGNOSIS — Z794 Long term (current) use of insulin: Secondary | ICD-10-CM | POA: Diagnosis not present

## 2016-04-06 DIAGNOSIS — R471 Dysarthria and anarthria: Secondary | ICD-10-CM | POA: Diagnosis not present

## 2016-04-06 DIAGNOSIS — Z7901 Long term (current) use of anticoagulants: Secondary | ICD-10-CM | POA: Diagnosis not present

## 2016-04-06 DIAGNOSIS — M4806 Spinal stenosis, lumbar region: Secondary | ICD-10-CM | POA: Diagnosis not present

## 2016-04-06 DIAGNOSIS — I739 Peripheral vascular disease, unspecified: Secondary | ICD-10-CM | POA: Diagnosis not present

## 2016-04-08 DIAGNOSIS — N183 Chronic kidney disease, stage 3 (moderate): Secondary | ICD-10-CM | POA: Diagnosis not present

## 2016-04-08 DIAGNOSIS — I509 Heart failure, unspecified: Secondary | ICD-10-CM | POA: Diagnosis not present

## 2016-04-08 DIAGNOSIS — E1140 Type 2 diabetes mellitus with diabetic neuropathy, unspecified: Secondary | ICD-10-CM | POA: Diagnosis not present

## 2016-04-08 DIAGNOSIS — I130 Hypertensive heart and chronic kidney disease with heart failure and stage 1 through stage 4 chronic kidney disease, or unspecified chronic kidney disease: Secondary | ICD-10-CM | POA: Diagnosis not present

## 2016-04-08 DIAGNOSIS — M0600 Rheumatoid arthritis without rheumatoid factor, unspecified site: Secondary | ICD-10-CM | POA: Diagnosis not present

## 2016-04-08 DIAGNOSIS — R41841 Cognitive communication deficit: Secondary | ICD-10-CM | POA: Diagnosis not present

## 2016-04-10 DIAGNOSIS — E1140 Type 2 diabetes mellitus with diabetic neuropathy, unspecified: Secondary | ICD-10-CM | POA: Diagnosis not present

## 2016-04-10 DIAGNOSIS — R41841 Cognitive communication deficit: Secondary | ICD-10-CM | POA: Diagnosis not present

## 2016-04-10 DIAGNOSIS — M0600 Rheumatoid arthritis without rheumatoid factor, unspecified site: Secondary | ICD-10-CM | POA: Diagnosis not present

## 2016-04-10 DIAGNOSIS — I130 Hypertensive heart and chronic kidney disease with heart failure and stage 1 through stage 4 chronic kidney disease, or unspecified chronic kidney disease: Secondary | ICD-10-CM | POA: Diagnosis not present

## 2016-04-10 DIAGNOSIS — N183 Chronic kidney disease, stage 3 (moderate): Secondary | ICD-10-CM | POA: Diagnosis not present

## 2016-04-10 DIAGNOSIS — I509 Heart failure, unspecified: Secondary | ICD-10-CM | POA: Diagnosis not present

## 2016-04-11 DIAGNOSIS — E1140 Type 2 diabetes mellitus with diabetic neuropathy, unspecified: Secondary | ICD-10-CM | POA: Diagnosis not present

## 2016-04-11 DIAGNOSIS — R41841 Cognitive communication deficit: Secondary | ICD-10-CM | POA: Diagnosis not present

## 2016-04-11 DIAGNOSIS — I509 Heart failure, unspecified: Secondary | ICD-10-CM | POA: Diagnosis not present

## 2016-04-11 DIAGNOSIS — M0600 Rheumatoid arthritis without rheumatoid factor, unspecified site: Secondary | ICD-10-CM | POA: Diagnosis not present

## 2016-04-11 DIAGNOSIS — I130 Hypertensive heart and chronic kidney disease with heart failure and stage 1 through stage 4 chronic kidney disease, or unspecified chronic kidney disease: Secondary | ICD-10-CM | POA: Diagnosis not present

## 2016-04-11 DIAGNOSIS — N183 Chronic kidney disease, stage 3 (moderate): Secondary | ICD-10-CM | POA: Diagnosis not present

## 2016-04-13 DIAGNOSIS — M0600 Rheumatoid arthritis without rheumatoid factor, unspecified site: Secondary | ICD-10-CM | POA: Diagnosis not present

## 2016-04-13 DIAGNOSIS — I130 Hypertensive heart and chronic kidney disease with heart failure and stage 1 through stage 4 chronic kidney disease, or unspecified chronic kidney disease: Secondary | ICD-10-CM | POA: Diagnosis not present

## 2016-04-13 DIAGNOSIS — R41841 Cognitive communication deficit: Secondary | ICD-10-CM | POA: Diagnosis not present

## 2016-04-13 DIAGNOSIS — E1140 Type 2 diabetes mellitus with diabetic neuropathy, unspecified: Secondary | ICD-10-CM | POA: Diagnosis not present

## 2016-04-13 DIAGNOSIS — N183 Chronic kidney disease, stage 3 (moderate): Secondary | ICD-10-CM | POA: Diagnosis not present

## 2016-04-13 DIAGNOSIS — I509 Heart failure, unspecified: Secondary | ICD-10-CM | POA: Diagnosis not present

## 2016-04-18 DIAGNOSIS — R41841 Cognitive communication deficit: Secondary | ICD-10-CM | POA: Diagnosis not present

## 2016-04-18 DIAGNOSIS — I509 Heart failure, unspecified: Secondary | ICD-10-CM | POA: Diagnosis not present

## 2016-04-18 DIAGNOSIS — E1140 Type 2 diabetes mellitus with diabetic neuropathy, unspecified: Secondary | ICD-10-CM | POA: Diagnosis not present

## 2016-04-18 DIAGNOSIS — M0600 Rheumatoid arthritis without rheumatoid factor, unspecified site: Secondary | ICD-10-CM | POA: Diagnosis not present

## 2016-04-18 DIAGNOSIS — I130 Hypertensive heart and chronic kidney disease with heart failure and stage 1 through stage 4 chronic kidney disease, or unspecified chronic kidney disease: Secondary | ICD-10-CM | POA: Diagnosis not present

## 2016-04-18 DIAGNOSIS — N183 Chronic kidney disease, stage 3 (moderate): Secondary | ICD-10-CM | POA: Diagnosis not present

## 2016-04-19 DIAGNOSIS — I509 Heart failure, unspecified: Secondary | ICD-10-CM | POA: Diagnosis not present

## 2016-04-19 DIAGNOSIS — R41841 Cognitive communication deficit: Secondary | ICD-10-CM | POA: Diagnosis not present

## 2016-04-19 DIAGNOSIS — E1140 Type 2 diabetes mellitus with diabetic neuropathy, unspecified: Secondary | ICD-10-CM | POA: Diagnosis not present

## 2016-04-19 DIAGNOSIS — M0600 Rheumatoid arthritis without rheumatoid factor, unspecified site: Secondary | ICD-10-CM | POA: Diagnosis not present

## 2016-04-19 DIAGNOSIS — I130 Hypertensive heart and chronic kidney disease with heart failure and stage 1 through stage 4 chronic kidney disease, or unspecified chronic kidney disease: Secondary | ICD-10-CM | POA: Diagnosis not present

## 2016-04-19 DIAGNOSIS — N183 Chronic kidney disease, stage 3 (moderate): Secondary | ICD-10-CM | POA: Diagnosis not present

## 2016-04-20 DIAGNOSIS — M0600 Rheumatoid arthritis without rheumatoid factor, unspecified site: Secondary | ICD-10-CM | POA: Diagnosis not present

## 2016-04-20 DIAGNOSIS — E1140 Type 2 diabetes mellitus with diabetic neuropathy, unspecified: Secondary | ICD-10-CM | POA: Diagnosis not present

## 2016-04-20 DIAGNOSIS — R41841 Cognitive communication deficit: Secondary | ICD-10-CM | POA: Diagnosis not present

## 2016-04-20 DIAGNOSIS — I509 Heart failure, unspecified: Secondary | ICD-10-CM | POA: Diagnosis not present

## 2016-04-20 DIAGNOSIS — N183 Chronic kidney disease, stage 3 (moderate): Secondary | ICD-10-CM | POA: Diagnosis not present

## 2016-04-20 DIAGNOSIS — I130 Hypertensive heart and chronic kidney disease with heart failure and stage 1 through stage 4 chronic kidney disease, or unspecified chronic kidney disease: Secondary | ICD-10-CM | POA: Diagnosis not present

## 2016-04-24 DIAGNOSIS — N183 Chronic kidney disease, stage 3 (moderate): Secondary | ICD-10-CM | POA: Diagnosis not present

## 2016-04-24 DIAGNOSIS — R41841 Cognitive communication deficit: Secondary | ICD-10-CM | POA: Diagnosis not present

## 2016-04-24 DIAGNOSIS — M0600 Rheumatoid arthritis without rheumatoid factor, unspecified site: Secondary | ICD-10-CM | POA: Diagnosis not present

## 2016-04-24 DIAGNOSIS — E1140 Type 2 diabetes mellitus with diabetic neuropathy, unspecified: Secondary | ICD-10-CM | POA: Diagnosis not present

## 2016-04-24 DIAGNOSIS — I130 Hypertensive heart and chronic kidney disease with heart failure and stage 1 through stage 4 chronic kidney disease, or unspecified chronic kidney disease: Secondary | ICD-10-CM | POA: Diagnosis not present

## 2016-04-24 DIAGNOSIS — I509 Heart failure, unspecified: Secondary | ICD-10-CM | POA: Diagnosis not present

## 2016-04-25 DIAGNOSIS — N183 Chronic kidney disease, stage 3 (moderate): Secondary | ICD-10-CM | POA: Diagnosis not present

## 2016-04-25 DIAGNOSIS — I130 Hypertensive heart and chronic kidney disease with heart failure and stage 1 through stage 4 chronic kidney disease, or unspecified chronic kidney disease: Secondary | ICD-10-CM | POA: Diagnosis not present

## 2016-04-25 DIAGNOSIS — R41841 Cognitive communication deficit: Secondary | ICD-10-CM | POA: Diagnosis not present

## 2016-04-25 DIAGNOSIS — I509 Heart failure, unspecified: Secondary | ICD-10-CM | POA: Diagnosis not present

## 2016-04-25 DIAGNOSIS — E1140 Type 2 diabetes mellitus with diabetic neuropathy, unspecified: Secondary | ICD-10-CM | POA: Diagnosis not present

## 2016-04-25 DIAGNOSIS — M0600 Rheumatoid arthritis without rheumatoid factor, unspecified site: Secondary | ICD-10-CM | POA: Diagnosis not present

## 2016-04-26 DIAGNOSIS — M0600 Rheumatoid arthritis without rheumatoid factor, unspecified site: Secondary | ICD-10-CM | POA: Diagnosis not present

## 2016-04-26 DIAGNOSIS — I509 Heart failure, unspecified: Secondary | ICD-10-CM | POA: Diagnosis not present

## 2016-04-26 DIAGNOSIS — N183 Chronic kidney disease, stage 3 (moderate): Secondary | ICD-10-CM | POA: Diagnosis not present

## 2016-04-26 DIAGNOSIS — I130 Hypertensive heart and chronic kidney disease with heart failure and stage 1 through stage 4 chronic kidney disease, or unspecified chronic kidney disease: Secondary | ICD-10-CM | POA: Diagnosis not present

## 2016-04-26 DIAGNOSIS — E1140 Type 2 diabetes mellitus with diabetic neuropathy, unspecified: Secondary | ICD-10-CM | POA: Diagnosis not present

## 2016-04-26 DIAGNOSIS — R41841 Cognitive communication deficit: Secondary | ICD-10-CM | POA: Diagnosis not present

## 2016-04-27 DIAGNOSIS — R41841 Cognitive communication deficit: Secondary | ICD-10-CM | POA: Diagnosis not present

## 2016-04-27 DIAGNOSIS — M0600 Rheumatoid arthritis without rheumatoid factor, unspecified site: Secondary | ICD-10-CM | POA: Diagnosis not present

## 2016-04-27 DIAGNOSIS — E1140 Type 2 diabetes mellitus with diabetic neuropathy, unspecified: Secondary | ICD-10-CM | POA: Diagnosis not present

## 2016-04-27 DIAGNOSIS — I509 Heart failure, unspecified: Secondary | ICD-10-CM | POA: Diagnosis not present

## 2016-04-27 DIAGNOSIS — I130 Hypertensive heart and chronic kidney disease with heart failure and stage 1 through stage 4 chronic kidney disease, or unspecified chronic kidney disease: Secondary | ICD-10-CM | POA: Diagnosis not present

## 2016-04-27 DIAGNOSIS — N183 Chronic kidney disease, stage 3 (moderate): Secondary | ICD-10-CM | POA: Diagnosis not present

## 2016-04-28 DIAGNOSIS — I509 Heart failure, unspecified: Secondary | ICD-10-CM | POA: Diagnosis not present

## 2016-04-28 DIAGNOSIS — I130 Hypertensive heart and chronic kidney disease with heart failure and stage 1 through stage 4 chronic kidney disease, or unspecified chronic kidney disease: Secondary | ICD-10-CM | POA: Diagnosis not present

## 2016-04-28 DIAGNOSIS — E1140 Type 2 diabetes mellitus with diabetic neuropathy, unspecified: Secondary | ICD-10-CM | POA: Diagnosis not present

## 2016-04-28 DIAGNOSIS — N183 Chronic kidney disease, stage 3 (moderate): Secondary | ICD-10-CM | POA: Diagnosis not present

## 2016-04-28 DIAGNOSIS — M0600 Rheumatoid arthritis without rheumatoid factor, unspecified site: Secondary | ICD-10-CM | POA: Diagnosis not present

## 2016-04-28 DIAGNOSIS — R41841 Cognitive communication deficit: Secondary | ICD-10-CM | POA: Diagnosis not present

## 2016-05-01 DIAGNOSIS — I130 Hypertensive heart and chronic kidney disease with heart failure and stage 1 through stage 4 chronic kidney disease, or unspecified chronic kidney disease: Secondary | ICD-10-CM | POA: Diagnosis not present

## 2016-05-01 DIAGNOSIS — E1140 Type 2 diabetes mellitus with diabetic neuropathy, unspecified: Secondary | ICD-10-CM | POA: Diagnosis not present

## 2016-05-01 DIAGNOSIS — R41841 Cognitive communication deficit: Secondary | ICD-10-CM | POA: Diagnosis not present

## 2016-05-01 DIAGNOSIS — I509 Heart failure, unspecified: Secondary | ICD-10-CM | POA: Diagnosis not present

## 2016-05-01 DIAGNOSIS — N183 Chronic kidney disease, stage 3 (moderate): Secondary | ICD-10-CM | POA: Diagnosis not present

## 2016-05-01 DIAGNOSIS — M0600 Rheumatoid arthritis without rheumatoid factor, unspecified site: Secondary | ICD-10-CM | POA: Diagnosis not present

## 2016-05-03 DIAGNOSIS — I130 Hypertensive heart and chronic kidney disease with heart failure and stage 1 through stage 4 chronic kidney disease, or unspecified chronic kidney disease: Secondary | ICD-10-CM | POA: Diagnosis not present

## 2016-05-03 DIAGNOSIS — M0600 Rheumatoid arthritis without rheumatoid factor, unspecified site: Secondary | ICD-10-CM | POA: Diagnosis not present

## 2016-05-03 DIAGNOSIS — E1140 Type 2 diabetes mellitus with diabetic neuropathy, unspecified: Secondary | ICD-10-CM | POA: Diagnosis not present

## 2016-05-03 DIAGNOSIS — R41841 Cognitive communication deficit: Secondary | ICD-10-CM | POA: Diagnosis not present

## 2016-05-03 DIAGNOSIS — N183 Chronic kidney disease, stage 3 (moderate): Secondary | ICD-10-CM | POA: Diagnosis not present

## 2016-05-03 DIAGNOSIS — I509 Heart failure, unspecified: Secondary | ICD-10-CM | POA: Diagnosis not present

## 2016-05-05 DIAGNOSIS — N183 Chronic kidney disease, stage 3 (moderate): Secondary | ICD-10-CM | POA: Diagnosis not present

## 2016-05-05 DIAGNOSIS — I509 Heart failure, unspecified: Secondary | ICD-10-CM | POA: Diagnosis not present

## 2016-05-05 DIAGNOSIS — I130 Hypertensive heart and chronic kidney disease with heart failure and stage 1 through stage 4 chronic kidney disease, or unspecified chronic kidney disease: Secondary | ICD-10-CM | POA: Diagnosis not present

## 2016-05-05 DIAGNOSIS — E1140 Type 2 diabetes mellitus with diabetic neuropathy, unspecified: Secondary | ICD-10-CM | POA: Diagnosis not present

## 2016-05-05 DIAGNOSIS — M0600 Rheumatoid arthritis without rheumatoid factor, unspecified site: Secondary | ICD-10-CM | POA: Diagnosis not present

## 2016-05-05 DIAGNOSIS — R41841 Cognitive communication deficit: Secondary | ICD-10-CM | POA: Diagnosis not present

## 2016-05-08 DIAGNOSIS — N183 Chronic kidney disease, stage 3 (moderate): Secondary | ICD-10-CM | POA: Diagnosis not present

## 2016-05-08 DIAGNOSIS — E1140 Type 2 diabetes mellitus with diabetic neuropathy, unspecified: Secondary | ICD-10-CM | POA: Diagnosis not present

## 2016-05-08 DIAGNOSIS — I130 Hypertensive heart and chronic kidney disease with heart failure and stage 1 through stage 4 chronic kidney disease, or unspecified chronic kidney disease: Secondary | ICD-10-CM | POA: Diagnosis not present

## 2016-05-08 DIAGNOSIS — M0600 Rheumatoid arthritis without rheumatoid factor, unspecified site: Secondary | ICD-10-CM | POA: Diagnosis not present

## 2016-05-08 DIAGNOSIS — I509 Heart failure, unspecified: Secondary | ICD-10-CM | POA: Diagnosis not present

## 2016-05-08 DIAGNOSIS — R41841 Cognitive communication deficit: Secondary | ICD-10-CM | POA: Diagnosis not present

## 2016-05-10 DIAGNOSIS — I130 Hypertensive heart and chronic kidney disease with heart failure and stage 1 through stage 4 chronic kidney disease, or unspecified chronic kidney disease: Secondary | ICD-10-CM | POA: Diagnosis not present

## 2016-05-10 DIAGNOSIS — R41841 Cognitive communication deficit: Secondary | ICD-10-CM | POA: Diagnosis not present

## 2016-05-10 DIAGNOSIS — M0600 Rheumatoid arthritis without rheumatoid factor, unspecified site: Secondary | ICD-10-CM | POA: Diagnosis not present

## 2016-05-10 DIAGNOSIS — E1140 Type 2 diabetes mellitus with diabetic neuropathy, unspecified: Secondary | ICD-10-CM | POA: Diagnosis not present

## 2016-05-10 DIAGNOSIS — I509 Heart failure, unspecified: Secondary | ICD-10-CM | POA: Diagnosis not present

## 2016-05-10 DIAGNOSIS — N183 Chronic kidney disease, stage 3 (moderate): Secondary | ICD-10-CM | POA: Diagnosis not present

## 2016-05-12 DIAGNOSIS — N183 Chronic kidney disease, stage 3 (moderate): Secondary | ICD-10-CM | POA: Diagnosis not present

## 2016-05-12 DIAGNOSIS — R41841 Cognitive communication deficit: Secondary | ICD-10-CM | POA: Diagnosis not present

## 2016-05-12 DIAGNOSIS — I509 Heart failure, unspecified: Secondary | ICD-10-CM | POA: Diagnosis not present

## 2016-05-12 DIAGNOSIS — I130 Hypertensive heart and chronic kidney disease with heart failure and stage 1 through stage 4 chronic kidney disease, or unspecified chronic kidney disease: Secondary | ICD-10-CM | POA: Diagnosis not present

## 2016-05-12 DIAGNOSIS — M0600 Rheumatoid arthritis without rheumatoid factor, unspecified site: Secondary | ICD-10-CM | POA: Diagnosis not present

## 2016-05-12 DIAGNOSIS — E1140 Type 2 diabetes mellitus with diabetic neuropathy, unspecified: Secondary | ICD-10-CM | POA: Diagnosis not present

## 2016-05-17 DIAGNOSIS — R41841 Cognitive communication deficit: Secondary | ICD-10-CM | POA: Diagnosis not present

## 2016-05-17 DIAGNOSIS — E1140 Type 2 diabetes mellitus with diabetic neuropathy, unspecified: Secondary | ICD-10-CM | POA: Diagnosis not present

## 2016-05-17 DIAGNOSIS — M0600 Rheumatoid arthritis without rheumatoid factor, unspecified site: Secondary | ICD-10-CM | POA: Diagnosis not present

## 2016-05-17 DIAGNOSIS — I130 Hypertensive heart and chronic kidney disease with heart failure and stage 1 through stage 4 chronic kidney disease, or unspecified chronic kidney disease: Secondary | ICD-10-CM | POA: Diagnosis not present

## 2016-05-17 DIAGNOSIS — N183 Chronic kidney disease, stage 3 (moderate): Secondary | ICD-10-CM | POA: Diagnosis not present

## 2016-05-17 DIAGNOSIS — I509 Heart failure, unspecified: Secondary | ICD-10-CM | POA: Diagnosis not present

## 2016-05-19 DIAGNOSIS — E1140 Type 2 diabetes mellitus with diabetic neuropathy, unspecified: Secondary | ICD-10-CM | POA: Diagnosis not present

## 2016-05-19 DIAGNOSIS — M0600 Rheumatoid arthritis without rheumatoid factor, unspecified site: Secondary | ICD-10-CM | POA: Diagnosis not present

## 2016-05-19 DIAGNOSIS — I509 Heart failure, unspecified: Secondary | ICD-10-CM | POA: Diagnosis not present

## 2016-05-19 DIAGNOSIS — N183 Chronic kidney disease, stage 3 (moderate): Secondary | ICD-10-CM | POA: Diagnosis not present

## 2016-05-19 DIAGNOSIS — R41841 Cognitive communication deficit: Secondary | ICD-10-CM | POA: Diagnosis not present

## 2016-05-19 DIAGNOSIS — I130 Hypertensive heart and chronic kidney disease with heart failure and stage 1 through stage 4 chronic kidney disease, or unspecified chronic kidney disease: Secondary | ICD-10-CM | POA: Diagnosis not present

## 2016-05-29 DIAGNOSIS — E119 Type 2 diabetes mellitus without complications: Secondary | ICD-10-CM | POA: Diagnosis not present

## 2016-05-29 DIAGNOSIS — N183 Chronic kidney disease, stage 3 (moderate): Secondary | ICD-10-CM | POA: Diagnosis not present

## 2016-05-30 DIAGNOSIS — R05 Cough: Secondary | ICD-10-CM | POA: Diagnosis not present

## 2016-07-03 ENCOUNTER — Telehealth (INDEPENDENT_AMBULATORY_CARE_PROVIDER_SITE_OTHER): Payer: Self-pay | Admitting: Orthopedic Surgery

## 2016-07-10 ENCOUNTER — Telehealth (INDEPENDENT_AMBULATORY_CARE_PROVIDER_SITE_OTHER): Payer: Self-pay | Admitting: Orthopedic Surgery

## 2016-07-10 NOTE — Telephone Encounter (Signed)
Patients daughter called wanting to pick up donuts for her moms shoes. Please call her and let her know when these will be available. She has been waiting over a week.

## 2016-07-10 NOTE — Telephone Encounter (Signed)
I called patients daughter to advise that pads will be at the front desk for pick up.

## 2016-07-10 NOTE — Telephone Encounter (Signed)
I called and apologized advised these would be at the front desk.

## 2016-07-18 ENCOUNTER — Emergency Department (HOSPITAL_COMMUNITY): Payer: Medicare Other

## 2016-07-18 ENCOUNTER — Inpatient Hospital Stay (HOSPITAL_COMMUNITY)
Admission: EM | Admit: 2016-07-18 | Discharge: 2016-07-24 | DRG: 871 | Disposition: A | Discharge status: Home / Self Care | Payer: Medicare Other | Attending: Internal Medicine | Admitting: Internal Medicine

## 2016-07-18 ENCOUNTER — Encounter (HOSPITAL_COMMUNITY): Payer: Self-pay | Admitting: Emergency Medicine

## 2016-07-18 DIAGNOSIS — E039 Hypothyroidism, unspecified: Secondary | ICD-10-CM | POA: Diagnosis present

## 2016-07-18 DIAGNOSIS — H3530 Unspecified macular degeneration: Secondary | ICD-10-CM | POA: Diagnosis present

## 2016-07-18 DIAGNOSIS — R6520 Severe sepsis without septic shock: Secondary | ICD-10-CM | POA: Diagnosis not present

## 2016-07-18 DIAGNOSIS — K830 Cholangitis: Secondary | ICD-10-CM | POA: Diagnosis present

## 2016-07-18 DIAGNOSIS — E1122 Type 2 diabetes mellitus with diabetic chronic kidney disease: Secondary | ICD-10-CM | POA: Diagnosis present

## 2016-07-18 DIAGNOSIS — E119 Type 2 diabetes mellitus without complications: Secondary | ICD-10-CM

## 2016-07-18 DIAGNOSIS — R17 Unspecified jaundice: Secondary | ICD-10-CM | POA: Diagnosis present

## 2016-07-18 DIAGNOSIS — R531 Weakness: Secondary | ICD-10-CM | POA: Diagnosis not present

## 2016-07-18 DIAGNOSIS — J439 Emphysema, unspecified: Secondary | ICD-10-CM | POA: Diagnosis present

## 2016-07-18 DIAGNOSIS — I482 Chronic atrial fibrillation: Secondary | ICD-10-CM | POA: Diagnosis present

## 2016-07-18 DIAGNOSIS — I10 Essential (primary) hypertension: Secondary | ICD-10-CM | POA: Diagnosis not present

## 2016-07-18 DIAGNOSIS — N179 Acute kidney failure, unspecified: Secondary | ICD-10-CM | POA: Diagnosis present

## 2016-07-18 DIAGNOSIS — G629 Polyneuropathy, unspecified: Secondary | ICD-10-CM | POA: Diagnosis present

## 2016-07-18 DIAGNOSIS — Z79899 Other long term (current) drug therapy: Secondary | ICD-10-CM

## 2016-07-18 DIAGNOSIS — E7800 Pure hypercholesterolemia, unspecified: Secondary | ICD-10-CM | POA: Diagnosis present

## 2016-07-18 DIAGNOSIS — E1151 Type 2 diabetes mellitus with diabetic peripheral angiopathy without gangrene: Secondary | ICD-10-CM | POA: Diagnosis present

## 2016-07-18 DIAGNOSIS — Z89512 Acquired absence of left leg below knee: Secondary | ICD-10-CM

## 2016-07-18 DIAGNOSIS — I4891 Unspecified atrial fibrillation: Secondary | ICD-10-CM | POA: Diagnosis present

## 2016-07-18 DIAGNOSIS — G9341 Metabolic encephalopathy: Secondary | ICD-10-CM | POA: Diagnosis present

## 2016-07-18 DIAGNOSIS — Z452 Encounter for adjustment and management of vascular access device: Secondary | ICD-10-CM | POA: Diagnosis not present

## 2016-07-18 DIAGNOSIS — R945 Abnormal results of liver function studies: Secondary | ICD-10-CM | POA: Diagnosis not present

## 2016-07-18 DIAGNOSIS — K8050 Calculus of bile duct without cholangitis or cholecystitis without obstruction: Secondary | ICD-10-CM | POA: Diagnosis present

## 2016-07-18 DIAGNOSIS — R4701 Aphasia: Secondary | ICD-10-CM | POA: Diagnosis present

## 2016-07-18 DIAGNOSIS — Z96642 Presence of left artificial hip joint: Secondary | ICD-10-CM | POA: Diagnosis present

## 2016-07-18 DIAGNOSIS — A4151 Sepsis due to Escherichia coli [E. coli]: Principal | ICD-10-CM | POA: Diagnosis present

## 2016-07-18 DIAGNOSIS — R748 Abnormal levels of other serum enzymes: Secondary | ICD-10-CM

## 2016-07-18 DIAGNOSIS — I509 Heart failure, unspecified: Secondary | ICD-10-CM | POA: Diagnosis present

## 2016-07-18 DIAGNOSIS — R188 Other ascites: Secondary | ICD-10-CM | POA: Diagnosis not present

## 2016-07-18 DIAGNOSIS — K5730 Diverticulosis of large intestine without perforation or abscess without bleeding: Secondary | ICD-10-CM | POA: Diagnosis not present

## 2016-07-18 DIAGNOSIS — K219 Gastro-esophageal reflux disease without esophagitis: Secondary | ICD-10-CM | POA: Diagnosis present

## 2016-07-18 DIAGNOSIS — R509 Fever, unspecified: Secondary | ICD-10-CM | POA: Diagnosis not present

## 2016-07-18 DIAGNOSIS — J9811 Atelectasis: Secondary | ICD-10-CM | POA: Diagnosis not present

## 2016-07-18 DIAGNOSIS — R932 Abnormal findings on diagnostic imaging of liver and biliary tract: Secondary | ICD-10-CM | POA: Diagnosis not present

## 2016-07-18 DIAGNOSIS — I130 Hypertensive heart and chronic kidney disease with heart failure and stage 1 through stage 4 chronic kidney disease, or unspecified chronic kidney disease: Secondary | ICD-10-CM | POA: Diagnosis present

## 2016-07-18 DIAGNOSIS — R935 Abnormal findings on diagnostic imaging of other abdominal regions, including retroperitoneum: Secondary | ICD-10-CM | POA: Diagnosis not present

## 2016-07-18 DIAGNOSIS — D696 Thrombocytopenia, unspecified: Secondary | ICD-10-CM | POA: Diagnosis present

## 2016-07-18 DIAGNOSIS — D649 Anemia, unspecified: Secondary | ICD-10-CM | POA: Diagnosis present

## 2016-07-18 DIAGNOSIS — Z7901 Long term (current) use of anticoagulants: Secondary | ICD-10-CM | POA: Diagnosis not present

## 2016-07-18 DIAGNOSIS — N183 Chronic kidney disease, stage 3 (moderate): Secondary | ICD-10-CM | POA: Diagnosis present

## 2016-07-18 DIAGNOSIS — M069 Rheumatoid arthritis, unspecified: Secondary | ICD-10-CM | POA: Diagnosis present

## 2016-07-18 DIAGNOSIS — Z888 Allergy status to other drugs, medicaments and biological substances status: Secondary | ICD-10-CM

## 2016-07-18 DIAGNOSIS — Z8249 Family history of ischemic heart disease and other diseases of the circulatory system: Secondary | ICD-10-CM

## 2016-07-18 DIAGNOSIS — A419 Sepsis, unspecified organism: Secondary | ICD-10-CM | POA: Diagnosis not present

## 2016-07-18 DIAGNOSIS — R740 Nonspecific elevation of levels of transaminase and lactic acid dehydrogenase [LDH]: Secondary | ICD-10-CM | POA: Diagnosis not present

## 2016-07-18 DIAGNOSIS — R7881 Bacteremia: Secondary | ICD-10-CM | POA: Diagnosis not present

## 2016-07-18 DIAGNOSIS — Z794 Long term (current) use of insulin: Secondary | ICD-10-CM

## 2016-07-18 DIAGNOSIS — Z7984 Long term (current) use of oral hypoglycemic drugs: Secondary | ICD-10-CM

## 2016-07-18 DIAGNOSIS — R7401 Elevation of levels of liver transaminase levels: Secondary | ICD-10-CM | POA: Diagnosis present

## 2016-07-18 DIAGNOSIS — Z885 Allergy status to narcotic agent status: Secondary | ICD-10-CM

## 2016-07-18 DIAGNOSIS — E785 Hyperlipidemia, unspecified: Secondary | ICD-10-CM | POA: Diagnosis present

## 2016-07-18 DIAGNOSIS — R404 Transient alteration of awareness: Secondary | ICD-10-CM | POA: Diagnosis not present

## 2016-07-18 DIAGNOSIS — E118 Type 2 diabetes mellitus with unspecified complications: Secondary | ICD-10-CM | POA: Diagnosis not present

## 2016-07-18 DIAGNOSIS — Z993 Dependence on wheelchair: Secondary | ICD-10-CM

## 2016-07-18 DIAGNOSIS — R933 Abnormal findings on diagnostic imaging of other parts of digestive tract: Secondary | ICD-10-CM | POA: Diagnosis not present

## 2016-07-18 DIAGNOSIS — Z823 Family history of stroke: Secondary | ICD-10-CM

## 2016-07-18 LAB — URINALYSIS, ROUTINE W REFLEX MICROSCOPIC
BACTERIA UA: NONE SEEN
BILIRUBIN URINE: NEGATIVE
Glucose, UA: NEGATIVE mg/dL
Hgb urine dipstick: NEGATIVE
KETONES UR: NEGATIVE mg/dL
Nitrite: NEGATIVE
PH: 5 (ref 5.0–8.0)
PROTEIN: NEGATIVE mg/dL
Specific Gravity, Urine: 1.009 (ref 1.005–1.030)

## 2016-07-18 LAB — PROCALCITONIN: Procalcitonin: 58.18 ng/mL

## 2016-07-18 LAB — COMPREHENSIVE METABOLIC PANEL
ALBUMIN: 4.2 g/dL (ref 3.5–5.0)
ALK PHOS: 296 U/L — AB (ref 38–126)
ALT: 493 U/L — AB (ref 14–54)
ANION GAP: 11 (ref 5–15)
AST: 1047 U/L — ABNORMAL HIGH (ref 15–41)
BUN: 20 mg/dL (ref 6–20)
CALCIUM: 9.3 mg/dL (ref 8.9–10.3)
CHLORIDE: 104 mmol/L (ref 101–111)
CO2: 26 mmol/L (ref 22–32)
CREATININE: 1.4 mg/dL — AB (ref 0.44–1.00)
GFR calc non Af Amer: 31 mL/min — ABNORMAL LOW (ref >60)
GFR, EST AFRICAN AMERICAN: 36 mL/min — AB (ref >60)
GLUCOSE: 169 mg/dL — AB (ref 65–99)
Potassium: 4.1 mmol/L (ref 3.5–5.1)
SODIUM: 141 mmol/L (ref 135–145)
Total Bilirubin: 2.7 mg/dL — ABNORMAL HIGH (ref 0.3–1.2)
Total Protein: 6.3 g/dL — ABNORMAL LOW (ref 6.5–8.1)

## 2016-07-18 LAB — CBC WITH DIFFERENTIAL/PLATELET
BASOS PCT: 0 %
Basophils Absolute: 0 10*3/uL (ref 0.0–0.1)
Eosinophils Absolute: 0 10*3/uL (ref 0.0–0.7)
Eosinophils Relative: 0 %
HEMATOCRIT: 33.6 % — AB (ref 36.0–46.0)
HEMOGLOBIN: 11 g/dL — AB (ref 12.0–15.0)
LYMPHS ABS: 0.2 10*3/uL — AB (ref 0.7–4.0)
Lymphocytes Relative: 3 %
MCH: 31.8 pg (ref 26.0–34.0)
MCHC: 32.7 g/dL (ref 30.0–36.0)
MCV: 97.1 fL (ref 78.0–100.0)
MONO ABS: 0.6 10*3/uL (ref 0.1–1.0)
MONOS PCT: 7 %
NEUTROS ABS: 7.8 10*3/uL — AB (ref 1.7–7.7)
NEUTROS PCT: 90 %
Platelets: 141 10*3/uL — ABNORMAL LOW (ref 150–400)
RBC: 3.46 MIL/uL — ABNORMAL LOW (ref 3.87–5.11)
RDW: 15.6 % — AB (ref 11.5–15.5)
WBC: 8.7 10*3/uL (ref 4.0–10.5)

## 2016-07-18 LAB — TSH: TSH: 1.457 u[IU]/mL (ref 0.350–4.500)

## 2016-07-18 LAB — PROTIME-INR
INR: 1.44
PROTHROMBIN TIME: 17.6 s — AB (ref 11.4–15.2)

## 2016-07-18 LAB — LACTIC ACID, PLASMA: Lactic Acid, Venous: 3.2 mmol/L (ref 0.5–1.9)

## 2016-07-18 LAB — I-STAT CG4 LACTIC ACID, ED: Lactic Acid, Venous: 3.19 mmol/L (ref 0.5–1.9)

## 2016-07-18 LAB — I-STAT TROPONIN, ED: TROPONIN I, POC: 0.02 ng/mL (ref 0.00–0.08)

## 2016-07-18 LAB — AMMONIA: Ammonia: 17 umol/L (ref 9–35)

## 2016-07-18 LAB — TROPONIN I: TROPONIN I: 0.03 ng/mL — AB (ref <0.03)

## 2016-07-18 MED ORDER — GABAPENTIN 300 MG PO CAPS
300.0000 mg | ORAL_CAPSULE | Freq: Two times a day (BID) | ORAL | Status: DC
  Administered 2016-07-18 – 2016-07-24 (×12): 300 mg via ORAL
  Filled 2016-07-18 (×12): qty 1

## 2016-07-18 MED ORDER — IOPAMIDOL (ISOVUE-300) INJECTION 61%
75.0000 mL | Freq: Once | INTRAVENOUS | Status: AC | PRN
  Administered 2016-07-18: 50 mL via INTRAVENOUS

## 2016-07-18 MED ORDER — SODIUM CHLORIDE 0.9 % IV BOLUS (SEPSIS)
250.0000 mL | Freq: Once | INTRAVENOUS | Status: AC
  Administered 2016-07-19: 500 mL via INTRAVENOUS

## 2016-07-18 MED ORDER — TIOTROPIUM BROMIDE MONOHYDRATE 18 MCG IN CAPS
18.0000 ug | ORAL_CAPSULE | Freq: Every day | RESPIRATORY_TRACT | Status: DC
  Administered 2016-07-19 – 2016-07-24 (×5): 18 ug via RESPIRATORY_TRACT
  Filled 2016-07-18: qty 5

## 2016-07-18 MED ORDER — INSULIN LISPRO 100 UNIT/ML (KWIKPEN): 2.0000 [IU] | PEN_INJECTOR | Freq: Two times a day (BID) | SUBCUTANEOUS | Status: DC

## 2016-07-18 MED ORDER — MUPIROCIN 2 % EX OINT
1.0000 "application " | TOPICAL_OINTMENT | Freq: Every day | CUTANEOUS | Status: DC | PRN
  Filled 2016-07-18: qty 22

## 2016-07-18 MED ORDER — IBUPROFEN 200 MG PO TABS: 600.0000 mg | ORAL_TABLET | Freq: Four times a day (QID) | ORAL | Status: DC | PRN

## 2016-07-18 MED ORDER — IBUPROFEN 200 MG PO TABS
600.0000 mg | ORAL_TABLET | Freq: Once | ORAL | Status: AC
  Administered 2016-07-18: 600 mg via ORAL
  Filled 2016-07-18: qty 3

## 2016-07-18 MED ORDER — LEVOTHYROXINE SODIUM 50 MCG PO TABS
50.0000 ug | ORAL_TABLET | Freq: Every day | ORAL | Status: DC
  Administered 2016-07-19 – 2016-07-24 (×6): 50 ug via ORAL
  Filled 2016-07-18 (×6): qty 1

## 2016-07-18 MED ORDER — SODIUM CHLORIDE 0.9 % IV SOLN
INTRAVENOUS | Status: DC
  Administered 2016-07-18: 22:00:00 via INTRAVENOUS

## 2016-07-18 MED ORDER — SODIUM CHLORIDE 0.9 % IJ SOLN
INTRAMUSCULAR | Status: AC
  Filled 2016-07-18: qty 50

## 2016-07-18 MED ORDER — VANCOMYCIN HCL IN DEXTROSE 750-5 MG/150ML-% IV SOLN
750.0000 mg | INTRAVENOUS | Status: DC
  Filled 2016-07-18: qty 150

## 2016-07-18 MED ORDER — SODIUM CHLORIDE 0.9 % IV BOLUS (SEPSIS)
1000.0000 mL | Freq: Once | INTRAVENOUS | Status: AC
  Administered 2016-07-18: 1000 mL via INTRAVENOUS

## 2016-07-18 MED ORDER — FOLIC ACID 1 MG PO TABS
1.0000 mg | ORAL_TABLET | Freq: Every day | ORAL | Status: DC
Start: 2016-07-19 — End: 2016-07-24
  Administered 2016-07-19 – 2016-07-24 (×6): 1 mg via ORAL
  Filled 2016-07-18 (×5): qty 1

## 2016-07-18 MED ORDER — DICLOFENAC SODIUM 1 % TD GEL
2.0000 g | Freq: Four times a day (QID) | TRANSDERMAL | Status: DC
  Administered 2016-07-19 – 2016-07-24 (×16): 2 g via TOPICAL
  Filled 2016-07-18: qty 100

## 2016-07-18 MED ORDER — PIPERACILLIN-TAZOBACTAM 3.375 G IVPB
3.3750 g | Freq: Three times a day (TID) | INTRAVENOUS | Status: DC
  Administered 2016-07-19: 3.375 g via INTRAVENOUS
  Filled 2016-07-18 (×2): qty 50

## 2016-07-18 MED ORDER — DILTIAZEM HCL 60 MG PO TABS
60.0000 mg | ORAL_TABLET | Freq: Two times a day (BID) | ORAL | Status: DC
  Administered 2016-07-18 – 2016-07-24 (×12): 60 mg via ORAL
  Filled 2016-07-18 (×12): qty 1

## 2016-07-18 MED ORDER — APIXABAN 2.5 MG PO TABS
2.5000 mg | ORAL_TABLET | Freq: Two times a day (BID) | ORAL | Status: DC
  Administered 2016-07-18: 2.5 mg via ORAL
  Filled 2016-07-18: qty 1

## 2016-07-18 MED ORDER — FUROSEMIDE 40 MG PO TABS: 40.0000 mg | ORAL_TABLET | Freq: Every day | ORAL | Status: DC

## 2016-07-18 MED ORDER — LOPERAMIDE HCL 2 MG PO CAPS: 2.0000 mg | ORAL_CAPSULE | Freq: Every day | ORAL | Status: DC | PRN

## 2016-07-18 MED ORDER — PANTOPRAZOLE SODIUM 40 MG IV SOLR
40.0000 mg | Freq: Two times a day (BID) | INTRAVENOUS | Status: DC
  Administered 2016-07-18 – 2016-07-22 (×10): 40 mg via INTRAVENOUS
  Filled 2016-07-18 (×9): qty 40

## 2016-07-18 MED ORDER — SODIUM CHLORIDE 0.9 % IV BOLUS (SEPSIS)
500.0000 mL | Freq: Once | INTRAVENOUS | Status: AC
  Administered 2016-07-19: 500 mL via INTRAVENOUS

## 2016-07-18 MED ORDER — VANCOMYCIN HCL IN DEXTROSE 1-5 GM/200ML-% IV SOLN
1000.0000 mg | Freq: Once | INTRAVENOUS | Status: AC
  Administered 2016-07-18: 1000 mg via INTRAVENOUS
  Filled 2016-07-18: qty 200

## 2016-07-18 MED ORDER — CYANOCOBALAMIN 500 MCG PO TABS
500.0000 ug | ORAL_TABLET | Freq: Every day | ORAL | Status: DC
  Administered 2016-07-19 – 2016-07-24 (×6): 500 ug via ORAL
  Filled 2016-07-18 (×6): qty 1

## 2016-07-18 MED ORDER — ACETAMINOPHEN 325 MG PO TABS
650.0000 mg | ORAL_TABLET | Freq: Once | ORAL | Status: DC
  Filled 2016-07-18: qty 2

## 2016-07-18 MED ORDER — ONDANSETRON HCL 4 MG PO TABS: 4.0000 mg | ORAL_TABLET | Freq: Four times a day (QID) | ORAL | Status: DC | PRN

## 2016-07-18 MED ORDER — LEVALBUTEROL HCL 0.63 MG/3ML IN NEBU: 0.6300 mg | INHALATION_SOLUTION | RESPIRATORY_TRACT | Status: DC | PRN

## 2016-07-18 MED ORDER — IOPAMIDOL (ISOVUE-300) INJECTION 61%
INTRAVENOUS | Status: AC
  Filled 2016-07-18: qty 75

## 2016-07-18 MED ORDER — ONDANSETRON HCL 4 MG/2ML IJ SOLN: 4.0000 mg | Freq: Four times a day (QID) | INTRAMUSCULAR | Status: DC | PRN

## 2016-07-18 MED ORDER — NITROGLYCERIN 0.2 MG/HR TD PT24
0.2000 mg | MEDICATED_PATCH | Freq: Every day | TRANSDERMAL | Status: DC
  Administered 2016-07-19 – 2016-07-24 (×6): 0.2 mg via TRANSDERMAL
  Filled 2016-07-18 (×6): qty 1

## 2016-07-18 MED ORDER — POTASSIUM CHLORIDE CRYS ER 20 MEQ PO TBCR: 20.0000 meq | EXTENDED_RELEASE_TABLET | Freq: Two times a day (BID) | ORAL | Status: DC

## 2016-07-18 MED ORDER — PIPERACILLIN-TAZOBACTAM 3.375 G IVPB 30 MIN
3.3750 g | Freq: Once | INTRAVENOUS | Status: AC
  Administered 2016-07-18: 3.375 g via INTRAVENOUS
  Filled 2016-07-18: qty 50

## 2016-07-18 MED ORDER — INSULIN ASPART 100 UNIT/ML ~~LOC~~ SOLN
2.0000 [IU] | Freq: Two times a day (BID) | SUBCUTANEOUS | Status: DC
  Administered 2016-07-20: 2 [IU] via SUBCUTANEOUS
  Administered 2016-07-21 – 2016-07-22 (×2): 4 [IU] via SUBCUTANEOUS
  Administered 2016-07-22: 2 [IU] via SUBCUTANEOUS
  Administered 2016-07-23: 4 [IU] via SUBCUTANEOUS
  Administered 2016-07-23 – 2016-07-24 (×2): 2 [IU] via SUBCUTANEOUS

## 2016-07-18 NOTE — Progress Notes (Signed)
CRITICAL VALUE ALERT  Critical value received: Lactic Acid 3.2;Troponin 0.03  Date of notification:  07/18/16  Time of notification: 2325  Critical value read back:yes  Nurse who received alert:  Dellie Catholic  MD notified (1st page):  yes  Time of first page:  2329

## 2016-07-18 NOTE — ED Notes (Signed)
One set of blood cultures obtained by this RN

## 2016-07-18 NOTE — Progress Notes (Signed)
Patient admitted to room 1442.  Receiving nurse called ED to see if the Nurse bedside swallow screen had been completed in the ED. Nurse awaiting a return call from ED.

## 2016-07-18 NOTE — H&P (Signed)
History and Physical    Sydney [name] [id number] DOB: 1921/07/01 DOA: 07/18/2016  Referring MD/NP/PA: Domenic Moras, PA-C PCP: Geoffery Lyons, MD  Patient coming from: [facility] facility  Chief Complaint: Confusion and fever  HPI: Sydney [name] is a 82 y.o. female with medical history significant of  Afib on Eliquis, HTN, HLD, DM type II, Hyopothyroidism, left BKA, COPD, anemia; who presented with reports of confusion and fever. The patient's daughter is present at bedside and helps provide history as patient is acutely altered. Patient was last seen relatively normal approximately 3 days ago. Review of records notes that the patient had been progressively more confused over the last 3 days. She had complained of not feeling well yesterday and complained of some chest discomfort after eating aan onionfor which she was given acid reflux medication. Today nursing staff noted that the patient had a fever of 1030F and had acute onset of nausea and vomiting. Associated symptoms include some mention of abdominal pain. At baseline patient is mainly wheelchair bound and can stand to pivot and transition. Patient also was noted to have some expressive aphasia that is otherwise unchanged. Family denies any significant diarrhea, cough, dysuria, or signs of focal weakness.  ED Course: Upon admission into the emergency department patient was seen to be febrile to 102.30F, respirations 19-22, and all other vital signs relatively within normal limits. Lab work revealed WBC 8.7, hemoglobin 11, platelets 141, BUN 20, creatinine 1.4, AST 1047, ALT 493, AP 296, total bilirubin 2.7, ammonia 17, and lactic acid 3.19. Chest x-ray showed cardiomegaly. UA showed no bacteria, small leukocytes, 0-5 squamous epithelial cells, 6-30 Wbcs. CT scan of the abdomen and pelvis showed possible ascending cholangitis and/or mild acute diverticulitis. Patient was started on broad-spectrum antibiotics of vancomycin and Zosyn for  sepsis of unknown source.   Review of Systems: As per HPI otherwise 10 point review of systems negative.   Past Medical History:  Diagnosis Date  . Anemia   . Blood transfusion ~ 2012   "once; not related to a surgery or bleeding that I remember" (06/06/2013)  . Bronchial pneumonia    "once; several years ago" (06/06/2013)  . CHF (congestive heart failure) (HCC)    takes Lasix every other day  . Chronic atrial fibrillation (HCC)    takes Coumadin as instructed  . COPD (chronic obstructive pulmonary disease) ([place])   . Diabetes mellitus    takes Metformin and Amaryl daily  . Emphysema   . History of head injury    many yrs ago  . Hypercholesterolemia    doesn't require meds for this  . Hypertension    takes Prinizide daily  . Hypothyroidism    takes Synthroid daily  . Lung disease    BRONCHOSPASTIC  . Macular degeneration    dry  . Neuropathy (HCC)    takes Gabapentin bid  . Peripheral vascular disease ([place])   . Rheumatoid arthritis ([place])    "shoulders; daughter gives me shots of Methotrexate q week" (06/06/2013)  . Vaginal discharge    has seen GYN but says its nothing to worry about    Past Surgical History:  Procedure Laterality Date  . AMPUTATION  11/09/2011   Procedure: AMPUTATION DIGIT;  Surgeon: Mcarthur Rossetti, MD;  Location: [facility];  Service: Orthopedics;  Laterality: Right;  Right Great Toe Amputation  . AMPUTATION  01/05/2012   Procedure: AMPUTATION RAY;  Surgeon: Mcarthur Rossetti, MD;  Location: [facility];  Service: Orthopedics;  Laterality: Left;  Amputation Left 4th and 5th Toes  . AMPUTATION  05/31/2012   Procedure: AMPUTATION RAY;  Surgeon: Newt Minion, MD;  Location: [facility];  Service: Orthopedics;  Laterality: Left;  Left foot 1st ray amp  . AMPUTATION  06/21/2012   Procedure: AMPUTATION BELOW KNEE;  Surgeon: Newt Minion, MD;  Location: [facility];  Service: Orthopedics;  Laterality: Left;  Left Below Knee Amputation  . AMPUTATION Right 06/06/2013    Procedure: RIGHT MIDFOOT AMPUTATION;  Surgeon: Newt Minion, MD;  Location: [facility];  Service: Orthopedics;  Laterality: Right;  . APPENDECTOMY    . BACK SURGERY    . CARDIOVERSION    . CATARACT EXTRACTION W/ INTRAOCULAR LENS  IMPLANT, BILATERAL Bilateral   . CERVICAL DISC SURGERY     "the top vertebrae" (06/06/2013)  . CHOLECYSTECTOMY  1951  . DILATION AND CURETTAGE OF UTERUS    . EYE SURGERY     bilateral -cataract surgery  . FOOT AMPUTATION THROUGH METATARSAL Right 06/06/2013   midfoot/notes 06/06/2013  . FRACTURE SURGERY  2011   LEFT HIP FX /REPLACEMENT   . JOINT REPLACEMENT    . TOTAL ABDOMINAL HYSTERECTOMY  1975  . TOTAL HIP ARTHROPLASTY Left ?2013     reports that she has quit smoking. Her smoking use included Cigarettes. She quit after 4.00 years of use. She has never used smokeless tobacco. She reports that she does not drink alcohol or use drugs.  Allergies  Allergen Reactions  . Other     Narcotics=makes her very disoriented  . Percocet [Oxycodone-Acetaminophen] Other (See Comments)    Dilerium, hallucinations  . Tramadol Anxiety  . Vicodin [Hydrocodone-Acetaminophen] Anxiety    Family History  Problem Relation Age of Onset  . Heart disease Mother   . Stroke Mother   . Heart disease Father   . Stroke Father   . Heart attack Father   . Muscular dystrophy Brother   . Anesthesia problems Neg Hx   . Hypotension Neg Hx   . Malignant hyperthermia Neg Hx   . Pseudochol deficiency Neg Hx     Prior to Admission medications   Medication Sig Start Date End Date Taking? Authorizing Provider  acetaminophen (TYLENOL) 650 MG suppository Place 650 mg rectally every 4 (four) hours as needed for mild pain or moderate pain.   Yes Historical Provider, MD  apixaban (ELIQUIS) 2.5 MG TABS tablet Take 2.5 mg by mouth 2 (two) times daily.   Yes Historical Provider, MD  diclofenac sodium (VOLTAREN) 1 % GEL Apply 2 g topically 4 (four) times daily.   Yes Historical Provider, MD    diltiazem (CARDIZEM) 60 MG tablet Take 60 mg by mouth 2 (two) times daily.   Yes Historical Provider, MD  folic acid (FOLVITE) 1 MG tablet Take 1 mg by mouth daily.   Yes Historical Provider, MD  furosemide (LASIX) 20 MG tablet Take 40 mg by mouth daily.   Yes Historical Provider, MD  gabapentin (NEURONTIN) 300 MG capsule Take 300 mg by mouth 2 (two) times daily.   Yes Historical Provider, MD  insulin lispro (HUMALOG) 100 UNIT/ML KiwkPen Inject 2-8 Units into the skin 2 (two) times daily. Glucose:  0-100= 0 units 101-150= 2 units 151-200- 4 units 201-250= 6 units > 250= 8 units   Yes Historical Provider, MD  levothyroxine (SYNTHROID, LEVOTHROID) 50 MCG tablet Take 50 mcg by mouth daily with breakfast.    Yes Historical Provider, MD  loperamide (IMODIUM) 2 MG capsule Take 2 mg by  mouth daily as needed for diarrhea or loose stools.  04/09/15  Yes Historical Provider, MD  metFORMIN (GLUCOPHAGE) 500 MG tablet Take 500 mg by mouth daily. 07/12/15  Yes Historical Provider, MD  methotrexate 50 MG/2ML injection Inject 0.4 mLs into the muscle once a week. 06/07/15  Yes Historical Provider, MD  Multiple Vitamin (MULITIVITAMIN WITH MINERALS) TABS Take 1 tablet by mouth daily.   Yes Historical Provider, MD  Multiple Vitamins-Minerals (ICAPS PO) Take 1 capsule by mouth daily.    Yes Historical Provider, MD  mupirocin ointment (BACTROBAN) 2 % Apply 1 application topically daily as needed (wound care).  07/13/15  Yes Historical Provider, MD  nitroGLYCERIN (NITRODUR - DOSED IN MG/24 HR) 0.2 mg/hr patch Place 1 patch onto the skin daily. 06/21/15  Yes Historical Provider, MD  potassium chloride SA (K-DUR,KLOR-CON) 20 MEQ tablet Take 20 mEq by mouth 2 (two) times daily.   Yes Historical Provider, MD  tiotropium (SPIRIVA) 18 MCG inhalation capsule Place 18 mcg into inhaler and inhale daily with breakfast.    Yes Historical Provider, MD  vitamin B-12 (CYANOCOBALAMIN) 500 MCG tablet Take 500 mcg by mouth daily.   Yes  Historical Provider, MD  vitamin C (ASCORBIC ACID) 500 MG tablet Take 500 mg by mouth daily.   Yes Historical Provider, MD  pantoprazole (PROTONIX) 40 MG tablet Take 1 tablet (40 mg total) by mouth daily. Patient not taking: Reported on 07/18/2016 04/02/14   Lelon Perla, MD    Physical Exam:    Constitutional: Elderly female who appears sick, but nontoxic. Currently lethargic. Vitals:   07/18/16 1620 07/18/16 1715 07/18/16 1730 07/18/16 1735  BP:  (!) 110/50 (!) 108/49 108/56  Pulse:  85 85 89  Resp:  22 21 21   Temp:      TempSrc:      SpO2: 99% 97% 97% 100%   Eyes: PERRL, lids and conjunctivae normal ENMT: Mucous membranes are dry. Posterior pharynx clear of any exudate or lesions. Neck: normal, supple, no masses, no thyromegaly Respiratory: clear to auscultation bilaterally, no wheezing, no crackles. Normal respiratory effort. No accessory muscle use.  Cardiovascular: Irregular irregular, no murmurs / rubs / gallops. No extremity edema. 2+ pedal pulses. No carotid bruits.  Abdomen: no tenderness, no masses palpated. No hepatosplenomegaly. Bowel sounds positive.  Musculoskeletal: no clubbing / cyanosis. Left BKA and right foot ray amputation. Good ROM, no contractures. Generalized muscle atrophy Skin: no rashes, lesions, ulcers. No induration Neurologic: CN 2-12 grossly intact.  No facial droop present. Expressive aphasia noted. Psychiatric:  Lethargic.     Labs on Admission: I have personally reviewed following labs and imaging studies  CBC:  Recent Labs Lab 07/18/16 1602  WBC 8.7  NEUTROABS 7.8*  HGB 11.0*  HCT 33.6*  MCV 97.1  PLT [id number]*   Basic Metabolic Panel:  Recent Labs Lab 07/18/16 1602  NA 141  K 4.1  CL 104  CO2 26  GLUCOSE 169*  BUN 20  CREATININE 1.40*  CALCIUM 9.3   GFR: CrCl cannot be calculated (Unknown ideal weight.). Liver Function Tests:  Recent Labs Lab 07/18/16 1602  AST 1,047*  ALT 493*  ALKPHOS 296*  BILITOT 2.7*  PROT  6.3*  ALBUMIN 4.2   No results for input(s): LIPASE, AMYLASE in the last 168 hours.  Recent Labs Lab 07/18/16 1709  AMMONIA 17   Coagulation Profile:  Recent Labs Lab 07/18/16 1602  INR 1.44   Cardiac Enzymes: No results for input(s): CKTOTAL, CKMB, CKMBINDEX, TROPONINI in  the last 168 hours. BNP (last 3 results) No results for input(s): PROBNP in the last 8760 hours. HbA1C: No results for input(s): HGBA1C in the last 72 hours. CBG: No results for input(s): GLUCAP in the last 168 hours. Lipid Profile: No results for input(s): CHOL, HDL, LDLCALC, TRIG, CHOLHDL, LDLDIRECT in the last 72 hours. Thyroid Function Tests: No results for input(s): TSH, T4TOTAL, FREET4, T3FREE, THYROIDAB in the last 72 hours. Anemia Panel: No results for input(s): VITAMINB12, FOLATE, FERRITIN, TIBC, IRON, RETICCTPCT in the last 72 hours. Urine analysis:    Component Value Date/Time   COLORURINE YELLOW 07/18/2016 1600   APPEARANCEUR HAZY (A) 07/18/2016 1600   LABSPEC 1.009 07/18/2016 1600   PHURINE 5.0 07/18/2016 1600   GLUCOSEU NEGATIVE 07/18/2016 1600   HGBUR NEGATIVE 07/18/2016 1600   BILIRUBINUR NEGATIVE 07/18/2016 1600   KETONESUR NEGATIVE 07/18/2016 1600   PROTEINUR NEGATIVE 07/18/2016 1600   UROBILINOGEN 0.2 11/10/2013 1210   NITRITE NEGATIVE 07/18/2016 1600   LEUKOCYTESUR SMALL (A) 07/18/2016 1600   Sepsis Labs: No results found for this or any previous visit (from the past 240 hour(s)).   Radiological Exams on Admission: Ct Abdomen Pelvis W Contrast  Result Date: 07/18/2016 CLINICAL DATA:  Sepsis, transaminitis. Fever with increased lethargy. EXAM: CT ABDOMEN AND PELVIS WITH CONTRAST TECHNIQUE: Multidetector CT imaging of the abdomen and pelvis was performed using the standard protocol following bolus administration of intravenous contrast. CONTRAST:  78mL ISOVUE-300 IOPAMIDOL (ISOVUE-300) INJECTION 61% COMPARISON:  None. FINDINGS: Lower chest: Multi chamber cardiomegaly. No  pleural fluid or focal airspace disease. Tiny subpleural nodularity in the periphery of the left lower lobe without suspicious characteristics. Atherosclerosis of the descending thoracic aorta. Coronary artery calcifications are seen. Hepatobiliary: Postcholecystectomy. Prominent common bile duct measuring 13 mm at the porta hepatis, mild central intrahepatic biliary ductal dilatation. No calcified choledocholithiasis. Questionable common bile duct wall enhancement, an equivocal finding. No focal hepatic lesion. Pancreas: Fatty atrophy. No ductal dilatation or surrounding inflammation. Spleen:  Normal in size and density.  Probable punctate granuloma. Adrenals/Urinary Tract: No adrenal nodule. Bilateral renal cortical thinning without hydronephrosis. Motion artifact limits assessment for perinephric edema. No focal renal lesion. The ureters are decompressed. No excretion on delayed phase imaging consistent with renal dysfunction. Urinary bladder is decompressed and not well evaluated secondary to streak artifact from adjacent left hip prosthesis. Stomach/Bowel: The stomach is decompressed. No small bowel dilatation or inflammation. The appendix is not visualized, reportedly surgically absent. Small volume of colonic stool. There is diverticulosis involving the descending and sigmoid colon. Minimal soft tissue stranding in the deep right pelvis is adjacent to the sigmoid colon. Stranding from left hip prosthesis partially obscures detailed evaluation. No extraluminal air or abscess. Vascular/Lymphatic: Advanced atherosclerosis of the abdominal aorta and its branches. Calcified plaque leads to luminal narrowing of the distal abdominal aorta just proximal to the iliac bifurcation. Atherosclerotic calcifications of the intra-abdominal vasculature. No definite intra-abdominal or pelvic adenopathy. Small retroperitoneal lymph nodes are not enlarged by size criteria. Reproductive: The uterus is surgically absent. Neither  ovary is confidently identified. Other: No ascites or free air. Tiny fat containing umbilical hernia. Musculoskeletal: Mild T12 superior endplate compression fracture is chronic based on chest radiograph 06/18/2013. The bones are under mineralized. Multilevel degenerative change throughout the lumbar spine. Left hip prosthesis in place. No acute osseous abnormality is seen. IMPRESSION: 1. Mild biliary prominence postcholecystectomy. This is a common postsurgical finding. There is equivocal biliary enhancement, which may be normal for this patient or represent ascending cholangitis.  No calcified choledocholithiasis. 2. Distal descending and sigmoid colonic diverticulosis. Mild stranding in the deep right pelvis may be secondary to mild uncomplicated acute diverticulitis, however postsurgical scarring given prior hysterectomy could have a similar appearance. No abscess or perforation. 3. Advanced atherosclerosis. 4. Thinning of the renal parenchyma and absence of renal excretion on delayed phase imaging is consistent with chronic renal disease. 5. Osteopenia/osteoporosis.  Chronic T12 compression fracture. Electronically Signed   By: Jeb Levering M.D.   On: 07/18/2016 18:39   Dg Chest Port 1 View  Result Date: 07/18/2016 CLINICAL DATA:  FF sepsis, fever EXAM: PORTABLE CHEST 1 VIEW COMPARISON:  02/07/2014 FINDINGS: Cardiomegaly. No confluent airspace opacities or effusions. No acute bony abnormality. IMPRESSION: Cardiomegaly.  No acute findings. Electronically Signed   By: Rolm Baptise M.D.   On: 07/18/2016 16:59    EKG: Independently reviewed. Incomplete EKG, but appears patient in atrial fibrillation at 89 bpm  Assessment/Plan Sepsis due to undetermined organism [facility]): Acute. Patient presented with acute onset of confusion, nausea, vomiting, and fever. Imaging studies revealed the possibility of ascending cholangitisas well as acute diverticulitis. Patient was given 1.25 L bolus of IV fluids in the ED  and empiric antibiotics of vancomycin and Zosyn.  - Admit to a telemetry bed - Sepsis protocol initiated - Follow-up blood and urine culture  - Check influenza screen given high fever - Continued empiric antibiotics of vancomycin and Zosyn, and de-escalate when medically appropriate  - Trend lactic acid levels - Ibuprofen prn fever overnight x total of 2 doses - May warrant adding back Tylenol of liver function returns to normal or kidney function worsen  Acute encephalopathy: Family notes patient more confused, but note patient has expressive aphasia at baseline. Suspect patient has some underlying metabolic encephalopathy. - Neuro checks - Check bedside swallow prior to starting diet ordered - May warrant CT scan if not improving with current management - Head of bed at 30 I  Transaminitis with hyperbilirubinemia: Acute. Patient with significantly elevated AST, ALT, alkaline phosphatase, and total bilirubin. Question if patient has some aspect of ascending cholangitis with the overall acute presentation including fever. - Recheck CMP in a.m. - Continue IV fluids 75 ml/hr  - Dr. Michail Sermon of gastroenterology to see in a.m.  Chronic atrial fibrillation: Currently rate controlled on Eliquis - Continue diltiazem and Eliquis  Chronic kidney disease stage III: Patient's previous baseline noted to be around 1.28 over 2 years ago. She presents tonight with BUN at the upper limit of normal and creatinine at 1.40. - Follow-up kidney function in a.m.   Diabetes mellitus type 2: Patient on metformin and sliding scale insulin at home.  - Hypoglycemic protocol - Hold metformin  - Continue home insulin sliding scale regimen with CBGs twice daily - Continue gabapentin  Essential hypertension - will need to restart Lasix and potassium chloride when medically appropriate  COPD  - Continuous pulse oximetry overnight with nasal cannula oxygen to keep O2 sats greater than 92% - Continue Spiriva -  Xopenex neb prn SOB/Wheezing  Anemia: Chronic. Hemoglobin 11 which appears near patient's baseline. - Continue to monitor  History of CHF: last echo back in 06/2015 - Strict I&O's   - Monitoring overnight for signs of fluid overload and will give IV Lasix if needed  Hypothyroidism - check TSH - Continue levothyroxine  GERD - protonix IV   DVT prophylaxis: Eliquis Code Status: Full Family Communication: discussed overall plan of care with patient and daughter  Disposition Plan: likely discharge  back to nursing facility once medically stable Consults called:  GI  Admission status: Stepdown Inpatient  Norval Morton MD [facility] Pager [phone]  If 7PM-7AM, please contact night-coverage [email or web address] Password [facility]  07/18/2016, 7:27 PM

## 2016-07-18 NOTE — ED Provider Notes (Signed)
[facility] DEPT Provider Note   CSN: [id number] Arrival date & time: 07/18/16  1541     History   Chief Complaint No chief complaint on file.   HPI Sydney [name] is a 82 y.o. female.  HPI   82 year old female with history of atrial fibrillation currently on warfarin, non-insulin-dependent diabetes, COPD, anemia, CHF brought here via EMS from [place] for evaluation of fever and confusion.  Per pt's nurse, Reymundo Poll from her facility, pt has been more confused for the past 3 days. She c/o chest pain after eating onion yesterday and was given GERD medication.  Today staff noticed pt has a fever of 103, and sent her here for further evaluation.  Staff have not notice any cough, vomiting, diarrhea, or dysuria. However, daughter who is at bedside did report pt vomited a few times today and mentioned she doesn't feel well since yesterday. Pt did report having abd pain. Level V caveats for suspect delirium.    Past Medical History:  Diagnosis Date  . Anemia   . Blood transfusion ~ 2012   "once; not related to a surgery or bleeding that I remember" (06/06/2013)  . Bronchial pneumonia    "once; several years ago" (06/06/2013)  . CHF (congestive heart failure) (HCC)    takes Lasix every other day  . Chronic atrial fibrillation (HCC)    takes Coumadin as instructed  . COPD (chronic obstructive pulmonary disease) ([place])   . Diabetes mellitus    takes Metformin and Amaryl daily  . Emphysema   . History of head injury    many yrs ago  . Hypercholesterolemia    doesn't require meds for this  . Hypertension    takes Prinizide daily  . Hypothyroidism    takes Synthroid daily  . Lung disease    BRONCHOSPASTIC  . Macular degeneration    dry  . Neuropathy (HCC)    takes Gabapentin bid  . Peripheral vascular disease ([place])   . Rheumatoid arthritis ([place])    "shoulders; daughter gives me shots of Methotrexate q week" (06/06/2013)  . Vaginal discharge    has seen GYN  but says its nothing to worry about    Patient Active Problem List   Diagnosis Date Noted  . Foot ulcer, right ([place]) 02/08/2014    Class: Chronic  . PVD (peripheral vascular disease) ([place]) 02/08/2014    Class: Chronic  . Acute chest pain 02/07/2014  . HCAP (healthcare-associated pneumonia) 11/10/2013  . Pneumonia 11/10/2013  . Acute diastolic heart failure ([place]) 06/18/2013  . Acute on chronic anemia 06/09/2013  . Acute renal insufficiency 06/07/2013    Class: Acute  . Status post transmetatarsal amputation of right foot ([place]) 06/07/2013    Class: Acute  . Chest pain 04/29/2013  . Phantom limb pain ([place]) 07/24/2012  . Diabetic neuropathy ([place]) 07/24/2012  . Unilateral complete BKA-left 06/24/2012  . Osteomyelitis of toe ([place]) 01/05/2012  . Osteomyelitis of toe of left foot ([place]) 01/05/2012  . Chronic osteomyelitis of toe ([place]) 11/09/2011  . HYPOTHYROIDISM 06/11/2007  . DIABETES MELLITUS, TYPE II 06/11/2007  . HYPERLIPIDEMIA 06/11/2007  . Essential hypertension 06/11/2007  . Atrial fibrillation ([place]) 06/11/2007  . COPD 06/11/2007    Past Surgical History:  Procedure Laterality Date  . AMPUTATION  11/09/2011   Procedure: AMPUTATION DIGIT;  Surgeon: Mcarthur Rossetti, MD;  Location: [facility];  Service: Orthopedics;  Laterality: Right;  Right Great Toe Amputation  . AMPUTATION  01/05/2012   Procedure: AMPUTATION RAY;  Surgeon: Mcarthur Rossetti, MD;  Location: [facility];  Service: Orthopedics;  Laterality: Left;  Amputation Left 4th and 5th Toes  . AMPUTATION  05/31/2012   Procedure: AMPUTATION RAY;  Surgeon: Newt Minion, MD;  Location: [facility];  Service: Orthopedics;  Laterality: Left;  Left foot 1st ray amp  . AMPUTATION  06/21/2012   Procedure: AMPUTATION BELOW KNEE;  Surgeon: Newt Minion, MD;  Location: [facility];  Service: Orthopedics;  Laterality: Left;  Left Below Knee Amputation  . AMPUTATION Right 06/06/2013   Procedure: RIGHT MIDFOOT AMPUTATION;  Surgeon: Newt Minion, MD;  Location: [facility];  Service: Orthopedics;  Laterality: Right;  . APPENDECTOMY    . BACK SURGERY    . CARDIOVERSION    . CATARACT EXTRACTION W/ INTRAOCULAR LENS  IMPLANT, BILATERAL Bilateral   . CERVICAL DISC SURGERY     "the top vertebrae" (06/06/2013)  . CHOLECYSTECTOMY  1951  . DILATION AND CURETTAGE OF UTERUS    . EYE SURGERY     bilateral -cataract surgery  . FOOT AMPUTATION THROUGH METATARSAL Right 06/06/2013   midfoot/notes 06/06/2013  . FRACTURE SURGERY  2011   LEFT HIP FX /REPLACEMENT   . JOINT REPLACEMENT    . TOTAL ABDOMINAL HYSTERECTOMY  1975  . TOTAL HIP ARTHROPLASTY Left ?2013    OB History    No data available       Home Medications    Prior to Admission medications   Medication Sig Start Date End Date Taking? Authorizing Provider  acetaminophen (TYLENOL) 500 MG tablet Take 500 mg by mouth every 8 (eight) hours as needed for mild pain. Take 2 tablets three times each day.    Historical Provider, MD  Calcium-Vitamin D-Vitamin K ([employer] CALCIUM SOFT CHEWS PO) Take 1 tablet by mouth 2 (two) times daily.     Historical Provider, MD  cyanocobalamin 500 MCG tablet Take 500 mcg by mouth daily.    Historical Provider, MD  diclofenac sodium (VOLTAREN) 1 % GEL Apply 2 g topically 4 (four) times daily.    Historical Provider, MD  diltiazem (CARDIZEM) 60 MG tablet Take 60 mg by mouth 2 (two) times daily.    Historical Provider, MD  folic acid (FOLVITE) 1 MG tablet Take 1 mg by mouth daily.    Historical Provider, MD  furosemide (LASIX) 20 MG tablet Take 40 mg by mouth daily.    Historical Provider, MD  gabapentin (NEURONTIN) 300 MG capsule Take 300 mg by mouth 2 (two) times daily.    Historical Provider, MD  insulin lispro (HUMALOG) 100 UNIT/ML KiwkPen Inject 100 Units into the skin 2 (two) times daily.    Historical Provider, MD  iron polysaccharides (NIFEREX) 150 MG capsule Take 150 mg by mouth daily.    Historical Provider, MD  levothyroxine (SYNTHROID, LEVOTHROID)  50 MCG tablet Take 50 mcg by mouth daily with breakfast.     Historical Provider, MD  loperamide (IMODIUM) 2 MG capsule Take 2 mg by mouth daily as needed. diarrhea 04/09/15   Historical Provider, MD  metFORMIN (GLUCOPHAGE) 500 MG tablet Take 500 mg by mouth daily. 07/12/15   Historical Provider, MD  methotrexate 50 MG/2ML injection Inject 0.4 mLs into the muscle once a week. 06/07/15   Historical Provider, MD  Multiple Vitamin (MULITIVITAMIN WITH MINERALS) TABS Take 1 tablet by mouth daily.    Historical Provider, MD  Multiple Vitamins-Minerals (ICAPS PO) Take 1 drop by mouth daily.    Historical Provider, MD  mupirocin ointment (  BACTROBAN) 2 % Apply 1 application topically daily. 07/13/15   Historical Provider, MD  nitroGLYCERIN (NITRODUR - DOSED IN MG/24 HR) 0.2 mg/hr patch Place 1 patch onto the skin daily. 06/21/15   Historical Provider, MD  pantoprazole (PROTONIX) 40 MG tablet Take 1 tablet (40 mg total) by mouth daily. 04/02/14   Lelon Perla, MD  potassium chloride SA (K-DUR,KLOR-CON) 20 MEQ tablet Take 20 mEq by mouth 2 (two) times daily.    Historical Provider, MD  protein supplement (RESOURCE BENEPROTEIN) POWD Take 1 scoop by mouth 3 (three) times daily with meals.     Historical Provider, MD  tiotropium (SPIRIVA) 18 MCG inhalation capsule Place 18 mcg into inhaler and inhale daily with breakfast.     Historical Provider, MD  triamcinolone cream (KENALOG) 0.1 % Apply 1 application topically daily. 07/14/15   Historical Provider, MD  vitamin C (ASCORBIC ACID) 500 MG tablet Take 500 mg by mouth daily.    Historical Provider, MD  warfarin (COUMADIN) 2 MG tablet Take 1 mg by mouth daily. Monday, Wednesday, and Friday.    Historical Provider, MD  warfarin (COUMADIN) 3 MG tablet Take 2 mg by mouth daily. Tuesday and Thursday.    Historical Provider, MD    Family History Family History  Problem Relation Age of Onset  . Heart disease Mother   . Stroke Mother   . Heart disease Father   .  Stroke Father   . Heart attack Father   . Muscular dystrophy Brother   . Anesthesia problems Neg Hx   . Hypotension Neg Hx   . Malignant hyperthermia Neg Hx   . Pseudochol deficiency Neg Hx     Social History Social History  Substance Use Topics  . Smoking status: Former Smoker    Years: 4.00    Types: Cigarettes  . Smokeless tobacco: Never Used     Comment: 06/06/2013 "smoked a little in my teens"  . Alcohol use No     Allergies   Other; Percocet [oxycodone-acetaminophen]; Tramadol; and Vicodin [hydrocodone-acetaminophen]   Review of Systems Review of Systems  Unable to perform ROS: Mental status change     Physical Exam Updated Vital Signs There were no vitals taken for this visit.  Physical Exam  Constitutional: She appears well-developed and well-nourished. No distress.  HENT:  Head: Atraumatic.  Mouth/Throat: Oropharynx is clear and moist.  Eyes: Conjunctivae are normal.  Neck: Neck supple.  Cardiovascular:  Irregularly irregular heart rhythm with 2/6 systolic heart murmur.   Pulmonary/Chest: Effort normal and breath sounds normal. No respiratory distress. She has no wheezes. She has no rales.  Abdominal: Soft. She exhibits no distension. There is no tenderness.  Musculoskeletal:  L BKA, R foot metatarsal amputation.  No evidence of infection to lower extremities.   Neurological: She is alert. She exhibits normal muscle tone.  Pt is pleasant but with word salads, unable to tell name, place, or time.  Able to move all 4 extremities.  No facial droops.   Skin: Skin is warm. No rash noted.  Psychiatric: She has a normal mood and affect.  Nursing note and vitals reviewed.    ED Treatments / Results  Labs (all labs ordered are listed, but only abnormal results are displayed) Labs Reviewed  COMPREHENSIVE METABOLIC PANEL - Abnormal; Notable for the following:       Result Value   Glucose, Bld 169 (*)    Creatinine, Ser 1.40 (*)    Total Protein 6.3 (*)  AST 1,047 (*)    ALT 493 (*)    Alkaline Phosphatase 296 (*)    Total Bilirubin 2.7 (*)    GFR calc non Af Amer 31 (*)    GFR calc Af Amer 36 (*)    All other components within normal limits  CBC WITH DIFFERENTIAL/PLATELET - Abnormal; Notable for the following:    RBC 3.46 (*)    Hemoglobin 11.0 (*)    HCT 33.6 (*)    RDW 15.6 (*)    Platelets 141 (*)    Neutro Abs 7.8 (*)    Lymphs Abs 0.2 (*)    All other components within normal limits  URINALYSIS, ROUTINE W REFLEX MICROSCOPIC - Abnormal; Notable for the following:    APPearance HAZY (*)    Leukocytes, UA SMALL (*)    Squamous Epithelial / LPF 0-5 (*)    All other components within normal limits  PROTIME-INR - Abnormal; Notable for the following:    Prothrombin Time 17.6 (*)    All other components within normal limits  I-STAT CG4 LACTIC ACID, ED - Abnormal; Notable for the following:    Lactic Acid, Venous 3.19 (*)    All other components within normal limits  CULTURE, BLOOD (ROUTINE X 2)  CULTURE, BLOOD (ROUTINE X 2)  URINE CULTURE  AMMONIA  I-STAT TROPOININ, ED  I-STAT CG4 LACTIC ACID, ED    EKG  EKG Interpretation None     ED ECG REPORT   Date: 07/18/2016  Rate:89  Rhythm: normal sinus rhythm  QRS Axis: normal  Intervals: PR shortened  ST/T Wave abnormalities: nonspecific ST/T changes  Conduction Disutrbances:none  Narrative Interpretation: supraventricular bigeminy  Old EKG Reviewed: unchanged  I have personally reviewed the EKG tracing and agree with the computerized printout as noted.   Radiology Ct Abdomen Pelvis W Contrast  Result Date: 07/18/2016 CLINICAL DATA:  Sepsis, transaminitis. Fever with increased lethargy. EXAM: CT ABDOMEN AND PELVIS WITH CONTRAST TECHNIQUE: Multidetector CT imaging of the abdomen and pelvis was performed using the standard protocol following bolus administration of intravenous contrast. CONTRAST:  28mL ISOVUE-300 IOPAMIDOL (ISOVUE-300) INJECTION 61% COMPARISON:   None. FINDINGS: Lower chest: Multi chamber cardiomegaly. No pleural fluid or focal airspace disease. Tiny subpleural nodularity in the periphery of the left lower lobe without suspicious characteristics. Atherosclerosis of the descending thoracic aorta. Coronary artery calcifications are seen. Hepatobiliary: Postcholecystectomy. Prominent common bile duct measuring 13 mm at the porta hepatis, mild central intrahepatic biliary ductal dilatation. No calcified choledocholithiasis. Questionable common bile duct wall enhancement, an equivocal finding. No focal hepatic lesion. Pancreas: Fatty atrophy. No ductal dilatation or surrounding inflammation. Spleen:  Normal in size and density.  Probable punctate granuloma. Adrenals/Urinary Tract: No adrenal nodule. Bilateral renal cortical thinning without hydronephrosis. Motion artifact limits assessment for perinephric edema. No focal renal lesion. The ureters are decompressed. No excretion on delayed phase imaging consistent with renal dysfunction. Urinary bladder is decompressed and not well evaluated secondary to streak artifact from adjacent left hip prosthesis. Stomach/Bowel: The stomach is decompressed. No small bowel dilatation or inflammation. The appendix is not visualized, reportedly surgically absent. Small volume of colonic stool. There is diverticulosis involving the descending and sigmoid colon. Minimal soft tissue stranding in the deep right pelvis is adjacent to the sigmoid colon. Stranding from left hip prosthesis partially obscures detailed evaluation. No extraluminal air or abscess. Vascular/Lymphatic: Advanced atherosclerosis of the abdominal aorta and its branches. Calcified plaque leads to luminal narrowing of the distal abdominal aorta just proximal to  the iliac bifurcation. Atherosclerotic calcifications of the intra-abdominal vasculature. No definite intra-abdominal or pelvic adenopathy. Small retroperitoneal lymph nodes are not enlarged by size  criteria. Reproductive: The uterus is surgically absent. Neither ovary is confidently identified. Other: No ascites or free air. Tiny fat containing umbilical hernia. Musculoskeletal: Mild T12 superior endplate compression fracture is chronic based on chest radiograph 06/18/2013. The bones are under mineralized. Multilevel degenerative change throughout the lumbar spine. Left hip prosthesis in place. No acute osseous abnormality is seen. IMPRESSION: 1. Mild biliary prominence postcholecystectomy. This is a common postsurgical finding. There is equivocal biliary enhancement, which may be normal for this patient or represent ascending cholangitis. No calcified choledocholithiasis. 2. Distal descending and sigmoid colonic diverticulosis. Mild stranding in the deep right pelvis may be secondary to mild uncomplicated acute diverticulitis, however postsurgical scarring given prior hysterectomy could have a similar appearance. No abscess or perforation. 3. Advanced atherosclerosis. 4. Thinning of the renal parenchyma and absence of renal excretion on delayed phase imaging is consistent with chronic renal disease. 5. Osteopenia/osteoporosis.  Chronic T12 compression fracture. Electronically Signed   By: Jeb Levering M.D.   On: 07/18/2016 18:39   Dg Chest Port 1 View  Result Date: 07/18/2016 CLINICAL DATA:  FF sepsis, fever EXAM: PORTABLE CHEST 1 VIEW COMPARISON:  02/07/2014 FINDINGS: Cardiomegaly. No confluent airspace opacities or effusions. No acute bony abnormality. IMPRESSION: Cardiomegaly.  No acute findings. Electronically Signed   By: Rolm Baptise M.D.   On: 07/18/2016 16:59    Procedures Procedures (including critical care time)  Medications Ordered in ED Medications  acetaminophen (TYLENOL) tablet 650 mg (650 mg Oral Not Given 07/18/16 1650)  sodium chloride 0.9 % bolus 1,000 mL (1,000 mLs Intravenous New Bag/Given 07/18/16 1933)    And  sodium chloride 0.9 % bolus 250 mL (not administered)   iopamidol (ISOVUE-300) 61 % injection (not administered)  sodium chloride 0.9 % injection (not administered)  piperacillin-tazobactam (ZOSYN) IVPB 3.375 g (0 g Intravenous Stopped 07/18/16 1701)  vancomycin (VANCOCIN) IVPB 1000 mg/200 mL premix (0 mg Intravenous Stopped 07/18/16 1900)  sodium chloride 0.9 % bolus 1,000 mL (0 mLs Intravenous Stopped 07/18/16 1928)  ibuprofen (ADVIL,MOTRIN) tablet 600 mg (600 mg Oral Given 07/18/16 1656)  iopamidol (ISOVUE-300) 61 % injection 75 mL (50 mLs Intravenous Contrast Given 07/18/16 1754)     Initial Impression / Assessment and Plan / ED Course  I have reviewed the triage vital signs and the nursing notes.  Pertinent labs & imaging results that were available during my care of the patient were reviewed by me and considered in my medical decision making (see chart for details).  Clinical Course     BP 108/56   Pulse 81   Temp 98.6 F (37 C) (Oral)   Resp 21   SpO2 100%    Final Clinical Impressions(s) / ED Diagnoses   Final diagnoses:  Sepsis, due to unspecified organism [facility])    New Prescriptions New Prescriptions   No medications on file   4:12 PM Pt sent here from nursing facility for fever and AMS, concerning for sepsis causing delirium.  Work up initiated.    5:38 PM Pt has a fever of 102.3. She has had tylenol PTA.  Ibuprofen given.  Elevated lactic acid of 3.19.  Code Sepsis started, IVF 29m/kg given.  Broad spectrum abx started.  Labs remarkable for transaminitis AST 1,047, ALT 493, Alk phos 296 and total bili 2.7.  Pt has had cholecystectomy in the past.  Will obtain  abd/pelvis CT.  Ammonia level ordered.  UA without signs of UTI, CXR unremarkable.  Pt no longer taking Warfarin, she is on Eliquis instead.  Care discussed with Dr. Leonette Monarch  7:45 PM Abd/pelvis CT with mild biliary enhancement which may be normal or ascending cholangitis.  Mild stranding in deep right pelvis which may be mild uncomplicated diverticulitis or post  surgical scarring. Pt does not have any significant abdominal tenderness on my examination  Appreciate consultation from [facility] Dr. Fuller Plan who agrees to see pt in the ER and will admit to step down, inpatient.  He also request GI to be consulted.    Sepsis - Repeat Assessment  Performed at:    07:22pm  Vitals     Blood pressure 108/56, pulse 81, temperature 98.6 F (37 C), temperature source Oral, resp. rate 21, SpO2 100 %.  Heart:     Regular rate and rhythm  Lungs:    CTA  Capillary Refill:   <2 sec  Peripheral Pulse:   Radial pulse palpable  Skin:     Normal Color  8:00 PM Appreciate consultation from GI specialist, Dr. Michail Sermon who agrees to see pt in the morning.  Pt will be admitted.    CRITICAL CARE Performed by: Domenic Moras Total critical care time:45 minutes Critical care time was exclusive of separately billable procedures and treating other patients. Critical care was necessary to treat or prevent imminent or life-threatening deterioration. Critical care was time spent personally by me on the following activities: development of treatment plan with patient and/or surrogate as well as nursing, discussions with consultants, evaluation of patient's response to treatment, examination of patient, obtaining history from patient or surrogate, ordering and performing treatments and interventions, ordering and review of laboratory studies, ordering and review of radiographic studies, pulse oximetry and re-evaluation of patient's condition.    Domenic Moras, PA-C 07/18/16 2003    Fatima Blank, MD 07/20/16 [phone]

## 2016-07-18 NOTE — ED Triage Notes (Addendum)
Per EMS, pt is from [facility] home with a fever that staff reported as 103. Pt was becoming increasingly lethargic. Staff reports that pt is usually AOx4. Hx of diabetes, a fib, HF, and COPD. Staff reported that pt had not been recently sick.

## 2016-07-18 NOTE — Progress Notes (Signed)
Pharmacy Antibiotic Note  Sydney [name] is a 82 y.o. female admitted on 07/18/2016 with sepsis. Comes from nursing home with fever and increasingly lethargic.  Hx of diabetes, Afib, HF and COPD.  Pharmacy has been consulted for vancomycin and zosyn dosing.  Plan: Vancomycin 1gm IV x 1 in ED then 750mg  IV q24h Zosyn 3.375 x 1 in ED then Q8H infused over 4hrs. Follow renal function, cultures, clinical course  Weight: 145 lb (65.8 kg)  Temp (24hrs), Avg:100.5 F (38.1 C), Min:98.6 F (37 C), Max:102.3 F (39.1 C)   Recent Labs Lab 07/18/16 1602 07/18/16 1627  WBC 8.7  --   CREATININE 1.40*  --   LATICACIDVEN  --  3.19*    CrCl cannot be calculated (Unknown ideal weight.).    Allergies  Allergen Reactions  . Other     Narcotics=makes her very disoriented  . Percocet [Oxycodone-Acetaminophen] Other (See Comments)    Dilerium, hallucinations  . Tramadol Anxiety  . Vicodin [Hydrocodone-Acetaminophen] Anxiety    Antimicrobials this admission: 12/5 vanc >> 12/5 zosyn >>   Microbiology results: 12/5 BCx: sent 12/5 UCx: sent  Thank you for allowing pharmacy to be a part of this patient's care.  Dolly Rias RPh 07/18/2016, 8:32 PM Pager [phone]

## 2016-07-19 ENCOUNTER — Inpatient Hospital Stay (HOSPITAL_COMMUNITY): Payer: Medicare Other

## 2016-07-19 DIAGNOSIS — G9341 Metabolic encephalopathy: Secondary | ICD-10-CM

## 2016-07-19 DIAGNOSIS — I482 Chronic atrial fibrillation: Secondary | ICD-10-CM

## 2016-07-19 DIAGNOSIS — R6520 Severe sepsis without septic shock: Secondary | ICD-10-CM

## 2016-07-19 DIAGNOSIS — R740 Nonspecific elevation of levels of transaminase and lactic acid dehydrogenase [LDH]: Secondary | ICD-10-CM

## 2016-07-19 DIAGNOSIS — A419 Sepsis, unspecified organism: Secondary | ICD-10-CM

## 2016-07-19 DIAGNOSIS — R7401 Elevation of levels of liver transaminase levels: Secondary | ICD-10-CM | POA: Diagnosis present

## 2016-07-19 LAB — INFLUENZA PANEL BY PCR (TYPE A & B)
INFLAPCR: NEGATIVE
INFLBPCR: NEGATIVE

## 2016-07-19 LAB — BLOOD CULTURE ID PANEL (REFLEXED)
ACINETOBACTER BAUMANNII: NOT DETECTED
CANDIDA ALBICANS: NOT DETECTED
CANDIDA GLABRATA: NOT DETECTED
CANDIDA KRUSEI: NOT DETECTED
Candida parapsilosis: NOT DETECTED
Candida tropicalis: NOT DETECTED
Carbapenem resistance: NOT DETECTED
ENTEROBACTER CLOACAE COMPLEX: NOT DETECTED
ENTEROCOCCUS SPECIES: NOT DETECTED
ESCHERICHIA COLI: DETECTED — AB
Enterobacteriaceae species: DETECTED — AB
Haemophilus influenzae: NOT DETECTED
KLEBSIELLA OXYTOCA: NOT DETECTED
Klebsiella pneumoniae: NOT DETECTED
LISTERIA MONOCYTOGENES: NOT DETECTED
NEISSERIA MENINGITIDIS: NOT DETECTED
PSEUDOMONAS AERUGINOSA: NOT DETECTED
Proteus species: NOT DETECTED
SERRATIA MARCESCENS: NOT DETECTED
STREPTOCOCCUS AGALACTIAE: NOT DETECTED
STREPTOCOCCUS PNEUMONIAE: NOT DETECTED
STREPTOCOCCUS PYOGENES: NOT DETECTED
Staphylococcus aureus (BCID): NOT DETECTED
Staphylococcus species: NOT DETECTED
Streptococcus species: NOT DETECTED

## 2016-07-19 LAB — COMPREHENSIVE METABOLIC PANEL
ALT: 522 U/L — AB (ref 14–54)
AST: 666 U/L — ABNORMAL HIGH (ref 15–41)
Albumin: 3.2 g/dL — ABNORMAL LOW (ref 3.5–5.0)
Alkaline Phosphatase: 190 U/L — ABNORMAL HIGH (ref 38–126)
Anion gap: 12 (ref 5–15)
BILIRUBIN TOTAL: 3.2 mg/dL — AB (ref 0.3–1.2)
BUN: 22 mg/dL — AB (ref 6–20)
CALCIUM: 7.6 mg/dL — AB (ref 8.9–10.3)
CHLORIDE: 110 mmol/L (ref 101–111)
CO2: 19 mmol/L — ABNORMAL LOW (ref 22–32)
CREATININE: 1.79 mg/dL — AB (ref 0.44–1.00)
GFR, EST AFRICAN AMERICAN: 27 mL/min — AB (ref >60)
GFR, EST NON AFRICAN AMERICAN: 23 mL/min — AB (ref >60)
Glucose, Bld: 158 mg/dL — ABNORMAL HIGH (ref 65–99)
Potassium: 4.4 mmol/L (ref 3.5–5.1)
Sodium: 141 mmol/L (ref 135–145)
TOTAL PROTEIN: 5.2 g/dL — AB (ref 6.5–8.1)

## 2016-07-19 LAB — GLUCOSE, CAPILLARY
GLUCOSE-CAPILLARY: 187 mg/dL — AB (ref 65–99)
GLUCOSE-CAPILLARY: 144 mg/dL — AB (ref 65–99)

## 2016-07-19 LAB — CBC
HCT: 32.1 % — ABNORMAL LOW (ref 36.0–46.0)
Hemoglobin: 10 g/dL — ABNORMAL LOW (ref 12.0–15.0)
MCH: 32.1 pg (ref 26.0–34.0)
MCHC: 31.2 g/dL (ref 30.0–36.0)
MCV: 102.9 fL — ABNORMAL HIGH (ref 78.0–100.0)
PLATELETS: 99 10*3/uL — AB (ref 150–400)
RBC: 3.12 MIL/uL — AB (ref 3.87–5.11)
RDW: 16.2 % — AB (ref 11.5–15.5)
WBC: 12 10*3/uL — AB (ref 4.0–10.5)

## 2016-07-19 LAB — LACTIC ACID, PLASMA
LACTIC ACID, VENOUS: 3.9 mmol/L — AB (ref 0.5–1.9)
LACTIC ACID, VENOUS: 3.7 mmol/L — AB (ref 0.5–1.9)

## 2016-07-19 LAB — MRSA PCR SCREENING: MRSA by PCR: POSITIVE — AB

## 2016-07-19 LAB — TROPONIN I

## 2016-07-19 MED ORDER — HALOPERIDOL LACTATE 5 MG/ML IJ SOLN
1.0000 mg | Freq: Four times a day (QID) | INTRAMUSCULAR | Status: DC | PRN
Start: 2016-07-19 — End: 2016-07-24
  Administered 2016-07-19 – 2016-07-22 (×3): 1 mg via INTRAVENOUS
  Filled 2016-07-19 (×3): qty 1

## 2016-07-19 MED ORDER — SODIUM CHLORIDE 0.9 % IV BOLUS (SEPSIS)
250.0000 mL | Freq: Once | INTRAVENOUS | Status: AC
  Administered 2016-07-19: 250 mL via INTRAVENOUS

## 2016-07-19 MED ORDER — PIPERACILLIN-TAZOBACTAM IN DEX 2-0.25 GM/50ML IV SOLN
2.2500 g | INTRAVENOUS | Status: AC
  Administered 2016-07-19: 2.25 g via INTRAVENOUS
  Filled 2016-07-19: qty 50

## 2016-07-19 MED ORDER — SODIUM CHLORIDE 0.9 % IV SOLN
INTRAVENOUS | Status: DC
  Administered 2016-07-19: 75 mL/h via INTRAVENOUS
  Administered 2016-07-20 – 2016-07-21 (×2): via INTRAVENOUS

## 2016-07-19 MED ORDER — PIPERACILLIN-TAZOBACTAM IN DEX 2-0.25 GM/50ML IV SOLN
2.2500 g | Freq: Three times a day (TID) | INTRAVENOUS | Status: DC
  Administered 2016-07-19 – 2016-07-21 (×5): 2.25 g via INTRAVENOUS
  Filled 2016-07-19 (×6): qty 50

## 2016-07-19 MED ORDER — SODIUM CHLORIDE 0.9 % IV BOLUS (SEPSIS)
1000.0000 mL | Freq: Once | INTRAVENOUS | Status: AC
  Administered 2016-07-19: 1000 mL via INTRAVENOUS

## 2016-07-19 MED ORDER — SODIUM CHLORIDE 0.9% FLUSH
10.0000 mL | INTRAVENOUS | Status: DC | PRN
  Administered 2016-07-20: 10 mL
  Administered 2016-07-21: 20 mL
  Administered 2016-07-23: 10 mL
  Filled 2016-07-19 (×3): qty 40

## 2016-07-19 NOTE — Progress Notes (Signed)
Peripherally Inserted Central Catheter/Midline Placement  The IV Nurse has discussed with the patient and/or persons authorized to consent for the patient, the purpose of this procedure and the potential benefits and risks involved with this procedure.  The benefits include less needle sticks, lab draws from the catheter, and the patient may be discharged home with the catheter. Risks include, but not limited to, infection, bleeding, blood clot (thrombus formation), and puncture of an artery; nerve damage and irregular heartbeat and possibility to perform a PICC exchange if needed/ordered by physician.  Alternatives to this procedure were also discussed.  Bard Power PICC patient education guide, fact sheet on infection prevention and patient information card has been provided to patient /or left at bedside.    PICC/Midline Placement Documentation  PICC Double Lumen [id number] PICC Right Basilic 42 cm 1 cm (Active)  Indication for Insertion or Continuance of Line Poor Vasculature-patient has had multiple peripheral attempts or PIVs lasting less than 24 hours 07/19/2016  8:00 AM  Exposed Catheter (cm) 1 cm 07/19/2016  8:00 AM  Dressing Change Due 07/26/16 07/19/2016  8:00 AM    Telephone consent   Jule Economy Horton 07/19/2016, 8:46 AM

## 2016-07-19 NOTE — Progress Notes (Signed)
CRITICAL VALUE ALERT  Critical value received:  Lactic Acid 3.7  Date of notification:  07/19/16  Time of notification:  0600  Critical value read back:yes  Nurse who received alert: Dellie Catholic  MD notified (1st page): yes   Time of first page:  [phone]

## 2016-07-19 NOTE — Clinical Social Work Note (Signed)
Clinical Social Work Assessment  Patient Details  Name: Sydney [name] MRN: [id number] Date of Birth: May 03, 1921  Date of referral:  07/19/16               Reason for consult:  Discharge Planning                Permission sought to share information with:  Case Manager, Family Supports, Customer service manager Permission granted to share information::  Yes, Verbal Permission Granted  Name::      York Cerise Geophysical data processor)  Agency::   ([facility], ALF )  Relationship::   (Daughter )  Contact Information:   [phone])  Housing/Transportation Living arrangements for the past 2 months:  [place] of Information:  Adult Children Patient Interpreter Needed:  None Criminal Activity/Legal Involvement Pertinent to Current Situation/Hospitalization:  No - Comment as needed Significant Relationships:  Adult Children Lives with:  Facility Resident Do you feel safe going back to the place where you live?  Yes Need for family participation in patient care:  Yes (Comment)  Care giving concerns:  Patient admitted from [place] with plans to return at discharge.    Social Worker assessment / plan: MSW spoke with patient's dtr, York Cerise to confirm that patient was admitted from [place]. MSW introduced MSW role and process on returning to ALF. Patient's dtr confirmed facility and prefers for patient to return to ALF once medically stable for dc. MSW reviewed chart and noted patient currently does not have orders for PT/OT. Patient is also currently confused and oriented to person only. Patient's dtr stated that she would like for patient to return to ALF, however if patient truly needs SNF placement- dtr is okay with placement at [facility]. No further concerns reported at this time. FL-2 completed and faxed via [place]. MSW remains available as needed.   Employment status:  Retired Forensic scientist:  Medicare PT  Recommendations:  Not assessed at this time Information / Referral to community resources:   (none )  Patient/Family's Response to care:  Patient disoriented.  Dtr pleasant and agreeable to pt's return to ALF. Pt's dtr supportive, involved in care and appreciated social work intervention.   Patient/Family's Understanding of and Emotional Response to Diagnosis, Current Treatment, and Prognosis: Pt's dtr knowledgeable of medical interventions.   Emotional Assessment Appearance:  Appears stated age Attitude/Demeanor/Rapport:  Unable to Assess Affect (typically observed):  Unable to Assess Orientation:  Oriented to Self Alcohol / Substance use:  Not Applicable Psych involvement (Current and /or in the community):  No (Comment)  Discharge Needs  Concerns to be addressed:  Denies Needs/Concerns at this time Readmission within the last 30 days:  No Current discharge risk:  None Barriers to Discharge:  Continued Medical Work up   Glendon Axe A 07/19/2016, 2:41 PM

## 2016-07-19 NOTE — Progress Notes (Deleted)
HPI: FU permanent atrial fibrillation. Myoview in June of 2008 showed normal LV function with an ejection fraction of 63% and normal perfusion. Holter monitor in February of 2010 showed A. fib with controlled ventricular response. Patient also has peripheral vascular disease. ABIs in January of 2013 showed greater than 50% bilateral SFA stenosis. Echocardiogram in November 2014 showed normal LV function, mild aortic insufficiency and mitral regurgitation and mild right atrial enlargement. Patient previously had chest pain but declined further workup requesting only conservative measures. Since she was last seen,   No current facility-administered medications for this visit.    No current outpatient prescriptions on file.   Facility-Administered Medications Ordered in Other Visits  Medication Dose Route Frequency Provider Last Rate Last Dose  . 0.9 %  sodium chloride infusion   Intravenous Continuous Norval Morton, MD      . acetaminophen (TYLENOL) tablet 650 mg  650 mg Oral Once Domenic Moras, PA-C      . cyanocobalamin tablet 500 mcg  500 mcg Oral Daily Rondell A Tamala Julian, MD      . diclofenac sodium (VOLTAREN) 1 % transdermal gel 2 g  2 g Topical QID Norval Morton, MD   2 g at 07/19/16 0221  . diltiazem (CARDIZEM) tablet 60 mg  60 mg Oral BID Norval Morton, MD   60 mg at 07/18/16 2200  . folic acid (FOLVITE) tablet 1 mg  1 mg Oral Daily Rondell A Tamala Julian, MD      . gabapentin (NEURONTIN) capsule 300 mg  300 mg Oral BID Norval Morton, MD   300 mg at 07/18/16 2200  . ibuprofen (ADVIL,MOTRIN) tablet 600 mg  600 mg Oral Q6H PRN Norval Morton, MD      . insulin aspart (novoLOG) injection 2-8 Units  2-8 Units Subcutaneous BID WC Rondell Charmayne Sheer, MD      . levalbuterol (XOPENEX) nebulizer solution 0.63 mg  0.63 mg Nebulization Q4H PRN Norval Morton, MD      . levothyroxine (SYNTHROID, LEVOTHROID) tablet 50 mcg  50 mcg Oral QAC breakfast Rondell A Tamala Julian, MD      . loperamide (IMODIUM)  capsule 2 mg  2 mg Oral Daily PRN Norval Morton, MD      . mupirocin ointment (BACTROBAN) 2 % 1 application  1 application Topical Daily PRN Norval Morton, MD      . nitroGLYCERIN (NITRODUR - Dosed in mg/24 hr) patch 0.2 mg  0.2 mg Transdermal Daily Rondell A Tamala Julian, MD      . ondansetron (ZOFRAN) tablet 4 mg  4 mg Oral Q6H PRN Norval Morton, MD       Or  . ondansetron (ZOFRAN) injection 4 mg  4 mg Intravenous Q6H PRN Rondell A Tamala Julian, MD      . pantoprazole (PROTONIX) injection 40 mg  40 mg Intravenous Q12H Rondell Charmayne Sheer, MD   40 mg at 07/18/16 2200  . piperacillin-tazobactam (ZOSYN) IVPB 3.375 g  3.375 g Intravenous Q8H Angela Adam, RPH   3.375 g at 07/19/16 0057  . sodium chloride flush (NS) 0.9 % injection 10-40 mL  10-40 mL Intracatheter PRN Albertine Patricia, MD      . tiotropium [facility]) inhalation capsule 18 mcg  18 mcg Inhalation Q breakfast Norval Morton, MD   18 mcg at 07/19/16 0926  . vancomycin (VANCOCIN) IVPB 750 mg/150 ml premix  750 mg Intravenous Q24H Angela Adam, [facility]  Past Medical History:  Diagnosis Date  . Anemia   . Blood transfusion ~ 2012   "once; not related to a surgery or bleeding that I remember" (06/06/2013)  . Bronchial pneumonia    "once; several years ago" (06/06/2013)  . CHF (congestive heart failure) (HCC)    takes Lasix every other day  . Chronic atrial fibrillation (HCC)    takes Coumadin as instructed  . COPD (chronic obstructive pulmonary disease) ([place])   . Diabetes mellitus    takes Metformin and Amaryl daily  . Emphysema   . History of head injury    many yrs ago  . Hypercholesterolemia    doesn't require meds for this  . Hypertension    takes Prinizide daily  . Hypothyroidism    takes Synthroid daily  . Lung disease    BRONCHOSPASTIC  . Macular degeneration    dry  . Neuropathy (HCC)    takes Gabapentin bid  . Peripheral vascular disease ([place])   . Rheumatoid arthritis ([place])    "shoulders; daughter gives me  shots of Methotrexate q week" (06/06/2013)  . Vaginal discharge    has seen GYN but says its nothing to worry about    Past Surgical History:  Procedure Laterality Date  . AMPUTATION  11/09/2011   Procedure: AMPUTATION DIGIT;  Surgeon: Mcarthur Rossetti, MD;  Location: [facility];  Service: Orthopedics;  Laterality: Right;  Right Great Toe Amputation  . AMPUTATION  01/05/2012   Procedure: AMPUTATION RAY;  Surgeon: Mcarthur Rossetti, MD;  Location: [facility];  Service: Orthopedics;  Laterality: Left;  Amputation Left 4th and 5th Toes  . AMPUTATION  05/31/2012   Procedure: AMPUTATION RAY;  Surgeon: Newt Minion, MD;  Location: [facility];  Service: Orthopedics;  Laterality: Left;  Left foot 1st ray amp  . AMPUTATION  06/21/2012   Procedure: AMPUTATION BELOW KNEE;  Surgeon: Newt Minion, MD;  Location: [facility];  Service: Orthopedics;  Laterality: Left;  Left Below Knee Amputation  . AMPUTATION Right 06/06/2013   Procedure: RIGHT MIDFOOT AMPUTATION;  Surgeon: Newt Minion, MD;  Location: [facility];  Service: Orthopedics;  Laterality: Right;  . APPENDECTOMY    . BACK SURGERY    . CARDIOVERSION    . CATARACT EXTRACTION W/ INTRAOCULAR LENS  IMPLANT, BILATERAL Bilateral   . CERVICAL DISC SURGERY     "the top vertebrae" (06/06/2013)  . CHOLECYSTECTOMY  1951  . DILATION AND CURETTAGE OF UTERUS    . EYE SURGERY     bilateral -cataract surgery  . FOOT AMPUTATION THROUGH METATARSAL Right 06/06/2013   midfoot/notes 06/06/2013  . FRACTURE SURGERY  2011   LEFT HIP FX /REPLACEMENT   . JOINT REPLACEMENT    . TOTAL ABDOMINAL HYSTERECTOMY  1975  . TOTAL HIP ARTHROPLASTY Left ?2013    Social History   Social History  . Marital status: Widowed    Spouse name: N/A  . Number of children: N/A  . Years of education: N/A   Occupational History  . Not on file.   Social History Main Topics  . Smoking status: Former Smoker    Years: 4.00    Types: Cigarettes  . Smokeless tobacco: Never Used     Comment:  06/06/2013 "smoked a little in my teens"  . Alcohol use No  . Drug use: No  . Sexual activity: No   Other Topics Concern  . Not on file   Social History Narrative  . No narrative on file  Family History  Problem Relation Age of Onset  . Heart disease Mother   . Stroke Mother   . Heart disease Father   . Stroke Father   . Heart attack Father   . Muscular dystrophy Brother   . Anesthesia problems Neg Hx   . Hypotension Neg Hx   . Malignant hyperthermia Neg Hx   . Pseudochol deficiency Neg Hx     ROS: no fevers or chills, productive cough, hemoptysis, dysphasia, odynophagia, melena, hematochezia, dysuria, hematuria, rash, seizure activity, orthopnea, PND, pedal edema, claudication. Remaining systems are negative.  Physical Exam: Well-developed well-nourished in no acute distress.  Skin is warm and dry.  HEENT is normal.  Neck is supple.  Chest is clear to auscultation with normal expansion.  Cardiovascular exam is regular rate and rhythm.  Abdominal exam nontender or distended. No masses palpated. Extremities show no edema. neuro grossly intact  ECG

## 2016-07-19 NOTE — Progress Notes (Signed)
CRITICAL VALUE ALERT  Critical value received:  Lactic Acid 3.9  Date of notification:  07/19/16  Time of notification: 0200  Critical value read back:yes  Nurse who received alert:  Dellie Catholic  MD notified (1st page):  yes  Time of first page: 0212

## 2016-07-19 NOTE — NC FL2 (Signed)
[place] LEVEL OF CARE SCREENING TOOL     IDENTIFICATION  Patient Name: Sydney [name] Birthdate: March 28, 1921 Sex: female Admission Date (Current Location): 07/18/2016  [facility] and [place] Number:  Herbalist and Address:  [facility],  [place] [address] [place]      Provider Number: [id number]  Attending Physician Name and Address:  Albertine Patricia, MD  Relative Name and Phone Number:       Current Level of Care: Hospital Recommended Level of Care: [place] Prior Approval Number:    Date Approved/Denied:   PASRR Number:    Discharge Plan:  (ALF )    Current Diagnoses: Patient Active Problem List   Diagnosis Date Noted  . Transaminitis 07/19/2016  . Hyperbilirubinemia 07/19/2016  . Sepsis due to undetermined organism with metabolic encephalopathy ([place]) 07/18/2016  . Sepsis ([place]) 07/18/2016  . Foot ulcer, right ([place]) 02/08/2014    Class: Chronic  . PVD (peripheral vascular disease) ([place]) 02/08/2014    Class: Chronic  . Acute chest pain 02/07/2014  . HCAP (healthcare-associated pneumonia) 11/10/2013  . Pneumonia 11/10/2013  . Acute diastolic heart failure ([place]) 06/18/2013  . Acute on chronic anemia 06/09/2013  . Acute renal insufficiency 06/07/2013    Class: Acute  . Status post transmetatarsal amputation of right foot ([place]) 06/07/2013    Class: Acute  . Chest pain 04/29/2013  . Phantom limb pain ([place]) 07/24/2012  . Diabetic neuropathy ([place]) 07/24/2012  . Unilateral complete BKA-left 06/24/2012  . Osteomyelitis of toe ([place]) 01/05/2012  . Osteomyelitis of toe of left foot ([place]) 01/05/2012  . Chronic osteomyelitis of toe ([place]) 11/09/2011  . Hypothyroidism 06/11/2007  . Diabetes mellitus type 2, controlled ([place]) 06/11/2007  . HYPERLIPIDEMIA 06/11/2007  . Essential hypertension 06/11/2007  . Atrial fibrillation ([place]) 06/11/2007  . COPD 06/11/2007    Orientation RESPIRATION BLADDER Height & Weight      Self  O2 (at 2L) Continent Weight: 155 lb 13.8 oz (70.7 kg) Height:  5\' 3"  (160 cm)  BEHAVIORAL SYMPTOMS/MOOD NEUROLOGICAL BOWEL NUTRITION STATUS   (NONE )  (NONE ) Continent Diet (Heart Healthy/Carb Modified )  AMBULATORY STATUS COMMUNICATION OF NEEDS Skin   Independent Verbally Normal                       Personal Care Assistance Level of Assistance  Dressing, Feeding, Bathing Bathing Assistance: Limited assistance Feeding assistance: Limited assistance Dressing Assistance: Limited assistance     Functional Limitations Info  Speech, Hearing, Sight Sight Info: Adequate Hearing Info: Adequate Speech Info: Adequate    SPECIAL CARE FACTORS FREQUENCY                       Contractures      Additional Factors Info  Code Status, Allergies Code Status Info: FULL CODE  Allergies Info: Other, Percocet Oxycodone-acetaminophen, Tramadol, Vicodin Hydrocodone-acetaminophen           Current Medications (07/19/2016):  This is the current hospital active medication list Current Facility-Administered Medications  Medication Dose Route Frequency Provider Last Rate Last Dose  . 0.9 %  sodium chloride infusion   Intravenous Continuous Albertine Patricia, MD      . acetaminophen (TYLENOL) tablet 650 mg  650 mg Oral Once Domenic Moras, PA-C      . cyanocobalamin tablet 500 mcg  500 mcg Oral Daily Norval Morton, MD   500 mcg at 07/19/16 1353  .  diclofenac sodium (VOLTAREN) 1 % transdermal gel 2 g  2 g Topical QID Norval Morton, MD   2 g at 07/19/16 1000  . diltiazem (CARDIZEM) tablet 60 mg  60 mg Oral BID Norval Morton, MD   60 mg at 07/19/16 1353  . folic acid (FOLVITE) tablet 1 mg  1 mg Oral Daily Rondell Charmayne Sheer, MD   1 mg at 07/19/16 1353  . gabapentin (NEURONTIN) capsule 300 mg  300 mg Oral BID Norval Morton, MD   300 mg at 07/19/16 1353  . insulin aspart (novoLOG) injection 2-8 Units  2-8 Units Subcutaneous BID WC Rondell A Tamala Julian, MD      . levalbuterol  (XOPENEX) nebulizer solution 0.63 mg  0.63 mg Nebulization Q4H PRN Norval Morton, MD      . levothyroxine (SYNTHROID, LEVOTHROID) tablet 50 mcg  50 mcg Oral QAC breakfast Norval Morton, MD   50 mcg at 07/19/16 1353  . loperamide (IMODIUM) capsule 2 mg  2 mg Oral Daily PRN Norval Morton, MD      . mupirocin ointment (BACTROBAN) 2 % 1 application  1 application Topical Daily PRN Norval Morton, MD      . nitroGLYCERIN (NITRODUR - Dosed in mg/24 hr) patch 0.2 mg  0.2 mg Transdermal Daily Norval Morton, MD   0.2 mg at 07/19/16 1352  . ondansetron (ZOFRAN) tablet 4 mg  4 mg Oral Q6H PRN Norval Morton, MD       Or  . ondansetron (ZOFRAN) injection 4 mg  4 mg Intravenous Q6H PRN Rondell A Tamala Julian, MD      . pantoprazole (PROTONIX) injection 40 mg  40 mg Intravenous Q12H Norval Morton, MD   40 mg at 07/19/16 1418  . piperacillin-tazobactam (ZOSYN) IVPB 2.25 g  2.25 g Intravenous STAT Dawood S Elgergawy, MD      . piperacillin-tazobactam (ZOSYN) IVPB 2.25 g  2.25 g Intravenous Q8H Dawood S Elgergawy, MD      . sodium chloride flush (NS) 0.9 % injection 10-40 mL  10-40 mL Intracatheter PRN Albertine Patricia, MD      . tiotropium (SPIRIVA) inhalation capsule 18 mcg  18 mcg Inhalation Q breakfast Norval Morton, MD   18 mcg at 07/19/16 [id number]     Discharge Medications: Please see discharge summary for a list of discharge medications.  Relevant Imaging Results:  Relevant Lab Results:   Additional Information SSN [id number]  Glendon Axe A

## 2016-07-19 NOTE — Consult Note (Signed)
[facility] Consultation Note  Referring Provider: Dr. Phillips [facility]) Primary Care Physician:  Geoffery Lyons, MD  Reason for Consultation:  Fevers, altered mental status, elevated liver enzymes  HPI: Sydney [name] is a 82 y.o. female whom we've been asked to see for above problems.  Patient is confused and agitated and can't provided much history.  I obtained most of the history through a detailed phone conversation with patient's daughter, Sydney [name].  Patient is apparently very functional at her assisted living facility, engages in activities, sharp of mind.  This changed a couple days ago when patient apparently "didn't feel right."  Then yesterday patient had some nausea and vomiting as well as fever 103.  Patient denies abdominal pain but can provide no further history.  No reported hematemesis, prior liver problems, blood in stool.  Daughter does note that patient has longstanding history of intermittent diarrhea, but that she's never had colonoscopy before (had sigmoidoscopy apparently many many years ago).  On arrival, patient found to have elevated LFTs (mixed pattern) and bile duct about 13 mm with possible hyperenhancement of the bile duct wall.   Past Medical History:  Diagnosis Date  . Anemia   . Blood transfusion ~ 2012   "once; not related to a surgery or bleeding that I remember" (06/06/2013)  . Bronchial pneumonia    "once; several years ago" (06/06/2013)  . CHF (congestive heart failure) (HCC)    takes Lasix every other day  . Chronic atrial fibrillation (HCC)    takes Coumadin as instructed  . COPD (chronic obstructive pulmonary disease) ([place])   . Diabetes mellitus    takes Metformin and Amaryl daily  . Emphysema   . History of head injury    many yrs ago  . Hypercholesterolemia    doesn't require meds for this  . Hypertension    takes Prinizide daily  . Hypothyroidism    takes Synthroid daily  . Lung disease    BRONCHOSPASTIC  . Macular  degeneration    dry  . Neuropathy (HCC)    takes Gabapentin bid  . Peripheral vascular disease ([place])   . Rheumatoid arthritis ([place])    "shoulders; daughter gives me shots of Methotrexate q week" (06/06/2013)  . Vaginal discharge    has seen GYN but says its nothing to worry about    Past Surgical History:  Procedure Laterality Date  . AMPUTATION  11/09/2011   Procedure: AMPUTATION DIGIT;  Surgeon: Mcarthur Rossetti, MD;  Location: [facility];  Service: Orthopedics;  Laterality: Right;  Right Great Toe Amputation  . AMPUTATION  01/05/2012   Procedure: AMPUTATION RAY;  Surgeon: Mcarthur Rossetti, MD;  Location: [facility];  Service: Orthopedics;  Laterality: Left;  Amputation Left 4th and 5th Toes  . AMPUTATION  05/31/2012   Procedure: AMPUTATION RAY;  Surgeon: Newt Minion, MD;  Location: [facility];  Service: Orthopedics;  Laterality: Left;  Left foot 1st ray amp  . AMPUTATION  06/21/2012   Procedure: AMPUTATION BELOW KNEE;  Surgeon: Newt Minion, MD;  Location: [place];  Service: Orthopedics;  Laterality: Left;  Left Below Knee Amputation  . AMPUTATION Right 06/06/2013   Procedure: RIGHT MIDFOOT AMPUTATION;  Surgeon: Newt Minion, MD;  Location: [place];  Service: Orthopedics;  Laterality: Right;  . APPENDECTOMY    . BACK SURGERY    . CARDIOVERSION    . CATARACT EXTRACTION W/ INTRAOCULAR LENS  IMPLANT, BILATERAL Bilateral   . CERVICAL DISC SURGERY     "  the top vertebrae" (06/06/2013)  . CHOLECYSTECTOMY  1951  . DILATION AND CURETTAGE OF UTERUS    . EYE SURGERY     bilateral -cataract surgery  . FOOT AMPUTATION THROUGH METATARSAL Right 06/06/2013   midfoot/notes 06/06/2013  . FRACTURE SURGERY  2011   LEFT HIP FX /REPLACEMENT   . JOINT REPLACEMENT    . TOTAL ABDOMINAL HYSTERECTOMY  1975  . TOTAL HIP ARTHROPLASTY Left ?2013    Prior to Admission medications   Medication Sig Start Date End Date Taking? Authorizing Provider  acetaminophen (TYLENOL) 650 MG suppository Place 650 mg  rectally every 4 (four) hours as needed for mild pain or moderate pain.   Yes Historical Provider, MD  apixaban (ELIQUIS) 2.5 MG TABS tablet Take 2.5 mg by mouth 2 (two) times daily.   Yes Historical Provider, MD  diclofenac sodium (VOLTAREN) 1 % GEL Apply 2 g topically 4 (four) times daily.   Yes Historical Provider, MD  diltiazem (CARDIZEM) 60 MG tablet Take 60 mg by mouth 2 (two) times daily.   Yes Historical Provider, MD  folic acid (FOLVITE) 1 MG tablet Take 1 mg by mouth daily.   Yes Historical Provider, MD  furosemide (LASIX) 20 MG tablet Take 40 mg by mouth daily.   Yes Historical Provider, MD  gabapentin (NEURONTIN) 300 MG capsule Take 300 mg by mouth 2 (two) times daily.   Yes Historical Provider, MD  insulin lispro (HUMALOG) 100 UNIT/ML KiwkPen Inject 2-8 Units into the skin 2 (two) times daily. Glucose:  0-100= 0 units 101-150= 2 units 151-200- 4 units 201-250= 6 units > 250= 8 units   Yes Historical Provider, MD  levothyroxine (SYNTHROID, LEVOTHROID) 50 MCG tablet Take 50 mcg by mouth daily with breakfast.    Yes Historical Provider, MD  loperamide (IMODIUM) 2 MG capsule Take 2 mg by mouth daily as needed for diarrhea or loose stools.  04/09/15  Yes Historical Provider, MD  metFORMIN (GLUCOPHAGE) 500 MG tablet Take 500 mg by mouth daily. 07/12/15  Yes Historical Provider, MD  methotrexate 50 MG/2ML injection Inject 0.4 mLs into the muscle once a week. 06/07/15  Yes Historical Provider, MD  Multiple Vitamin (MULITIVITAMIN WITH MINERALS) TABS Take 1 tablet by mouth daily.   Yes Historical Provider, MD  Multiple Vitamins-Minerals (ICAPS PO) Take 1 capsule by mouth daily.    Yes Historical Provider, MD  mupirocin ointment (BACTROBAN) 2 % Apply 1 application topically daily as needed (wound care).  07/13/15  Yes Historical Provider, MD  nitroGLYCERIN (NITRODUR - DOSED IN MG/24 HR) 0.2 mg/hr patch Place 1 patch onto the skin daily. 06/21/15  Yes Historical Provider, MD  potassium chloride  SA (K-DUR,KLOR-CON) 20 MEQ tablet Take 20 mEq by mouth 2 (two) times daily.   Yes Historical Provider, MD  tiotropium (SPIRIVA) 18 MCG inhalation capsule Place 18 mcg into inhaler and inhale daily with breakfast.    Yes Historical Provider, MD  vitamin B-12 (CYANOCOBALAMIN) 500 MCG tablet Take 500 mcg by mouth daily.   Yes Historical Provider, MD  vitamin C (ASCORBIC ACID) 500 MG tablet Take 500 mg by mouth daily.   Yes Historical Provider, MD  pantoprazole (PROTONIX) 40 MG tablet Take 1 tablet (40 mg total) by mouth daily. Patient not taking: Reported on 07/18/2016 04/02/14   Lelon Perla, MD    Current Facility-Administered Medications  Medication Dose Route Frequency Provider Last Rate Last Dose  . 0.9 %  sodium chloride infusion   Intravenous Continuous Norval Morton, MD      .  acetaminophen (TYLENOL) tablet 650 mg  650 mg Oral Once Domenic Moras, PA-C      . cyanocobalamin tablet 500 mcg  500 mcg Oral Daily Rondell A Tamala Julian, MD      . diclofenac sodium (VOLTAREN) 1 % transdermal gel 2 g  2 g Topical QID Norval Morton, MD   2 g at 07/19/16 0221  . diltiazem (CARDIZEM) tablet 60 mg  60 mg Oral BID Norval Morton, MD   60 mg at 07/18/16 2200  . folic acid (FOLVITE) tablet 1 mg  1 mg Oral Daily Rondell A Tamala Julian, MD      . gabapentin (NEURONTIN) capsule 300 mg  300 mg Oral BID Norval Morton, MD   300 mg at 07/18/16 2200  . ibuprofen (ADVIL,MOTRIN) tablet 600 mg  600 mg Oral Q6H PRN Norval Morton, MD      . insulin aspart (novoLOG) injection 2-8 Units  2-8 Units Subcutaneous BID WC Rondell Charmayne Sheer, MD      . levalbuterol (XOPENEX) nebulizer solution 0.63 mg  0.63 mg Nebulization Q4H PRN Norval Morton, MD      . levothyroxine (SYNTHROID, LEVOTHROID) tablet 50 mcg  50 mcg Oral QAC breakfast Rondell A Tamala Julian, MD      . loperamide (IMODIUM) capsule 2 mg  2 mg Oral Daily PRN Norval Morton, MD      . mupirocin ointment (BACTROBAN) 2 % 1 application  1 application Topical Daily PRN Norval Morton, MD      . nitroGLYCERIN (NITRODUR - Dosed in mg/24 hr) patch 0.2 mg  0.2 mg Transdermal Daily Rondell A Tamala Julian, MD      . ondansetron (ZOFRAN) tablet 4 mg  4 mg Oral Q6H PRN Norval Morton, MD       Or  . ondansetron (ZOFRAN) injection 4 mg  4 mg Intravenous Q6H PRN Rondell A Tamala Julian, MD      . pantoprazole (PROTONIX) injection 40 mg  40 mg Intravenous Q12H Rondell Charmayne Sheer, MD   40 mg at 07/18/16 2200  . piperacillin-tazobactam (ZOSYN) IVPB 3.375 g  3.375 g Intravenous Q8H Angela Adam, RPH   3.375 g at 07/19/16 0057  . sodium chloride flush (NS) 0.9 % injection 10-40 mL  10-40 mL Intracatheter PRN Albertine Patricia, MD      . tiotropium [facility]) inhalation capsule 18 mcg  18 mcg Inhalation Q breakfast Rondell A Tamala Julian, MD      . vancomycin (VANCOCIN) IVPB 750 mg/150 ml premix  750 mg Intravenous Q24H Angela Adam, [facility]        Allergies as of 07/18/2016 - Review Complete 07/18/2016  Allergen Reaction Noted  . Other  01/02/2012  . Percocet [oxycodone-acetaminophen] Other (See Comments) 06/02/2012  . Tramadol Anxiety 01/18/2011  . Vicodin [hydrocodone-acetaminophen] Anxiety 01/18/2011    Family History  Problem Relation Age of Onset  . Heart disease Mother   . Stroke Mother   . Heart disease Father   . Stroke Father   . Heart attack Father   . Muscular dystrophy Brother   . Anesthesia problems Neg Hx   . Hypotension Neg Hx   . Malignant hyperthermia Neg Hx   . Pseudochol deficiency Neg Hx     Social History   Social History  . Marital status: Widowed    Spouse name: N/A  . Number of children: N/A  . Years of education: N/A   Occupational History  . Not on file.  Social History Main Topics  . Smoking status: Former Smoker    Years: 4.00    Types: Cigarettes  . Smokeless tobacco: Never Used     Comment: 06/06/2013 "smoked a little in my teens"  . Alcohol use No  . Drug use: No  . Sexual activity: No   Other Topics Concern  . Not on file   Social  History Narrative  . No narrative on file    Review of Systems: Unable to obtain due to altered mental status  Physical Exam: Vital signs in last 24 hours: Temp:  [97.7 F (36.5 C)-103.2 F (39.6 C)] 99.3 F (37.4 C) (12/06 0527) Pulse Rate:  [62-109] 109 (12/06 0527) Resp:  [13-22] 22 (12/06 0527) BP: (105-125)/(44-56) 125/44 (12/06 0527) SpO2:  [95 %-100 %] 100 % (12/06 0527) Weight:  [65.8 kg (145 lb)-70.7 kg (155 lb 13.8 oz)] 70.7 kg (155 lb 13.8 oz) (12/05 2050)   General:  Awake but disoriented, overweight, agitated Head:  Normocephalic and atraumatic. Eyes:  Sclera clear, no icterus.   Conjunctiva pink. Ears:  Normal auditory acuity. Nose:  No deformity, discharge,  or lesions. Mouth:  No deformity or lesions.  Oropharynx pink & moist. Neck:  Supple; no masses or thyromegaly. Lungs:  Clear throughout to auscultation.   No wheezes, crackles, or rhonchi. No acute distress. Heart:  Regular rate and rhythm; no murmurs, clicks, rubs,  or gallops. Abdomen:  Soft, protuberant, nontender and nondistended. No masses, hepatosplenomegaly or hernias noted. Normal bowel sounds, without guarding, and without rebound.     Msk:  Symmetrical without gross deformities. Normal posture. Pulses:  Normal pulses noted. Extremities:  Without clubbing or edema. Neurologic:  Disoriented, confused; diffusely weak, but grossly normal neurologically. Skin:  Intact without significant lesions or rashes. Psych:  Disoriented, agitated, confused, Normal mood and affect.   Lab Results:  Recent Labs  07/18/16 1602 07/19/16 0343  WBC 8.7 12.0*  HGB 11.0* 10.0*  HCT 33.6* 32.1*  PLT 141* 99*   BMET  Recent Labs  07/18/16 1602 07/19/16 0343  NA 141 141  K 4.1 4.4  CL 104 110  CO2 26 19*  GLUCOSE 169* 158*  BUN 20 22*  CREATININE 1.40* 1.79*  CALCIUM 9.3 7.6*   LFT  Recent Labs  07/19/16 0343  PROT 5.2*  ALBUMIN 3.2*  AST 666*  ALT 522*  ALKPHOS 190*  BILITOT 3.2*    PT/INR  Recent Labs  07/18/16 1602  LABPROT 17.6*  INR 1.44    Studies/Results: Ct Abdomen Pelvis W Contrast  Result Date: 07/18/2016 CLINICAL DATA:  Sepsis, transaminitis. Fever with increased lethargy. EXAM: CT ABDOMEN AND PELVIS WITH CONTRAST TECHNIQUE: Multidetector CT imaging of the abdomen and pelvis was performed using the standard protocol following bolus administration of intravenous contrast. CONTRAST:  20mL ISOVUE-300 IOPAMIDOL (ISOVUE-300) INJECTION 61% COMPARISON:  None. FINDINGS: Lower chest: Multi chamber cardiomegaly. No pleural fluid or focal airspace disease. Tiny subpleural nodularity in the periphery of the left lower lobe without suspicious characteristics. Atherosclerosis of the descending thoracic aorta. Coronary artery calcifications are seen. Hepatobiliary: Postcholecystectomy. Prominent common bile duct measuring 13 mm at the porta hepatis, mild central intrahepatic biliary ductal dilatation. No calcified choledocholithiasis. Questionable common bile duct wall enhancement, an equivocal finding. No focal hepatic lesion. Pancreas: Fatty atrophy. No ductal dilatation or surrounding inflammation. Spleen:  Normal in size and density.  Probable punctate granuloma. Adrenals/Urinary Tract: No adrenal nodule. Bilateral renal cortical thinning without hydronephrosis. Motion artifact limits assessment for perinephric edema.  No focal renal lesion. The ureters are decompressed. No excretion on delayed phase imaging consistent with renal dysfunction. Urinary bladder is decompressed and not well evaluated secondary to streak artifact from adjacent left hip prosthesis. Stomach/Bowel: The stomach is decompressed. No small bowel dilatation or inflammation. The appendix is not visualized, reportedly surgically absent. Small volume of colonic stool. There is diverticulosis involving the descending and sigmoid colon. Minimal soft tissue stranding in the deep right pelvis is adjacent to the  sigmoid colon. Stranding from left hip prosthesis partially obscures detailed evaluation. No extraluminal air or abscess. Vascular/Lymphatic: Advanced atherosclerosis of the abdominal aorta and its branches. Calcified plaque leads to luminal narrowing of the distal abdominal aorta just proximal to the iliac bifurcation. Atherosclerotic calcifications of the intra-abdominal vasculature. No definite intra-abdominal or pelvic adenopathy. Small retroperitoneal lymph nodes are not enlarged by size criteria. Reproductive: The uterus is surgically absent. Neither ovary is confidently identified. Other: No ascites or free air. Tiny fat containing umbilical hernia. Musculoskeletal: Mild T12 superior endplate compression fracture is chronic based on chest radiograph 06/18/2013. The bones are under mineralized. Multilevel degenerative change throughout the lumbar spine. Left hip prosthesis in place. No acute osseous abnormality is seen. IMPRESSION: 1. Mild biliary prominence postcholecystectomy. This is a common postsurgical finding. There is equivocal biliary enhancement, which may be normal for this patient or represent ascending cholangitis. No calcified choledocholithiasis. 2. Distal descending and sigmoid colonic diverticulosis. Mild stranding in the deep right pelvis may be secondary to mild uncomplicated acute diverticulitis, however postsurgical scarring given prior hysterectomy could have a similar appearance. No abscess or perforation. 3. Advanced atherosclerosis. 4. Thinning of the renal parenchyma and absence of renal excretion on delayed phase imaging is consistent with chronic renal disease. 5. Osteopenia/osteoporosis.  Chronic T12 compression fracture. Electronically Signed   By: Jeb Levering M.D.   On: 07/18/2016 18:39   Dg Chest Port 1 View  Result Date: 07/18/2016 CLINICAL DATA:  FF sepsis, fever EXAM: PORTABLE CHEST 1 VIEW COMPARISON:  02/07/2014 FINDINGS: Cardiomegaly. No confluent airspace  opacities or effusions. No acute bony abnormality. IMPRESSION: Cardiomegaly.  No acute findings. Electronically Signed   By: Rolm Baptise M.D.   On: 07/18/2016 16:59   Impression:  1.  Fevers with suspected sepsis syndrome. 2.  Elevated LFTs. 3.  Altered mental status. 4.  Post-cholecystectomy. 5.  Multiple medical problems. 6.  Chronic anticoagulation (Eliquis).  Plan:  1.  I had a detailed conversation with patient's daughter, Sydney [name], over the telephone about her mother's care. 2.  I have discussed that patient may have bile duct stone and may have cholangitis, but no definitive evidence of this has been identified.  Given patient's age and comorbidities, and apixaban therapy, I do not think ERCP right now is a good idea.  It would be quite risky, and both patient's daughter and I feel the risks of ERCP with general anesthesia outweigh the risks of upfront antibiotics and aggressive medical therapy. 3.  Continue antibiotics, broad-spectrum with anaerobic and gram-negative coverage. 4.  Await blood culture. 5.  Would hold Eliquis, in case we end up ultimately needing to do ERCP. 6.  Will discuss with radiology to see whether patient is candidate for MRCP, which I would ideally like to pursue test of choice to assess for choledocholithiasis before committing patient to have ERCP. 7.  Overall, regardless of management strategy, patient's prognosis is guarded. 8.  [facility] will follow.   LOS: 1 day   Tymara Saur M  07/19/2016, 9:18  AM  Pager [phone] If no answer or after 5 PM call [phone]

## 2016-07-19 NOTE — Progress Notes (Signed)
PROGRESS NOTE                                                                                                                                                                                                             Patient Demographics:    Sydney [name], is a 82 y.o. female, DOB - 03/05/1921, [id number]  Admit date - 07/18/2016   Admitting Physician Norval Morton, MD  Outpatient Primary MD for the patient is Geoffery Lyons, MD  LOS - 1   Chief Complaint  Patient presents with  . Code Sepsis       Brief Narrative   82 y.o. female with medical history significant of  Afib on Eliquis, HTN, HLD, DM type II, Hyopothyroidism, left BKA, COPD, anemia; who presented with reports of confusion and fever, workup significant for elevated LFTs, with imaging evidence of possible cholangitis.   Subjective:    Sydney [name] today Is confused, but denies any complaints, no abdominal pain,   Assessment  & Plan :    Principal Problem:   Sepsis due to undetermined organism with metabolic encephalopathy ([place]) Active Problems:   Hypothyroidism   Diabetes mellitus type 2, controlled ([place])   Essential hypertension   Atrial fibrillation (HCC)   Transaminitis   Hyperbilirubinemia  Sepsis secondary to choledocholithiasis versus cholangitis/ GNR bacteremia - GI input greatly appreciated, currently we'll continue with aggressive medical management with hydration, and IV antibiotics, will hold an ERCP as per GI discussion with family. - Continue with IV Zosyn, will stop IV vancomycin - Continue with IV fluids, monitor CMP closely - Hold Eliquis for possible need for ERCP  Related LFTs  - see above discussion  Chronic atrial fibrillation:  - Currently rate controlled on diltiazem  - Hold  Eliquis for possible need of procedure  Thrombocytopenia - Most likely related to sepsis  Chronic kidney disease stage III:  - Patient's previous baseline noted to  be around 1.28 over 2 years ago. She presents tonight with BUN at the upper limit of normal and creatinine at 1.40. - Follow-up kidney function in a.m.   Diabetes mellitus type 2: - Patient on metformin and sliding scale insulin at home.  - Hypoglycemic protocol - Hold metformin  - Continue home insulin sliding scale regimen with CBGs twice daily - Continue gabapentin  Essential hypertension - will need  to restart Lasix and potassium chloride when medically appropriate  COPD  - Continuous pulse oximetry overnight with nasal cannula oxygen to keep O2 sats greater than 92% - Continue Spiriva - Xopenex neb prn SOB/Wheezing  Anemia:  - Chronic. Hemoglobin 11 which appears near patient's baseline. - Continue to monitor  History of CHF:  - last echo back in 06/2015 - Strict I&O's   - Monitoring overnight for signs of fluid overload and will give IV Lasix if needed  Hypothyroidism - Continue levothyroxine  GERD - protonix    Code Status : Full  Family Communication  : None at bedside  Disposition Plan  : pending further work up.  Consults  :  GI  Procedures  : None  DVT Prophylaxis  :  On Eliquis (on Hold)  Lab Results  Component Value Date   PLT 99 (L) 07/19/2016    Antibiotics  :    Anti-infectives    Start     Dose/Rate Route Frequency Ordered Stop   07/19/16 2200  piperacillin-tazobactam (ZOSYN) IVPB 2.25 g     2.25 g 100 mL/hr over 30 Minutes Intravenous Every 8 hours 07/19/16 1351     07/19/16 1600  vancomycin (VANCOCIN) IVPB 750 mg/150 ml premix  Status:  Discontinued     750 mg 150 mL/hr over 60 Minutes Intravenous Every 24 hours 07/18/16 2034 07/19/16 1356   07/19/16 1400  piperacillin-tazobactam (ZOSYN) IVPB 2.25 g     2.25 g 100 mL/hr over 30 Minutes Intravenous STAT 07/19/16 1351 07/20/16 1400   07/19/16 0000  piperacillin-tazobactam (ZOSYN) IVPB 3.375 g  Status:  Discontinued     3.375 g 12.5 mL/hr over 240 Minutes Intravenous Every 8  hours 07/18/16 2034 07/19/16 1351   07/18/16 1615  piperacillin-tazobactam (ZOSYN) IVPB 3.375 g     3.375 g 100 mL/hr over 30 Minutes Intravenous  Once 07/18/16 1600 07/18/16 1701   07/18/16 1615  vancomycin (VANCOCIN) IVPB 1000 mg/200 mL premix     1,000 mg 200 mL/hr over 60 Minutes Intravenous  Once 07/18/16 1600 07/18/16 1900        Objective:   Vitals:   07/18/16 2050 07/19/16 0214 07/19/16 0527 07/19/16 0926  BP: (!) 113/48 (!) 105/53 (!) 125/44   Pulse: 76 62 (!) 109   Resp: 13 20 (!) 22   Temp: 97.7 F (36.5 C) 98.1 F (36.7 C) 99.3 F (37.4 C)   TempSrc: Oral Axillary Oral   SpO2: 95% 100% 100% 96%  Weight: 70.7 kg (155 lb 13.8 oz)     Height: 5\' 3"  (1.6 m)       Wt Readings from Last 3 Encounters:  07/18/16 70.7 kg (155 lb 13.8 oz)  07/16/15 67.1 kg (148 lb)  01/01/15 62.1 kg (137 lb)     Intake/Output Summary (Last 24 hours) at 07/19/16 1403 Last data filed at 07/18/16 1928  Gross per 24 hour  Intake             1250 ml  Output                0 ml  Net             1250 ml     Physical Exam  Awake Alert,Confused, frail Supple Neck,No JVD Symmetrical Chest wall movement, Good air movement bilaterally, CTAB RRR,No Gallops,Rubs or new Murmurs, No Parasternal Heave +ve B.Sounds, Abd Soft, No tenderness, No rebound - guarding or rigidity. No Cyanosis, left BKA, right transtibial amputation  Data Review:    CBC  Recent Labs Lab 07/18/16 1602 07/19/16 0343  WBC 8.7 12.0*  HGB 11.0* 10.0*  HCT 33.6* 32.1*  PLT 141* 99*  MCV 97.1 102.9*  MCH 31.8 32.1  MCHC 32.7 31.2  RDW 15.6* 16.2*  LYMPHSABS 0.2*  --   MONOABS 0.6  --   EOSABS 0.0  --   BASOSABS 0.0  --     Chemistries   Recent Labs Lab 07/18/16 1602 07/19/16 0343  NA 141 141  K 4.1 4.4  CL 104 110  CO2 26 19*  GLUCOSE 169* 158*  BUN 20 22*  CREATININE 1.40* 1.79*  CALCIUM 9.3 7.6*  AST 1,047* 666*  ALT 493* 522*  ALKPHOS 296* 190*  BILITOT 2.7* 3.2*    ------------------------------------------------------------------------------------------------------------------ No results for input(s): CHOL, HDL, LDLCALC, TRIG, CHOLHDL, LDLDIRECT in the last 72 hours.  Lab Results  Component Value Date   HGBA1C 7.4 (H) 11/10/2013   ------------------------------------------------------------------------------------------------------------------  Recent Labs  07/18/16 2222  TSH 1.457   ------------------------------------------------------------------------------------------------------------------ No results for input(s): VITAMINB12, FOLATE, FERRITIN, TIBC, IRON, RETICCTPCT in the last 72 hours.  Coagulation profile  Recent Labs Lab 07/18/16 1602  INR 1.44    No results for input(s): DDIMER in the last 72 hours.  Cardiac Enzymes  Recent Labs Lab 07/18/16 2222 07/19/16 0351  TROPONINI 0.03* <0.03   ------------------------------------------------------------------------------------------------------------------ No results found for: BNP  Inpatient Medications  Scheduled Meds: . acetaminophen  650 mg Oral Once  . cyanocobalamin  500 mcg Oral Daily  . diclofenac sodium  2 g Topical QID  . diltiazem  60 mg Oral BID  . folic acid  1 mg Oral Daily  . gabapentin  300 mg Oral BID  . insulin aspart  2-8 Units Subcutaneous BID WC  . levothyroxine  50 mcg Oral QAC breakfast  . nitroGLYCERIN  0.2 mg Transdermal Daily  . pantoprazole (PROTONIX) IV  40 mg Intravenous Q12H  . piperacillin-tazobactam (ZOSYN)  IV  2.25 g Intravenous STAT  . piperacillin-tazobactam (ZOSYN)  IV  2.25 g Intravenous Q8H  . tiotropium  18 mcg Inhalation Q breakfast   Continuous Infusions: . sodium chloride     PRN Meds:.levalbuterol, loperamide, mupirocin ointment, ondansetron **OR** ondansetron (ZOFRAN) IV, sodium chloride flush  Micro Results Recent Results (from the past 240 hour(s))  Blood Culture (routine x 2)     Status: None (Preliminary  result)   Collection Time: 07/18/16  3:59 PM  Result Value Ref Range Status   Specimen Description BLOOD LEFT FOREARM  Final   Special Requests BOTTLES DRAWN AEROBIC AND ANAEROBIC 5CC  Final   Culture  Setup Time   Final    GRAM NEGATIVE RODS IN BOTH AEROBIC AND ANAEROBIC BOTTLES CRITICAL RESULT CALLED TO, READ BACK BY AND VERIFIED WITH: C. SUMME Glenshaw, AT [id number] 07/19/16 BY D. VANHOOK Performed at [place]  Final   Report Status PENDING  Incomplete  Blood Culture ID Panel (Reflexed)     Status: Abnormal   Collection Time: 07/18/16  3:59 PM  Result Value Ref Range Status   Enterococcus species NOT DETECTED NOT DETECTED Final   Listeria monocytogenes NOT DETECTED NOT DETECTED Final   Staphylococcus species NOT DETECTED NOT DETECTED Final   Staphylococcus aureus NOT DETECTED NOT DETECTED Final   Streptococcus species NOT DETECTED NOT DETECTED Final   Streptococcus agalactiae NOT DETECTED NOT DETECTED Final   Streptococcus pneumoniae NOT DETECTED NOT DETECTED Final  Streptococcus pyogenes NOT DETECTED NOT DETECTED Final   Acinetobacter baumannii NOT DETECTED NOT DETECTED Final   Enterobacteriaceae species DETECTED (A) NOT DETECTED Final    Comment: CRITICAL RESULT CALLED TO, READ BACK BY AND VERIFIED WITH: C. SUMME PHARMD, AT [id number] 07/19/16 BY D. VANHOOK    Enterobacter cloacae complex NOT DETECTED NOT DETECTED Final   Escherichia coli DETECTED (A) NOT DETECTED Final    Comment: CRITICAL RESULT CALLED TO, READ BACK BY AND VERIFIED WITH: C. SUMME PHARMD, AT [id number] 07/19/16 BY D. VANHOOK    Klebsiella oxytoca NOT DETECTED NOT DETECTED Final   Klebsiella pneumoniae NOT DETECTED NOT DETECTED Final   Proteus species NOT DETECTED NOT DETECTED Final   Serratia marcescens NOT DETECTED NOT DETECTED Final   Carbapenem resistance NOT DETECTED NOT DETECTED Final   Haemophilus influenzae NOT DETECTED NOT DETECTED Final   Neisseria meningitidis NOT DETECTED  NOT DETECTED Final   Pseudomonas aeruginosa NOT DETECTED NOT DETECTED Final   Candida albicans NOT DETECTED NOT DETECTED Final   Candida glabrata NOT DETECTED NOT DETECTED Final   Candida krusei NOT DETECTED NOT DETECTED Final   Candida parapsilosis NOT DETECTED NOT DETECTED Final   Candida tropicalis NOT DETECTED NOT DETECTED Final    Comment: Performed at [facility]  Blood Culture (routine x 2)     Status: None (Preliminary result)   Collection Time: 07/18/16  4:05 PM  Result Value Ref Range Status   Specimen Description RIGHT ANTECUBITAL  Final   Special Requests BOTTLES DRAWN AEROBIC AND ANAEROBIC 5CC  Final   Culture  Setup Time   Final    GRAM NEGATIVE RODS IN BOTH AEROBIC AND ANAEROBIC BOTTLES CRITICAL RESULT CALLED TO, READ BACK BY AND VERIFIED WITHChristella Hartigan [place], AT [id number] 07/19/16 BY Rush Landmark Performed at [place]  Final   Report Status PENDING  Incomplete    Radiology Reports Ct Abdomen Pelvis W Contrast  Result Date: 07/18/2016 CLINICAL DATA:  Sepsis, transaminitis. Fever with increased lethargy. EXAM: CT ABDOMEN AND PELVIS WITH CONTRAST TECHNIQUE: Multidetector CT imaging of the abdomen and pelvis was performed using the standard protocol following bolus administration of intravenous contrast. CONTRAST:  63mL ISOVUE-300 IOPAMIDOL (ISOVUE-300) INJECTION 61% COMPARISON:  None. FINDINGS: Lower chest: Multi chamber cardiomegaly. No pleural fluid or focal airspace disease. Tiny subpleural nodularity in the periphery of the left lower lobe without suspicious characteristics. Atherosclerosis of the descending thoracic aorta. Coronary artery calcifications are seen. Hepatobiliary: Postcholecystectomy. Prominent common bile duct measuring 13 mm at the porta hepatis, mild central intrahepatic biliary ductal dilatation. No calcified choledocholithiasis. Questionable common bile duct wall enhancement, an equivocal finding. No focal  hepatic lesion. Pancreas: Fatty atrophy. No ductal dilatation or surrounding inflammation. Spleen:  Normal in size and density.  Probable punctate granuloma. Adrenals/Urinary Tract: No adrenal nodule. Bilateral renal cortical thinning without hydronephrosis. Motion artifact limits assessment for perinephric edema. No focal renal lesion. The ureters are decompressed. No excretion on delayed phase imaging consistent with renal dysfunction. Urinary bladder is decompressed and not well evaluated secondary to streak artifact from adjacent left hip prosthesis. Stomach/Bowel: The stomach is decompressed. No small bowel dilatation or inflammation. The appendix is not visualized, reportedly surgically absent. Small volume of colonic stool. There is diverticulosis involving the descending and sigmoid colon. Minimal soft tissue stranding in the deep right pelvis is adjacent to the sigmoid colon. Stranding from left hip prosthesis partially obscures detailed evaluation. No extraluminal air or abscess. Vascular/Lymphatic:  Advanced atherosclerosis of the abdominal aorta and its branches. Calcified plaque leads to luminal narrowing of the distal abdominal aorta just proximal to the iliac bifurcation. Atherosclerotic calcifications of the intra-abdominal vasculature. No definite intra-abdominal or pelvic adenopathy. Small retroperitoneal lymph nodes are not enlarged by size criteria. Reproductive: The uterus is surgically absent. Neither ovary is confidently identified. Other: No ascites or free air. Tiny fat containing umbilical hernia. Musculoskeletal: Mild T12 superior endplate compression fracture is chronic based on chest radiograph 06/18/2013. The bones are under mineralized. Multilevel degenerative change throughout the lumbar spine. Left hip prosthesis in place. No acute osseous abnormality is seen. IMPRESSION: 1. Mild biliary prominence postcholecystectomy. This is a common postsurgical finding. There is equivocal biliary  enhancement, which may be normal for this patient or represent ascending cholangitis. No calcified choledocholithiasis. 2. Distal descending and sigmoid colonic diverticulosis. Mild stranding in the deep right pelvis may be secondary to mild uncomplicated acute diverticulitis, however postsurgical scarring given prior hysterectomy could have a similar appearance. No abscess or perforation. 3. Advanced atherosclerosis. 4. Thinning of the renal parenchyma and absence of renal excretion on delayed phase imaging is consistent with chronic renal disease. 5. Osteopenia/osteoporosis.  Chronic T12 compression fracture. Electronically Signed   By: Jeb Levering M.D.   On: 07/18/2016 18:39   Dg Chest Port 1 View  Result Date: 07/19/2016 CLINICAL DATA:  Line placement. EXAM: PORTABLE CHEST 1 VIEW COMPARISON:  07/18/2016 . FINDINGS: Right PICC line noted with tip projected over the superior vena cava. Cardiomegaly with normal pulmonary vascularity. Mild right upper lobe subsegmental atelectasis. No pleural effusion or pneumothorax. IMPRESSION: 1.  Right PICC line noted with tip projected superior vena cava. 2. Cardiomegaly.  No pulmonary venous congestion. 3. Mild right upper lobe subsegmental atelectasis. Electronically Signed   By: Marcello Moores  Register   On: 07/19/2016 09:39   Dg Chest Port 1 View  Result Date: 07/18/2016 CLINICAL DATA:  FF sepsis, fever EXAM: PORTABLE CHEST 1 VIEW COMPARISON:  02/07/2014 FINDINGS: Cardiomegaly. No confluent airspace opacities or effusions. No acute bony abnormality. IMPRESSION: Cardiomegaly.  No acute findings. Electronically Signed   By: Rolm Baptise M.D.   On: 07/18/2016 16:59     ELGERGAWY, DAWOOD M.D on 07/19/2016 at 2:03 PM  Between 7am to 7pm - Pager - [phone]  After 7pm go to [email or web address] - password [facility]  [facility] -  Office  [phone]

## 2016-07-19 NOTE — Progress Notes (Signed)
Pharmacy Antibiotic Note  Sydney [name] is a 82 y.o. female admitted on 07/18/2016 with sepsis. Comes from nursing home with fever, n/v, and increasing lethargy.  Hx of diabetes, Afib, HF and COPD. Imaging studies revealed possibility of ascending cholangitis as well as acute diverticulitis. Pharmacy initially consulted for vancomycin and zosyn dosing. Blood cultures now positive for E.coli, and Vancomycin has subsequently been discontinued.   Plan: Adjust Zosyn to 2.25g IV q8h for CrCl < 20 ml/min (SCr up to 1.79 today). Follow renal function, cultures, clinical course  Height: 5\' 3"  (160 cm) Weight: 155 lb 13.8 oz (70.7 kg) IBW/kg (Calculated) : 52.4  Temp (24hrs), Avg:99.9 F (37.7 C), Min:97.7 F (36.5 C), Max:103.2 F (39.6 C)   Recent Labs Lab 07/18/16 1602 07/18/16 1627 07/18/16 2222 07/19/16 0105 07/19/16 0343  WBC 8.7  --   --   --  12.0*  CREATININE 1.40*  --   --   --  1.79*  LATICACIDVEN  --  3.19* 3.2* 3.9* 3.7*    Estimated Creatinine Clearance: 18.5 mL/min (by C-G formula based on SCr of 1.79 mg/dL (H)).    Allergies  Allergen Reactions  . Other     Narcotics=makes her very disoriented  . Percocet [Oxycodone-Acetaminophen] Other (See Comments)    Dilerium, hallucinations  . Tramadol Anxiety  . Vicodin [Hydrocodone-Acetaminophen] Anxiety    Antimicrobials this admission: 12/5 vanc >> 12/6 12/5 zosyn >>   Microbiology results: 12/5 BCx: 2/2 with GNR (BCID = E.coli, no resistance patterns detected) 12/5 UCx: sent 12/6 MRSA PCR: sent  Thank you for allowing pharmacy to be a part of this patient's care.    Lindell Spar, PharmD, BCPS Pager: [phone] 07/19/2016 2:03 PM

## 2016-07-19 NOTE — Progress Notes (Signed)
The patient pulled out both PIVs. IV team came to assess patient for PIV site. IV team nurse was unsuccessful. IV team nurse suggested placement of a PICC. PCP was notified.

## 2016-07-19 NOTE — Clinical Social Work Note (Signed)
MSW attempted to contact patient's dtr, [name] in reference to discharge planning and confirming ALF.   MSW remains available as needed.   Glendon Axe, MSW [phone] 07/19/2016 2:32 PM

## 2016-07-19 NOTE — Progress Notes (Signed)
PHARMACY - PHYSICIAN COMMUNICATION CRITICAL VALUE ALERT - BLOOD CULTURE IDENTIFICATION (BCID)  Results for orders placed or performed during the hospital encounter of 07/18/16  Blood Culture ID Panel (Reflexed) (Collected: 07/18/2016  3:59 PM)  Result Value Ref Range   Enterococcus species NOT DETECTED NOT DETECTED   Listeria monocytogenes NOT DETECTED NOT DETECTED   Staphylococcus species NOT DETECTED NOT DETECTED   Staphylococcus aureus NOT DETECTED NOT DETECTED   Streptococcus species NOT DETECTED NOT DETECTED   Streptococcus agalactiae NOT DETECTED NOT DETECTED   Streptococcus pneumoniae NOT DETECTED NOT DETECTED   Streptococcus pyogenes NOT DETECTED NOT DETECTED   Acinetobacter baumannii NOT DETECTED NOT DETECTED   Enterobacteriaceae species DETECTED (A) NOT DETECTED   Enterobacter cloacae complex NOT DETECTED NOT DETECTED   Escherichia coli DETECTED (A) NOT DETECTED   Klebsiella oxytoca NOT DETECTED NOT DETECTED   Klebsiella pneumoniae NOT DETECTED NOT DETECTED   Proteus species NOT DETECTED NOT DETECTED   Serratia marcescens NOT DETECTED NOT DETECTED   Carbapenem resistance NOT DETECTED NOT DETECTED   Haemophilus influenzae NOT DETECTED NOT DETECTED   Neisseria meningitidis NOT DETECTED NOT DETECTED   Pseudomonas aeruginosa NOT DETECTED NOT DETECTED   Candida albicans NOT DETECTED NOT DETECTED   Candida glabrata NOT DETECTED NOT DETECTED   Candida krusei NOT DETECTED NOT DETECTED   Candida parapsilosis NOT DETECTED NOT DETECTED   Candida tropicalis NOT DETECTED NOT DETECTED    Name of physician (or Provider) Contacted: Dr. Waldron [facility]  Changes to prescribed antibiotics required:  D/C Vancomycin, continue broad spectrum Zosyn at this time.  Sydney [name] 07/19/2016  1:51 PM

## 2016-07-19 NOTE — Progress Notes (Signed)
Pt continues to pull at lines and throws arms against side rails.  Pt does not follow commands.  TeleSitter initiated. Eulas Post

## 2016-07-20 DIAGNOSIS — I10 Essential (primary) hypertension: Secondary | ICD-10-CM

## 2016-07-20 DIAGNOSIS — A4151 Sepsis due to Escherichia coli [E. coli]: Principal | ICD-10-CM

## 2016-07-20 LAB — URINE CULTURE: CULTURE: NO GROWTH

## 2016-07-20 LAB — COMPREHENSIVE METABOLIC PANEL
ALK PHOS: 152 U/L — AB (ref 38–126)
ALT: 300 U/L — ABNORMAL HIGH (ref 14–54)
ANION GAP: 9 (ref 5–15)
AST: 277 U/L — ABNORMAL HIGH (ref 15–41)
Albumin: 3.4 g/dL — ABNORMAL LOW (ref 3.5–5.0)
BILIRUBIN TOTAL: 3.4 mg/dL — AB (ref 0.3–1.2)
BUN: 29 mg/dL — ABNORMAL HIGH (ref 6–20)
CALCIUM: 7.6 mg/dL — AB (ref 8.9–10.3)
CO2: 22 mmol/L (ref 22–32)
Chloride: 109 mmol/L (ref 101–111)
Creatinine, Ser: 1.61 mg/dL — ABNORMAL HIGH (ref 0.44–1.00)
GFR calc Af Amer: 31 mL/min — ABNORMAL LOW (ref >60)
GFR, EST NON AFRICAN AMERICAN: 26 mL/min — AB (ref >60)
Glucose, Bld: 98 mg/dL (ref 65–99)
POTASSIUM: 3.6 mmol/L (ref 3.5–5.1)
Sodium: 140 mmol/L (ref 135–145)
TOTAL PROTEIN: 5.5 g/dL — AB (ref 6.5–8.1)

## 2016-07-20 LAB — CBC
HEMATOCRIT: 28.2 % — AB (ref 36.0–46.0)
HEMOGLOBIN: 9.3 g/dL — AB (ref 12.0–15.0)
MCH: 32.2 pg (ref 26.0–34.0)
MCHC: 33 g/dL (ref 30.0–36.0)
MCV: 97.6 fL (ref 78.0–100.0)
Platelets: 113 10*3/uL — ABNORMAL LOW (ref 150–400)
RBC: 2.89 MIL/uL — ABNORMAL LOW (ref 3.87–5.11)
RDW: 15.9 % — AB (ref 11.5–15.5)
WBC: 8.6 10*3/uL (ref 4.0–10.5)

## 2016-07-20 LAB — LACTIC ACID, PLASMA: LACTIC ACID, VENOUS: 1 mmol/L (ref 0.5–1.9)

## 2016-07-20 LAB — GLUCOSE, CAPILLARY
Glucose-Capillary: 110 mg/dL — ABNORMAL HIGH (ref 65–99)
Glucose-Capillary: 136 mg/dL — ABNORMAL HIGH (ref 65–99)

## 2016-07-20 NOTE — Progress Notes (Addendum)
PROGRESS NOTE                                                                                                                                                                                                             Patient Demographics:    Sydney [name], is a 82 y.o. female, DOB - Nov 09, 1920, [id number]  Admit date - 07/18/2016   Admitting Physician Norval Morton, MD  Outpatient Primary MD for the patient is Geoffery Lyons, MD  LOS - 2   Chief Complaint  Patient presents with  . Code Sepsis       Brief Narrative   82 y.o. female with medical history significant of  Afib on Eliquis, HTN, HLD, DM type II, Hyopothyroidism, left BKA, COPD, anemia; who presented with reports of confusion and fever, workup significant for elevated LFTs, with imaging evidence of possible cholangitis.   Subjective:    Sydney [name] today Is confused, but denies any complaints, no abdominal pain,   Assessment  & Plan :    Principal Problem:   Sepsis due to undetermined organism with metabolic encephalopathy ([place]) Active Problems:   Hypothyroidism   Diabetes mellitus type 2, controlled ([place])   Essential hypertension   Atrial fibrillation (HCC)   Transaminitis   Hyperbilirubinemia  Sepsis secondary to choledocholithiasis versus cholangitis/ E coli bacteremia - GI input greatly appreciated, Continue with IV fluids , IV antibiotics, plan for MRCP today, and next step depends on MRCP finding. - Continue with IV Zosyn, - Continue with IV fluids, monitor CMP closely - Hold Eliquis for possible need for ERCP  Related LFTs  - see above discussion  Chronic atrial fibrillation:  - Currently rate controlled on diltiazem  - Hold  Eliquis for possible need of procedure  Thrombocytopenia - Most likely related to sepsis  AKI on Chronic kidney disease stage III:  - Patient's previous baseline noted to be around 1.28 over 2 years ago. Creatinine peaked at 1.79,  improving with IV fluids, avoid nephrotoxic medication - Follow-up kidney function in a.m.   Diabetes mellitus type 2: - Patient on metformin and sliding scale insulin at home.  - Hypoglycemic protocol - Hold metformin  - Continue home insulin sliding scale regimen with CBGs twice daily - Continue gabapentin  Essential hypertension - will need to restart Lasix and potassium chloride when medically appropriate  COPD  - Continuous pulse oximetry overnight with nasal cannula oxygen to keep O2 sats greater than 92% - Continue Spiriva - Xopenex neb prn SOB/Wheezing  Anemia:  - Chronic. Hemoglobin 11 which appears near patient's baseline. - Continue to monitor  History of CHF:  - last echo back in 06/2015 - Strict I&O's   - Monitoring overnight for signs of fluid overload and will give IV Lasix if needed  Hypothyroidism - Continue levothyroxine  GERD - protonix    Code Status : Full  Family Communication  : D/W daughter via phone 12/6  Disposition Plan  : pending further work up.  Consults  :  GI  Procedures  : None  DVT Prophylaxis  :  On Eliquis (on Hold)  Lab Results  Component Value Date   PLT 113 (L) 07/20/2016    Antibiotics  :    Anti-infectives    Start     Dose/Rate Route Frequency Ordered Stop   07/19/16 2200  piperacillin-tazobactam (ZOSYN) IVPB 2.25 g     2.25 g 100 mL/hr over 30 Minutes Intravenous Every 8 hours 07/19/16 1351     07/19/16 1600  vancomycin (VANCOCIN) IVPB 750 mg/150 ml premix  Status:  Discontinued     750 mg 150 mL/hr over 60 Minutes Intravenous Every 24 hours 07/18/16 2034 07/19/16 1356   07/19/16 1400  piperacillin-tazobactam (ZOSYN) IVPB 2.25 g     2.25 g 100 mL/hr over 30 Minutes Intravenous STAT 07/19/16 1351 07/19/16 1527   07/19/16 0000  piperacillin-tazobactam (ZOSYN) IVPB 3.375 g  Status:  Discontinued     3.375 g 12.5 mL/hr over 240 Minutes Intravenous Every 8 hours 07/18/16 2034 07/19/16 1351   07/18/16 1615   piperacillin-tazobactam (ZOSYN) IVPB 3.375 g     3.375 g 100 mL/hr over 30 Minutes Intravenous  Once 07/18/16 1600 07/18/16 1701   07/18/16 1615  vancomycin (VANCOCIN) IVPB 1000 mg/200 mL premix     1,000 mg 200 mL/hr over 60 Minutes Intravenous  Once 07/18/16 1600 07/18/16 1900        Objective:   Vitals:   07/19/16 2230 07/20/16 0000 07/20/16 0510 07/20/16 0839  BP: (!) 152/52  (!) 133/56   Pulse: 97  86   Resp: 20  20   Temp: (!) 100.5 F (38.1 C) 98.9 F (37.2 C) 100.3 F (37.9 C)   TempSrc: Oral Oral Oral   SpO2: 99%  98% 97%  Weight:      Height:        Wt Readings from Last 3 Encounters:  07/18/16 70.7 kg (155 lb 13.8 oz)  07/16/15 67.1 kg (148 lb)  01/01/15 62.1 kg (137 lb)     Intake/Output Summary (Last 24 hours) at 07/20/16 1422 Last data filed at 07/20/16 0900  Gross per 24 hour  Intake           1717.5 ml  Output                0 ml  Net           1717.5 ml     Physical Exam  Awake Alert,Confused, frail Supple Neck,No JVD Symmetrical Chest wall movement, Good air movement bilaterally, CTAB RRR,No Gallops,Rubs or new Murmurs, No Parasternal Heave +ve B.Sounds, Abd Soft, No tenderness, No rebound - guarding or rigidity. No Cyanosis, left BKA, right transtibial amputation    Data Review:    CBC  Recent Labs Lab 07/18/16 1602 07/19/16 0343 07/20/16 0354  WBC 8.7 12.0*  8.6  HGB 11.0* 10.0* 9.3*  HCT 33.6* 32.1* 28.2*  PLT 141* 99* 113*  MCV 97.1 102.9* 97.6  MCH 31.8 32.1 32.2  MCHC 32.7 31.2 33.0  RDW 15.6* 16.2* 15.9*  LYMPHSABS 0.2*  --   --   MONOABS 0.6  --   --   EOSABS 0.0  --   --   BASOSABS 0.0  --   --     Chemistries   Recent Labs Lab 07/18/16 1602 07/19/16 0343 07/20/16 0354  NA 141 141 140  K 4.1 4.4 3.6  CL 104 110 109  CO2 26 19* 22  GLUCOSE 169* 158* 98  BUN 20 22* 29*  CREATININE 1.40* 1.79* 1.61*  CALCIUM 9.3 7.6* 7.6*  AST 1,047* 666* 277*  ALT 493* 522* 300*  ALKPHOS 296* 190* 152*  BILITOT 2.7*  3.2* 3.4*   ------------------------------------------------------------------------------------------------------------------ No results for input(s): CHOL, HDL, LDLCALC, TRIG, CHOLHDL, LDLDIRECT in the last 72 hours.  Lab Results  Component Value Date   HGBA1C 7.4 (H) 11/10/2013   ------------------------------------------------------------------------------------------------------------------  Recent Labs  07/18/16 2222  TSH 1.457   ------------------------------------------------------------------------------------------------------------------ No results for input(s): VITAMINB12, FOLATE, FERRITIN, TIBC, IRON, RETICCTPCT in the last 72 hours.  Coagulation profile  Recent Labs Lab 07/18/16 1602  INR 1.44    No results for input(s): DDIMER in the last 72 hours.  Cardiac Enzymes  Recent Labs Lab 07/18/16 2222 07/19/16 0351  TROPONINI 0.03* <0.03   ------------------------------------------------------------------------------------------------------------------ No results found for: BNP  Inpatient Medications  Scheduled Meds: . acetaminophen  650 mg Oral Once  . cyanocobalamin  500 mcg Oral Daily  . diclofenac sodium  2 g Topical QID  . diltiazem  60 mg Oral BID  . folic acid  1 mg Oral Daily  . gabapentin  300 mg Oral BID  . insulin aspart  2-8 Units Subcutaneous BID WC  . levothyroxine  50 mcg Oral QAC breakfast  . nitroGLYCERIN  0.2 mg Transdermal Daily  . pantoprazole (PROTONIX) IV  40 mg Intravenous Q12H  . piperacillin-tazobactam (ZOSYN)  IV  2.25 g Intravenous Q8H  . tiotropium  18 mcg Inhalation Q breakfast   Continuous Infusions: . sodium chloride 75 mL/hr at 07/20/16 0234   PRN Meds:.haloperidol lactate, levalbuterol, loperamide, mupirocin ointment, ondansetron **OR** ondansetron (ZOFRAN) IV, sodium chloride flush  Micro Results Recent Results (from the past 240 hour(s))  Blood Culture (routine x 2)     Status: Abnormal (Preliminary result)    Collection Time: 07/18/16  3:59 PM  Result Value Ref Range Status   Specimen Description BLOOD LEFT FOREARM  Final   Special Requests BOTTLES DRAWN AEROBIC AND ANAEROBIC 5CC  Final   Culture  Setup Time   Final    GRAM NEGATIVE RODS IN BOTH AEROBIC AND ANAEROBIC BOTTLES CRITICAL RESULT CALLED TO, READ BACK BY AND VERIFIED WITH: C. SUMME [place], AT [id number] 07/19/16 BY D. VANHOOK    Culture (A)  Final    ESCHERICHIA COLI SUSCEPTIBILITIES TO FOLLOW Performed at [facility]    Report Status PENDING  Incomplete  Blood Culture ID Panel (Reflexed)     Status: Abnormal   Collection Time: 07/18/16  3:59 PM  Result Value Ref Range Status   Enterococcus species NOT DETECTED NOT DETECTED Final   Listeria monocytogenes NOT DETECTED NOT DETECTED Final   Staphylococcus species NOT DETECTED NOT DETECTED Final   Staphylococcus aureus NOT DETECTED NOT DETECTED Final   Streptococcus species NOT DETECTED NOT DETECTED Final   Streptococcus  agalactiae NOT DETECTED NOT DETECTED Final   Streptococcus pneumoniae NOT DETECTED NOT DETECTED Final   Streptococcus pyogenes NOT DETECTED NOT DETECTED Final   Acinetobacter baumannii NOT DETECTED NOT DETECTED Final   Enterobacteriaceae species DETECTED (A) NOT DETECTED Final    Comment: CRITICAL RESULT CALLED TO, READ BACK BY AND VERIFIED WITH: C. SUMME PHARMD, AT [id number] 07/19/16 BY D. VANHOOK    Enterobacter cloacae complex NOT DETECTED NOT DETECTED Final   Escherichia coli DETECTED (A) NOT DETECTED Final    Comment: CRITICAL RESULT CALLED TO, READ BACK BY AND VERIFIED WITH: C. SUMME PHARMD, AT [id number] 07/19/16 BY D. VANHOOK    Klebsiella oxytoca NOT DETECTED NOT DETECTED Final   Klebsiella pneumoniae NOT DETECTED NOT DETECTED Final   Proteus species NOT DETECTED NOT DETECTED Final   Serratia marcescens NOT DETECTED NOT DETECTED Final   Carbapenem resistance NOT DETECTED NOT DETECTED Final   Haemophilus influenzae NOT DETECTED NOT DETECTED Final   Neisseria  meningitidis NOT DETECTED NOT DETECTED Final   Pseudomonas aeruginosa NOT DETECTED NOT DETECTED Final   Candida albicans NOT DETECTED NOT DETECTED Final   Candida glabrata NOT DETECTED NOT DETECTED Final   Candida krusei NOT DETECTED NOT DETECTED Final   Candida parapsilosis NOT DETECTED NOT DETECTED Final   Candida tropicalis NOT DETECTED NOT DETECTED Final    Comment: Performed at [facility]  Urine culture     Status: None   Collection Time: 07/18/16  4:00 PM  Result Value Ref Range Status   Specimen Description URINE, CLEAN CATCH  Final   Special Requests NONE  Final   Culture NO GROWTH Performed at [facility]   Final   Report Status 07/20/2016 FINAL  Final  Blood Culture (routine x 2)     Status: None (Preliminary result)   Collection Time: 07/18/16  4:05 PM  Result Value Ref Range Status   Specimen Description RIGHT ANTECUBITAL  Final   Special Requests BOTTLES DRAWN AEROBIC AND ANAEROBIC 5CC  Final   Culture  Setup Time   Final    GRAM NEGATIVE RODS IN BOTH AEROBIC AND ANAEROBIC BOTTLES CRITICAL RESULT CALLED TO, READ BACK BY AND VERIFIED WITH: C. SUMME [place], AT [id number] 07/19/16 BY D. VANHOOK    Culture   Final    GRAM NEGATIVE RODS IDENTIFICATION AND SUSCEPTIBILITIES TO FOLLOW Performed at [facility]    Report Status PENDING  Incomplete  MRSA PCR Screening     Status: Abnormal   Collection Time: 07/19/16  5:57 AM  Result Value Ref Range Status   MRSA by PCR POSITIVE (A) NEGATIVE Final    Comment:        The GeneXpert MRSA Assay (FDA approved for NASAL specimens only), is one component of a comprehensive MRSA colonization surveillance program. It is not intended to diagnose MRSA infection nor to guide or monitor treatment for MRSA infections. RESULT CALLED TO, READ BACK BY AND VERIFIED WITH: [facility] RN AT 1919 ON 07/19/16 BY S.VANHOORNE     Radiology Reports Ct Abdomen Pelvis W Contrast  Result Date: 07/18/2016 CLINICAL DATA:   Sepsis, transaminitis. Fever with increased lethargy. EXAM: CT ABDOMEN AND PELVIS WITH CONTRAST TECHNIQUE: Multidetector CT imaging of the abdomen and pelvis was performed using the standard protocol following bolus administration of intravenous contrast. CONTRAST:  63mL ISOVUE-300 IOPAMIDOL (ISOVUE-300) INJECTION 61% COMPARISON:  None. FINDINGS: Lower chest: Multi chamber cardiomegaly. No pleural fluid or focal airspace disease. Tiny subpleural nodularity in the periphery of the left lower  lobe without suspicious characteristics. Atherosclerosis of the descending thoracic aorta. Coronary artery calcifications are seen. Hepatobiliary: Postcholecystectomy. Prominent common bile duct measuring 13 mm at the porta hepatis, mild central intrahepatic biliary ductal dilatation. No calcified choledocholithiasis. Questionable common bile duct wall enhancement, an equivocal finding. No focal hepatic lesion. Pancreas: Fatty atrophy. No ductal dilatation or surrounding inflammation. Spleen:  Normal in size and density.  Probable punctate granuloma. Adrenals/Urinary Tract: No adrenal nodule. Bilateral renal cortical thinning without hydronephrosis. Motion artifact limits assessment for perinephric edema. No focal renal lesion. The ureters are decompressed. No excretion on delayed phase imaging consistent with renal dysfunction. Urinary bladder is decompressed and not well evaluated secondary to streak artifact from adjacent left hip prosthesis. Stomach/Bowel: The stomach is decompressed. No small bowel dilatation or inflammation. The appendix is not visualized, reportedly surgically absent. Small volume of colonic stool. There is diverticulosis involving the descending and sigmoid colon. Minimal soft tissue stranding in the deep right pelvis is adjacent to the sigmoid colon. Stranding from left hip prosthesis partially obscures detailed evaluation. No extraluminal air or abscess. Vascular/Lymphatic: Advanced atherosclerosis of  the abdominal aorta and its branches. Calcified plaque leads to luminal narrowing of the distal abdominal aorta just proximal to the iliac bifurcation. Atherosclerotic calcifications of the intra-abdominal vasculature. No definite intra-abdominal or pelvic adenopathy. Small retroperitoneal lymph nodes are not enlarged by size criteria. Reproductive: The uterus is surgically absent. Neither ovary is confidently identified. Other: No ascites or free air. Tiny fat containing umbilical hernia. Musculoskeletal: Mild T12 superior endplate compression fracture is chronic based on chest radiograph 06/18/2013. The bones are under mineralized. Multilevel degenerative change throughout the lumbar spine. Left hip prosthesis in place. No acute osseous abnormality is seen. IMPRESSION: 1. Mild biliary prominence postcholecystectomy. This is a common postsurgical finding. There is equivocal biliary enhancement, which may be normal for this patient or represent ascending cholangitis. No calcified choledocholithiasis. 2. Distal descending and sigmoid colonic diverticulosis. Mild stranding in the deep right pelvis may be secondary to mild uncomplicated acute diverticulitis, however postsurgical scarring given prior hysterectomy could have a similar appearance. No abscess or perforation. 3. Advanced atherosclerosis. 4. Thinning of the renal parenchyma and absence of renal excretion on delayed phase imaging is consistent with chronic renal disease. 5. Osteopenia/osteoporosis.  Chronic T12 compression fracture. Electronically Signed   By: Jeb Levering M.D.   On: 07/18/2016 18:39   Dg Chest Port 1 View  Result Date: 07/19/2016 CLINICAL DATA:  Line placement. EXAM: PORTABLE CHEST 1 VIEW COMPARISON:  07/18/2016 . FINDINGS: Right PICC line noted with tip projected over the superior vena cava. Cardiomegaly with normal pulmonary vascularity. Mild right upper lobe subsegmental atelectasis. No pleural effusion or pneumothorax.  IMPRESSION: 1.  Right PICC line noted with tip projected superior vena cava. 2. Cardiomegaly.  No pulmonary venous congestion. 3. Mild right upper lobe subsegmental atelectasis. Electronically Signed   By: Marcello Moores  Register   On: 07/19/2016 09:39   Dg Chest Port 1 View  Result Date: 07/18/2016 CLINICAL DATA:  FF sepsis, fever EXAM: PORTABLE CHEST 1 VIEW COMPARISON:  02/07/2014 FINDINGS: Cardiomegaly. No confluent airspace opacities or effusions. No acute bony abnormality. IMPRESSION: Cardiomegaly.  No acute findings. Electronically Signed   By: Rolm Baptise M.D.   On: 07/18/2016 16:59     Autumnrose Yore M.D on 07/20/2016 at 2:22 PM  Between 7am to 7pm - Pager - [phone]  After 7pm go to [email or web address] - password [facility]  [facility] -  Office  [phone]

## 2016-07-20 NOTE — Progress Notes (Signed)
Subjective: No abdominal pain. Low-grade fevers last night. No blood in stool.  Objective: Vital signs in last 24 hours: Temp:  [98.9 F (37.2 C)-100.5 F (38.1 C)] 100.3 F (37.9 C) (12/07 0510) Pulse Rate:  [86-111] 86 (12/07 0510) Resp:  [20] 20 (12/07 0510) BP: (122-152)/(52-60) 133/56 (12/07 0510) SpO2:  [97 %-99 %] 97 % (12/07 0839) Weight change:  Last BM Date: 07/19/16  PE: GEN:  Much more alert and interactive, NAD SKIN:  Extensive ecchymoses ABD:  Soft, non-tender, protuberant  Lab Results: CBC    Component Value Date/Time   WBC 8.6 07/20/2016 0354   RBC 2.89 (L) 07/20/2016 0354   HGB 9.3 (L) 07/20/2016 0354   HCT 28.2 (L) 07/20/2016 0354   PLT 113 (L) 07/20/2016 0354   MCV 97.6 07/20/2016 0354   MCH 32.2 07/20/2016 0354   MCHC 33.0 07/20/2016 0354   RDW 15.9 (H) 07/20/2016 0354   LYMPHSABS 0.2 (L) 07/18/2016 1602   MONOABS 0.6 07/18/2016 1602   EOSABS 0.0 07/18/2016 1602   BASOSABS 0.0 07/18/2016 1602   CMP     Component Value Date/Time   NA 140 07/20/2016 0354   K 3.6 07/20/2016 0354   CL 109 07/20/2016 0354   CO2 22 07/20/2016 0354   GLUCOSE 98 07/20/2016 0354   BUN 29 (H) 07/20/2016 0354   CREATININE 1.61 (H) 07/20/2016 0354   CREATININE 1.28 (H) 04/02/2014 1436   CALCIUM 7.6 (L) 07/20/2016 0354   PROT 5.5 (L) 07/20/2016 0354   ALBUMIN 3.4 (L) 07/20/2016 0354   AST 277 (H) 07/20/2016 0354   ALT 300 (H) 07/20/2016 0354   ALKPHOS 152 (H) 07/20/2016 0354   BILITOT 3.4 (H) 07/20/2016 0354   GFRNONAA 26 (L) 07/20/2016 0354   GFRNONAA 37 (L) 04/02/2014 1436   GFRAA 31 (L) 07/20/2016 0354   GFRAA 42 (L) 04/02/2014 1436   Assessment:  1.  Fevers with suspected sepsis syndrome. 2.  Elevated LFTs, overall improving. 3.  Altered mental status, much improved. 4.  Post-cholecystectomy. 5.  Multiple medical problems. 6.  Chronic anticoagulation (Eliquis).  Plan:  1.  Continue antibiotics. 2.  MRCP today. 3.  Next step in management pending  MRCP findings.  4.  Case discussed in detail with patient and her daughter at the bedside.   Landry Dyke 07/20/2016, 12:00 PM   Pager [phone] If no answer or after 5 PM call [phone]

## 2016-07-21 ENCOUNTER — Inpatient Hospital Stay (HOSPITAL_COMMUNITY): Payer: Medicare Other

## 2016-07-21 DIAGNOSIS — Z794 Long term (current) use of insulin: Secondary | ICD-10-CM

## 2016-07-21 DIAGNOSIS — R7881 Bacteremia: Secondary | ICD-10-CM

## 2016-07-21 DIAGNOSIS — E118 Type 2 diabetes mellitus with unspecified complications: Secondary | ICD-10-CM

## 2016-07-21 LAB — CULTURE, BLOOD (ROUTINE X 2)

## 2016-07-21 LAB — MAGNESIUM: MAGNESIUM: 1.6 mg/dL — AB (ref 1.7–2.4)

## 2016-07-21 LAB — HEPATIC FUNCTION PANEL
ALK PHOS: 143 U/L — AB (ref 38–126)
ALT: 183 U/L — AB (ref 14–54)
AST: 123 U/L — AB (ref 15–41)
Albumin: 2.8 g/dL — ABNORMAL LOW (ref 3.5–5.0)
Bilirubin, Direct: 2.3 mg/dL — ABNORMAL HIGH (ref 0.1–0.5)
Indirect Bilirubin: 1.8 mg/dL — ABNORMAL HIGH (ref 0.3–0.9)
TOTAL PROTEIN: 5 g/dL — AB (ref 6.5–8.1)
Total Bilirubin: 4.1 mg/dL — ABNORMAL HIGH (ref 0.3–1.2)

## 2016-07-21 LAB — BASIC METABOLIC PANEL
Anion gap: 6 (ref 5–15)
BUN: 26 mg/dL — AB (ref 6–20)
CHLORIDE: 111 mmol/L (ref 101–111)
CO2: 23 mmol/L (ref 22–32)
CREATININE: 1.3 mg/dL — AB (ref 0.44–1.00)
Calcium: 7.3 mg/dL — ABNORMAL LOW (ref 8.9–10.3)
GFR calc non Af Amer: 34 mL/min — ABNORMAL LOW (ref >60)
GFR, EST AFRICAN AMERICAN: 40 mL/min — AB (ref >60)
Glucose, Bld: 134 mg/dL — ABNORMAL HIGH (ref 65–99)
POTASSIUM: 3 mmol/L — AB (ref 3.5–5.1)
SODIUM: 140 mmol/L (ref 135–145)

## 2016-07-21 LAB — GLUCOSE, CAPILLARY
GLUCOSE-CAPILLARY: 166 mg/dL — AB (ref 65–99)
Glucose-Capillary: 133 mg/dL — ABNORMAL HIGH (ref 65–99)
Glucose-Capillary: 175 mg/dL — ABNORMAL HIGH (ref 65–99)

## 2016-07-21 MED ORDER — CHLORHEXIDINE GLUCONATE CLOTH 2 % EX PADS
6.0000 | MEDICATED_PAD | Freq: Every day | CUTANEOUS | Status: DC
  Administered 2016-07-22 – 2016-07-24 (×3): 6 via TOPICAL

## 2016-07-21 MED ORDER — FUROSEMIDE 10 MG/ML IJ SOLN
20.0000 mg | Freq: Once | INTRAMUSCULAR | Status: AC
  Administered 2016-07-21: 20 mg via INTRAVENOUS
  Filled 2016-07-21: qty 2

## 2016-07-21 MED ORDER — HEPARIN SODIUM (PORCINE) 5000 UNIT/ML IJ SOLN
5000.0000 [IU] | Freq: Three times a day (TID) | INTRAMUSCULAR | Status: DC
  Administered 2016-07-21 – 2016-07-22 (×2): 5000 [IU] via SUBCUTANEOUS
  Filled 2016-07-21 (×2): qty 1

## 2016-07-21 MED ORDER — FUROSEMIDE 10 MG/ML IJ SOLN: 40.0000 mg | Freq: Once | INTRAMUSCULAR | Status: DC

## 2016-07-21 MED ORDER — POTASSIUM CHLORIDE CRYS ER 20 MEQ PO TBCR
40.0000 meq | EXTENDED_RELEASE_TABLET | ORAL | Status: AC
  Administered 2016-07-21 (×3): 40 meq via ORAL
  Filled 2016-07-21 (×3): qty 2

## 2016-07-21 MED ORDER — DEXTROSE 5 % IV SOLN
2.0000 g | INTRAVENOUS | Status: DC
  Administered 2016-07-21 – 2016-07-24 (×4): 2 g via INTRAVENOUS
  Filled 2016-07-21 (×4): qty 2

## 2016-07-21 MED ORDER — RISAQUAD PO CAPS
2.0000 | ORAL_CAPSULE | Freq: Every day | ORAL | Status: DC
  Administered 2016-07-21 – 2016-07-24 (×4): 2 via ORAL
  Filled 2016-07-21 (×4): qty 2

## 2016-07-21 MED ORDER — PIPERACILLIN-TAZOBACTAM 3.375 G IVPB: 3.3750 g | Freq: Three times a day (TID) | INTRAVENOUS | Status: DC

## 2016-07-21 MED ORDER — MUPIROCIN 2 % EX OINT
1.0000 "application " | TOPICAL_OINTMENT | Freq: Two times a day (BID) | CUTANEOUS | Status: DC
  Administered 2016-07-21 – 2016-07-24 (×7): 1 via NASAL
  Filled 2016-07-21 (×2): qty 22

## 2016-07-21 MED ORDER — MAGNESIUM SULFATE 2 GM/50ML IV SOLN
2.0000 g | Freq: Once | INTRAVENOUS | Status: AC
  Administered 2016-07-21: 2 g via INTRAVENOUS
  Filled 2016-07-21: qty 50

## 2016-07-21 NOTE — Progress Notes (Signed)
Subjective: No abdominal pain.  Objective: Vital signs in last 24 hours: Temp:  [97.5 F (36.4 C)-98.4 F (36.9 C)] 97.5 F (36.4 C) (12/08 0641) Pulse Rate:  [68-84] 68 (12/08 0641) Resp:  [18] 18 (12/08 0641) BP: (126-150)/(52-67) 143/67 (12/08 0641) SpO2:  [94 %-97 %] 94 % (12/08 0845) Weight change:  Last BM Date: 07/19/16  PE: GEN:  Alert, NAD ABD:  Protuberant, soft  Lab Results: CBC    Component Value Date/Time   WBC 8.6 07/20/2016 0354   RBC 2.89 (L) 07/20/2016 0354   HGB 9.3 (L) 07/20/2016 0354   HCT 28.2 (L) 07/20/2016 0354   PLT 113 (L) 07/20/2016 0354   MCV 97.6 07/20/2016 0354   MCH 32.2 07/20/2016 0354   MCHC 33.0 07/20/2016 0354   RDW 15.9 (H) 07/20/2016 0354   LYMPHSABS 0.2 (L) 07/18/2016 1602   MONOABS 0.6 07/18/2016 1602   EOSABS 0.0 07/18/2016 1602   BASOSABS 0.0 07/18/2016 1602   CMP     Component Value Date/Time   NA 140 07/21/2016 0435   K 3.0 (L) 07/21/2016 0435   CL 111 07/21/2016 0435   CO2 23 07/21/2016 0435   GLUCOSE 134 (H) 07/21/2016 0435   BUN 26 (H) 07/21/2016 0435   CREATININE 1.30 (H) 07/21/2016 0435   CREATININE 1.28 (H) 04/02/2014 1436   CALCIUM 7.3 (L) 07/21/2016 0435   PROT 5.0 (L) 07/21/2016 0435   ALBUMIN 2.8 (L) 07/21/2016 0435   AST 123 (H) 07/21/2016 0435   ALT 183 (H) 07/21/2016 0435   ALKPHOS 143 (H) 07/21/2016 0435   BILITOT 4.1 (H) 07/21/2016 0435   GFRNONAA 34 (L) 07/21/2016 0435   GFRNONAA 37 (L) 04/02/2014 1436   GFRAA 40 (L) 07/21/2016 0435   GFRAA 42 (L) 04/02/2014 1436    Assessment:  1. Fevers with suspected sepsis syndrome.  Afebrile past 24 hours. 2. Elevated LFTs, transaminases improving, bilirubin rising. 3. Altered mental status, resolved. 4. Post-cholecystectomy. 5. Multiple medical problems. 6. Chronic anticoagulation (Eliquis).  Plan:  1.  Heart Healthy diet is ok. 2.  Continue antibiotics.  Follow LFTs. 3.  Awaiting MRCP findings (ordered nearly 24 hours ago!). 4.  Next step  in management pending MRCP findings. 5.  [facility] will follow.   Landry Dyke 07/21/2016, 10:06 AM   Pager [phone] If no answer or after 5 PM call [phone]

## 2016-07-21 NOTE — Progress Notes (Addendum)
Pharmacy Antibiotic Note  Sydney [name] is a 82 y.o. female admitted on 07/18/2016 with sepsis. Comes from nursing home with fever, n/v, and increasing lethargy.  Hx of diabetes, Afib, HF and COPD. Imaging studies revealed possibility of ascending cholangitis as well as acute diverticulitis. Pharmacy initially consulted for vancomycin and zosyn dosing. Blood cultures now positive for E.coli, and Vancomycin has subsequently been discontinued.   Today, 07/21/2016:  Afebrile since 12/7  E coli pansensitive  Plan:  Change to Rocephin 2g IV q24 hr (higher dose for bacteremia). Would avoid Ancef or Keflex with Ancef MIC < /=4 (now considered intermed instead of sens)  Pharmacy will sign off as no further dose adjustments warranted   Height: 5\' 3"  (160 cm) Weight: 155 lb 13.8 oz (70.7 kg) IBW/kg (Calculated) : 52.4  Temp (24hrs), Avg:98 F (36.7 C), Min:97.5 F (36.4 C), Max:98.4 F (36.9 C)   Recent Labs Lab 07/18/16 1602 07/18/16 1627 07/18/16 2222 07/19/16 0105 07/19/16 0343 07/20/16 0354 07/21/16 0435  WBC 8.7  --   --   --  12.0* 8.6  --   CREATININE 1.40*  --   --   --  1.79* 1.61* 1.30*  LATICACIDVEN  --  3.19* 3.2* 3.9* 3.7* 1.0  --     Estimated Creatinine Clearance: 25.5 mL/min (by C-G formula based on SCr of 1.3 mg/dL (H)).    Allergies  Allergen Reactions  . Other     Narcotics=makes her very disoriented  . Percocet [Oxycodone-Acetaminophen] Other (See Comments)    Dilerium, hallucinations  . Tramadol Anxiety  . Vicodin [Hydrocodone-Acetaminophen] Anxiety   Antimicrobials this admission:  12/5 vanc >> 12/6 12/5 zosyn >> 12/8 12/8 Rocephin >>   Dose adjustments this admission:  12/6: Zosyn to 2.25g q8h for CrCl < 20 ml/min  Microbiology results:  12/5 BCx: 2/2 with GNR (BCID = E.coli, no resistance patterns detected) 12/5 UCx: NGF 12/6 MRSA PCR: positive 12/7 BCx (repeat): ng1d  Thank you for allowing pharmacy to be a part of this patient's  care.   Reuel Boom, PharmD, BCPS Pager: [phone] 07/21/2016, 12:47 PM

## 2016-07-21 NOTE — Progress Notes (Addendum)
PROGRESS NOTE                                                                                                                                                                                                             Patient Demographics:    Sydney [name], is a 82 y.o. female, DOB - Sep 28, 1920, [id number]  Admit date - 07/18/2016   Admitting Physician Norval Morton, MD  Outpatient Primary MD for the patient is Geoffery Lyons, MD  LOS - 3   Chief Complaint  Patient presents with  . Code Sepsis       Brief Narrative   82 y.o. female with medical history significant of  Afib on Eliquis, HTN, HLD, DM type II, Hyopothyroidism, left BKA, COPD, anemia; who presented with reports of confusion and fever, workup significant for elevated LFTs, with imaging evidence of possible cholangitis.   Subjective:    Sydney [name] today Is confused, but denies any complaints, no abdominal pain,   Assessment  & Plan :    Principal Problem:   Sepsis due to undetermined organism with metabolic encephalopathy ([place]) Active Problems:   Hypothyroidism   Diabetes mellitus type 2, controlled ([place])   Essential hypertension   Atrial fibrillation (HCC)   Transaminitis   Hyperbilirubinemia  Sepsis secondary to choledocholithiasis versus cholangitis/ E coli bacteremia - GI input greatly appreciated, Continue with IV fluids , IV antibiotics, plan for MRCP today, and next step depends on MRCP finding. - Continue with IV Zosyn, - Continue with IV fluids, monitor CMP closely - Hold Eliquis for possible need for ERCP - Repeat blood cultures with no growth to date.  Elevated LFTs  - see above discussion  Chronic atrial fibrillation:  - Currently rate controlled on diltiazem  - Hold  Eliquis for possible need of procedure  Thrombocytopenia - Most likely related to sepsis  AKI on Chronic kidney disease stage III:  - Patient's previous baseline noted to be around  1.28 over 2 years ago. Creatinine peaked at 1.79, improving with IV fluids, avoid nephrotoxic medication - Follow-up kidney function in a.m.   Diabetes mellitus type 2: - Patient on metformin and sliding scale insulin at home.  - Hypoglycemic protocol - Hold metformin  - Continue home insulin sliding scale regimen with CBGs twice daily - Continue gabapentin  Essential hypertension - will need  to restart Lasix and potassium chloride when medically appropriate  COPD  - Continuous pulse oximetry overnight with nasal cannula oxygen to keep O2 sats greater than 92% - Continue Spiriva - Xopenex neb prn SOB/Wheezing  Anemia:  - Chronic. Hemoglobin 11 which appears near patient's baseline. - Continue to monitor  History of CHF:  - last echo back in 06/2015 - Strict I&O's   - Monitoring overnight for signs of fluid overload and will give IV Lasix if needed  Hypothyroidism - Continue levothyroxine  GERD - protonix    Code Status : Full  Family Communication  : D/W daughter at bedside  Disposition Plan  : pending further work up.  Consults  :  GI  Procedures  : None  DVT Prophylaxis  :  On Eliquis (on Hold)  Lab Results  Component Value Date   PLT 113 (L) 07/20/2016    Antibiotics  :    Anti-infectives    Start     Dose/Rate Route Frequency Ordered Stop   07/21/16 1400  piperacillin-tazobactam (ZOSYN) IVPB 3.375 g     3.375 g 12.5 mL/hr over 240 Minutes Intravenous Every 8 hours 07/21/16 0841     07/19/16 2200  piperacillin-tazobactam (ZOSYN) IVPB 2.25 g  Status:  Discontinued     2.25 g 100 mL/hr over 30 Minutes Intravenous Every 8 hours 07/19/16 1351 07/21/16 0841   07/19/16 1600  vancomycin (VANCOCIN) IVPB 750 mg/150 ml premix  Status:  Discontinued     750 mg 150 mL/hr over 60 Minutes Intravenous Every 24 hours 07/18/16 2034 07/19/16 1356   07/19/16 1400  piperacillin-tazobactam (ZOSYN) IVPB 2.25 g     2.25 g 100 mL/hr over 30 Minutes Intravenous STAT  07/19/16 1351 07/19/16 1527   07/19/16 0000  piperacillin-tazobactam (ZOSYN) IVPB 3.375 g  Status:  Discontinued     3.375 g 12.5 mL/hr over 240 Minutes Intravenous Every 8 hours 07/18/16 2034 07/19/16 1351   07/18/16 1615  piperacillin-tazobactam (ZOSYN) IVPB 3.375 g     3.375 g 100 mL/hr over 30 Minutes Intravenous  Once 07/18/16 1600 07/18/16 1701   07/18/16 1615  vancomycin (VANCOCIN) IVPB 1000 mg/200 mL premix     1,000 mg 200 mL/hr over 60 Minutes Intravenous  Once 07/18/16 1600 07/18/16 1900        Objective:   Vitals:   07/20/16 1500 07/20/16 2159 07/21/16 0641 07/21/16 0845  BP: (!) 126/52 (!) 150/59 (!) 143/67   Pulse: 78 84 68   Resp: 18 18 18    Temp:  98.4 F (36.9 C) 97.5 F (36.4 C)   TempSrc:  Oral Oral   SpO2: 97% 97% 97% 94%  Weight:      Height:        Wt Readings from Last 3 Encounters:  07/18/16 70.7 kg (155 lb 13.8 oz)  07/16/15 67.1 kg (148 lb)  01/01/15 62.1 kg (137 lb)     Intake/Output Summary (Last 24 hours) at 07/21/16 1110 Last data filed at 07/21/16 0900  Gross per 24 hour  Intake             2260 ml  Output              300 ml  Net             1960 ml     Physical Exam  Awake Alert,Confused, frail Supple Neck,No JVD Symmetrical Chest wall movement, Good air movement bilaterally, CTAB RRR,No Gallops,Rubs or new Murmurs, No Parasternal Heave +ve  B.Sounds, Abd Soft, No tenderness, No rebound - guarding or rigidity. No Cyanosis, left BKA, right transtibial amputation    Data Review:    CBC  Recent Labs Lab 07/18/16 1602 07/19/16 0343 07/20/16 0354  WBC 8.7 12.0* 8.6  HGB 11.0* 10.0* 9.3*  HCT 33.6* 32.1* 28.2*  PLT 141* 99* 113*  MCV 97.1 102.9* 97.6  MCH 31.8 32.1 32.2  MCHC 32.7 31.2 33.0  RDW 15.6* 16.2* 15.9*  LYMPHSABS 0.2*  --   --   MONOABS 0.6  --   --   EOSABS 0.0  --   --   BASOSABS 0.0  --   --     Chemistries   Recent Labs Lab 07/18/16 1602 07/19/16 0343 07/20/16 0354 07/21/16 0435  NA 141 141  140 140  K 4.1 4.4 3.6 3.0*  CL 104 110 109 111  CO2 26 19* 22 23  GLUCOSE 169* 158* 98 134*  BUN 20 22* 29* 26*  CREATININE 1.40* 1.79* 1.61* 1.30*  CALCIUM 9.3 7.6* 7.6* 7.3*  MG  --   --   --  1.6*  AST 1,047* 666* 277* 123*  ALT 493* 522* 300* 183*  ALKPHOS 296* 190* 152* 143*  BILITOT 2.7* 3.2* 3.4* 4.1*   ------------------------------------------------------------------------------------------------------------------ No results for input(s): CHOL, HDL, LDLCALC, TRIG, CHOLHDL, LDLDIRECT in the last 72 hours.  Lab Results  Component Value Date   HGBA1C 7.4 (H) 11/10/2013   ------------------------------------------------------------------------------------------------------------------  Recent Labs  07/18/16 2222  TSH 1.457   ------------------------------------------------------------------------------------------------------------------ No results for input(s): VITAMINB12, FOLATE, FERRITIN, TIBC, IRON, RETICCTPCT in the last 72 hours.  Coagulation profile  Recent Labs Lab 07/18/16 1602  INR 1.44    No results for input(s): DDIMER in the last 72 hours.  Cardiac Enzymes  Recent Labs Lab 07/18/16 2222 07/19/16 0351  TROPONINI 0.03* <0.03   ------------------------------------------------------------------------------------------------------------------ No results found for: BNP  Inpatient Medications  Scheduled Meds: . acetaminophen  650 mg Oral Once  . acidophilus  2 capsule Oral Daily  . Chlorhexidine Gluconate Cloth  6 each Topical Q0600  . cyanocobalamin  500 mcg Oral Daily  . diclofenac sodium  2 g Topical QID  . diltiazem  60 mg Oral BID  . folic acid  1 mg Oral Daily  . gabapentin  300 mg Oral BID  . insulin aspart  2-8 Units Subcutaneous BID WC  . levothyroxine  50 mcg Oral QAC breakfast  . magnesium sulfate 1 - 4 g bolus IVPB  2 g Intravenous Once  . mupirocin ointment  1 application Nasal BID  . nitroGLYCERIN  0.2 mg Transdermal Daily    . pantoprazole (PROTONIX) IV  40 mg Intravenous Q12H  . piperacillin-tazobactam (ZOSYN)  IV  3.375 g Intravenous Q8H  . potassium chloride  40 mEq Oral Q4H  . tiotropium  18 mcg Inhalation Q breakfast   Continuous Infusions: . sodium chloride 75 mL/hr at 07/21/16 0616   PRN Meds:.haloperidol lactate, levalbuterol, loperamide, mupirocin ointment, ondansetron **OR** ondansetron (ZOFRAN) IV, sodium chloride flush  Micro Results Recent Results (from the past 240 hour(s))  Blood Culture (routine x 2)     Status: Abnormal   Collection Time: 07/18/16  3:59 PM  Result Value Ref Range Status   Specimen Description BLOOD LEFT FOREARM  Final   Special Requests BOTTLES DRAWN AEROBIC AND ANAEROBIC 5CC  Final   Culture  Setup Time   Final    GRAM NEGATIVE RODS IN BOTH AEROBIC AND ANAEROBIC BOTTLES CRITICAL RESULT CALLED TO,  READ BACK BY AND VERIFIED WITH: Christella Hartigan PHARMD, AT [id number] 07/19/16 BY D. VANHOOK Performed at [facility] (A)  Final   Report Status 07/21/2016 FINAL  Final   Organism ID, Bacteria ESCHERICHIA COLI  Final      Susceptibility   Escherichia coli - MIC*    AMPICILLIN 8 SENSITIVE Sensitive     CEFAZOLIN <=4 SENSITIVE Sensitive     CEFEPIME <=1 SENSITIVE Sensitive     CEFTAZIDIME <=1 SENSITIVE Sensitive     CEFTRIAXONE <=1 SENSITIVE Sensitive     CIPROFLOXACIN <=0.25 SENSITIVE Sensitive     GENTAMICIN <=1 SENSITIVE Sensitive     IMIPENEM <=0.25 SENSITIVE Sensitive     TRIMETH/SULFA <=20 SENSITIVE Sensitive     AMPICILLIN/SULBACTAM 4 SENSITIVE Sensitive     PIP/TAZO <=4 SENSITIVE Sensitive     Extended ESBL NEGATIVE Sensitive     * ESCHERICHIA COLI  Blood Culture ID Panel (Reflexed)     Status: Abnormal   Collection Time: 07/18/16  3:59 PM  Result Value Ref Range Status   Enterococcus species NOT DETECTED NOT DETECTED Final   Listeria monocytogenes NOT DETECTED NOT DETECTED Final   Staphylococcus species NOT DETECTED NOT DETECTED Final    Staphylococcus aureus NOT DETECTED NOT DETECTED Final   Streptococcus species NOT DETECTED NOT DETECTED Final   Streptococcus agalactiae NOT DETECTED NOT DETECTED Final   Streptococcus pneumoniae NOT DETECTED NOT DETECTED Final   Streptococcus pyogenes NOT DETECTED NOT DETECTED Final   Acinetobacter baumannii NOT DETECTED NOT DETECTED Final   Enterobacteriaceae species DETECTED (A) NOT DETECTED Final    Comment: CRITICAL RESULT CALLED TO, READ BACK BY AND VERIFIED WITH: C. SUMME PHARMD, AT [id number] 07/19/16 BY D. VANHOOK    Enterobacter cloacae complex NOT DETECTED NOT DETECTED Final   Escherichia coli DETECTED (A) NOT DETECTED Final    Comment: CRITICAL RESULT CALLED TO, READ BACK BY AND VERIFIED WITH: C. SUMME PHARMD, AT [id number] 07/19/16 BY D. VANHOOK    Klebsiella oxytoca NOT DETECTED NOT DETECTED Final   Klebsiella pneumoniae NOT DETECTED NOT DETECTED Final   Proteus species NOT DETECTED NOT DETECTED Final   Serratia marcescens NOT DETECTED NOT DETECTED Final   Carbapenem resistance NOT DETECTED NOT DETECTED Final   Haemophilus influenzae NOT DETECTED NOT DETECTED Final   Neisseria meningitidis NOT DETECTED NOT DETECTED Final   Pseudomonas aeruginosa NOT DETECTED NOT DETECTED Final   Candida albicans NOT DETECTED NOT DETECTED Final   Candida glabrata NOT DETECTED NOT DETECTED Final   Candida krusei NOT DETECTED NOT DETECTED Final   Candida parapsilosis NOT DETECTED NOT DETECTED Final   Candida tropicalis NOT DETECTED NOT DETECTED Final    Comment: Performed at [facility]  Urine culture     Status: None   Collection Time: 07/18/16  4:00 PM  Result Value Ref Range Status   Specimen Description URINE, CLEAN CATCH  Final   Special Requests NONE  Final   Culture NO GROWTH Performed at [facility]   Final   Report Status 07/20/2016 FINAL  Final  Blood Culture (routine x 2)     Status: Abnormal   Collection Time: 07/18/16  4:05 PM  Result Value Ref Range Status    Specimen Description RIGHT ANTECUBITAL  Final   Special Requests BOTTLES DRAWN AEROBIC AND ANAEROBIC 5CC  Final   Culture  Setup Time   Final    GRAM NEGATIVE RODS IN BOTH AEROBIC AND ANAEROBIC BOTTLES CRITICAL RESULT  CALLED TO, READ BACK BY AND VERIFIED WITH: C. SUMME PHARMD, AT [id number] 07/19/16 BY D. VANHOOK    Culture (A)  Final    ESCHERICHIA COLI SUSCEPTIBILITIES PERFORMED ON PREVIOUS CULTURE WITHIN THE LAST 5 DAYS. Performed at [facility]    Report Status 07/21/2016 FINAL  Final  MRSA PCR Screening     Status: Abnormal   Collection Time: 07/19/16  5:57 AM  Result Value Ref Range Status   MRSA by PCR POSITIVE (A) NEGATIVE Final    Comment:        The GeneXpert MRSA Assay (FDA approved for NASAL specimens only), is one component of a comprehensive MRSA colonization surveillance program. It is not intended to diagnose MRSA infection nor to guide or monitor treatment for MRSA infections. RESULT CALLED TO, READ BACK BY AND VERIFIED WITH: [facility] RN AT 1919 ON 07/19/16 BY S.VANHOORNE   Culture, blood (Routine X 2) w Reflex to ID Panel     Status: None (Preliminary result)   Collection Time: 07/20/16 10:52 AM  Result Value Ref Range Status   Specimen Description BLOOD LEFT ARM  Final   Special Requests IN PEDIATRIC BOTTLE 4 CC  Final   Culture   Final    NO GROWTH < 24 HOURS Performed at [facility]    Report Status PENDING  Incomplete  Culture, blood (Routine X 2) w Reflex to ID Panel     Status: None (Preliminary result)   Collection Time: 07/20/16 10:52 AM  Result Value Ref Range Status   Specimen Description BLOOD LEFT HAND  Final   Special Requests IN PEDIATRIC BOTTLE 2 CC  Final   Culture   Final    NO GROWTH < 24 HOURS Performed at [facility]    Report Status PENDING  Incomplete    Radiology Reports Ct Abdomen Pelvis W Contrast  Result Date: 07/18/2016 CLINICAL DATA:  Sepsis, transaminitis. Fever with increased lethargy. EXAM: CT  ABDOMEN AND PELVIS WITH CONTRAST TECHNIQUE: Multidetector CT imaging of the abdomen and pelvis was performed using the standard protocol following bolus administration of intravenous contrast. CONTRAST:  65mL ISOVUE-300 IOPAMIDOL (ISOVUE-300) INJECTION 61% COMPARISON:  None. FINDINGS: Lower chest: Multi chamber cardiomegaly. No pleural fluid or focal airspace disease. Tiny subpleural nodularity in the periphery of the left lower lobe without suspicious characteristics. Atherosclerosis of the descending thoracic aorta. Coronary artery calcifications are seen. Hepatobiliary: Postcholecystectomy. Prominent common bile duct measuring 13 mm at the porta hepatis, mild central intrahepatic biliary ductal dilatation. No calcified choledocholithiasis. Questionable common bile duct wall enhancement, an equivocal finding. No focal hepatic lesion. Pancreas: Fatty atrophy. No ductal dilatation or surrounding inflammation. Spleen:  Normal in size and density.  Probable punctate granuloma. Adrenals/Urinary Tract: No adrenal nodule. Bilateral renal cortical thinning without hydronephrosis. Motion artifact limits assessment for perinephric edema. No focal renal lesion. The ureters are decompressed. No excretion on delayed phase imaging consistent with renal dysfunction. Urinary bladder is decompressed and not well evaluated secondary to streak artifact from adjacent left hip prosthesis. Stomach/Bowel: The stomach is decompressed. No small bowel dilatation or inflammation. The appendix is not visualized, reportedly surgically absent. Small volume of colonic stool. There is diverticulosis involving the descending and sigmoid colon. Minimal soft tissue stranding in the deep right pelvis is adjacent to the sigmoid colon. Stranding from left hip prosthesis partially obscures detailed evaluation. No extraluminal air or abscess. Vascular/Lymphatic: Advanced atherosclerosis of the abdominal aorta and its branches. Calcified plaque leads to  luminal narrowing of the  distal abdominal aorta just proximal to the iliac bifurcation. Atherosclerotic calcifications of the intra-abdominal vasculature. No definite intra-abdominal or pelvic adenopathy. Small retroperitoneal lymph nodes are not enlarged by size criteria. Reproductive: The uterus is surgically absent. Neither ovary is confidently identified. Other: No ascites or free air. Tiny fat containing umbilical hernia. Musculoskeletal: Mild T12 superior endplate compression fracture is chronic based on chest radiograph 06/18/2013. The bones are under mineralized. Multilevel degenerative change throughout the lumbar spine. Left hip prosthesis in place. No acute osseous abnormality is seen. IMPRESSION: 1. Mild biliary prominence postcholecystectomy. This is a common postsurgical finding. There is equivocal biliary enhancement, which may be normal for this patient or represent ascending cholangitis. No calcified choledocholithiasis. 2. Distal descending and sigmoid colonic diverticulosis. Mild stranding in the deep right pelvis may be secondary to mild uncomplicated acute diverticulitis, however postsurgical scarring given prior hysterectomy could have a similar appearance. No abscess or perforation. 3. Advanced atherosclerosis. 4. Thinning of the renal parenchyma and absence of renal excretion on delayed phase imaging is consistent with chronic renal disease. 5. Osteopenia/osteoporosis.  Chronic T12 compression fracture. Electronically Signed   By: Jeb Levering M.D.   On: 07/18/2016 18:39   Dg Chest Port 1 View  Result Date: 07/19/2016 CLINICAL DATA:  Line placement. EXAM: PORTABLE CHEST 1 VIEW COMPARISON:  07/18/2016 . FINDINGS: Right PICC line noted with tip projected over the superior vena cava. Cardiomegaly with normal pulmonary vascularity. Mild right upper lobe subsegmental atelectasis. No pleural effusion or pneumothorax. IMPRESSION: 1.  Right PICC line noted with tip projected superior vena  cava. 2. Cardiomegaly.  No pulmonary venous congestion. 3. Mild right upper lobe subsegmental atelectasis. Electronically Signed   By: Marcello Moores  Register   On: 07/19/2016 09:39   Dg Chest Port 1 View  Result Date: 07/18/2016 CLINICAL DATA:  FF sepsis, fever EXAM: PORTABLE CHEST 1 VIEW COMPARISON:  02/07/2014 FINDINGS: Cardiomegaly. No confluent airspace opacities or effusions. No acute bony abnormality. IMPRESSION: Cardiomegaly.  No acute findings. Electronically Signed   By: Rolm Baptise M.D.   On: 07/18/2016 16:59     Brelan Hannen M.D on 07/21/2016 at 11:10 AM  Between 7am to 7pm - Pager - [phone]  After 7pm go to [email or web address] - password [facility]  [facility] -  Office  [phone]

## 2016-07-22 LAB — CBC
HEMATOCRIT: 26.2 % — AB (ref 36.0–46.0)
Hemoglobin: 8.8 g/dL — ABNORMAL LOW (ref 12.0–15.0)
MCH: 32.4 pg (ref 26.0–34.0)
MCHC: 33.6 g/dL (ref 30.0–36.0)
MCV: 96.3 fL (ref 78.0–100.0)
Platelets: 117 10*3/uL — ABNORMAL LOW (ref 150–400)
RBC: 2.72 MIL/uL — ABNORMAL LOW (ref 3.87–5.11)
RDW: 15.8 % — AB (ref 11.5–15.5)
WBC: 4.5 10*3/uL (ref 4.0–10.5)

## 2016-07-22 LAB — COMPREHENSIVE METABOLIC PANEL
ALT: 141 U/L — ABNORMAL HIGH (ref 14–54)
ANION GAP: 9 (ref 5–15)
AST: 72 U/L — AB (ref 15–41)
Albumin: 3.1 g/dL — ABNORMAL LOW (ref 3.5–5.0)
Alkaline Phosphatase: 162 U/L — ABNORMAL HIGH (ref 38–126)
BILIRUBIN TOTAL: 3.6 mg/dL — AB (ref 0.3–1.2)
BUN: 22 mg/dL — AB (ref 6–20)
CO2: 22 mmol/L (ref 22–32)
Calcium: 7.9 mg/dL — ABNORMAL LOW (ref 8.9–10.3)
Chloride: 108 mmol/L (ref 101–111)
Creatinine, Ser: 1.25 mg/dL — ABNORMAL HIGH (ref 0.44–1.00)
GFR calc Af Amer: 42 mL/min — ABNORMAL LOW (ref >60)
GFR calc non Af Amer: 36 mL/min — ABNORMAL LOW (ref >60)
Glucose, Bld: 145 mg/dL — ABNORMAL HIGH (ref 65–99)
POTASSIUM: 4.1 mmol/L (ref 3.5–5.1)
Sodium: 139 mmol/L (ref 135–145)
TOTAL PROTEIN: 5.7 g/dL — AB (ref 6.5–8.1)

## 2016-07-22 LAB — GLUCOSE, CAPILLARY
GLUCOSE-CAPILLARY: 146 mg/dL — AB (ref 65–99)
Glucose-Capillary: 189 mg/dL — ABNORMAL HIGH (ref 65–99)
Glucose-Capillary: 138 mg/dL — ABNORMAL HIGH (ref 65–99)

## 2016-07-22 MED ORDER — APIXABAN 2.5 MG PO TABS
2.5000 mg | ORAL_TABLET | Freq: Two times a day (BID) | ORAL | Status: DC
  Administered 2016-07-22 – 2016-07-24 (×5): 2.5 mg via ORAL
  Filled 2016-07-22 (×5): qty 1

## 2016-07-22 NOTE — Progress Notes (Signed)
[facility] Progress Note  Sydney [name] 82 y.o. 05/19/1921  CC:  Abnormal LFTs, sepsis, confusion   Subjective: Patient is more alert today. Knows her date of birth and that she is in the hospital. Denied abdominal pain. Denied vomiting. Tolerating diet.  ROS : Negative for abdominal pain and vomiting   Objective: Vital signs in last 24 hours: Vitals:   07/22/16 0327 07/22/16 0823  BP: (!) 159/59   Pulse: 83 72  Resp: 18 16  Temp: 98.1 F (36.7 C)     Physical Exam:  General:  Alert, cooperative, no distress,   Head:  Normocephalic, without obvious abnormality, atraumatic  Eyes:  , EOM's intact,   Lungs:   Clear to auscultation bilaterally, respirations unlabored  Heart:  Regular rate and rhythm, S1, S2 normal  Abdomen:   Soft, non-tender, bowel sounds active all four quadrants,  no masses,   Extremities: Left BKA and right trans-tibial amputation.        Lab Results:  Recent Labs  07/21/16 0435 07/22/16 0454  NA 140 139  K 3.0* 4.1  CL 111 108  CO2 23 22  GLUCOSE 134* 145*  BUN 26* 22*  CREATININE 1.30* 1.25*  CALCIUM 7.3* 7.9*  MG 1.6*  --     Recent Labs  07/21/16 0435 07/22/16 0454  AST 123* 72*  ALT 183* 141*  ALKPHOS 143* 162*  BILITOT 4.1* 3.6*  PROT 5.0* 5.7*  ALBUMIN 2.8* 3.1*    Recent Labs  07/20/16 0354 07/22/16 0454  WBC 8.6 4.5  HGB 9.3* 8.8*  HCT 28.2* 26.2*  MCV 97.6 96.3  PLT 113* 117*   No results for input(s): LABPROT, INR in the last 72 hours.    Assessment/Plan: - Abnormal LFTs. MRCP negative for biliary obstruction or mass - Confusion. Improving - Escherichia coli bacteremia. On antibiotics - Chronic atrial fibrillation. Eliquis on hold  Recommendations ------------------------- - LFTs stable/improving. MRCP negative. No plan for ERCP at this time - Continue antibiotics per primary team - Okay to resume Eliquis from GI standpoint - GI will sign off. Call us back if needed   Otis Brace MD,  FACP 07/22/2016, 9:29 AM  Pager [phone]  If no answer or after 5 PM call [phone]

## 2016-07-22 NOTE — Progress Notes (Signed)
PROGRESS NOTE                                                                                                                                                                                                             Patient Demographics:    Sydney [name], is a 82 y.o. female, DOB - 04/27/1921, [id number]  Admit date - 07/18/2016   Admitting Physician Norval Morton, MD  Outpatient Primary MD for the patient is Geoffery Lyons, MD  LOS - 4   Chief Complaint  Patient presents with  . Code Sepsis       Brief Narrative   82 y.o. female with medical history significant of  Afib on Eliquis, HTN, HLD, DM type II, Hyopothyroidism, left BKA, COPD, anemia; who presented with reports of confusion and fever, workup significant for elevated LFTs, with imaging evidence of possible cholangitis, seen by GI , MRCP with no acute findings, no plan for ERCP.   Subjective:    Sydney [name] todayDenies any complaints, reports good appetite, no nausea vomiting or abdominal pain.   Assessment  & Plan :    Principal Problem:   Sepsis due to undetermined organism with metabolic encephalopathy ([place]) Active Problems:   Hypothyroidism   Diabetes mellitus type 2, controlled ([place])   Essential hypertension   Atrial fibrillation (HCC)   Transaminitis   Hyperbilirubinemia  Sepsis secondary to choledocholithiasis versus cholangitis/ E coli bacteremia - GI input greatly appreciated, Continue with IV fluids , IV antibiotics, MRCP 12/8 negative, so per GI no plan for ERCP at this time . - Initially on IV Zosyn, transition to IV Rocephin giving sensitivities , and be discharged on amoxicillin difference total of 14 days on discharge  - LFTs stable/improving  - No plan for further procedures, so we'll resume on Eliquis  Elevated LFTs  - see above discussion  Chronic atrial fibrillation:  - Currently rate controlled on diltiazem  - Resumed on  Eliquis  Thrombocytopenia - Most likely related to sepsis  AKI on Chronic kidney disease stage III:  - Patient's previous baseline noted to be around 1.28 over 2 years ago. Creatinine peaked at 1.79, improving with IV fluids, avoid nephrotoxic medication - Follow-up kidney function in a.m.   Diabetes mellitus type 2: - Patient on metformin and sliding scale insulin at home.  - Hypoglycemic protocol -  Hold metformin  - Continue home insulin sliding scale regimen with CBGs twice daily - Continue gabapentin  Essential hypertension - will need to restart Lasix and potassium chloride when medically appropriate  COPD  - Continuous pulse oximetry overnight with nasal cannula oxygen to keep O2 sats greater than 92% - Continue Spiriva - Xopenex neb prn SOB/Wheezing  Anemia:  - Chronic. Hemoglobin 11 which appears near patient's baseline. - Continue to monitor  History of CHF:  - last echo back in 06/2015 - Strict I&O's   - Monitoring overnight for signs of fluid overload and will give IV Lasix if needed  Hypothyroidism - Continue levothyroxine  GERD - protonix    Code Status : Full  Family Communication  : D/W daughter at bedside  Disposition Plan  : pending further work up.  Consults  :  GI  Procedures  : None  DVT Prophylaxis  :  On Eliquis   Lab Results  Component Value Date   PLT 117 (L) 07/22/2016    Antibiotics  :    Anti-infectives    Start     Dose/Rate Route Frequency Ordered Stop   07/21/16 1400  piperacillin-tazobactam (ZOSYN) IVPB 3.375 g  Status:  Discontinued     3.375 g 12.5 mL/hr over 240 Minutes Intravenous Every 8 hours 07/21/16 0841 07/21/16 1242   07/21/16 1400  cefTRIAXone (ROCEPHIN) 2 g in dextrose 5 % 50 mL IVPB     2 g 100 mL/hr over 30 Minutes Intravenous Every 24 hours 07/21/16 1242     07/19/16 2200  piperacillin-tazobactam (ZOSYN) IVPB 2.25 g  Status:  Discontinued     2.25 g 100 mL/hr over 30 Minutes Intravenous Every 8  hours 07/19/16 1351 07/21/16 0841   07/19/16 1600  vancomycin (VANCOCIN) IVPB 750 mg/150 ml premix  Status:  Discontinued     750 mg 150 mL/hr over 60 Minutes Intravenous Every 24 hours 07/18/16 2034 07/19/16 1356   07/19/16 1400  piperacillin-tazobactam (ZOSYN) IVPB 2.25 g     2.25 g 100 mL/hr over 30 Minutes Intravenous STAT 07/19/16 1351 07/19/16 1527   07/19/16 0000  piperacillin-tazobactam (ZOSYN) IVPB 3.375 g  Status:  Discontinued     3.375 g 12.5 mL/hr over 240 Minutes Intravenous Every 8 hours 07/18/16 2034 07/19/16 1351   07/18/16 1615  piperacillin-tazobactam (ZOSYN) IVPB 3.375 g     3.375 g 100 mL/hr over 30 Minutes Intravenous  Once 07/18/16 1600 07/18/16 1701   07/18/16 1615  vancomycin (VANCOCIN) IVPB 1000 mg/200 mL premix     1,000 mg 200 mL/hr over 60 Minutes Intravenous  Once 07/18/16 1600 07/18/16 1900        Objective:   Vitals:   07/21/16 1430 07/21/16 2029 07/22/16 0327 07/22/16 0823  BP: (!) 139/56 (!) 161/69 (!) 159/59   Pulse: (!) 59 79 83 72  Resp: 16 16 18 16   Temp: 97.6 F (36.4 C) 98.5 F (36.9 C) 98.1 F (36.7 C)   TempSrc: Oral Oral Oral   SpO2: 97% 96% 97% 98%  Weight:      Height:        Wt Readings from Last 3 Encounters:  07/18/16 70.7 kg (155 lb 13.8 oz)  07/16/15 67.1 kg (148 lb)  01/01/15 62.1 kg (137 lb)     Intake/Output Summary (Last 24 hours) at 07/22/16 1148 Last data filed at 07/22/16 0300  Gross per 24 hour  Intake  600 ml  Output                0 ml  Net              600 ml     Physical Exam  Awake Alert,Confused, frail Supple Neck,No JVD Symmetrical Chest wall movement, Good air movement bilaterally, CTAB RRR,No Gallops,Rubs or new Murmurs, No Parasternal Heave +ve B.Sounds, Abd Soft, No tenderness, No rebound - guarding or rigidity. No Cyanosis, left BKA, right transtibial amputation    Data Review:    CBC  Recent Labs Lab 07/18/16 1602 07/19/16 0343 07/20/16 0354 07/22/16 0454  WBC  8.7 12.0* 8.6 4.5  HGB 11.0* 10.0* 9.3* 8.8*  HCT 33.6* 32.1* 28.2* 26.2*  PLT 141* 99* 113* 117*  MCV 97.1 102.9* 97.6 96.3  MCH 31.8 32.1 32.2 32.4  MCHC 32.7 31.2 33.0 33.6  RDW 15.6* 16.2* 15.9* 15.8*  LYMPHSABS 0.2*  --   --   --   MONOABS 0.6  --   --   --   EOSABS 0.0  --   --   --   BASOSABS 0.0  --   --   --     Chemistries   Recent Labs Lab 07/18/16 1602 07/19/16 0343 07/20/16 0354 07/21/16 0435 07/22/16 0454  NA 141 141 140 140 139  K 4.1 4.4 3.6 3.0* 4.1  CL 104 110 109 111 108  CO2 26 19* 22 23 22   GLUCOSE 169* 158* 98 134* 145*  BUN 20 22* 29* 26* 22*  CREATININE 1.40* 1.79* 1.61* 1.30* 1.25*  CALCIUM 9.3 7.6* 7.6* 7.3* 7.9*  MG  --   --   --  1.6*  --   AST 1,047* 666* 277* 123* 72*  ALT 493* 522* 300* 183* 141*  ALKPHOS 296* 190* 152* 143* 162*  BILITOT 2.7* 3.2* 3.4* 4.1* 3.6*   ------------------------------------------------------------------------------------------------------------------ No results for input(s): CHOL, HDL, LDLCALC, TRIG, CHOLHDL, LDLDIRECT in the last 72 hours.  Lab Results  Component Value Date   HGBA1C 7.4 (H) 11/10/2013   ------------------------------------------------------------------------------------------------------------------ No results for input(s): TSH, T4TOTAL, T3FREE, THYROIDAB in the last 72 hours.  Invalid input(s): FREET3 ------------------------------------------------------------------------------------------------------------------ No results for input(s): VITAMINB12, FOLATE, FERRITIN, TIBC, IRON, RETICCTPCT in the last 72 hours.  Coagulation profile  Recent Labs Lab 07/18/16 1602  INR 1.44    No results for input(s): DDIMER in the last 72 hours.  Cardiac Enzymes  Recent Labs Lab 07/18/16 2222 07/19/16 0351  TROPONINI 0.03* <0.03   ------------------------------------------------------------------------------------------------------------------ No results found for: BNP  Inpatient  Medications  Scheduled Meds: . acetaminophen  650 mg Oral Once  . acidophilus  2 capsule Oral Daily  . cefTRIAXone (ROCEPHIN)  IV  2 g Intravenous Q24H  . Chlorhexidine Gluconate Cloth  6 each Topical Q0600  . cyanocobalamin  500 mcg Oral Daily  . diclofenac sodium  2 g Topical QID  . diltiazem  60 mg Oral BID  . folic acid  1 mg Oral Daily  . gabapentin  300 mg Oral BID  . insulin aspart  2-8 Units Subcutaneous BID WC  . levothyroxine  50 mcg Oral QAC breakfast  . mupirocin ointment  1 application Nasal BID  . nitroGLYCERIN  0.2 mg Transdermal Daily  . pantoprazole (PROTONIX) IV  40 mg Intravenous Q12H  . tiotropium  18 mcg Inhalation Q breakfast   Continuous Infusions:  PRN Meds:.haloperidol lactate, levalbuterol, loperamide, mupirocin ointment, ondansetron **OR** ondansetron (ZOFRAN) IV, sodium chloride flush  Micro  Results Recent Results (from the past 240 hour(s))  Blood Culture (routine x 2)     Status: Abnormal   Collection Time: 07/18/16  3:59 PM  Result Value Ref Range Status   Specimen Description BLOOD LEFT FOREARM  Final   Special Requests BOTTLES DRAWN AEROBIC AND ANAEROBIC 5CC  Final   Culture  Setup Time   Final    GRAM NEGATIVE RODS IN BOTH AEROBIC AND ANAEROBIC BOTTLES CRITICAL RESULT CALLED TO, READ BACK BY AND VERIFIED WITH: Christella Hartigan PHARMD, AT [id number] 07/19/16 BY D. VANHOOK Performed at [place] (A)  Final   Report Status 07/21/2016 FINAL  Final   Organism ID, Bacteria ESCHERICHIA COLI  Final      Susceptibility   Escherichia coli - MIC*    AMPICILLIN 8 SENSITIVE Sensitive     CEFAZOLIN <=4 SENSITIVE Sensitive     CEFEPIME <=1 SENSITIVE Sensitive     CEFTAZIDIME <=1 SENSITIVE Sensitive     CEFTRIAXONE <=1 SENSITIVE Sensitive     CIPROFLOXACIN <=0.25 SENSITIVE Sensitive     GENTAMICIN <=1 SENSITIVE Sensitive     IMIPENEM <=0.25 SENSITIVE Sensitive     TRIMETH/SULFA <=20 SENSITIVE Sensitive     AMPICILLIN/SULBACTAM  4 SENSITIVE Sensitive     PIP/TAZO <=4 SENSITIVE Sensitive     Extended ESBL NEGATIVE Sensitive     * ESCHERICHIA COLI  Blood Culture ID Panel (Reflexed)     Status: Abnormal   Collection Time: 07/18/16  3:59 PM  Result Value Ref Range Status   Enterococcus species NOT DETECTED NOT DETECTED Final   Listeria monocytogenes NOT DETECTED NOT DETECTED Final   Staphylococcus species NOT DETECTED NOT DETECTED Final   Staphylococcus aureus NOT DETECTED NOT DETECTED Final   Streptococcus species NOT DETECTED NOT DETECTED Final   Streptococcus agalactiae NOT DETECTED NOT DETECTED Final   Streptococcus pneumoniae NOT DETECTED NOT DETECTED Final   Streptococcus pyogenes NOT DETECTED NOT DETECTED Final   Acinetobacter baumannii NOT DETECTED NOT DETECTED Final   Enterobacteriaceae species DETECTED (A) NOT DETECTED Final    Comment: CRITICAL RESULT CALLED TO, READ BACK BY AND VERIFIED WITH: C. SUMME PHARMD, AT [id number] 07/19/16 BY D. VANHOOK    Enterobacter cloacae complex NOT DETECTED NOT DETECTED Final   Escherichia coli DETECTED (A) NOT DETECTED Final    Comment: CRITICAL RESULT CALLED TO, READ BACK BY AND VERIFIED WITH: C. SUMME PHARMD, AT [id number] 07/19/16 BY D. VANHOOK    Klebsiella oxytoca NOT DETECTED NOT DETECTED Final   Klebsiella pneumoniae NOT DETECTED NOT DETECTED Final   Proteus species NOT DETECTED NOT DETECTED Final   Serratia marcescens NOT DETECTED NOT DETECTED Final   Carbapenem resistance NOT DETECTED NOT DETECTED Final   Haemophilus influenzae NOT DETECTED NOT DETECTED Final   Neisseria meningitidis NOT DETECTED NOT DETECTED Final   Pseudomonas aeruginosa NOT DETECTED NOT DETECTED Final   Candida albicans NOT DETECTED NOT DETECTED Final   Candida glabrata NOT DETECTED NOT DETECTED Final   Candida krusei NOT DETECTED NOT DETECTED Final   Candida parapsilosis NOT DETECTED NOT DETECTED Final   Candida tropicalis NOT DETECTED NOT DETECTED Final    Comment: Performed at [facility]  Urine culture     Status: None   Collection Time: 07/18/16  4:00 PM  Result Value Ref Range Status   Specimen Description URINE, CLEAN CATCH  Final   Special Requests NONE  Final   Culture NO GROWTH Performed at [facility]  Final   Report Status 07/20/2016 FINAL  Final  Blood Culture (routine x 2)     Status: Abnormal   Collection Time: 07/18/16  4:05 PM  Result Value Ref Range Status   Specimen Description RIGHT ANTECUBITAL  Final   Special Requests BOTTLES DRAWN AEROBIC AND ANAEROBIC 5CC  Final   Culture  Setup Time   Final    GRAM NEGATIVE RODS IN BOTH AEROBIC AND ANAEROBIC BOTTLES CRITICAL RESULT CALLED TO, READ BACK BY AND VERIFIED WITH: C. SUMME PHARMD, AT [id number] 07/19/16 BY D. VANHOOK    Culture (A)  Final    ESCHERICHIA COLI SUSCEPTIBILITIES PERFORMED ON PREVIOUS CULTURE WITHIN THE LAST 5 DAYS. Performed at [facility]    Report Status 07/21/2016 FINAL  Final  MRSA PCR Screening     Status: Abnormal   Collection Time: 07/19/16  5:57 AM  Result Value Ref Range Status   MRSA by PCR POSITIVE (A) NEGATIVE Final    Comment:        The GeneXpert MRSA Assay (FDA approved for NASAL specimens only), is one component of a comprehensive MRSA colonization surveillance program. It is not intended to diagnose MRSA infection nor to guide or monitor treatment for MRSA infections. RESULT CALLED TO, READ BACK BY AND VERIFIED WITH: [facility] RN AT 1919 ON 07/19/16 BY S.VANHOORNE   Culture, blood (Routine X 2) w Reflex to ID Panel     Status: None (Preliminary result)   Collection Time: 07/20/16 10:52 AM  Result Value Ref Range Status   Specimen Description BLOOD LEFT ARM  Final   Special Requests IN PEDIATRIC BOTTLE 4 CC  Final   Culture   Final    NO GROWTH < 24 HOURS Performed at [facility]    Report Status PENDING  Incomplete  Culture, blood (Routine X 2) w Reflex to ID Panel     Status: None (Preliminary result)   Collection Time:  07/20/16 10:52 AM  Result Value Ref Range Status   Specimen Description BLOOD LEFT HAND  Final   Special Requests IN PEDIATRIC BOTTLE 2 CC  Final   Culture   Final    NO GROWTH < 24 HOURS Performed at [facility]    Report Status PENDING  Incomplete    Radiology Reports Ct Abdomen Pelvis W Contrast  Result Date: 07/18/2016 CLINICAL DATA:  Sepsis, transaminitis. Fever with increased lethargy. EXAM: CT ABDOMEN AND PELVIS WITH CONTRAST TECHNIQUE: Multidetector CT imaging of the abdomen and pelvis was performed using the standard protocol following bolus administration of intravenous contrast. CONTRAST:  71mL ISOVUE-300 IOPAMIDOL (ISOVUE-300) INJECTION 61% COMPARISON:  None. FINDINGS: Lower chest: Multi chamber cardiomegaly. No pleural fluid or focal airspace disease. Tiny subpleural nodularity in the periphery of the left lower lobe without suspicious characteristics. Atherosclerosis of the descending thoracic aorta. Coronary artery calcifications are seen. Hepatobiliary: Postcholecystectomy. Prominent common bile duct measuring 13 mm at the porta hepatis, mild central intrahepatic biliary ductal dilatation. No calcified choledocholithiasis. Questionable common bile duct wall enhancement, an equivocal finding. No focal hepatic lesion. Pancreas: Fatty atrophy. No ductal dilatation or surrounding inflammation. Spleen:  Normal in size and density.  Probable punctate granuloma. Adrenals/Urinary Tract: No adrenal nodule. Bilateral renal cortical thinning without hydronephrosis. Motion artifact limits assessment for perinephric edema. No focal renal lesion. The ureters are decompressed. No excretion on delayed phase imaging consistent with renal dysfunction. Urinary bladder is decompressed and not well evaluated secondary to streak artifact from adjacent left hip prosthesis. Stomach/Bowel: The stomach  is decompressed. No small bowel dilatation or inflammation. The appendix is not visualized, reportedly  surgically absent. Small volume of colonic stool. There is diverticulosis involving the descending and sigmoid colon. Minimal soft tissue stranding in the deep right pelvis is adjacent to the sigmoid colon. Stranding from left hip prosthesis partially obscures detailed evaluation. No extraluminal air or abscess. Vascular/Lymphatic: Advanced atherosclerosis of the abdominal aorta and its branches. Calcified plaque leads to luminal narrowing of the distal abdominal aorta just proximal to the iliac bifurcation. Atherosclerotic calcifications of the intra-abdominal vasculature. No definite intra-abdominal or pelvic adenopathy. Small retroperitoneal lymph nodes are not enlarged by size criteria. Reproductive: The uterus is surgically absent. Neither ovary is confidently identified. Other: No ascites or free air. Tiny fat containing umbilical hernia. Musculoskeletal: Mild T12 superior endplate compression fracture is chronic based on chest radiograph 06/18/2013. The bones are under mineralized. Multilevel degenerative change throughout the lumbar spine. Left hip prosthesis in place. No acute osseous abnormality is seen. IMPRESSION: 1. Mild biliary prominence postcholecystectomy. This is a common postsurgical finding. There is equivocal biliary enhancement, which may be normal for this patient or represent ascending cholangitis. No calcified choledocholithiasis. 2. Distal descending and sigmoid colonic diverticulosis. Mild stranding in the deep right pelvis may be secondary to mild uncomplicated acute diverticulitis, however postsurgical scarring given prior hysterectomy could have a similar appearance. No abscess or perforation. 3. Advanced atherosclerosis. 4. Thinning of the renal parenchyma and absence of renal excretion on delayed phase imaging is consistent with chronic renal disease. 5. Osteopenia/osteoporosis.  Chronic T12 compression fracture. Electronically Signed   By: Jeb Levering M.D.   On: 07/18/2016 18:39    Mr Abdomen Mrcp Wo Contrast  Result Date: 07/21/2016 CLINICAL DATA:  Elevated liver function tests. Sepsis. Fever. Increased lethargy. COPD. CHF. EXAM: MRI ABDOMEN WITHOUT CONTRAST  (INCLUDING MRCP) TECHNIQUE: Multiplanar multisequence MR imaging of the abdomen was performed. Heavily T2-weighted images of the biliary and pancreatic ducts were obtained, and three-dimensional MRCP images were rendered by post processing. COMPARISON:  07/18/2016 abdominal CT. FINDINGS: Portions of exam are mildly motion degraded. Lower chest: Development of bilateral pleural effusions. Mild cardiomegaly. Hepatobiliary: No focal liver lesion. Status post cholecystectomy. No intrahepatic duct dilatation. The common duct measures 9 mm in the porta hepatis and 12 mm just above the pancreatic head, including on image 62/ series 4. Upper normal after cholecystectomy on the order of 10-11 mm. No choledocholithiasis. Duct tapers gradually just above the ampulla. No obstructive mass. Pancreas: Pancreatic atrophy which may be within normal variation in this age group. No main pancreatic duct dilatation. Minimal side branch duct ectasia is identified within the pancreatic body, including on image 26/series 3 and image 84/series 4. No definite acute pancreatitis. Spleen:  Normal in size, without focal abnormality. Adrenals/Urinary Tract: Normal adrenal glands. Normal kidneys, without hydronephrosis. Stomach/Bowel: Grossly normal stomach and abdominal bowel loops. Vascular/Lymphatic: Normal caliber of the aorta and branch vessels. No retroperitoneal or retrocrural adenopathy. Other:  Small volume ascites about the tip of the liver.  Anasarca. Musculoskeletal: No acute osseous abnormality. IMPRESSION: 1. Mildly motion degraded exam. 2. Cholecystectomy. Minimal common duct dilatation, without choledocholithiasis or obstructive mass. 3. Minimal side branch duct ectasia within the pancreatic body. This could represent the sequelae of prior  pancreatitis. 4. Development of bilateral pleural effusions and small volume ascites. Combined with anasarca, this suggests fluid overload/ congestive heart failure. Electronically Signed   By: Abigail Miyamoto M.D.   On: 07/21/2016 12:56   Mr 3d Recon At Scanner  Result Date: 07/21/2016 CLINICAL DATA:  Elevated liver function tests. Sepsis. Fever. Increased lethargy. COPD. CHF. EXAM: MRI ABDOMEN WITHOUT CONTRAST  (INCLUDING MRCP) TECHNIQUE: Multiplanar multisequence MR imaging of the abdomen was performed. Heavily T2-weighted images of the biliary and pancreatic ducts were obtained, and three-dimensional MRCP images were rendered by post processing. COMPARISON:  07/18/2016 abdominal CT. FINDINGS: Portions of exam are mildly motion degraded. Lower chest: Development of bilateral pleural effusions. Mild cardiomegaly. Hepatobiliary: No focal liver lesion. Status post cholecystectomy. No intrahepatic duct dilatation. The common duct measures 9 mm in the porta hepatis and 12 mm just above the pancreatic head, including on image 62/ series 4. Upper normal after cholecystectomy on the order of 10-11 mm. No choledocholithiasis. Duct tapers gradually just above the ampulla. No obstructive mass. Pancreas: Pancreatic atrophy which may be within normal variation in this age group. No main pancreatic duct dilatation. Minimal side branch duct ectasia is identified within the pancreatic body, including on image 26/series 3 and image 84/series 4. No definite acute pancreatitis. Spleen:  Normal in size, without focal abnormality. Adrenals/Urinary Tract: Normal adrenal glands. Normal kidneys, without hydronephrosis. Stomach/Bowel: Grossly normal stomach and abdominal bowel loops. Vascular/Lymphatic: Normal caliber of the aorta and branch vessels. No retroperitoneal or retrocrural adenopathy. Other:  Small volume ascites about the tip of the liver.  Anasarca. Musculoskeletal: No acute osseous abnormality. IMPRESSION: 1. Mildly motion  degraded exam. 2. Cholecystectomy. Minimal common duct dilatation, without choledocholithiasis or obstructive mass. 3. Minimal side branch duct ectasia within the pancreatic body. This could represent the sequelae of prior pancreatitis. 4. Development of bilateral pleural effusions and small volume ascites. Combined with anasarca, this suggests fluid overload/ congestive heart failure. Electronically Signed   By: Abigail Miyamoto M.D.   On: 07/21/2016 12:56   Dg Chest Port 1 View  Result Date: 07/19/2016 CLINICAL DATA:  Line placement. EXAM: PORTABLE CHEST 1 VIEW COMPARISON:  07/18/2016 . FINDINGS: Right PICC line noted with tip projected over the superior vena cava. Cardiomegaly with normal pulmonary vascularity. Mild right upper lobe subsegmental atelectasis. No pleural effusion or pneumothorax. IMPRESSION: 1.  Right PICC line noted with tip projected superior vena cava. 2. Cardiomegaly.  No pulmonary venous congestion. 3. Mild right upper lobe subsegmental atelectasis. Electronically Signed   By: Marcello Moores  Register   On: 07/19/2016 09:39   Dg Chest Port 1 View  Result Date: 07/18/2016 CLINICAL DATA:  FF sepsis, fever EXAM: PORTABLE CHEST 1 VIEW COMPARISON:  02/07/2014 FINDINGS: Cardiomegaly. No confluent airspace opacities or effusions. No acute bony abnormality. IMPRESSION: Cardiomegaly.  No acute findings. Electronically Signed   By: Rolm Baptise M.D.   On: 07/18/2016 16:59     ELGERGAWY, DAWOOD M.D on 07/22/2016 at 11:48 AM  Between 7am to 7pm - Pager - [phone]  After 7pm go to [email or web address] - password [facility]  [facility] -  Office  [phone]

## 2016-07-22 NOTE — Progress Notes (Addendum)
ANTICOAGULATION CONSULT NOTE - Initial Consult  Pharmacy Consult for Eliquis Indication: atrial fibrillation  Allergies  Allergen Reactions  . Other     Narcotics=makes her very disoriented  . Percocet [Oxycodone-Acetaminophen] Other (See Comments)    Dilerium, hallucinations  . Tramadol Anxiety  . Vicodin [Hydrocodone-Acetaminophen] Anxiety    Patient Measurements: Height: 5\' 3"  (160 cm) Weight: 155 lb 13.8 oz (70.7 kg) IBW/kg (Calculated) : 52.4  Vital Signs: Temp: 98.1 F (36.7 C) (12/09 0327) Temp Source: Oral (12/09 0327) BP: 159/59 (12/09 0327) Pulse Rate: 72 (12/09 0823)  Labs:  Recent Labs  07/20/16 0354 07/21/16 0435 07/22/16 0454  HGB 9.3*  --  8.8*  HCT 28.2*  --  26.2*  PLT 113*  --  117*  CREATININE 1.61* 1.30* 1.25*    Estimated Creatinine Clearance: 26.5 mL/min (by C-G formula based on SCr of 1.25 mg/dL (H)).   Medical History: Past Medical History:  Diagnosis Date  . Anemia   . Blood transfusion ~ 2012   "once; not related to a surgery or bleeding that I remember" (06/06/2013)  . Bronchial pneumonia    "once; several years ago" (06/06/2013)  . CHF (congestive heart failure) (HCC)    takes Lasix every other day  . Chronic atrial fibrillation (HCC)    takes Coumadin as instructed  . COPD (chronic obstructive pulmonary disease) ([place])   . Diabetes mellitus    takes Metformin and Amaryl daily  . Emphysema   . History of head injury    many yrs ago  . Hypercholesterolemia    doesn't require meds for this  . Hypertension    takes Prinizide daily  . Hypothyroidism    takes Synthroid daily  . Lung disease    BRONCHOSPASTIC  . Macular degeneration    dry  . Neuropathy (HCC)    takes Gabapentin bid  . Peripheral vascular disease ([place])   . Rheumatoid arthritis ([place])    "shoulders; daughter gives me shots of Methotrexate q week" (06/06/2013)  . Vaginal discharge    has seen GYN but says its nothing to worry about    Medications:   Prescriptions Prior to Admission  Medication Sig Dispense Refill Last Dose  . acetaminophen (TYLENOL) 650 MG suppository Place 650 mg rectally every 4 (four) hours as needed for mild pain or moderate pain.   07/18/2016 at 1400  . apixaban (ELIQUIS) 2.5 MG TABS tablet Take 2.5 mg by mouth 2 (two) times daily.   07/18/2016 at 0800  . diclofenac sodium (VOLTAREN) 1 % GEL Apply 2 g topically 4 (four) times daily.   07/18/2016 at 1200  . diltiazem (CARDIZEM) 60 MG tablet Take 60 mg by mouth 2 (two) times daily.   07/18/2016 at 0800  . folic acid (FOLVITE) 1 MG tablet Take 1 mg by mouth daily.   07/18/2016 at 0800  . furosemide (LASIX) 20 MG tablet Take 40 mg by mouth daily.   07/18/2016 at 0800  . gabapentin (NEURONTIN) 300 MG capsule Take 300 mg by mouth 2 (two) times daily.   07/18/2016 at 0800  . insulin lispro (HUMALOG) 100 UNIT/ML KiwkPen Inject 2-8 Units into the skin 2 (two) times daily. Glucose:  0-100= 0 units 101-150= 2 units 151-200- 4 units 201-250= 6 units > 250= 8 units   07/18/2016 at 0630  . levothyroxine (SYNTHROID, LEVOTHROID) 50 MCG tablet Take 50 mcg by mouth daily with breakfast.    07/18/2016 at 0530  . loperamide (IMODIUM) 2 MG capsule Take 2 mg  by mouth daily as needed for diarrhea or loose stools.   0 unk  . metFORMIN (GLUCOPHAGE) 500 MG tablet Take 500 mg by mouth daily.  5 07/18/2016 at 0800  . methotrexate 50 MG/2ML injection Inject 0.4 mLs into the muscle once a week.  3 07/18/2016  . Multiple Vitamin (MULITIVITAMIN WITH MINERALS) TABS Take 1 tablet by mouth daily.   07/18/2016 at 0800  . Multiple Vitamins-Minerals (ICAPS PO) Take 1 capsule by mouth daily.    07/18/2016 at 0800  . mupirocin ointment (BACTROBAN) 2 % Apply 1 application topically daily as needed (wound care).   97 unk  . nitroGLYCERIN (NITRODUR - DOSED IN MG/24 HR) 0.2 mg/hr patch Place 1 patch onto the skin daily.  10 07/18/2016 at 0800  . potassium chloride SA (K-DUR,KLOR-CON) 20 MEQ tablet Take 20 mEq by mouth 2  (two) times daily.   07/18/2016 at 0800  . tiotropium (SPIRIVA) 18 MCG inhalation capsule Place 18 mcg into inhaler and inhale daily with breakfast.    07/18/2016 at 0800  . vitamin B-12 (CYANOCOBALAMIN) 500 MCG tablet Take 500 mcg by mouth daily.   07/18/2016 at 0800  . vitamin C (ASCORBIC ACID) 500 MG tablet Take 500 mg by mouth daily.   07/18/2016 at 0800  . pantoprazole (PROTONIX) 40 MG tablet Take 1 tablet (40 mg total) by mouth daily. (Patient not taking: Reported on 07/18/2016) 30 tablet 11 Not Taking at Unknown time   Scheduled:  . acetaminophen  650 mg Oral Once  . acidophilus  2 capsule Oral Daily  . apixaban  2.5 mg Oral BID  . cefTRIAXone (ROCEPHIN)  IV  2 g Intravenous Q24H  . Chlorhexidine Gluconate Cloth  6 each Topical Q0600  . cyanocobalamin  500 mcg Oral Daily  . diclofenac sodium  2 g Topical QID  . diltiazem  60 mg Oral BID  . folic acid  1 mg Oral Daily  . gabapentin  300 mg Oral BID  . insulin aspart  2-8 Units Subcutaneous BID WC  . levothyroxine  50 mcg Oral QAC breakfast  . mupirocin ointment  1 application Nasal BID  . nitroGLYCERIN  0.2 mg Transdermal Daily  . pantoprazole (PROTONIX) IV  40 mg Intravenous Q12H  . tiotropium  18 mcg Inhalation Q breakfast   Infusions:   PRN: haloperidol lactate, levalbuterol, loperamide, mupirocin ointment, ondansetron **OR** ondansetron (ZOFRAN) IV, sodium chloride flush  Assessment: 89 yoF on Eliquis PTA for AFib, admitted 12/5 with sepsis d/t cholangitis and E coli bacteremia. Eliquis held for possible GI intervention and currently on SQ heparin, last dose ~ 0600 this AM  Today, 07/22/2016:  No plans for ERCP; per GI can resume Eliquis  Hgb down since admission, Plt also low but improving  No bleeding noted  Goal of Therapy:  Prevention of thromboembolism   Plan:   Stop SQ heparin  Resume Eliquis 2.5 mg PO bid (patient qualifies for full dose, but agree with reduced dosing given advanced age and borderline renal  function)  Monitor for signs of bleeding  CBC at least q72 hr and SCr at least weekly while on Eliquis inpatient  Pharmacy will sign off as no additional adjustments anticipated, but will follow CrCl and CBC peripherally  Reuel Boom, PharmD, BCPS Pager: [phone] 07/22/2016, 12:20 PM

## 2016-07-23 LAB — GLUCOSE, CAPILLARY
GLUCOSE-CAPILLARY: 120 mg/dL — AB (ref 65–99)
GLUCOSE-CAPILLARY: 177 mg/dL — AB (ref 65–99)
GLUCOSE-CAPILLARY: 158 mg/dL — AB (ref 65–99)
GLUCOSE-CAPILLARY: 134 mg/dL — AB (ref 65–99)

## 2016-07-23 MED ORDER — FUROSEMIDE 40 MG PO TABS
40.0000 mg | ORAL_TABLET | Freq: Every day | ORAL | Status: DC
  Administered 2016-07-23 – 2016-07-24 (×2): 40 mg via ORAL
  Filled 2016-07-23 (×2): qty 1

## 2016-07-23 MED ORDER — PANTOPRAZOLE SODIUM 40 MG PO TBEC
40.0000 mg | DELAYED_RELEASE_TABLET | Freq: Two times a day (BID) | ORAL | Status: DC
  Administered 2016-07-23 – 2016-07-24 (×3): 40 mg via ORAL
  Filled 2016-07-23 (×3): qty 1

## 2016-07-23 NOTE — Progress Notes (Signed)
Pharmacy IV to PO conversion  The patient is receiving Pantoprazole by the intravenous route.  Based on criteria approved by the Pharmacy and [employer], the medication is being converted to the equivalent oral dose form.   No active GI bleeding or impaired absorption  Not s/p esophagectomy  Documented ability to take oral medications for > 24 hr  Plan to continue treatment for at least 1 day  If you have any questions about this conversion, please contact the Pharmacy Department (ext [phone]).  Thank you.  Reuel Boom, PharmD Pager: [phone] 07/23/2016, 10:09 AM

## 2016-07-23 NOTE — Progress Notes (Signed)
PROGRESS NOTE                                                                                                                                                                                                             Patient Demographics:    Sydney [name], is a 82 y.o. female, DOB - 1920/09/16, [id number]  Admit date - 07/18/2016   Admitting Physician Norval Morton, MD  Outpatient Primary MD for the patient is Geoffery Lyons, MD  LOS - 5   Chief Complaint  Patient presents with  . Code Sepsis       Brief Narrative   82 y.o. female with medical history significant of  Afib on Eliquis, HTN, HLD, DM type II, Hyopothyroidism, left BKA, COPD, anemia; who presented with reports of confusion and fever, workup significant for elevated LFTs, with imaging evidence of possible cholangitis, seen by GI , MRCP with no acute findings, no plan for ERCP.   Subjective:    [name] todayDenies any complaints, reports good appetite, no nausea vomiting or abdominal pain.   Assessment  & Plan :    Principal Problem:   Sepsis due to undetermined organism with metabolic encephalopathy ([place]) Active Problems:   Hypothyroidism   Diabetes mellitus type 2, controlled ([place])   Essential hypertension   Atrial fibrillation (H[facility])   Transaminitis   Hyperbilirubinemia  Sepsis secondary to choledocholithiasis versus cholangitis/ E coli bacteremia - GI input greatly appreciated, Continue with IV fluids , IV antibiotics, MRCP 12/8 negative, so per GI no plan for ERCP at this time . - Initially on IV Zosyn, transitioned to IV Rocephin giving sensitivities , and can be discharged on amoxicillin to finish total of 14 days on discharge  - LFTs stable/improving  - No plan for further procedures, so resumed on Eliquis  Elevated LFTs  - see above discussion  Chronic atrial fibrillation:  - Currently rate controlled on diltiazem  - Resumed on  Eliquis  Thrombocytopenia - Most likely related to sepsis  AKI on Chronic kidney disease stage III:  - Patient's previous baseline noted to be around 1.28 over 2 years ago. Creatinine peaked at 1.79, improving with IV fluids, avoid nephrotoxic medication - Follow-up kidney function in a.m.   Diabetes mellitus type 2: - Patient on metformin and sliding scale insulin at home.  - Hypoglycemic protocol -  Hold metformin  - Continue home insulin sliding scale regimen with CBGs twice daily - Continue gabapentin  Essential hypertension - will need to restart Lasix and potassium chloride when medically appropriate  COPD  - Continuous pulse oximetry overnight with nasal cannula oxygen to keep O2 sats greater than 92% - Continue Spiriva - Xopenex neb prn SOB/Wheezing  Anemia:  - Chronic. Hemoglobin 11 which appears near patient's baseline. - Continue to monitor  History of CHF:  - last echo back in 06/2015 - Strict I&O's   - Monitoring overnight for signs of fluid overload and will give IV Lasix if needed  Hypothyroidism - Continue levothyroxine  GERD - protonix    Code Status : Full  Family Communication  : D/W daughter at bedside  Disposition Plan  : back to ALF in am.  Consults  :  GI  Procedures  : None  DVT Prophylaxis  :  On Eliquis   Lab Results  Component Value Date   PLT 117 (L) 07/22/2016    Antibiotics  :    Anti-infectives    Start     Dose/Rate Route Frequency Ordered Stop   07/21/16 1400  piperacillin-tazobactam (ZOSYN) IVPB 3.375 g  Status:  Discontinued     3.375 g 12.5 mL/hr over 240 Minutes Intravenous Every 8 hours 07/21/16 0841 07/21/16 1242   07/21/16 1400  cefTRIAXone (ROCEPHIN) 2 g in dextrose 5 % 50 mL IVPB     2 g 100 mL/hr over 30 Minutes Intravenous Every 24 hours 07/21/16 1242     07/19/16 2200  piperacillin-tazobactam (ZOSYN) IVPB 2.25 g  Status:  Discontinued     2.25 g 100 mL/hr over 30 Minutes Intravenous Every 8 hours  07/19/16 1351 07/21/16 0841   07/19/16 1600  vancomycin (VANCOCIN) IVPB 750 mg/150 ml premix  Status:  Discontinued     750 mg 150 mL/hr over 60 Minutes Intravenous Every 24 hours 07/18/16 2034 07/19/16 1356   07/19/16 1400  piperacillin-tazobactam (ZOSYN) IVPB 2.25 g     2.25 g 100 mL/hr over 30 Minutes Intravenous STAT 07/19/16 1351 07/19/16 1527   07/19/16 0000  piperacillin-tazobactam (ZOSYN) IVPB 3.375 g  Status:  Discontinued     3.375 g 12.5 mL/hr over 240 Minutes Intravenous Every 8 hours 07/18/16 2034 07/19/16 1351   07/18/16 1615  piperacillin-tazobactam (ZOSYN) IVPB 3.375 g     3.375 g 100 mL/hr over 30 Minutes Intravenous  Once 07/18/16 1600 07/18/16 1701   07/18/16 1615  vancomycin (VANCOCIN) IVPB 1000 mg/200 mL premix     1,000 mg 200 mL/hr over 60 Minutes Intravenous  Once 07/18/16 1600 07/18/16 1900        Objective:   Vitals:   07/22/16 0823 07/22/16 1501 07/22/16 1523 07/22/16 2150  BP:  (!) 168/124 (!) 144/56 (!) 176/82  Pulse: 72 81 65 86  Resp: 16 17  18   Temp:  98.6 F (37 C)  97.8 F (36.6 C)  TempSrc:  Oral  Oral  SpO2: 98% 97%  93%  Weight:      Height:        Wt Readings from Last 3 Encounters:  07/18/16 70.7 kg (155 lb 13.8 oz)  07/16/15 67.1 kg (148 lb)  01/01/15 62.1 kg (137 lb)     Intake/Output Summary (Last 24 hours) at 07/23/16 1259 Last data filed at 07/22/16 1509  Gross per 24 hour  Intake              220 ml  Output                0 ml  Net              220 ml     Physical Exam  Awake Alert,Confused, frail Supple Neck,No JVD Symmetrical Chest wall movement, Good air movement bilaterally, CTAB RRR,No Gallops,Rubs or new Murmurs, No Parasternal Heave +ve B.Sounds, Abd Soft, No tenderness, No rebound - guarding or rigidity. No Cyanosis, left BKA, right transtibial amputation    Data Review:    CBC  Recent Labs Lab 07/18/16 1602 07/19/16 0343 07/20/16 0354 07/22/16 0454  WBC 8.7 12.0* 8.6 4.5  HGB 11.0* 10.0*  9.3* 8.8*  HCT 33.6* 32.1* 28.2* 26.2*  PLT 141* 99* 113* 117*  MCV 97.1 102.9* 97.6 96.3  MCH 31.8 32.1 32.2 32.4  MCHC 32.7 31.2 33.0 33.6  RDW 15.6* 16.2* 15.9* 15.8*  LYMPHSABS 0.2*  --   --   --   MONOABS 0.6  --   --   --   EOSABS 0.0  --   --   --   BASOSABS 0.0  --   --   --     Chemistries   Recent Labs Lab 07/18/16 1602 07/19/16 0343 07/20/16 0354 07/21/16 0435 07/22/16 0454  NA 141 141 140 140 139  K 4.1 4.4 3.6 3.0* 4.1  CL 104 110 109 111 108  CO2 26 19* 22 23 22   GLUCOSE 169* 158* 98 134* 145*  BUN 20 22* 29* 26* 22*  CREATININE 1.40* 1.79* 1.61* 1.30* 1.25*  CALCIUM 9.3 7.6* 7.6* 7.3* 7.9*  MG  --   --   --  1.6*  --   AST 1,047* 666* 277* 123* 72*  ALT 493* 522* 300* 183* 141*  ALKPHOS 296* 190* 152* 143* 162*  BILITOT 2.7* 3.2* 3.4* 4.1* 3.6*   ------------------------------------------------------------------------------------------------------------------ No results for input(s): CHOL, HDL, LDLCALC, TRIG, CHOLHDL, LDLDIRECT in the last 72 hours.  Lab Results  Component Value Date   HGBA1C 7.4 (H) 11/10/2013   ------------------------------------------------------------------------------------------------------------------ No results for input(s): TSH, T4TOTAL, T3FREE, THYROIDAB in the last 72 hours.  Invalid input(s): FREET3 ------------------------------------------------------------------------------------------------------------------ No results for input(s): VITAMINB12, FOLATE, FERRITIN, TIBC, IRON, RETICCTPCT in the last 72 hours.  Coagulation profile  Recent Labs Lab 07/18/16 1602  INR 1.44    No results for input(s): DDIMER in the last 72 hours.  Cardiac Enzymes  Recent Labs Lab 07/18/16 2222 07/19/16 0351  TROPONINI 0.03* <0.03   ------------------------------------------------------------------------------------------------------------------ No results found for: BNP  Inpatient Medications  Scheduled Meds: .  acetaminophen  650 mg Oral Once  . acidophilus  2 capsule Oral Daily  . apixaban  2.5 mg Oral BID  . cefTRIAXone (ROCEPHIN)  IV  2 g Intravenous Q24H  . Chlorhexidine Gluconate Cloth  6 each Topical Q0600  . cyanocobalamin  500 mcg Oral Daily  . diclofenac sodium  2 g Topical QID  . diltiazem  60 mg Oral BID  . folic acid  1 mg Oral Daily  . furosemide  40 mg Oral Daily  . gabapentin  300 mg Oral BID  . insulin aspart  2-8 Units Subcutaneous BID WC  . levothyroxine  50 mcg Oral QAC breakfast  . mupirocin ointment  1 application Nasal BID  . nitroGLYCERIN  0.2 mg Transdermal Daily  . pantoprazole  40 mg Oral BID  . tiotropium  18 mcg Inhalation Q breakfast   Continuous Infusions:  PRN Meds:.haloperidol lactate, levalbuterol, loperamide, mupirocin  ointment, ondansetron **OR** ondansetron (ZOFRAN) IV, sodium chloride flush  Micro Results Recent Results (from the past 240 hour(s))  Blood Culture (routine x 2)     Status: Abnormal   Collection Time: 07/18/16  3:59 PM  Result Value Ref Range Status   Specimen Description BLOOD LEFT FOREARM  Final   Special Requests BOTTLES DRAWN AEROBIC AND ANAEROBIC 5CC  Final   Culture  Setup Time   Final    GRAM NEGATIVE RODS IN BOTH AEROBIC AND ANAEROBIC BOTTLES CRITICAL RESULT CALLED TO, READ BACK BY AND VERIFIED WITH: Christella Hartigan PHARMD, AT [id number] 07/19/16 BY D. VANHOOK Performed at [facility] (A)  Final   Report Status 07/21/2016 FINAL  Final   Organism ID, Bacteria ESCHERICHIA COLI  Final      Susceptibility   Escherichia coli - MIC*    AMPICILLIN 8 SENSITIVE Sensitive     CEFAZOLIN <=4 SENSITIVE Sensitive     CEFEPIME <=1 SENSITIVE Sensitive     CEFTAZIDIME <=1 SENSITIVE Sensitive     CEFTRIAXONE <=1 SENSITIVE Sensitive     CIPROFLOXACIN <=0.25 SENSITIVE Sensitive     GENTAMICIN <=1 SENSITIVE Sensitive     IMIPENEM <=0.25 SENSITIVE Sensitive     TRIMETH/SULFA <=20 SENSITIVE Sensitive      AMPICILLIN/SULBACTAM 4 SENSITIVE Sensitive     PIP/TAZO <=4 SENSITIVE Sensitive     Extended ESBL NEGATIVE Sensitive     * ESCHERICHIA COLI  Blood Culture ID Panel (Reflexed)     Status: Abnormal   Collection Time: 07/18/16  3:59 PM  Result Value Ref Range Status   Enterococcus species NOT DETECTED NOT DETECTED Final   Listeria monocytogenes NOT DETECTED NOT DETECTED Final   Staphylococcus species NOT DETECTED NOT DETECTED Final   Staphylococcus aureus NOT DETECTED NOT DETECTED Final   Streptococcus species NOT DETECTED NOT DETECTED Final   Streptococcus agalactiae NOT DETECTED NOT DETECTED Final   Streptococcus pneumoniae NOT DETECTED NOT DETECTED Final   Streptococcus pyogenes NOT DETECTED NOT DETECTED Final   Acinetobacter baumannii NOT DETECTED NOT DETECTED Final   Enterobacteriaceae species DETECTED (A) NOT DETECTED Final    Comment: CRITICAL RESULT CALLED TO, READ BACK BY AND VERIFIED WITH: C. SUMME PHARMD, AT [id number] 07/19/16 BY D. VANHOOK    Enterobacter cloacae complex NOT DETECTED NOT DETECTED Final   Escherichia coli DETECTED (A) NOT DETECTED Final    Comment: CRITICAL RESULT CALLED TO, READ BACK BY AND VERIFIED WITH: C. SUMME PHARMD, AT [id number] 07/19/16 BY D. VANHOOK    Klebsiella oxytoca NOT DETECTED NOT DETECTED Final   Klebsiella pneumoniae NOT DETECTED NOT DETECTED Final   Proteus species NOT DETECTED NOT DETECTED Final   Serratia marcescens NOT DETECTED NOT DETECTED Final   Carbapenem resistance NOT DETECTED NOT DETECTED Final   Haemophilus influenzae NOT DETECTED NOT DETECTED Final   Neisseria meningitidis NOT DETECTED NOT DETECTED Final   Pseudomonas aeruginosa NOT DETECTED NOT DETECTED Final   Candida albicans NOT DETECTED NOT DETECTED Final   Candida glabrata NOT DETECTED NOT DETECTED Final   Candida krusei NOT DETECTED NOT DETECTED Final   Candida parapsilosis NOT DETECTED NOT DETECTED Final   Candida tropicalis NOT DETECTED NOT DETECTED Final    Comment: Performed  at [facility]  Urine culture     Status: None   Collection Time: 07/18/16  4:00 PM  Result Value Ref Range Status   Specimen Description URINE, CLEAN CATCH  Final   Special Requests NONE  Final   Culture NO GROWTH Performed at [facility]   Final   Report Status 07/20/2016 FINAL  Final  Blood Culture (routine x 2)     Status: Abnormal   Collection Time: 07/18/16  4:05 PM  Result Value Ref Range Status   Specimen Description RIGHT ANTECUBITAL  Final   Special Requests BOTTLES DRAWN AEROBIC AND ANAEROBIC 5CC  Final   Culture  Setup Time   Final    GRAM NEGATIVE RODS IN BOTH AEROBIC AND ANAEROBIC BOTTLES CRITICAL RESULT CALLED TO, READ BACK BY AND VERIFIED WITH: C. SUMME PHARMD, AT [id number] 07/19/16 BY D. VANHOOK    Culture (A)  Final    ESCHERICHIA COLI SUSCEPTIBILITIES PERFORMED ON PREVIOUS CULTURE WITHIN THE LAST 5 DAYS. Performed at [facility]    Report Status 07/21/2016 FINAL  Final  MRSA PCR Screening     Status: Abnormal   Collection Time: 07/19/16  5:57 AM  Result Value Ref Range Status   MRSA by PCR POSITIVE (A) NEGATIVE Final    Comment:        The GeneXpert MRSA Assay (FDA approved for NASAL specimens only), is one component of a comprehensive MRSA colonization surveillance program. It is not intended to diagnose MRSA infection nor to guide or monitor treatment for MRSA infections. RESULT CALLED TO, READ BACK BY AND VERIFIED WITH: A Rosie Place RN AT 1919 ON 07/19/16 BY S.VANHOORNE   Culture, blood (Routine X 2) w Reflex to ID Panel     Status: None (Preliminary result)   Collection Time: 07/20/16 10:52 AM  Result Value Ref Range Status   Specimen Description BLOOD LEFT ARM  Final   Special Requests IN PEDIATRIC BOTTLE 4 CC  Final   Culture   Final    NO GROWTH 2 DAYS Performed at [facility]    Report Status PENDING  Incomplete  Culture, blood (Routine X 2) w Reflex to ID Panel     Status: None (Preliminary result)   Collection  Time: 07/20/16 10:52 AM  Result Value Ref Range Status   Specimen Description BLOOD LEFT HAND  Final   Special Requests IN PEDIATRIC BOTTLE 2 CC  Final   Culture   Final    NO GROWTH 2 DAYS Performed at [facility]    Report Status PENDING  Incomplete    Radiology Reports Ct Abdomen Pelvis W Contrast  Result Date: 07/18/2016 CLINICAL DATA:  Sepsis, transaminitis. Fever with increased lethargy. EXAM: CT ABDOMEN AND PELVIS WITH CONTRAST TECHNIQUE: Multidetector CT imaging of the abdomen and pelvis was performed using the standard protocol following bolus administration of intravenous contrast. CONTRAST:  51mL ISOVUE-300 IOPAMIDOL (ISOVUE-300) INJECTION 61% COMPARISON:  None. FINDINGS: Lower chest: Multi chamber cardiomegaly. No pleural fluid or focal airspace disease. Tiny subpleural nodularity in the periphery of the left lower lobe without suspicious characteristics. Atherosclerosis of the descending thoracic aorta. Coronary artery calcifications are seen. Hepatobiliary: Postcholecystectomy. Prominent common bile duct measuring 13 mm at the porta hepatis, mild central intrahepatic biliary ductal dilatation. No calcified choledocholithiasis. Questionable common bile duct wall enhancement, an equivocal finding. No focal hepatic lesion. Pancreas: Fatty atrophy. No ductal dilatation or surrounding inflammation. Spleen:  Normal in size and density.  Probable punctate granuloma. Adrenals/Urinary Tract: No adrenal nodule. Bilateral renal cortical thinning without hydronephrosis. Motion artifact limits assessment for perinephric edema. No focal renal lesion. The ureters are decompressed. No excretion on delayed phase imaging consistent with renal dysfunction. Urinary bladder is decompressed and not well evaluated secondary  to streak artifact from adjacent left hip prosthesis. Stomach/Bowel: The stomach is decompressed. No small bowel dilatation or inflammation. The appendix is not visualized,  reportedly surgically absent. Small volume of colonic stool. There is diverticulosis involving the descending and sigmoid colon. Minimal soft tissue stranding in the deep right pelvis is adjacent to the sigmoid colon. Stranding from left hip prosthesis partially obscures detailed evaluation. No extraluminal air or abscess. Vascular/Lymphatic: Advanced atherosclerosis of the abdominal aorta and its branches. Calcified plaque leads to luminal narrowing of the distal abdominal aorta just proximal to the iliac bifurcation. Atherosclerotic calcifications of the intra-abdominal vasculature. No definite intra-abdominal or pelvic adenopathy. Small retroperitoneal lymph nodes are not enlarged by size criteria. Reproductive: The uterus is surgically absent. Neither ovary is confidently identified. Other: No ascites or free air. Tiny fat containing umbilical hernia. Musculoskeletal: Mild T12 superior endplate compression fracture is chronic based on chest radiograph 06/18/2013. The bones are under mineralized. Multilevel degenerative change throughout the lumbar spine. Left hip prosthesis in place. No acute osseous abnormality is seen. IMPRESSION: 1. Mild biliary prominence postcholecystectomy. This is a common postsurgical finding. There is equivocal biliary enhancement, which may be normal for this patient or represent ascending cholangitis. No calcified choledocholithiasis. 2. Distal descending and sigmoid colonic diverticulosis. Mild stranding in the deep right pelvis may be secondary to mild uncomplicated acute diverticulitis, however postsurgical scarring given prior hysterectomy could have a similar appearance. No abscess or perforation. 3. Advanced atherosclerosis. 4. Thinning of the renal parenchyma and absence of renal excretion on delayed phase imaging is consistent with chronic renal disease. 5. Osteopenia/osteoporosis.  Chronic T12 compression fracture. Electronically Signed   By: Jeb Levering M.D.   On:  07/18/2016 18:39   Mr Abdomen Mrcp Wo Contrast  Result Date: 07/21/2016 CLINICAL DATA:  Elevated liver function tests. Sepsis. Fever. Increased lethargy. COPD. CHF. EXAM: MRI ABDOMEN WITHOUT CONTRAST  (INCLUDING MRCP) TECHNIQUE: Multiplanar multisequence MR imaging of the abdomen was performed. Heavily T2-weighted images of the biliary and pancreatic ducts were obtained, and three-dimensional MRCP images were rendered by post processing. COMPARISON:  07/18/2016 abdominal CT. FINDINGS: Portions of exam are mildly motion degraded. Lower chest: Development of bilateral pleural effusions. Mild cardiomegaly. Hepatobiliary: No focal liver lesion. Status post cholecystectomy. No intrahepatic duct dilatation. The common duct measures 9 mm in the porta hepatis and 12 mm just above the pancreatic head, including on image 62/ series 4. Upper normal after cholecystectomy on the order of 10-11 mm. No choledocholithiasis. Duct tapers gradually just above the ampulla. No obstructive mass. Pancreas: Pancreatic atrophy which may be within normal variation in this age group. No main pancreatic duct dilatation. Minimal side branch duct ectasia is identified within the pancreatic body, including on image 26/series 3 and image 84/series 4. No definite acute pancreatitis. Spleen:  Normal in size, without focal abnormality. Adrenals/Urinary Tract: Normal adrenal glands. Normal kidneys, without hydronephrosis. Stomach/Bowel: Grossly normal stomach and abdominal bowel loops. Vascular/Lymphatic: Normal caliber of the aorta and branch vessels. No retroperitoneal or retrocrural adenopathy. Other:  Small volume ascites about the tip of the liver.  Anasarca. Musculoskeletal: No acute osseous abnormality. IMPRESSION: 1. Mildly motion degraded exam. 2. Cholecystectomy. Minimal common duct dilatation, without choledocholithiasis or obstructive mass. 3. Minimal side branch duct ectasia within the pancreatic body. This could represent the  sequelae of prior pancreatitis. 4. Development of bilateral pleural effusions and small volume ascites. Combined with anasarca, this suggests fluid overload/ congestive heart failure. Electronically Signed   By: Adria Devon.D.  On: 07/21/2016 12:56   Mr 3d Recon At Scanner  Result Date: 07/21/2016 CLINICAL DATA:  Elevated liver function tests. Sepsis. Fever. Increased lethargy. COPD. CHF. EXAM: MRI ABDOMEN WITHOUT CONTRAST  (INCLUDING MRCP) TECHNIQUE: Multiplanar multisequence MR imaging of the abdomen was performed. Heavily T2-weighted images of the biliary and pancreatic ducts were obtained, and three-dimensional MRCP images were rendered by post processing. COMPARISON:  07/18/2016 abdominal CT. FINDINGS: Portions of exam are mildly motion degraded. Lower chest: Development of bilateral pleural effusions. Mild cardiomegaly. Hepatobiliary: No focal liver lesion. Status post cholecystectomy. No intrahepatic duct dilatation. The common duct measures 9 mm in the porta hepatis and 12 mm just above the pancreatic head, including on image 62/ series 4. Upper normal after cholecystectomy on the order of 10-11 mm. No choledocholithiasis. Duct tapers gradually just above the ampulla. No obstructive mass. Pancreas: Pancreatic atrophy which may be within normal variation in this age group. No main pancreatic duct dilatation. Minimal side branch duct ectasia is identified within the pancreatic body, including on image 26/series 3 and image 84/series 4. No definite acute pancreatitis. Spleen:  Normal in size, without focal abnormality. Adrenals/Urinary Tract: Normal adrenal glands. Normal kidneys, without hydronephrosis. Stomach/Bowel: Grossly normal stomach and abdominal bowel loops. Vascular/Lymphatic: Normal caliber of the aorta and branch vessels. No retroperitoneal or retrocrural adenopathy. Other:  Small volume ascites about the tip of the liver.  Anasarca. Musculoskeletal: No acute osseous abnormality.  IMPRESSION: 1. Mildly motion degraded exam. 2. Cholecystectomy. Minimal common duct dilatation, without choledocholithiasis or obstructive mass. 3. Minimal side branch duct ectasia within the pancreatic body. This could represent the sequelae of prior pancreatitis. 4. Development of bilateral pleural effusions and small volume ascites. Combined with anasarca, this suggests fluid overload/ congestive heart failure. Electronically Signed   By: Abigail Miyamoto M.D.   On: 07/21/2016 12:56   Dg Chest Port 1 View  Result Date: 07/19/2016 CLINICAL DATA:  Line placement. EXAM: PORTABLE CHEST 1 VIEW COMPARISON:  07/18/2016 . FINDINGS: Right PICC line noted with tip projected over the superior vena cava. Cardiomegaly with normal pulmonary vascularity. Mild right upper lobe subsegmental atelectasis. No pleural effusion or pneumothorax. IMPRESSION: 1.  Right PICC line noted with tip projected superior vena cava. 2. Cardiomegaly.  No pulmonary venous congestion. 3. Mild right upper lobe subsegmental atelectasis. Electronically Signed   By: Marcello Moores  Register   On: 07/19/2016 09:39   Dg Chest Port 1 View  Result Date: 07/18/2016 CLINICAL DATA:  FF sepsis, fever EXAM: PORTABLE CHEST 1 VIEW COMPARISON:  02/07/2014 FINDINGS: Cardiomegaly. No confluent airspace opacities or effusions. No acute bony abnormality. IMPRESSION: Cardiomegaly.  No acute findings. Electronically Signed   By: Rolm Baptise M.D.   On: 07/18/2016 16:59     ELGERGAWY, DAWOOD M.D on 07/23/2016 at 12:59 PM  Between 7am to 7pm - Pager - [phone]  After 7pm go to [email or web address] - password [facility]  [facility] -  Office  [phone]

## 2016-07-24 ENCOUNTER — Ambulatory Visit: Payer: Medicare Other | Admitting: Cardiology

## 2016-07-24 LAB — GLUCOSE, CAPILLARY: Glucose-Capillary: 136 mg/dL — ABNORMAL HIGH (ref 65–99)

## 2016-07-24 LAB — COMPREHENSIVE METABOLIC PANEL
ALT: 68 U/L — ABNORMAL HIGH (ref 14–54)
ANION GAP: 9 (ref 5–15)
AST: 28 U/L (ref 15–41)
Albumin: 3.1 g/dL — ABNORMAL LOW (ref 3.5–5.0)
Alkaline Phosphatase: 145 U/L — ABNORMAL HIGH (ref 38–126)
BUN: 12 mg/dL (ref 6–20)
CO2: 27 mmol/L (ref 22–32)
Calcium: 8.2 mg/dL — ABNORMAL LOW (ref 8.9–10.3)
Chloride: 103 mmol/L (ref 101–111)
Creatinine, Ser: 1.06 mg/dL — ABNORMAL HIGH (ref 0.44–1.00)
GFR calc non Af Amer: 44 mL/min — ABNORMAL LOW (ref >60)
GFR, EST AFRICAN AMERICAN: 51 mL/min — AB (ref >60)
Glucose, Bld: 128 mg/dL — ABNORMAL HIGH (ref 65–99)
POTASSIUM: 3.1 mmol/L — AB (ref 3.5–5.1)
SODIUM: 139 mmol/L (ref 135–145)
Total Bilirubin: 1.5 mg/dL — ABNORMAL HIGH (ref 0.3–1.2)
Total Protein: 5.6 g/dL — ABNORMAL LOW (ref 6.5–8.1)

## 2016-07-24 MED ORDER — RISAQUAD PO CAPS: 2.0000 | ORAL_CAPSULE | Freq: Every day | ORAL | 0 refills | Status: AC

## 2016-07-24 MED ORDER — POTASSIUM CHLORIDE CRYS ER 20 MEQ PO TBCR
30.0000 meq | EXTENDED_RELEASE_TABLET | ORAL | Status: DC
  Administered 2016-07-24: 30 meq via ORAL
  Filled 2016-07-24: qty 1

## 2016-07-24 MED ORDER — AMOXICILLIN 500 MG PO CAPS: 500.0000 mg | ORAL_CAPSULE | Freq: Three times a day (TID) | ORAL | 0 refills | Status: AC

## 2016-07-24 NOTE — Progress Notes (Signed)
D/C to [facility] nursing [facility]F, in stable condition, daughter at the bedside to transport patient. Pt transferred to wheelchair without issues. Instructions reviewed with daughter and patient. SRP, RN

## 2016-07-24 NOTE — Discharge Instructions (Signed)
Follow with Primary MD ARONSON,RICHARD A, MD in 7 days   Get CBC, CMP,  checked  by Primary MD next visit.    Disposition ALF   Diet: Heart Healthy /Carb modified , with feeding assistance and aspiration precautions.  For Heart failure patients - Check your Weight same time everyday, if you gain over 2 pounds, or you develop in leg swelling, experience more shortness of breath or chest pain, call your Primary MD immediately. Follow Cardiac Low Salt Diet and 1.5 lit/day fluid restriction.   On your next visit with your primary care physician please Get Medicines reviewed and adjusted.   Please request your Prim.MD to go over all Hospital Tests and Procedure/Radiological results at the follow up, please get all Hospital records sent to your Prim MD by signing hospital release before you go home.   If you experience worsening of your admission symptoms, develop shortness of breath, life threatening emergency, suicidal or homicidal thoughts you must seek medical attention immediately by calling 911 or calling your MD immediately  if symptoms less severe.  You Must read complete instructions/literature along with all the possible adverse reactions/side effects for all the Medicines you take and that have been prescribed to you. Take any new Medicines after you have completely understood and accpet all the possible adverse reactions/side effects.   Do not drive, operating heavy machinery, perform activities at heights, swimming or participation in water activities or provide baby sitting services if your were admitted for syncope or siezures until you have seen by Primary MD or a Neurologist and advised to do so again.  Do not drive when taking Pain medications.    Do not take more than prescribed Pain, Sleep and Anxiety Medications  Special Instructions: If you have smoked or chewed Tobacco  in the last 2 yrs please stop smoking, stop any regular Alcohol  and or any Recreational drug  use.  Wear Seat belts while driving.   Please note  You were cared for by a hospitalist during your hospital stay. If you have any questions about your discharge medications or the care you received while you were in the hospital after you are discharged, you can call the unit and asked to speak with the hospitalist on call if the hospitalist that took care of you is not available. Once you are discharged, your primary care physician will handle any further medical issues. Please note that NO REFILLS for any discharge medications will be authorized once you are discharged, as it is imperative that you return to your primary care physician (or establish a relationship with a primary care physician if you do not have one) for your aftercare needs so that they can reassess your need for medications and monitor your lab values.

## 2016-07-24 NOTE — Clinical Social Work Note (Signed)
Medical Social Worker facilitated patient discharge including contacting patient family and facility to confirm patient discharge plans.  Clinical information faxed to facility and family agreeable with plan.  Patient's dtr, York Cerise to transport patient back to [place].  RN to call report prior to discharge.  Medical Social Worker will sign off for now as social work intervention is no longer needed. Please consult [place] again if new need arises.  Glendon Axe, MSW [phone] 07/24/2016 12:19 PM

## 2016-07-24 NOTE — Discharge Summary (Signed)
Sydney [name], is a 82 y.o. female  DOB [birth year]  MRN [id number].  Admission date:  07/18/2016  Admitting Physician  Norval Morton, MD  Discharge Date:  07/24/2016   Primary MD  ARONSON,RICHARD A, MD  Recommendations for primary care physician for things to follow:  - Please check CBC, CMP in 3 days to ensure stable LFTs. - This follow on the final results on repeat blood cultures done on 12/7, so far no growth to date  Admission Diagnosis  Sepsis, due to unspecified organism [facility]) [A41.9]   Discharge Diagnosis  Sepsis, due to unspecified organism [facility]) [A41.9]   Principal Problem:   Sepsis due to undetermined organism with metabolic encephalopathy ([place]) Active Problems:   Hypothyroidism   Diabetes mellitus type 2, controlled ([place])   Essential hypertension   Atrial fibrillation ([place])   Transaminitis   Hyperbilirubinemia      Past Medical History:  Diagnosis Date  . Anemia   . Blood transfusion ~ 2012   "once; not related to a surgery or bleeding that I remember" (06/06/2013)  . Bronchial pneumonia    "once; several years ago" (06/06/2013)  . CHF (congestive heart failure) (HCC)    takes Lasix every other day  . Chronic atrial fibrillation (HCC)    takes Coumadin as instructed  . COPD (chronic obstructive pulmonary disease) ([place])   . Diabetes mellitus    takes Metformin and Amaryl daily  . Emphysema   . History of head injury    many yrs ago  . Hypercholesterolemia    doesn't require meds for this  . Hypertension    takes Prinizide daily  . Hypothyroidism    takes Synthroid daily  . Lung disease    BRONCHOSPASTIC  . Macular degeneration    dry  . Neuropathy (HCC)    takes Gabapentin bid  . Peripheral vascular disease ([place])   . Rheumatoid arthritis ([place])    "shoulders; daughter gives me shots of Methotrexate q week" (06/06/2013)  . Vaginal discharge    has seen GYN but  says its nothing to worry about    Past Surgical History:  Procedure Laterality Date  . AMPUTATION  11/09/2011   Procedure: AMPUTATION DIGIT;  Surgeon: Mcarthur Rossetti, MD;  Location: [place];  Service: Orthopedics;  Laterality: Right;  Right Great Toe Amputation  . AMPUTATION  01/05/2012   Procedure: AMPUTATION RAY;  Surgeon: Mcarthur Rossetti, MD;  Location: [facility];  Service: Orthopedics;  Laterality: Left;  Amputation Left 4th and 5th Toes  . AMPUTATION  05/31/2012   Procedure: AMPUTATION RAY;  Surgeon: Newt Minion, MD;  Location: [place];  Service: Orthopedics;  Laterality: Left;  Left foot 1st ray amp  . AMPUTATION  06/21/2012   Procedure: AMPUTATION BELOW KNEE;  Surgeon: Newt Minion, MD;  Location: [place];  Service: Orthopedics;  Laterality: Left;  Left Below Knee Amputation  . AMPUTATION Right 06/06/2013   Procedure: RIGHT MIDFOOT AMPUTATION;  Surgeon: Newt Minion, MD;  Location: [place];  Service: Orthopedics;  Laterality: Right;  . APPENDECTOMY    . BACK SURGERY    . CARDIOVERSION    . CATARACT EXTRACTION W/ INTRAOCULAR LENS  IMPLANT, BILATERAL Bilateral   . CERVICAL DISC SURGERY     "the top vertebrae" (06/06/2013)  . CHOLECYSTECTOMY  1951  . DILATION AND CURETTAGE OF UTERUS    . EYE SURGERY     bilateral -cataract surgery  . FOOT AMPUTATION THROUGH METATARSAL Right 06/06/2013   midfoot/notes 06/06/2013  . FRACTURE SURGERY  2011   LEFT HIP FX /REPLACEMENT   . JOINT REPLACEMENT    . TOTAL ABDOMINAL HYSTERECTOMY  1975  . TOTAL HIP ARTHROPLASTY Left ?2013       History of present illness and  Hospital Course:     Kindly see H&P for history of present illness and admission details, please review complete Labs, Consult reports and Test reports for all details in brief  HPI  from the history and physical done on the day of admission 07/18/2016 HPI: Sydney [name] is a 82 y.o. female with medical history significant of  Afib on Eliquis, HTN, HLD, DM type II,  Hyopothyroidism, left BKA, COPD, anemia; who presented with reports of confusion and fever. The patient's daughter is present at bedside and helps provide history as patient is acutely altered. Patient was last seen relatively normal approximately 3 days ago. Review of records notes that the patient had been progressively more confused over the last 3 days. She had complained of not feeling well yesterday and complained of some chest discomfort after eating aan onionfor which she was given acid reflux medication. Today nursing staff noted that the patient had a fever of 1061F and had acute onset of nausea and vomiting. Associated symptoms include some mention of abdominal pain. At baseline patient is mainly wheelchair bound and can stand to pivot and transition. Patient also was noted to have some expressive aphasia that is otherwise unchanged. Family denies any significant diarrhea, cough, dysuria, or signs of focal weakness.  ED Course: Upon admission into the emergency department patient was seen to be febrile to 102.61F, respirations 19-22, and all other vital signs relatively within normal limits. Lab work revealed WBC 8.7, hemoglobin 11, platelets 141, BUN 20, creatinine 1.4, AST 1047, ALT 493, AP 296, total bilirubin 2.7, ammonia 17, and lactic acid 3.19. Chest x-ray showed cardiomegaly. UA showed no bacteria, small leukocytes, 0-5 squamous epithelial cells, 6-30 Wbcs. CT scan of the abdomen and pelvis showed possible ascending cholangitis and/or mild acute diverticulitis. Patient was started on broad-spectrum antibiotics of vancomycin and Zosyn for sepsis of unknown source.   Hospital Course  82 y.o.femalewith medical history significant of Afib on Eliquis, HTN, HLD, DM type II, Hyopothyroidism,left BKA, COPD, anemia; who presented with reports of confusion and fever, workup significant for elevated LFTs, with imaging evidence of possible cholangitis, seen by GI , MRCP with no acute findings, no  plan for ERCP.   Sepsis secondary to choledocholithiasis versus cholangitis/ E coli bacteremia - GI input greatly appreciated, and with elevated LFTs on admission, there was a concern for cholangitis, so MRCP was obtained, MRCP 12/8 negative, so per GI no plan for ERCP. if these were trending down gradually, they are normal at time of discharge, treated initially with IV Zosyn, then transition to IV Rocephin giving sensitivities, blood cultures were growing Escherichia coli was pansensitive, she was treated with total of 7 days 5 and 20 during hospital stay, will need another 7 days on amoxicillin  as an outpatient, to continue along with lactobacillus.  Escherichia coli bacteremia - Please see above discussion, will need another 7 days of oral amoxicillin, repeat blood cultures with no growth  Elevated LFTs  - see above discussion  Chronic atrial fibrillation:  - Currently rate controlled on diltiazem  - Resumed on Eliquis  Thrombocytopenia - Most likely related to sepsis  AKI on Chronickidney disease stage III:  - Patient's previous baseline noted to be around 1.28 over 2 years ago. Creatinine peaked at 1.79, improving with IV fluids, creatinine 1.06 on discharge  Diabetes mellitus type 2: - 0 mom medication on discharge  Essential hypertension - will need to restart Lasix and potassium chloride when medically appropriate  COPD  - No wheezing, continue Spiriva  History of CHF: - last echo back in 06/2015 - Strict I&O's  - Monitoring overnight for signs of fluid overload and will give IV Lasix if needed  Hypothyroidism - Continue levothyroxine  GERD - protonix    Discharge Condition:  stable   Follow UP  Follow-up Information    ARONSON,RICHARD A, MD Follow up in 1 week(s).   Specialty:  Internal Medicine Contact information: [address] [place] [address] [phone]             Discharge Instructions  and  Discharge Medications     Discharge Instructions    Discharge instructions    Complete by:  As directed    Follow with Primary MD ARONSON,RICHARD A, MD in 7 days   Get CBC, CMP,  checked  by Primary MD next visit.    Disposition ALF   Diet: Heart Healthy /Carb modified , with feeding assistance and aspiration precautions.  For Heart failure patients - Check your Weight same time everyday, if you gain over 2 pounds, or you develop in leg swelling, experience more shortness of breath or chest pain, call your Primary MD immediately. Follow Cardiac Low Salt Diet and 1.5 lit/day fluid restriction.   On your next visit with your primary care physician please Get Medicines reviewed and adjusted.   Please request your Prim.MD to go over all Hospital Tests and Procedure/Radiological results at the follow up, please get all Hospital records sent to your Prim MD by signing hospital release before you go home.   If you experience worsening of your admission symptoms, develop shortness of breath, life threatening emergency, suicidal or homicidal thoughts you must seek medical attention immediately by calling 911 or calling your MD immediately  if symptoms less severe.  You Must read complete instructions/literature along with all the possible adverse reactions/side effects for all the Medicines you take and that have been prescribed to you. Take any new Medicines after you have completely understood and accpet all the possible adverse reactions/side effects.   Do not drive, operating heavy machinery, perform activities at heights, swimming or participation in water activities or provide baby sitting services if your were admitted for syncope or siezures until you have seen by Primary MD or a Neurologist and advised to do so again.  Do not drive when taking Pain medications.    Do not take more than prescribed Pain, Sleep and Anxiety Medications  Special Instructions: If you have smoked or chewed Tobacco  in the last  2 yrs please stop smoking, stop any regular Alcohol  and or any Recreational drug use.  Wear Seat belts while driving.   Please note  You were cared for by a hospitalist during your hospital stay. If you  have any questions about your discharge medications or the care you received while you were in the hospital after you are discharged, you can call the unit and asked to speak with the hospitalist on call if the hospitalist that took care of you is not available. Once you are discharged, your primary care physician will handle any further medical issues. Please note that NO REFILLS for any discharge medications will be authorized once you are discharged, as it is imperative that you return to your primary care physician (or establish a relationship with a primary care physician if you do not have one) for your aftercare needs so that they can reassess your need for medications and monitor your lab values.   Increase activity slowly    Complete by:  As directed        Medication List    STOP taking these medications   pantoprazole 40 MG tablet Commonly known as:  PROTONIX     TAKE these medications   acetaminophen 650 MG suppository Commonly known as:  TYLENOL Place 650 mg rectally every 4 (four) hours as needed for mild pain or moderate pain.   acidophilus Caps capsule Take 2 capsules by mouth daily. Start taking on:  07/25/2016   amoxicillin 500 MG capsule Commonly known as:  AMOXIL Take 1 capsule (500 mg total) by mouth 3 (three) times daily. Please take for 7 days then stop. Start taking on:  07/25/2016   diclofenac sodium 1 % Gel Commonly known as:  VOLTAREN Apply 2 g topically 4 (four) times daily.   diltiazem 60 MG tablet Commonly known as:  CARDIZEM Take 60 mg by mouth 2 (two) times daily.   ELIQUIS 2.5 MG Tabs tablet Generic drug:  apixaban Take 2.5 mg by mouth 2 (two) times daily.   folic acid 1 MG tablet Commonly known as:  FOLVITE Take 1 mg by mouth daily.    furosemide 20 MG tablet Commonly known as:  LASIX Take 40 mg by mouth daily.   gabapentin 300 MG capsule Commonly known as:  NEURONTIN Take 300 mg by mouth 2 (two) times daily.   ICAPS PO Take 1 capsule by mouth daily.   insulin lispro 100 UNIT/ML KiwkPen Commonly known as:  HUMALOG Inject 2-8 Units into the skin 2 (two) times daily. Glucose:  0-100= 0 units 101-150= 2 units 151-200- 4 units 201-250= 6 units > 250= 8 units   levothyroxine 50 MCG tablet Commonly known as:  SYNTHROID, LEVOTHROID Take 50 mcg by mouth daily with breakfast.   loperamide 2 MG capsule Commonly known as:  IMODIUM Take 2 mg by mouth daily as needed for diarrhea or loose stools.   metFORMIN 500 MG tablet Commonly known as:  GLUCOPHAGE Take 500 mg by mouth daily.   methotrexate 50 MG/2ML injection Inject 0.4 mLs into the muscle once a week.   multivitamin with minerals Tabs tablet Take 1 tablet by mouth daily.   mupirocin ointment 2 % Commonly known as:  BACTROBAN Apply 1 application topically daily as needed (wound care).   nitroGLYCERIN 0.2 mg/hr patch Commonly known as:  NITRODUR - Dosed in mg/24 hr Place 1 patch onto the skin daily.   potassium chloride SA 20 MEQ tablet Commonly known as:  K-DUR,KLOR-CON Take 20 mEq by mouth 2 (two) times daily.   tiotropium 18 MCG inhalation capsule Commonly known as:  SPIRIVA Place 18 mcg into inhaler and inhale daily with breakfast.   vitamin B-12 500 MCG tablet Commonly known as:  CYANOCOBALAMIN Take 500 mcg by mouth daily.   vitamin C 500 MG tablet Commonly known as:  ASCORBIC ACID Take 500 mg by mouth daily.         Diet and Activity recommendation: See Discharge Instructions above   Consults obtained - GI   Major procedures and Radiology Reports - PLEASE review detailed and final reports for all details, in brief -     Ct Abdomen Pelvis W Contrast  Result Date: 07/18/2016 CLINICAL DATA:  Sepsis, transaminitis. Fever with  increased lethargy. EXAM: CT ABDOMEN AND PELVIS WITH CONTRAST TECHNIQUE: Multidetector CT imaging of the abdomen and pelvis was performed using the standard protocol following bolus administration of intravenous contrast. CONTRAST:  58mL ISOVUE-300 IOPAMIDOL (ISOVUE-300) INJECTION 61% COMPARISON:  None. FINDINGS: Lower chest: Multi chamber cardiomegaly. No pleural fluid or focal airspace disease. Tiny subpleural nodularity in the periphery of the left lower lobe without suspicious characteristics. Atherosclerosis of the descending thoracic aorta. Coronary artery calcifications are seen. Hepatobiliary: Postcholecystectomy. Prominent common bile duct measuring 13 mm at the porta hepatis, mild central intrahepatic biliary ductal dilatation. No calcified choledocholithiasis. Questionable common bile duct wall enhancement, an equivocal finding. No focal hepatic lesion. Pancreas: Fatty atrophy. No ductal dilatation or surrounding inflammation. Spleen:  Normal in size and density.  Probable punctate granuloma. Adrenals/Urinary Tract: No adrenal nodule. Bilateral renal cortical thinning without hydronephrosis. Motion artifact limits assessment for perinephric edema. No focal renal lesion. The ureters are decompressed. No excretion on delayed phase imaging consistent with renal dysfunction. Urinary bladder is decompressed and not well evaluated secondary to streak artifact from adjacent left hip prosthesis. Stomach/Bowel: The stomach is decompressed. No small bowel dilatation or inflammation. The appendix is not visualized, reportedly surgically absent. Small volume of colonic stool. There is diverticulosis involving the descending and sigmoid colon. Minimal soft tissue stranding in the deep right pelvis is adjacent to the sigmoid colon. Stranding from left hip prosthesis partially obscures detailed evaluation. No extraluminal air or abscess. Vascular/Lymphatic: Advanced atherosclerosis of the abdominal aorta and its  branches. Calcified plaque leads to luminal narrowing of the distal abdominal aorta just proximal to the iliac bifurcation. Atherosclerotic calcifications of the intra-abdominal vasculature. No definite intra-abdominal or pelvic adenopathy. Small retroperitoneal lymph nodes are not enlarged by size criteria. Reproductive: The uterus is surgically absent. Neither ovary is confidently identified. Other: No ascites or free air. Tiny fat containing umbilical hernia. Musculoskeletal: Mild T12 superior endplate compression fracture is chronic based on chest radiograph 06/18/2013. The bones are under mineralized. Multilevel degenerative change throughout the lumbar spine. Left hip prosthesis in place. No acute osseous abnormality is seen. IMPRESSION: 1. Mild biliary prominence postcholecystectomy. This is a common postsurgical finding. There is equivocal biliary enhancement, which may be normal for this patient or represent ascending cholangitis. No calcified choledocholithiasis. 2. Distal descending and sigmoid colonic diverticulosis. Mild stranding in the deep right pelvis may be secondary to mild uncomplicated acute diverticulitis, however postsurgical scarring given prior hysterectomy could have a similar appearance. No abscess or perforation. 3. Advanced atherosclerosis. 4. Thinning of the renal parenchyma and absence of renal excretion on delayed phase imaging is consistent with chronic renal disease. 5. Osteopenia/osteoporosis.  Chronic T12 compression fracture. Electronically Signed   By: Jeb Levering M.D.   On: 07/18/2016 18:39   Mr Abdomen Mrcp Wo Contrast  Result Date: 07/21/2016 CLINICAL DATA:  Elevated liver function tests. Sepsis. Fever. Increased lethargy. COPD. CHF. EXAM: MRI ABDOMEN WITHOUT CONTRAST  (INCLUDING MRCP) TECHNIQUE: Multiplanar multisequence MR imaging of the abdomen  was performed. Heavily T2-weighted images of the biliary and pancreatic ducts were obtained, and three-dimensional MRCP  images were rendered by post processing. COMPARISON:  07/18/2016 abdominal CT. FINDINGS: Portions of exam are mildly motion degraded. Lower chest: Development of bilateral pleural effusions. Mild cardiomegaly. Hepatobiliary: No focal liver lesion. Status post cholecystectomy. No intrahepatic duct dilatation. The common duct measures 9 mm in the porta hepatis and 12 mm just above the pancreatic head, including on image 62/ series 4. Upper normal after cholecystectomy on the order of 10-11 mm. No choledocholithiasis. Duct tapers gradually just above the ampulla. No obstructive mass. Pancreas: Pancreatic atrophy which may be within normal variation in this age group. No main pancreatic duct dilatation. Minimal side branch duct ectasia is identified within the pancreatic body, including on image 26/series 3 and image 84/series 4. No definite acute pancreatitis. Spleen:  Normal in size, without focal abnormality. Adrenals/Urinary Tract: Normal adrenal glands. Normal kidneys, without hydronephrosis. Stomach/Bowel: Grossly normal stomach and abdominal bowel loops. Vascular/Lymphatic: Normal caliber of the aorta and branch vessels. No retroperitoneal or retrocrural adenopathy. Other:  Small volume ascites about the tip of the liver.  Anasarca. Musculoskeletal: No acute osseous abnormality. IMPRESSION: 1. Mildly motion degraded exam. 2. Cholecystectomy. Minimal common duct dilatation, without choledocholithiasis or obstructive mass. 3. Minimal side branch duct ectasia within the pancreatic body. This could represent the sequelae of prior pancreatitis. 4. Development of bilateral pleural effusions and small volume ascites. Combined with anasarca, this suggests fluid overload/ congestive heart failure. Electronically Signed   By: Abigail Miyamoto M.D.   On: 07/21/2016 12:56   Mr 3d Recon At Scanner  Result Date: 07/21/2016 CLINICAL DATA:  Elevated liver function tests. Sepsis. Fever. Increased lethargy. COPD. CHF. EXAM: MRI  ABDOMEN WITHOUT CONTRAST  (INCLUDING MRCP) TECHNIQUE: Multiplanar multisequence MR imaging of the abdomen was performed. Heavily T2-weighted images of the biliary and pancreatic ducts were obtained, and three-dimensional MRCP images were rendered by post processing. COMPARISON:  07/18/2016 abdominal CT. FINDINGS: Portions of exam are mildly motion degraded. Lower chest: Development of bilateral pleural effusions. Mild cardiomegaly. Hepatobiliary: No focal liver lesion. Status post cholecystectomy. No intrahepatic duct dilatation. The common duct measures 9 mm in the porta hepatis and 12 mm just above the pancreatic head, including on image 62/ series 4. Upper normal after cholecystectomy on the order of 10-11 mm. No choledocholithiasis. Duct tapers gradually just above the ampulla. No obstructive mass. Pancreas: Pancreatic atrophy which may be within normal variation in this age group. No main pancreatic duct dilatation. Minimal side branch duct ectasia is identified within the pancreatic body, including on image 26/series 3 and image 84/series 4. No definite acute pancreatitis. Spleen:  Normal in size, without focal abnormality. Adrenals/Urinary Tract: Normal adrenal glands. Normal kidneys, without hydronephrosis. Stomach/Bowel: Grossly normal stomach and abdominal bowel loops. Vascular/Lymphatic: Normal caliber of the aorta and branch vessels. No retroperitoneal or retrocrural adenopathy. Other:  Small volume ascites about the tip of the liver.  Anasarca. Musculoskeletal: No acute osseous abnormality. IMPRESSION: 1. Mildly motion degraded exam. 2. Cholecystectomy. Minimal common duct dilatation, without choledocholithiasis or obstructive mass. 3. Minimal side branch duct ectasia within the pancreatic body. This could represent the sequelae of prior pancreatitis. 4. Development of bilateral pleural effusions and small volume ascites. Combined with anasarca, this suggests fluid overload/ congestive heart failure.  Electronically Signed   By: Abigail Miyamoto M.D.   On: 07/21/2016 12:56   Dg Chest Port 1 View  Result Date: 07/19/2016 CLINICAL DATA:  Line placement. EXAM:  PORTABLE CHEST 1 VIEW COMPARISON:  07/18/2016 . FINDINGS: Right PICC line noted with tip projected over the superior vena cava. Cardiomegaly with normal pulmonary vascularity. Mild right upper lobe subsegmental atelectasis. No pleural effusion or pneumothorax. IMPRESSION: 1.  Right PICC line noted with tip projected superior vena cava. 2. Cardiomegaly.  No pulmonary venous congestion. 3. Mild right upper lobe subsegmental atelectasis. Electronically Signed   By: Marcello Moores  Register   On: 07/19/2016 09:39   Dg Chest Port 1 View  Result Date: 07/18/2016 CLINICAL DATA:  FF sepsis, fever EXAM: PORTABLE CHEST 1 VIEW COMPARISON:  02/07/2014 FINDINGS: Cardiomegaly. No confluent airspace opacities or effusions. No acute bony abnormality. IMPRESSION: Cardiomegaly.  No acute findings. Electronically Signed   By: Rolm Baptise M.D.   On: 07/18/2016 16:59    Micro Results    Recent Results (from the past 240 hour(s))  Blood Culture (routine x 2)     Status: Abnormal   Collection Time: 07/18/16  3:59 PM  Result Value Ref Range Status   Specimen Description BLOOD LEFT FOREARM  Final   Special Requests BOTTLES DRAWN AEROBIC AND ANAEROBIC 5CC  Final   Culture  Setup Time   Final    GRAM NEGATIVE RODS IN BOTH AEROBIC AND ANAEROBIC BOTTLES CRITICAL RESULT CALLED TO, READ BACK BY AND VERIFIED WITH: Christella Hartigan PHARMD, AT [id number] 07/19/16 BY D. VANHOOK Performed at [facility] (A)  Final   Report Status 07/21/2016 FINAL  Final   Organism ID, Bacteria ESCHERICHIA COLI  Final      Susceptibility   Escherichia coli - MIC*    AMPICILLIN 8 SENSITIVE Sensitive     CEFAZOLIN <=4 SENSITIVE Sensitive     CEFEPIME <=1 SENSITIVE Sensitive     CEFTAZIDIME <=1 SENSITIVE Sensitive     CEFTRIAXONE <=1 SENSITIVE Sensitive     CIPROFLOXACIN  <=0.25 SENSITIVE Sensitive     GENTAMICIN <=1 SENSITIVE Sensitive     IMIPENEM <=0.25 SENSITIVE Sensitive     TRIMETH/SULFA <=20 SENSITIVE Sensitive     AMPICILLIN/SULBACTAM 4 SENSITIVE Sensitive     PIP/TAZO <=4 SENSITIVE Sensitive     Extended ESBL NEGATIVE Sensitive     * ESCHERICHIA COLI  Blood Culture ID Panel (Reflexed)     Status: Abnormal   Collection Time: 07/18/16  3:59 PM  Result Value Ref Range Status   Enterococcus species NOT DETECTED NOT DETECTED Final   Listeria monocytogenes NOT DETECTED NOT DETECTED Final   Staphylococcus species NOT DETECTED NOT DETECTED Final   Staphylococcus aureus NOT DETECTED NOT DETECTED Final   Streptococcus species NOT DETECTED NOT DETECTED Final   Streptococcus agalactiae NOT DETECTED NOT DETECTED Final   Streptococcus pneumoniae NOT DETECTED NOT DETECTED Final   Streptococcus pyogenes NOT DETECTED NOT DETECTED Final   Acinetobacter baumannii NOT DETECTED NOT DETECTED Final   Enterobacteriaceae species DETECTED (A) NOT DETECTED Final    Comment: CRITICAL RESULT CALLED TO, READ BACK BY AND VERIFIED WITH: C. SUMME PHARMD, AT [id number] 07/19/16 BY D. VANHOOK    Enterobacter cloacae complex NOT DETECTED NOT DETECTED Final   Escherichia coli DETECTED (A) NOT DETECTED Final    Comment: CRITICAL RESULT CALLED TO, READ BACK BY AND VERIFIED WITH: C. SUMME PHARMD, AT [id number] 07/19/16 BY D. VANHOOK    Klebsiella oxytoca NOT DETECTED NOT DETECTED Final   Klebsiella pneumoniae NOT DETECTED NOT DETECTED Final   Proteus species NOT DETECTED NOT DETECTED Final   Serratia marcescens NOT DETECTED NOT DETECTED  Final   Carbapenem resistance NOT DETECTED NOT DETECTED Final   Haemophilus influenzae NOT DETECTED NOT DETECTED Final   Neisseria meningitidis NOT DETECTED NOT DETECTED Final   Pseudomonas aeruginosa NOT DETECTED NOT DETECTED Final   Candida albicans NOT DETECTED NOT DETECTED Final   Candida glabrata NOT DETECTED NOT DETECTED Final   Candida krusei NOT  DETECTED NOT DETECTED Final   Candida parapsilosis NOT DETECTED NOT DETECTED Final   Candida tropicalis NOT DETECTED NOT DETECTED Final    Comment: Performed at [facility]  Urine culture     Status: None   Collection Time: 07/18/16  4:00 PM  Result Value Ref Range Status   Specimen Description URINE, CLEAN CATCH  Final   Special Requests NONE  Final   Culture NO GROWTH Performed at [facility]   Final   Report Status 07/20/2016 FINAL  Final  Blood Culture (routine x 2)     Status: Abnormal   Collection Time: 07/18/16  4:05 PM  Result Value Ref Range Status   Specimen Description RIGHT ANTECUBITAL  Final   Special Requests BOTTLES DRAWN AEROBIC AND ANAEROBIC 5CC  Final   Culture  Setup Time   Final    GRAM NEGATIVE RODS IN BOTH AEROBIC AND ANAEROBIC BOTTLES CRITICAL RESULT CALLED TO, READ BACK BY AND VERIFIED WITH: C. SUMME PHARMD, AT [id number] 07/19/16 BY D. VANHOOK    Culture (A)  Final    ESCHERICHIA COLI SUSCEPTIBILITIES PERFORMED ON PREVIOUS CULTURE WITHIN THE LAST 5 DAYS. Performed at [facility]    Report Status 07/21/2016 FINAL  Final  MRSA PCR Screening     Status: Abnormal   Collection Time: 07/19/16  5:57 AM  Result Value Ref Range Status   MRSA by PCR POSITIVE (A) NEGATIVE Final    Comment:        The GeneXpert MRSA Assay (FDA approved for NASAL specimens only), is one component of a comprehensive MRSA colonization surveillance program. It is not intended to diagnose MRSA infection nor to guide or monitor treatment for MRSA infections. RESULT CALLED TO, READ BACK BY AND VERIFIED WITH: [facility] RN AT 1919 ON 07/19/16 BY S.VANHOORNE   Culture, blood (Routine X 2) w Reflex to ID Panel     Status: None (Preliminary result)   Collection Time: 07/20/16 10:52 AM  Result Value Ref Range Status   Specimen Description BLOOD LEFT ARM  Final   Special Requests IN PEDIATRIC BOTTLE 4 CC  Final   Culture   Final    NO GROWTH 3 DAYS Performed at  [facility]    Report Status PENDING  Incomplete  Culture, blood (Routine X 2) w Reflex to ID Panel     Status: None (Preliminary result)   Collection Time: 07/20/16 10:52 AM  Result Value Ref Range Status   Specimen Description BLOOD LEFT HAND  Final   Special Requests IN PEDIATRIC BOTTLE 2 CC  Final   Culture   Final    NO GROWTH 3 DAYS Performed at [facility]    Report Status PENDING  Incomplete       Today   Subjective:   Sydney [name] today has no headache,no chest or abdominal pain,no new weakness tingling or numbness, feels much better wants to go home today.   Objective:   Blood pressure (!) 161/45, pulse 85, temperature 97.9 F (36.6 C), temperature source Oral, resp. rate 20, height 5\' 3"  (1.6 m), weight 70.7 kg (155 lb 13.8 oz), SpO2  96 %.   Intake/Output Summary (Last 24 hours) at 07/24/16 1155 Last data filed at 07/24/16 1122  Gross per 24 hour  Intake              420 ml  Output             1025 ml  Net             -605 ml    Exam  Awake Alert,Communicative, pleasant, Supple Neck,No JVD Symmetrical Chest wall movement, Good air movement bilaterally, CTAB RRR,No Gallops,Rubs or new Murmurs, No Parasternal Heave +ve B.Sounds, Abd Soft, No tenderness, No rebound - guarding or rigidity. No Cyanosis, left BKA, right transtibial amputation  Data Review   CBC w Diff: Lab Results  Component Value Date   WBC 4.5 07/22/2016   HGB 8.8 (L) 07/22/2016   HCT 26.2 (L) 07/22/2016   PLT 117 (L) 07/22/2016   LYMPHOPCT 3 07/18/2016   MONOPCT 7 07/18/2016   EOSPCT 0 07/18/2016   BASOPCT 0 07/18/2016    CMP: Lab Results  Component Value Date   NA 139 07/24/2016   K 3.1 (L) 07/24/2016   CL 103 07/24/2016   CO2 27 07/24/2016   BUN 12 07/24/2016   CREATININE 1.06 (H) 07/24/2016   CREATININE 1.28 (H) 04/02/2014   PROT 5.6 (L) 07/24/2016   ALBUMIN 3.1 (L) 07/24/2016   BILITOT 1.5 (H) 07/24/2016   ALKPHOS 145 (H) 07/24/2016   AST 28  07/24/2016   ALT 68 (H) 07/24/2016  .   Total Time in preparing paper work, data evaluation and todays exam - 35 minutes  Nashiya Disbrow M.D on 07/24/2016 at 11:55 AM  [facility]   Office  [phone]

## 2016-07-25 ENCOUNTER — Telehealth: Payer: Self-pay | Admitting: Cardiology

## 2016-07-25 LAB — CULTURE, BLOOD (ROUTINE X 2)
CULTURE: NO GROWTH
Culture: NO GROWTH

## 2016-07-25 NOTE — Telephone Encounter (Signed)
Daughter notified, pt is due for 1 year follow up (last visit was 07-2015)scheduled next available follow up in january

## 2016-07-25 NOTE — Telephone Encounter (Signed)
Pt daughter stopped by the office pt was d/c from hospital (07/18/2016 - 07/24/2016 (6 days); [facility]) and wants to know if you need to see pt. D/c paperwork does not say to follow up with Cardiology. Only says to follow up with PCP.  Do we need to call and schedule appt for follow up with APP? Please advise

## 2016-07-25 NOTE — Telephone Encounter (Signed)
Pt was admitted with sepsis; she needs fu with primary care as outlined in Sydney [name]

## 2016-07-31 DIAGNOSIS — A4151 Sepsis due to Escherichia coli [E. coli]: Secondary | ICD-10-CM | POA: Diagnosis not present

## 2016-07-31 DIAGNOSIS — J449 Chronic obstructive pulmonary disease, unspecified: Secondary | ICD-10-CM | POA: Diagnosis not present

## 2016-07-31 DIAGNOSIS — R748 Abnormal levels of other serum enzymes: Secondary | ICD-10-CM | POA: Diagnosis not present

## 2016-07-31 DIAGNOSIS — E1151 Type 2 diabetes mellitus with diabetic peripheral angiopathy without gangrene: Secondary | ICD-10-CM | POA: Diagnosis not present

## 2016-07-31 DIAGNOSIS — R4701 Aphasia: Secondary | ICD-10-CM | POA: Diagnosis not present

## 2016-07-31 DIAGNOSIS — I482 Chronic atrial fibrillation: Secondary | ICD-10-CM | POA: Diagnosis not present

## 2016-08-01 DIAGNOSIS — R41841 Cognitive communication deficit: Secondary | ICD-10-CM | POA: Diagnosis not present

## 2016-08-03 DIAGNOSIS — R41841 Cognitive communication deficit: Secondary | ICD-10-CM | POA: Diagnosis not present

## 2016-08-11 DIAGNOSIS — R41841 Cognitive communication deficit: Secondary | ICD-10-CM | POA: Diagnosis not present

## 2016-08-15 DIAGNOSIS — R41841 Cognitive communication deficit: Secondary | ICD-10-CM | POA: Diagnosis not present

## 2016-08-17 DIAGNOSIS — R945 Abnormal results of liver function studies: Secondary | ICD-10-CM | POA: Diagnosis not present

## 2016-08-18 DIAGNOSIS — R41841 Cognitive communication deficit: Secondary | ICD-10-CM | POA: Diagnosis not present

## 2016-08-22 DIAGNOSIS — I482 Chronic atrial fibrillation: Secondary | ICD-10-CM | POA: Diagnosis not present

## 2016-08-22 DIAGNOSIS — R4701 Aphasia: Secondary | ICD-10-CM | POA: Diagnosis not present

## 2016-08-22 DIAGNOSIS — R748 Abnormal levels of other serum enzymes: Secondary | ICD-10-CM | POA: Diagnosis not present

## 2016-08-22 DIAGNOSIS — J449 Chronic obstructive pulmonary disease, unspecified: Secondary | ICD-10-CM | POA: Diagnosis not present

## 2016-08-22 DIAGNOSIS — E1151 Type 2 diabetes mellitus with diabetic peripheral angiopathy without gangrene: Secondary | ICD-10-CM | POA: Diagnosis not present

## 2016-08-22 DIAGNOSIS — A4151 Sepsis due to Escherichia coli [E. coli]: Secondary | ICD-10-CM | POA: Diagnosis not present

## 2016-08-22 NOTE — Progress Notes (Signed)
HPI: FU permanent atrial fibrillation. Myoview in June of 2008 showed normal LV function with an ejection fraction of 63% and normal perfusion. Holter monitor in February of 2010 showed A. fib with controlled ventricular response. Patient also has peripheral vascular disease. ABIs in January of 2013 showed greater than 50% bilateral SFA stenosis. Echocardiogram in November 2014 showed normal LV function, mild aortic insufficiency and mitral regurgitation and mild right atrial enlargement. Patient previously had chest pain but declined further workup requesting only conservative measures. Patient admitted December 2017 with sepsis and Escherichia coli bacteremia. Treated with antibiotics. MRCP negative. Since she was last seen, she denies dyspnea, chest pain, palpitations or syncope. She is somewhat emotional today stating she is tired and ready to go.  Current Outpatient Prescriptions  Medication Sig Dispense Refill  . acetaminophen (TYLENOL) 650 MG suppository Place 650 mg rectally every 4 (four) hours as needed for mild pain or moderate pain.    Marland Kitchen acidophilus (RISAQUAD) CAPS capsule Take 2 capsules by mouth daily. 60 capsule 0  . apixaban (ELIQUIS) 2.5 MG TABS tablet Take 2.5 mg by mouth 2 (two) times daily.    . diclofenac sodium (VOLTAREN) 1 % GEL Apply 2 g topically 4 (four) times daily.    Marland Kitchen diltiazem (CARDIZEM) 60 MG tablet Take 60 mg by mouth 2 (two) times daily.    . folic acid (FOLVITE) 1 MG tablet Take 1 mg by mouth daily.    . furosemide (LASIX) 20 MG tablet Take 40 mg by mouth daily.    Marland Kitchen gabapentin (NEURONTIN) 300 MG capsule Take 300 mg by mouth 2 (two) times daily.    . insulin lispro (HUMALOG) 100 UNIT/ML KiwkPen Inject 2-8 Units into the skin 2 (two) times daily. Glucose:  0-100= 0 units 101-150= 2 units 151-200- 4 units 201-250= 6 units > 250= 8 units    . levothyroxine (SYNTHROID, LEVOTHROID) 50 MCG tablet Take 50 mcg by mouth daily with breakfast.     . loperamide  (IMODIUM) 2 MG capsule Take 2 mg by mouth daily as needed for diarrhea or loose stools.   0  . metFORMIN (GLUCOPHAGE-XR) 500 MG 24 hr tablet Take 1 tablet by mouth daily.  0  . methotrexate 50 MG/2ML injection Inject 0.4 mLs into the muscle once a week.  3  . Multiple Vitamin (MULITIVITAMIN WITH MINERALS) TABS Take 1 tablet by mouth daily.    . Multiple Vitamins-Minerals (ICAPS PO) Take 1 capsule by mouth daily.     . mupirocin ointment (BACTROBAN) 2 % Apply 1 application topically daily as needed (wound care).   97  . nitroGLYCERIN (NITRODUR - DOSED IN MG/24 HR) 0.2 mg/hr patch Place 1 patch onto the skin daily.  10  . potassium chloride SA (K-DUR,KLOR-CON) 20 MEQ tablet Take 20 mEq by mouth 2 (two) times daily.    Marland Kitchen tiotropium (SPIRIVA) 18 MCG inhalation capsule Place 18 mcg into inhaler and inhale daily with breakfast.     . vitamin B-12 (CYANOCOBALAMIN) 500 MCG tablet Take 500 mcg by mouth daily.    . vitamin C (ASCORBIC ACID) 500 MG tablet Take 500 mg by mouth daily.     No current facility-administered medications for this visit.      Past Medical History:  Diagnosis Date  . Anemia   . Blood transfusion ~ 2012   "once; not related to a surgery or bleeding that I remember" (06/06/2013)  . Bronchial pneumonia    "once; several years ago" (06/06/2013)  .  CHF (congestive heart failure) (HCC)    takes Lasix every other day  . Chronic atrial fibrillation (HCC)    takes Coumadin as instructed  . COPD (chronic obstructive pulmonary disease) ([place])   . Diabetes mellitus    takes Metformin and Amaryl daily  . Emphysema   . History of head injury    many yrs ago  . Hypercholesterolemia    doesn't require meds for this  . Hypertension    takes Prinizide daily  . Hypothyroidism    takes Synthroid daily  . Lung disease    BRONCHOSPASTIC  . Macular degeneration    dry  . Neuropathy (HCC)    takes Gabapentin bid  . Peripheral vascular disease ([place])   . Rheumatoid arthritis ([place])      "shoulders; daughter gives me shots of Methotrexate q week" (06/06/2013)  . Vaginal discharge    has seen GYN but says its nothing to worry about    Past Surgical History:  Procedure Laterality Date  . AMPUTATION  11/09/2011   Procedure: AMPUTATION DIGIT;  Surgeon: Mcarthur Rossetti, MD;  Location: [place];  Service: Orthopedics;  Laterality: Right;  Right Great Toe Amputation  . AMPUTATION  01/05/2012   Procedure: AMPUTATION RAY;  Surgeon: Mcarthur Rossetti, MD;  Location: [facility];  Service: Orthopedics;  Laterality: Left;  Amputation Left 4th and 5th Toes  . AMPUTATION  05/31/2012   Procedure: AMPUTATION RAY;  Surgeon: Newt Minion, MD;  Location: [place];  Service: Orthopedics;  Laterality: Left;  Left foot 1st ray amp  . AMPUTATION  06/21/2012   Procedure: AMPUTATION BELOW KNEE;  Surgeon: Newt Minion, MD;  Location: [place];  Service: Orthopedics;  Laterality: Left;  Left Below Knee Amputation  . AMPUTATION Right 06/06/2013   Procedure: RIGHT MIDFOOT AMPUTATION;  Surgeon: Newt Minion, MD;  Location: [place];  Service: Orthopedics;  Laterality: Right;  . APPENDECTOMY    . BACK SURGERY    . CARDIOVERSION    . CATARACT EXTRACTION W/ INTRAOCULAR LENS  IMPLANT, BILATERAL Bilateral   . CERVICAL DISC SURGERY     "the top vertebrae" (06/06/2013)  . CHOLECYSTECTOMY  1951  . DILATION AND CURETTAGE OF UTERUS    . EYE SURGERY     bilateral -cataract surgery  . FOOT AMPUTATION THROUGH METATARSAL Right 06/06/2013   midfoot/notes 06/06/2013  . FRACTURE SURGERY  2011   LEFT HIP FX /REPLACEMENT   . JOINT REPLACEMENT    . TOTAL ABDOMINAL HYSTERECTOMY  1975  . TOTAL HIP ARTHROPLASTY Left ?2013    Social History   Social History  . Marital status: Widowed    Spouse name: N/A  . Number of children: N/A  . Years of education: N/A   Occupational History  . Not on file.   Social History Main Topics  . Smoking status: Former Smoker    Years: 4.00    Types: Cigarettes  . Smokeless  tobacco: Never Used     Comment: 06/06/2013 "smoked a little in my teens"  . Alcohol use No  . Drug use: No  . Sexual activity: No   Other Topics Concern  . Not on file   Social History Narrative  . No narrative on file    Family History  Problem Relation Age of Onset  . Heart disease Mother   . Stroke Mother   . Heart disease Father   . Stroke Father   . Heart attack Father   . Muscular dystrophy Brother   .  Anesthesia problems Neg Hx   . Hypotension Neg Hx   . Malignant hyperthermia Neg Hx   . Pseudochol deficiency Neg Hx     ROS: no fevers or chills, productive cough, hemoptysis, dysphasia, odynophagia, melena, hematochezia, dysuria, hematuria, rash, seizure activity, orthopnea, PND. Remaining systems are negative.  Physical Exam: Well-developed elderly in no acute distress.  Skin is warm and dry.  HEENT is normal.  Neck is supple.  Chest is clear to auscultation with normal expansion.  Cardiovascular exam is irregular Abdominal exam nontender or distended. No masses palpated. Extremities s/p amputation neuro grossly intact  ECG-Jul 18 2016-atrial fibrillation, septal infarct.  A/P  1 Permanent atrial fibrillation-continue Cardizem for rate control. Continue apixaban. Change to 5 mg BID (68 Kg, Cr 1.25, age 83).  2 hypertension-blood pressure controlled. Continue present medications.  3 chronic diastolic congestive heart failure-contingent present dose of diuretics.   4 hyperlipidemia-management per primary care.      Kirk Ruths, MD

## 2016-08-23 DIAGNOSIS — R41841 Cognitive communication deficit: Secondary | ICD-10-CM | POA: Diagnosis not present

## 2016-08-24 DIAGNOSIS — R41841 Cognitive communication deficit: Secondary | ICD-10-CM | POA: Diagnosis not present

## 2016-08-25 ENCOUNTER — Encounter: Payer: Self-pay | Admitting: Cardiology

## 2016-08-25 ENCOUNTER — Ambulatory Visit (INDEPENDENT_AMBULATORY_CARE_PROVIDER_SITE_OTHER): Payer: Medicare Other | Admitting: Cardiology

## 2016-08-25 VITALS — BP 140/64 | HR 70 | Ht 63.0 in | Wt 155.0 lb

## 2016-08-25 DIAGNOSIS — I5031 Acute diastolic (congestive) heart failure: Secondary | ICD-10-CM | POA: Diagnosis not present

## 2016-08-25 DIAGNOSIS — R41841 Cognitive communication deficit: Secondary | ICD-10-CM | POA: Diagnosis not present

## 2016-08-25 DIAGNOSIS — I10 Essential (primary) hypertension: Secondary | ICD-10-CM

## 2016-08-25 DIAGNOSIS — I482 Chronic atrial fibrillation, unspecified: Secondary | ICD-10-CM

## 2016-08-25 MED ORDER — APIXABAN 5 MG PO TABS: 5.0000 mg | ORAL_TABLET | Freq: Two times a day (BID) | ORAL | Status: AC

## 2016-08-25 NOTE — Patient Instructions (Signed)
Medication Instructions:   INCREASE ELIQUIS TO 5 MG TWICE DAILY  Follow-Up:  Your physician wants you to follow-up in: [place] will receive a reminder letter in the mail two months in advance. If you don't receive a letter, please call our office to schedule the follow-up appointment.

## 2016-08-28 DIAGNOSIS — R41841 Cognitive communication deficit: Secondary | ICD-10-CM | POA: Diagnosis not present

## 2016-09-04 DIAGNOSIS — R4701 Aphasia: Secondary | ICD-10-CM | POA: Diagnosis not present

## 2016-09-04 DIAGNOSIS — Z6825 Body mass index (BMI) 25.0-25.9, adult: Secondary | ICD-10-CM | POA: Diagnosis not present

## 2016-09-04 DIAGNOSIS — M0579 Rheumatoid arthritis with rheumatoid factor of multiple sites without organ or systems involvement: Secondary | ICD-10-CM | POA: Diagnosis not present

## 2016-09-04 DIAGNOSIS — M150 Primary generalized (osteo)arthritis: Secondary | ICD-10-CM | POA: Diagnosis not present

## 2016-09-04 DIAGNOSIS — E663 Overweight: Secondary | ICD-10-CM | POA: Diagnosis not present

## 2016-09-04 DIAGNOSIS — Z79899 Other long term (current) drug therapy: Secondary | ICD-10-CM | POA: Diagnosis not present

## 2016-09-05 DIAGNOSIS — N183 Chronic kidney disease, stage 3 (moderate): Secondary | ICD-10-CM | POA: Diagnosis not present

## 2016-09-05 DIAGNOSIS — E119 Type 2 diabetes mellitus without complications: Secondary | ICD-10-CM | POA: Diagnosis not present

## 2016-09-06 ENCOUNTER — Telehealth (INDEPENDENT_AMBULATORY_CARE_PROVIDER_SITE_OTHER): Payer: Self-pay | Admitting: Orthopedic Surgery

## 2016-09-06 NOTE — Telephone Encounter (Signed)
Patients daughter called wanting to pick up donuts for her moms shoes. Please call her and let her know when these will be available. She has been waiting over a week.  Requesting 10  Requesting Friday for pick up as she will be in the area   [phone]

## 2016-09-07 NOTE — Telephone Encounter (Signed)
I called and spoke with patient daughter York Cerise to advise these would be at the front desk for her for pick up.

## 2016-10-09 DIAGNOSIS — I872 Venous insufficiency (chronic) (peripheral): Secondary | ICD-10-CM | POA: Diagnosis not present

## 2016-10-09 DIAGNOSIS — L89610 Pressure ulcer of right heel, unstageable: Secondary | ICD-10-CM | POA: Diagnosis not present

## 2016-10-09 DIAGNOSIS — L03115 Cellulitis of right lower limb: Secondary | ICD-10-CM | POA: Diagnosis not present

## 2016-10-10 DIAGNOSIS — L97412 Non-pressure chronic ulcer of right heel and midfoot with fat layer exposed: Secondary | ICD-10-CM | POA: Diagnosis not present

## 2016-10-10 DIAGNOSIS — L97512 Non-pressure chronic ulcer of other part of right foot with fat layer exposed: Secondary | ICD-10-CM | POA: Diagnosis not present

## 2016-10-17 DIAGNOSIS — L97412 Non-pressure chronic ulcer of right heel and midfoot with fat layer exposed: Secondary | ICD-10-CM | POA: Diagnosis not present

## 2016-10-17 DIAGNOSIS — L97512 Non-pressure chronic ulcer of other part of right foot with fat layer exposed: Secondary | ICD-10-CM | POA: Diagnosis not present

## 2016-10-24 DIAGNOSIS — L97412 Non-pressure chronic ulcer of right heel and midfoot with fat layer exposed: Secondary | ICD-10-CM | POA: Diagnosis not present

## 2016-10-24 DIAGNOSIS — L97512 Non-pressure chronic ulcer of other part of right foot with fat layer exposed: Secondary | ICD-10-CM | POA: Diagnosis not present

## 2016-10-31 DIAGNOSIS — L97512 Non-pressure chronic ulcer of other part of right foot with fat layer exposed: Secondary | ICD-10-CM | POA: Diagnosis not present

## 2016-10-31 DIAGNOSIS — L97412 Non-pressure chronic ulcer of right heel and midfoot with fat layer exposed: Secondary | ICD-10-CM | POA: Diagnosis not present

## 2016-11-07 DIAGNOSIS — L97512 Non-pressure chronic ulcer of other part of right foot with fat layer exposed: Secondary | ICD-10-CM | POA: Diagnosis not present

## 2016-11-07 DIAGNOSIS — L97412 Non-pressure chronic ulcer of right heel and midfoot with fat layer exposed: Secondary | ICD-10-CM | POA: Diagnosis not present

## 2016-11-14 DIAGNOSIS — L97512 Non-pressure chronic ulcer of other part of right foot with fat layer exposed: Secondary | ICD-10-CM | POA: Diagnosis not present

## 2016-11-14 DIAGNOSIS — L97412 Non-pressure chronic ulcer of right heel and midfoot with fat layer exposed: Secondary | ICD-10-CM | POA: Diagnosis not present

## 2016-11-21 DIAGNOSIS — L97512 Non-pressure chronic ulcer of other part of right foot with fat layer exposed: Secondary | ICD-10-CM | POA: Diagnosis not present

## 2016-11-21 DIAGNOSIS — L97412 Non-pressure chronic ulcer of right heel and midfoot with fat layer exposed: Secondary | ICD-10-CM | POA: Diagnosis not present

## 2016-11-28 DIAGNOSIS — L97412 Non-pressure chronic ulcer of right heel and midfoot with fat layer exposed: Secondary | ICD-10-CM | POA: Diagnosis not present

## 2016-11-28 DIAGNOSIS — L97512 Non-pressure chronic ulcer of other part of right foot with fat layer exposed: Secondary | ICD-10-CM | POA: Diagnosis not present

## 2016-12-05 DIAGNOSIS — L97412 Non-pressure chronic ulcer of right heel and midfoot with fat layer exposed: Secondary | ICD-10-CM | POA: Diagnosis not present

## 2016-12-05 DIAGNOSIS — I10 Essential (primary) hypertension: Secondary | ICD-10-CM | POA: Diagnosis not present

## 2016-12-05 DIAGNOSIS — M0579 Rheumatoid arthritis with rheumatoid factor of multiple sites without organ or systems involvement: Secondary | ICD-10-CM | POA: Diagnosis not present

## 2016-12-05 DIAGNOSIS — D649 Anemia, unspecified: Secondary | ICD-10-CM | POA: Diagnosis not present

## 2016-12-05 DIAGNOSIS — E1140 Type 2 diabetes mellitus with diabetic neuropathy, unspecified: Secondary | ICD-10-CM | POA: Diagnosis not present

## 2016-12-05 DIAGNOSIS — L97512 Non-pressure chronic ulcer of other part of right foot with fat layer exposed: Secondary | ICD-10-CM | POA: Diagnosis not present

## 2016-12-11 DIAGNOSIS — Z1389 Encounter for screening for other disorder: Secondary | ICD-10-CM | POA: Diagnosis not present

## 2016-12-11 DIAGNOSIS — L89610 Pressure ulcer of right heel, unstageable: Secondary | ICD-10-CM | POA: Diagnosis not present

## 2016-12-11 DIAGNOSIS — I872 Venous insufficiency (chronic) (peripheral): Secondary | ICD-10-CM | POA: Diagnosis not present

## 2016-12-11 DIAGNOSIS — R197 Diarrhea, unspecified: Secondary | ICD-10-CM | POA: Diagnosis not present

## 2016-12-11 DIAGNOSIS — F0390 Unspecified dementia without behavioral disturbance: Secondary | ICD-10-CM | POA: Diagnosis not present

## 2016-12-11 DIAGNOSIS — R4701 Aphasia: Secondary | ICD-10-CM | POA: Diagnosis not present

## 2016-12-11 DIAGNOSIS — E1151 Type 2 diabetes mellitus with diabetic peripheral angiopathy without gangrene: Secondary | ICD-10-CM | POA: Diagnosis not present

## 2016-12-11 DIAGNOSIS — R748 Abnormal levels of other serum enzymes: Secondary | ICD-10-CM | POA: Diagnosis not present

## 2016-12-12 DIAGNOSIS — L97512 Non-pressure chronic ulcer of other part of right foot with fat layer exposed: Secondary | ICD-10-CM | POA: Diagnosis not present

## 2016-12-12 DIAGNOSIS — L97412 Non-pressure chronic ulcer of right heel and midfoot with fat layer exposed: Secondary | ICD-10-CM | POA: Diagnosis not present

## 2016-12-14 DIAGNOSIS — E11621 Type 2 diabetes mellitus with foot ulcer: Secondary | ICD-10-CM | POA: Diagnosis not present

## 2016-12-14 DIAGNOSIS — I110 Hypertensive heart disease with heart failure: Secondary | ICD-10-CM | POA: Diagnosis not present

## 2016-12-14 DIAGNOSIS — L97512 Non-pressure chronic ulcer of other part of right foot with fat layer exposed: Secondary | ICD-10-CM | POA: Diagnosis not present

## 2016-12-14 DIAGNOSIS — I4891 Unspecified atrial fibrillation: Secondary | ICD-10-CM | POA: Diagnosis not present

## 2016-12-14 DIAGNOSIS — I509 Heart failure, unspecified: Secondary | ICD-10-CM | POA: Diagnosis not present

## 2016-12-14 DIAGNOSIS — Z794 Long term (current) use of insulin: Secondary | ICD-10-CM | POA: Diagnosis not present

## 2016-12-14 DIAGNOSIS — R2689 Other abnormalities of gait and mobility: Secondary | ICD-10-CM | POA: Diagnosis not present

## 2016-12-14 DIAGNOSIS — M6281 Muscle weakness (generalized): Secondary | ICD-10-CM | POA: Diagnosis not present

## 2016-12-14 DIAGNOSIS — E1151 Type 2 diabetes mellitus with diabetic peripheral angiopathy without gangrene: Secondary | ICD-10-CM | POA: Diagnosis not present

## 2016-12-14 DIAGNOSIS — L97911 Non-pressure chronic ulcer of unspecified part of right lower leg limited to breakdown of skin: Secondary | ICD-10-CM | POA: Diagnosis not present

## 2016-12-14 DIAGNOSIS — R41841 Cognitive communication deficit: Secondary | ICD-10-CM | POA: Diagnosis not present

## 2016-12-14 DIAGNOSIS — Z89512 Acquired absence of left leg below knee: Secondary | ICD-10-CM | POA: Diagnosis not present

## 2016-12-15 DIAGNOSIS — R41841 Cognitive communication deficit: Secondary | ICD-10-CM | POA: Diagnosis not present

## 2016-12-15 DIAGNOSIS — I509 Heart failure, unspecified: Secondary | ICD-10-CM | POA: Diagnosis not present

## 2016-12-15 DIAGNOSIS — I110 Hypertensive heart disease with heart failure: Secondary | ICD-10-CM | POA: Diagnosis not present

## 2016-12-15 DIAGNOSIS — L97911 Non-pressure chronic ulcer of unspecified part of right lower leg limited to breakdown of skin: Secondary | ICD-10-CM | POA: Diagnosis not present

## 2016-12-15 DIAGNOSIS — E11621 Type 2 diabetes mellitus with foot ulcer: Secondary | ICD-10-CM | POA: Diagnosis not present

## 2016-12-15 DIAGNOSIS — L97512 Non-pressure chronic ulcer of other part of right foot with fat layer exposed: Secondary | ICD-10-CM | POA: Diagnosis not present

## 2016-12-18 DIAGNOSIS — I110 Hypertensive heart disease with heart failure: Secondary | ICD-10-CM | POA: Diagnosis not present

## 2016-12-18 DIAGNOSIS — L97512 Non-pressure chronic ulcer of other part of right foot with fat layer exposed: Secondary | ICD-10-CM | POA: Diagnosis not present

## 2016-12-18 DIAGNOSIS — R41841 Cognitive communication deficit: Secondary | ICD-10-CM | POA: Diagnosis not present

## 2016-12-18 DIAGNOSIS — I509 Heart failure, unspecified: Secondary | ICD-10-CM | POA: Diagnosis not present

## 2016-12-18 DIAGNOSIS — L97911 Non-pressure chronic ulcer of unspecified part of right lower leg limited to breakdown of skin: Secondary | ICD-10-CM | POA: Diagnosis not present

## 2016-12-18 DIAGNOSIS — E11621 Type 2 diabetes mellitus with foot ulcer: Secondary | ICD-10-CM | POA: Diagnosis not present

## 2016-12-19 DIAGNOSIS — R41841 Cognitive communication deficit: Secondary | ICD-10-CM | POA: Diagnosis not present

## 2016-12-19 DIAGNOSIS — L97512 Non-pressure chronic ulcer of other part of right foot with fat layer exposed: Secondary | ICD-10-CM | POA: Diagnosis not present

## 2016-12-19 DIAGNOSIS — L97412 Non-pressure chronic ulcer of right heel and midfoot with fat layer exposed: Secondary | ICD-10-CM | POA: Diagnosis not present

## 2016-12-19 DIAGNOSIS — I509 Heart failure, unspecified: Secondary | ICD-10-CM | POA: Diagnosis not present

## 2016-12-19 DIAGNOSIS — E11621 Type 2 diabetes mellitus with foot ulcer: Secondary | ICD-10-CM | POA: Diagnosis not present

## 2016-12-19 DIAGNOSIS — I110 Hypertensive heart disease with heart failure: Secondary | ICD-10-CM | POA: Diagnosis not present

## 2016-12-19 DIAGNOSIS — L97911 Non-pressure chronic ulcer of unspecified part of right lower leg limited to breakdown of skin: Secondary | ICD-10-CM | POA: Diagnosis not present

## 2016-12-20 DIAGNOSIS — L97512 Non-pressure chronic ulcer of other part of right foot with fat layer exposed: Secondary | ICD-10-CM | POA: Diagnosis not present

## 2016-12-20 DIAGNOSIS — I509 Heart failure, unspecified: Secondary | ICD-10-CM | POA: Diagnosis not present

## 2016-12-20 DIAGNOSIS — R41841 Cognitive communication deficit: Secondary | ICD-10-CM | POA: Diagnosis not present

## 2016-12-20 DIAGNOSIS — E11621 Type 2 diabetes mellitus with foot ulcer: Secondary | ICD-10-CM | POA: Diagnosis not present

## 2016-12-20 DIAGNOSIS — I110 Hypertensive heart disease with heart failure: Secondary | ICD-10-CM | POA: Diagnosis not present

## 2016-12-20 DIAGNOSIS — L97911 Non-pressure chronic ulcer of unspecified part of right lower leg limited to breakdown of skin: Secondary | ICD-10-CM | POA: Diagnosis not present

## 2016-12-22 DIAGNOSIS — I110 Hypertensive heart disease with heart failure: Secondary | ICD-10-CM | POA: Diagnosis not present

## 2016-12-22 DIAGNOSIS — E11621 Type 2 diabetes mellitus with foot ulcer: Secondary | ICD-10-CM | POA: Diagnosis not present

## 2016-12-22 DIAGNOSIS — L97512 Non-pressure chronic ulcer of other part of right foot with fat layer exposed: Secondary | ICD-10-CM | POA: Diagnosis not present

## 2016-12-22 DIAGNOSIS — R41841 Cognitive communication deficit: Secondary | ICD-10-CM | POA: Diagnosis not present

## 2016-12-22 DIAGNOSIS — I509 Heart failure, unspecified: Secondary | ICD-10-CM | POA: Diagnosis not present

## 2016-12-22 DIAGNOSIS — L97911 Non-pressure chronic ulcer of unspecified part of right lower leg limited to breakdown of skin: Secondary | ICD-10-CM | POA: Diagnosis not present

## 2016-12-25 DIAGNOSIS — I110 Hypertensive heart disease with heart failure: Secondary | ICD-10-CM | POA: Diagnosis not present

## 2016-12-25 DIAGNOSIS — L97911 Non-pressure chronic ulcer of unspecified part of right lower leg limited to breakdown of skin: Secondary | ICD-10-CM | POA: Diagnosis not present

## 2016-12-25 DIAGNOSIS — I509 Heart failure, unspecified: Secondary | ICD-10-CM | POA: Diagnosis not present

## 2016-12-25 DIAGNOSIS — R41841 Cognitive communication deficit: Secondary | ICD-10-CM | POA: Diagnosis not present

## 2016-12-25 DIAGNOSIS — E11621 Type 2 diabetes mellitus with foot ulcer: Secondary | ICD-10-CM | POA: Diagnosis not present

## 2016-12-25 DIAGNOSIS — L97512 Non-pressure chronic ulcer of other part of right foot with fat layer exposed: Secondary | ICD-10-CM | POA: Diagnosis not present

## 2016-12-26 DIAGNOSIS — E11621 Type 2 diabetes mellitus with foot ulcer: Secondary | ICD-10-CM | POA: Diagnosis not present

## 2016-12-26 DIAGNOSIS — L97911 Non-pressure chronic ulcer of unspecified part of right lower leg limited to breakdown of skin: Secondary | ICD-10-CM | POA: Diagnosis not present

## 2016-12-26 DIAGNOSIS — I110 Hypertensive heart disease with heart failure: Secondary | ICD-10-CM | POA: Diagnosis not present

## 2016-12-26 DIAGNOSIS — L97512 Non-pressure chronic ulcer of other part of right foot with fat layer exposed: Secondary | ICD-10-CM | POA: Diagnosis not present

## 2016-12-26 DIAGNOSIS — R41841 Cognitive communication deficit: Secondary | ICD-10-CM | POA: Diagnosis not present

## 2016-12-26 DIAGNOSIS — L97412 Non-pressure chronic ulcer of right heel and midfoot with fat layer exposed: Secondary | ICD-10-CM | POA: Diagnosis not present

## 2016-12-26 DIAGNOSIS — I509 Heart failure, unspecified: Secondary | ICD-10-CM | POA: Diagnosis not present

## 2016-12-27 DIAGNOSIS — L97911 Non-pressure chronic ulcer of unspecified part of right lower leg limited to breakdown of skin: Secondary | ICD-10-CM | POA: Diagnosis not present

## 2016-12-27 DIAGNOSIS — R41841 Cognitive communication deficit: Secondary | ICD-10-CM | POA: Diagnosis not present

## 2016-12-27 DIAGNOSIS — I110 Hypertensive heart disease with heart failure: Secondary | ICD-10-CM | POA: Diagnosis not present

## 2016-12-27 DIAGNOSIS — E11621 Type 2 diabetes mellitus with foot ulcer: Secondary | ICD-10-CM | POA: Diagnosis not present

## 2016-12-27 DIAGNOSIS — L97512 Non-pressure chronic ulcer of other part of right foot with fat layer exposed: Secondary | ICD-10-CM | POA: Diagnosis not present

## 2016-12-27 DIAGNOSIS — I509 Heart failure, unspecified: Secondary | ICD-10-CM | POA: Diagnosis not present

## 2016-12-28 DIAGNOSIS — I509 Heart failure, unspecified: Secondary | ICD-10-CM | POA: Diagnosis not present

## 2016-12-28 DIAGNOSIS — L97911 Non-pressure chronic ulcer of unspecified part of right lower leg limited to breakdown of skin: Secondary | ICD-10-CM | POA: Diagnosis not present

## 2016-12-28 DIAGNOSIS — I110 Hypertensive heart disease with heart failure: Secondary | ICD-10-CM | POA: Diagnosis not present

## 2016-12-28 DIAGNOSIS — R41841 Cognitive communication deficit: Secondary | ICD-10-CM | POA: Diagnosis not present

## 2016-12-28 DIAGNOSIS — E11621 Type 2 diabetes mellitus with foot ulcer: Secondary | ICD-10-CM | POA: Diagnosis not present

## 2016-12-28 DIAGNOSIS — L97512 Non-pressure chronic ulcer of other part of right foot with fat layer exposed: Secondary | ICD-10-CM | POA: Diagnosis not present

## 2016-12-29 DIAGNOSIS — L97911 Non-pressure chronic ulcer of unspecified part of right lower leg limited to breakdown of skin: Secondary | ICD-10-CM | POA: Diagnosis not present

## 2016-12-29 DIAGNOSIS — I110 Hypertensive heart disease with heart failure: Secondary | ICD-10-CM | POA: Diagnosis not present

## 2016-12-29 DIAGNOSIS — R41841 Cognitive communication deficit: Secondary | ICD-10-CM | POA: Diagnosis not present

## 2016-12-29 DIAGNOSIS — E11621 Type 2 diabetes mellitus with foot ulcer: Secondary | ICD-10-CM | POA: Diagnosis not present

## 2016-12-29 DIAGNOSIS — L97512 Non-pressure chronic ulcer of other part of right foot with fat layer exposed: Secondary | ICD-10-CM | POA: Diagnosis not present

## 2016-12-29 DIAGNOSIS — I509 Heart failure, unspecified: Secondary | ICD-10-CM | POA: Diagnosis not present

## 2017-01-01 DIAGNOSIS — E11621 Type 2 diabetes mellitus with foot ulcer: Secondary | ICD-10-CM | POA: Diagnosis not present

## 2017-01-01 DIAGNOSIS — I509 Heart failure, unspecified: Secondary | ICD-10-CM | POA: Diagnosis not present

## 2017-01-01 DIAGNOSIS — L97911 Non-pressure chronic ulcer of unspecified part of right lower leg limited to breakdown of skin: Secondary | ICD-10-CM | POA: Diagnosis not present

## 2017-01-01 DIAGNOSIS — I110 Hypertensive heart disease with heart failure: Secondary | ICD-10-CM | POA: Diagnosis not present

## 2017-01-01 DIAGNOSIS — R41841 Cognitive communication deficit: Secondary | ICD-10-CM | POA: Diagnosis not present

## 2017-01-01 DIAGNOSIS — L97512 Non-pressure chronic ulcer of other part of right foot with fat layer exposed: Secondary | ICD-10-CM | POA: Diagnosis not present

## 2017-01-02 DIAGNOSIS — L97512 Non-pressure chronic ulcer of other part of right foot with fat layer exposed: Secondary | ICD-10-CM | POA: Diagnosis not present

## 2017-01-02 DIAGNOSIS — R41841 Cognitive communication deficit: Secondary | ICD-10-CM | POA: Diagnosis not present

## 2017-01-02 DIAGNOSIS — E11621 Type 2 diabetes mellitus with foot ulcer: Secondary | ICD-10-CM | POA: Diagnosis not present

## 2017-01-02 DIAGNOSIS — I110 Hypertensive heart disease with heart failure: Secondary | ICD-10-CM | POA: Diagnosis not present

## 2017-01-02 DIAGNOSIS — L97911 Non-pressure chronic ulcer of unspecified part of right lower leg limited to breakdown of skin: Secondary | ICD-10-CM | POA: Diagnosis not present

## 2017-01-02 DIAGNOSIS — I509 Heart failure, unspecified: Secondary | ICD-10-CM | POA: Diagnosis not present

## 2017-01-03 DIAGNOSIS — I509 Heart failure, unspecified: Secondary | ICD-10-CM | POA: Diagnosis not present

## 2017-01-03 DIAGNOSIS — R41841 Cognitive communication deficit: Secondary | ICD-10-CM | POA: Diagnosis not present

## 2017-01-03 DIAGNOSIS — L97512 Non-pressure chronic ulcer of other part of right foot with fat layer exposed: Secondary | ICD-10-CM | POA: Diagnosis not present

## 2017-01-03 DIAGNOSIS — L97911 Non-pressure chronic ulcer of unspecified part of right lower leg limited to breakdown of skin: Secondary | ICD-10-CM | POA: Diagnosis not present

## 2017-01-03 DIAGNOSIS — E11621 Type 2 diabetes mellitus with foot ulcer: Secondary | ICD-10-CM | POA: Diagnosis not present

## 2017-01-03 DIAGNOSIS — L97412 Non-pressure chronic ulcer of right heel and midfoot with fat layer exposed: Secondary | ICD-10-CM | POA: Diagnosis not present

## 2017-01-03 DIAGNOSIS — I110 Hypertensive heart disease with heart failure: Secondary | ICD-10-CM | POA: Diagnosis not present

## 2017-01-04 DIAGNOSIS — I110 Hypertensive heart disease with heart failure: Secondary | ICD-10-CM | POA: Diagnosis not present

## 2017-01-04 DIAGNOSIS — R41841 Cognitive communication deficit: Secondary | ICD-10-CM | POA: Diagnosis not present

## 2017-01-04 DIAGNOSIS — I509 Heart failure, unspecified: Secondary | ICD-10-CM | POA: Diagnosis not present

## 2017-01-04 DIAGNOSIS — L97911 Non-pressure chronic ulcer of unspecified part of right lower leg limited to breakdown of skin: Secondary | ICD-10-CM | POA: Diagnosis not present

## 2017-01-04 DIAGNOSIS — L97512 Non-pressure chronic ulcer of other part of right foot with fat layer exposed: Secondary | ICD-10-CM | POA: Diagnosis not present

## 2017-01-04 DIAGNOSIS — E11621 Type 2 diabetes mellitus with foot ulcer: Secondary | ICD-10-CM | POA: Diagnosis not present

## 2017-01-06 DIAGNOSIS — L97512 Non-pressure chronic ulcer of other part of right foot with fat layer exposed: Secondary | ICD-10-CM | POA: Diagnosis not present

## 2017-01-06 DIAGNOSIS — E11621 Type 2 diabetes mellitus with foot ulcer: Secondary | ICD-10-CM | POA: Diagnosis not present

## 2017-01-06 DIAGNOSIS — I509 Heart failure, unspecified: Secondary | ICD-10-CM | POA: Diagnosis not present

## 2017-01-06 DIAGNOSIS — I110 Hypertensive heart disease with heart failure: Secondary | ICD-10-CM | POA: Diagnosis not present

## 2017-01-06 DIAGNOSIS — R41841 Cognitive communication deficit: Secondary | ICD-10-CM | POA: Diagnosis not present

## 2017-01-06 DIAGNOSIS — L97911 Non-pressure chronic ulcer of unspecified part of right lower leg limited to breakdown of skin: Secondary | ICD-10-CM | POA: Diagnosis not present

## 2017-01-09 DIAGNOSIS — R41841 Cognitive communication deficit: Secondary | ICD-10-CM | POA: Diagnosis not present

## 2017-01-09 DIAGNOSIS — L97512 Non-pressure chronic ulcer of other part of right foot with fat layer exposed: Secondary | ICD-10-CM | POA: Diagnosis not present

## 2017-01-09 DIAGNOSIS — I110 Hypertensive heart disease with heart failure: Secondary | ICD-10-CM | POA: Diagnosis not present

## 2017-01-09 DIAGNOSIS — L97911 Non-pressure chronic ulcer of unspecified part of right lower leg limited to breakdown of skin: Secondary | ICD-10-CM | POA: Diagnosis not present

## 2017-01-09 DIAGNOSIS — E11621 Type 2 diabetes mellitus with foot ulcer: Secondary | ICD-10-CM | POA: Diagnosis not present

## 2017-01-09 DIAGNOSIS — I509 Heart failure, unspecified: Secondary | ICD-10-CM | POA: Diagnosis not present

## 2017-01-10 DIAGNOSIS — I509 Heart failure, unspecified: Secondary | ICD-10-CM | POA: Diagnosis not present

## 2017-01-10 DIAGNOSIS — I110 Hypertensive heart disease with heart failure: Secondary | ICD-10-CM | POA: Diagnosis not present

## 2017-01-10 DIAGNOSIS — R41841 Cognitive communication deficit: Secondary | ICD-10-CM | POA: Diagnosis not present

## 2017-01-10 DIAGNOSIS — L97512 Non-pressure chronic ulcer of other part of right foot with fat layer exposed: Secondary | ICD-10-CM | POA: Diagnosis not present

## 2017-01-10 DIAGNOSIS — L97911 Non-pressure chronic ulcer of unspecified part of right lower leg limited to breakdown of skin: Secondary | ICD-10-CM | POA: Diagnosis not present

## 2017-01-10 DIAGNOSIS — E11621 Type 2 diabetes mellitus with foot ulcer: Secondary | ICD-10-CM | POA: Diagnosis not present

## 2017-01-11 DIAGNOSIS — L97911 Non-pressure chronic ulcer of unspecified part of right lower leg limited to breakdown of skin: Secondary | ICD-10-CM | POA: Diagnosis not present

## 2017-01-11 DIAGNOSIS — I509 Heart failure, unspecified: Secondary | ICD-10-CM | POA: Diagnosis not present

## 2017-01-11 DIAGNOSIS — R41841 Cognitive communication deficit: Secondary | ICD-10-CM | POA: Diagnosis not present

## 2017-01-11 DIAGNOSIS — I110 Hypertensive heart disease with heart failure: Secondary | ICD-10-CM | POA: Diagnosis not present

## 2017-01-11 DIAGNOSIS — L97512 Non-pressure chronic ulcer of other part of right foot with fat layer exposed: Secondary | ICD-10-CM | POA: Diagnosis not present

## 2017-01-11 DIAGNOSIS — E11621 Type 2 diabetes mellitus with foot ulcer: Secondary | ICD-10-CM | POA: Diagnosis not present

## 2017-01-12 DIAGNOSIS — L97911 Non-pressure chronic ulcer of unspecified part of right lower leg limited to breakdown of skin: Secondary | ICD-10-CM | POA: Diagnosis not present

## 2017-01-12 DIAGNOSIS — L97512 Non-pressure chronic ulcer of other part of right foot with fat layer exposed: Secondary | ICD-10-CM | POA: Diagnosis not present

## 2017-01-12 DIAGNOSIS — E11621 Type 2 diabetes mellitus with foot ulcer: Secondary | ICD-10-CM | POA: Diagnosis not present

## 2017-01-12 DIAGNOSIS — I509 Heart failure, unspecified: Secondary | ICD-10-CM | POA: Diagnosis not present

## 2017-01-12 DIAGNOSIS — R41841 Cognitive communication deficit: Secondary | ICD-10-CM | POA: Diagnosis not present

## 2017-01-12 DIAGNOSIS — R05 Cough: Secondary | ICD-10-CM | POA: Diagnosis not present

## 2017-01-12 DIAGNOSIS — I110 Hypertensive heart disease with heart failure: Secondary | ICD-10-CM | POA: Diagnosis not present

## 2017-01-25 DIAGNOSIS — Z6826 Body mass index (BMI) 26.0-26.9, adult: Secondary | ICD-10-CM | POA: Diagnosis not present

## 2017-01-25 DIAGNOSIS — R4701 Aphasia: Secondary | ICD-10-CM | POA: Diagnosis not present

## 2017-01-25 DIAGNOSIS — E663 Overweight: Secondary | ICD-10-CM | POA: Diagnosis not present

## 2017-01-25 DIAGNOSIS — M0579 Rheumatoid arthritis with rheumatoid factor of multiple sites without organ or systems involvement: Secondary | ICD-10-CM | POA: Diagnosis not present

## 2017-01-25 DIAGNOSIS — Z79899 Other long term (current) drug therapy: Secondary | ICD-10-CM | POA: Diagnosis not present

## 2017-01-25 DIAGNOSIS — M150 Primary generalized (osteo)arthritis: Secondary | ICD-10-CM | POA: Diagnosis not present

## 2017-02-05 DIAGNOSIS — R531 Weakness: Secondary | ICD-10-CM | POA: Diagnosis not present

## 2017-02-27 DIAGNOSIS — R531 Weakness: Secondary | ICD-10-CM | POA: Diagnosis not present

## 2017-03-28 ENCOUNTER — Encounter: Payer: Self-pay | Admitting: Cardiology

## 2017-03-29 DIAGNOSIS — R531 Weakness: Secondary | ICD-10-CM | POA: Diagnosis not present

## 2017-04-20 ENCOUNTER — Ambulatory Visit: Payer: Medicare Other | Admitting: Cardiology

## 2017-05-01 DIAGNOSIS — R531 Weakness: Secondary | ICD-10-CM | POA: Diagnosis not present

## 2017-05-10 DIAGNOSIS — M0579 Rheumatoid arthritis with rheumatoid factor of multiple sites without organ or systems involvement: Secondary | ICD-10-CM | POA: Diagnosis not present

## 2017-05-10 DIAGNOSIS — Z794 Long term (current) use of insulin: Secondary | ICD-10-CM | POA: Diagnosis not present

## 2017-05-10 DIAGNOSIS — D649 Anemia, unspecified: Secondary | ICD-10-CM | POA: Diagnosis not present

## 2017-05-14 DIAGNOSIS — R531 Weakness: Secondary | ICD-10-CM | POA: Diagnosis not present

## 2017-05-29 DIAGNOSIS — R531 Weakness: Secondary | ICD-10-CM | POA: Diagnosis not present

## 2017-06-06 DIAGNOSIS — I509 Heart failure, unspecified: Secondary | ICD-10-CM | POA: Diagnosis not present

## 2017-06-13 DIAGNOSIS — E1151 Type 2 diabetes mellitus with diabetic peripheral angiopathy without gangrene: Secondary | ICD-10-CM | POA: Diagnosis not present

## 2017-06-13 DIAGNOSIS — I509 Heart failure, unspecified: Secondary | ICD-10-CM | POA: Diagnosis not present

## 2017-06-13 DIAGNOSIS — F0391 Unspecified dementia with behavioral disturbance: Secondary | ICD-10-CM | POA: Diagnosis not present

## 2017-06-13 DIAGNOSIS — I130 Hypertensive heart and chronic kidney disease with heart failure and stage 1 through stage 4 chronic kidney disease, or unspecified chronic kidney disease: Secondary | ICD-10-CM | POA: Diagnosis not present

## 2017-06-13 DIAGNOSIS — N183 Chronic kidney disease, stage 3 (moderate): Secondary | ICD-10-CM | POA: Diagnosis not present

## 2017-06-13 DIAGNOSIS — M6281 Muscle weakness (generalized): Secondary | ICD-10-CM | POA: Diagnosis not present

## 2017-06-14 DIAGNOSIS — R197 Diarrhea, unspecified: Secondary | ICD-10-CM | POA: Diagnosis not present

## 2017-06-14 DIAGNOSIS — D649 Anemia, unspecified: Secondary | ICD-10-CM | POA: Diagnosis not present

## 2017-06-14 DIAGNOSIS — I130 Hypertensive heart and chronic kidney disease with heart failure and stage 1 through stage 4 chronic kidney disease, or unspecified chronic kidney disease: Secondary | ICD-10-CM | POA: Diagnosis not present

## 2017-06-14 DIAGNOSIS — R5383 Other fatigue: Secondary | ICD-10-CM | POA: Diagnosis not present

## 2017-06-14 DIAGNOSIS — E1151 Type 2 diabetes mellitus with diabetic peripheral angiopathy without gangrene: Secondary | ICD-10-CM | POA: Diagnosis not present

## 2017-06-14 DIAGNOSIS — I4891 Unspecified atrial fibrillation: Secondary | ICD-10-CM | POA: Diagnosis not present

## 2017-06-14 DIAGNOSIS — D640 Hereditary sideroblastic anemia: Secondary | ICD-10-CM | POA: Diagnosis not present

## 2017-06-14 DIAGNOSIS — I509 Heart failure, unspecified: Secondary | ICD-10-CM | POA: Diagnosis not present

## 2017-06-14 DIAGNOSIS — R748 Abnormal levels of other serum enzymes: Secondary | ICD-10-CM | POA: Diagnosis not present

## 2017-06-14 DIAGNOSIS — M6281 Muscle weakness (generalized): Secondary | ICD-10-CM | POA: Diagnosis not present

## 2017-06-14 DIAGNOSIS — E119 Type 2 diabetes mellitus without complications: Secondary | ICD-10-CM | POA: Diagnosis not present

## 2017-06-14 DIAGNOSIS — Z79899 Other long term (current) drug therapy: Secondary | ICD-10-CM | POA: Diagnosis not present

## 2017-06-14 DIAGNOSIS — N183 Chronic kidney disease, stage 3 (moderate): Secondary | ICD-10-CM | POA: Diagnosis not present

## 2017-06-14 DIAGNOSIS — F0391 Unspecified dementia with behavioral disturbance: Secondary | ICD-10-CM | POA: Diagnosis not present

## 2017-06-18 DIAGNOSIS — F0391 Unspecified dementia with behavioral disturbance: Secondary | ICD-10-CM | POA: Diagnosis not present

## 2017-06-18 DIAGNOSIS — I130 Hypertensive heart and chronic kidney disease with heart failure and stage 1 through stage 4 chronic kidney disease, or unspecified chronic kidney disease: Secondary | ICD-10-CM | POA: Diagnosis not present

## 2017-06-18 DIAGNOSIS — I509 Heart failure, unspecified: Secondary | ICD-10-CM | POA: Diagnosis not present

## 2017-06-18 DIAGNOSIS — N183 Chronic kidney disease, stage 3 (moderate): Secondary | ICD-10-CM | POA: Diagnosis not present

## 2017-06-18 DIAGNOSIS — M6281 Muscle weakness (generalized): Secondary | ICD-10-CM | POA: Diagnosis not present

## 2017-06-18 DIAGNOSIS — E1151 Type 2 diabetes mellitus with diabetic peripheral angiopathy without gangrene: Secondary | ICD-10-CM | POA: Diagnosis not present

## 2017-06-19 DIAGNOSIS — R531 Weakness: Secondary | ICD-10-CM | POA: Diagnosis not present

## 2017-06-20 DIAGNOSIS — N183 Chronic kidney disease, stage 3 (moderate): Secondary | ICD-10-CM | POA: Diagnosis not present

## 2017-06-20 DIAGNOSIS — I130 Hypertensive heart and chronic kidney disease with heart failure and stage 1 through stage 4 chronic kidney disease, or unspecified chronic kidney disease: Secondary | ICD-10-CM | POA: Diagnosis not present

## 2017-06-20 DIAGNOSIS — F0391 Unspecified dementia with behavioral disturbance: Secondary | ICD-10-CM | POA: Diagnosis not present

## 2017-06-20 DIAGNOSIS — M6281 Muscle weakness (generalized): Secondary | ICD-10-CM | POA: Diagnosis not present

## 2017-06-20 DIAGNOSIS — E1151 Type 2 diabetes mellitus with diabetic peripheral angiopathy without gangrene: Secondary | ICD-10-CM | POA: Diagnosis not present

## 2017-06-20 DIAGNOSIS — I509 Heart failure, unspecified: Secondary | ICD-10-CM | POA: Diagnosis not present

## 2017-06-25 DIAGNOSIS — I130 Hypertensive heart and chronic kidney disease with heart failure and stage 1 through stage 4 chronic kidney disease, or unspecified chronic kidney disease: Secondary | ICD-10-CM | POA: Diagnosis not present

## 2017-06-25 DIAGNOSIS — N183 Chronic kidney disease, stage 3 (moderate): Secondary | ICD-10-CM | POA: Diagnosis not present

## 2017-06-25 DIAGNOSIS — I509 Heart failure, unspecified: Secondary | ICD-10-CM | POA: Diagnosis not present

## 2017-06-25 DIAGNOSIS — F0391 Unspecified dementia with behavioral disturbance: Secondary | ICD-10-CM | POA: Diagnosis not present

## 2017-06-25 DIAGNOSIS — E1151 Type 2 diabetes mellitus with diabetic peripheral angiopathy without gangrene: Secondary | ICD-10-CM | POA: Diagnosis not present

## 2017-06-25 DIAGNOSIS — M6281 Muscle weakness (generalized): Secondary | ICD-10-CM | POA: Diagnosis not present

## 2017-06-27 DIAGNOSIS — M6281 Muscle weakness (generalized): Secondary | ICD-10-CM | POA: Diagnosis not present

## 2017-06-27 DIAGNOSIS — E1151 Type 2 diabetes mellitus with diabetic peripheral angiopathy without gangrene: Secondary | ICD-10-CM | POA: Diagnosis not present

## 2017-06-27 DIAGNOSIS — I509 Heart failure, unspecified: Secondary | ICD-10-CM | POA: Diagnosis not present

## 2017-06-27 DIAGNOSIS — N183 Chronic kidney disease, stage 3 (moderate): Secondary | ICD-10-CM | POA: Diagnosis not present

## 2017-06-27 DIAGNOSIS — I130 Hypertensive heart and chronic kidney disease with heart failure and stage 1 through stage 4 chronic kidney disease, or unspecified chronic kidney disease: Secondary | ICD-10-CM | POA: Diagnosis not present

## 2017-06-27 DIAGNOSIS — F0391 Unspecified dementia with behavioral disturbance: Secondary | ICD-10-CM | POA: Diagnosis not present

## 2017-06-28 DIAGNOSIS — N183 Chronic kidney disease, stage 3 (moderate): Secondary | ICD-10-CM | POA: Diagnosis not present

## 2017-06-28 DIAGNOSIS — I130 Hypertensive heart and chronic kidney disease with heart failure and stage 1 through stage 4 chronic kidney disease, or unspecified chronic kidney disease: Secondary | ICD-10-CM | POA: Diagnosis not present

## 2017-06-28 DIAGNOSIS — E1151 Type 2 diabetes mellitus with diabetic peripheral angiopathy without gangrene: Secondary | ICD-10-CM | POA: Diagnosis not present

## 2017-06-28 DIAGNOSIS — I509 Heart failure, unspecified: Secondary | ICD-10-CM | POA: Diagnosis not present

## 2017-06-28 DIAGNOSIS — M6281 Muscle weakness (generalized): Secondary | ICD-10-CM | POA: Diagnosis not present

## 2017-06-28 DIAGNOSIS — F0391 Unspecified dementia with behavioral disturbance: Secondary | ICD-10-CM | POA: Diagnosis not present

## 2017-07-02 DIAGNOSIS — N183 Chronic kidney disease, stage 3 (moderate): Secondary | ICD-10-CM | POA: Diagnosis not present

## 2017-07-02 DIAGNOSIS — I509 Heart failure, unspecified: Secondary | ICD-10-CM | POA: Diagnosis not present

## 2017-07-02 DIAGNOSIS — I130 Hypertensive heart and chronic kidney disease with heart failure and stage 1 through stage 4 chronic kidney disease, or unspecified chronic kidney disease: Secondary | ICD-10-CM | POA: Diagnosis not present

## 2017-07-02 DIAGNOSIS — E1151 Type 2 diabetes mellitus with diabetic peripheral angiopathy without gangrene: Secondary | ICD-10-CM | POA: Diagnosis not present

## 2017-07-02 DIAGNOSIS — M6281 Muscle weakness (generalized): Secondary | ICD-10-CM | POA: Diagnosis not present

## 2017-07-02 DIAGNOSIS — F0391 Unspecified dementia with behavioral disturbance: Secondary | ICD-10-CM | POA: Diagnosis not present

## 2017-07-03 DIAGNOSIS — E1151 Type 2 diabetes mellitus with diabetic peripheral angiopathy without gangrene: Secondary | ICD-10-CM | POA: Diagnosis not present

## 2017-07-03 DIAGNOSIS — M6281 Muscle weakness (generalized): Secondary | ICD-10-CM | POA: Diagnosis not present

## 2017-07-03 DIAGNOSIS — N183 Chronic kidney disease, stage 3 (moderate): Secondary | ICD-10-CM | POA: Diagnosis not present

## 2017-07-03 DIAGNOSIS — I509 Heart failure, unspecified: Secondary | ICD-10-CM | POA: Diagnosis not present

## 2017-07-03 DIAGNOSIS — I130 Hypertensive heart and chronic kidney disease with heart failure and stage 1 through stage 4 chronic kidney disease, or unspecified chronic kidney disease: Secondary | ICD-10-CM | POA: Diagnosis not present

## 2017-07-03 DIAGNOSIS — F0391 Unspecified dementia with behavioral disturbance: Secondary | ICD-10-CM | POA: Diagnosis not present

## 2017-07-09 DIAGNOSIS — M6281 Muscle weakness (generalized): Secondary | ICD-10-CM | POA: Diagnosis not present

## 2017-07-09 DIAGNOSIS — E1151 Type 2 diabetes mellitus with diabetic peripheral angiopathy without gangrene: Secondary | ICD-10-CM | POA: Diagnosis not present

## 2017-07-09 DIAGNOSIS — I130 Hypertensive heart and chronic kidney disease with heart failure and stage 1 through stage 4 chronic kidney disease, or unspecified chronic kidney disease: Secondary | ICD-10-CM | POA: Diagnosis not present

## 2017-07-09 DIAGNOSIS — I509 Heart failure, unspecified: Secondary | ICD-10-CM | POA: Diagnosis not present

## 2017-07-09 DIAGNOSIS — F0391 Unspecified dementia with behavioral disturbance: Secondary | ICD-10-CM | POA: Diagnosis not present

## 2017-07-09 DIAGNOSIS — N183 Chronic kidney disease, stage 3 (moderate): Secondary | ICD-10-CM | POA: Diagnosis not present

## 2017-07-12 DIAGNOSIS — F0391 Unspecified dementia with behavioral disturbance: Secondary | ICD-10-CM | POA: Diagnosis not present

## 2017-07-12 DIAGNOSIS — E1151 Type 2 diabetes mellitus with diabetic peripheral angiopathy without gangrene: Secondary | ICD-10-CM | POA: Diagnosis not present

## 2017-07-12 DIAGNOSIS — M6281 Muscle weakness (generalized): Secondary | ICD-10-CM | POA: Diagnosis not present

## 2017-07-12 DIAGNOSIS — N183 Chronic kidney disease, stage 3 (moderate): Secondary | ICD-10-CM | POA: Diagnosis not present

## 2017-07-12 DIAGNOSIS — I130 Hypertensive heart and chronic kidney disease with heart failure and stage 1 through stage 4 chronic kidney disease, or unspecified chronic kidney disease: Secondary | ICD-10-CM | POA: Diagnosis not present

## 2017-07-12 DIAGNOSIS — I509 Heart failure, unspecified: Secondary | ICD-10-CM | POA: Diagnosis not present

## 2017-07-17 DIAGNOSIS — I130 Hypertensive heart and chronic kidney disease with heart failure and stage 1 through stage 4 chronic kidney disease, or unspecified chronic kidney disease: Secondary | ICD-10-CM | POA: Diagnosis not present

## 2017-07-17 DIAGNOSIS — N183 Chronic kidney disease, stage 3 (moderate): Secondary | ICD-10-CM | POA: Diagnosis not present

## 2017-07-17 DIAGNOSIS — F0391 Unspecified dementia with behavioral disturbance: Secondary | ICD-10-CM | POA: Diagnosis not present

## 2017-07-17 DIAGNOSIS — I509 Heart failure, unspecified: Secondary | ICD-10-CM | POA: Diagnosis not present

## 2017-07-17 DIAGNOSIS — E1151 Type 2 diabetes mellitus with diabetic peripheral angiopathy without gangrene: Secondary | ICD-10-CM | POA: Diagnosis not present

## 2017-07-17 DIAGNOSIS — M6281 Muscle weakness (generalized): Secondary | ICD-10-CM | POA: Diagnosis not present

## 2017-07-20 DIAGNOSIS — M6281 Muscle weakness (generalized): Secondary | ICD-10-CM | POA: Diagnosis not present

## 2017-07-20 DIAGNOSIS — F0391 Unspecified dementia with behavioral disturbance: Secondary | ICD-10-CM | POA: Diagnosis not present

## 2017-07-20 DIAGNOSIS — I509 Heart failure, unspecified: Secondary | ICD-10-CM | POA: Diagnosis not present

## 2017-07-20 DIAGNOSIS — E1151 Type 2 diabetes mellitus with diabetic peripheral angiopathy without gangrene: Secondary | ICD-10-CM | POA: Diagnosis not present

## 2017-07-20 DIAGNOSIS — N183 Chronic kidney disease, stage 3 (moderate): Secondary | ICD-10-CM | POA: Diagnosis not present

## 2017-07-20 DIAGNOSIS — I130 Hypertensive heart and chronic kidney disease with heart failure and stage 1 through stage 4 chronic kidney disease, or unspecified chronic kidney disease: Secondary | ICD-10-CM | POA: Diagnosis not present

## 2017-07-24 DIAGNOSIS — F0391 Unspecified dementia with behavioral disturbance: Secondary | ICD-10-CM | POA: Diagnosis not present

## 2017-07-24 DIAGNOSIS — I509 Heart failure, unspecified: Secondary | ICD-10-CM | POA: Diagnosis not present

## 2017-07-24 DIAGNOSIS — I130 Hypertensive heart and chronic kidney disease with heart failure and stage 1 through stage 4 chronic kidney disease, or unspecified chronic kidney disease: Secondary | ICD-10-CM | POA: Diagnosis not present

## 2017-07-24 DIAGNOSIS — E1151 Type 2 diabetes mellitus with diabetic peripheral angiopathy without gangrene: Secondary | ICD-10-CM | POA: Diagnosis not present

## 2017-07-24 DIAGNOSIS — N183 Chronic kidney disease, stage 3 (moderate): Secondary | ICD-10-CM | POA: Diagnosis not present

## 2017-07-24 DIAGNOSIS — M6281 Muscle weakness (generalized): Secondary | ICD-10-CM | POA: Diagnosis not present

## 2017-07-26 DIAGNOSIS — I130 Hypertensive heart and chronic kidney disease with heart failure and stage 1 through stage 4 chronic kidney disease, or unspecified chronic kidney disease: Secondary | ICD-10-CM | POA: Diagnosis not present

## 2017-07-26 DIAGNOSIS — F0391 Unspecified dementia with behavioral disturbance: Secondary | ICD-10-CM | POA: Diagnosis not present

## 2017-07-26 DIAGNOSIS — E1151 Type 2 diabetes mellitus with diabetic peripheral angiopathy without gangrene: Secondary | ICD-10-CM | POA: Diagnosis not present

## 2017-07-26 DIAGNOSIS — N183 Chronic kidney disease, stage 3 (moderate): Secondary | ICD-10-CM | POA: Diagnosis not present

## 2017-07-26 DIAGNOSIS — I509 Heart failure, unspecified: Secondary | ICD-10-CM | POA: Diagnosis not present

## 2017-07-26 DIAGNOSIS — M6281 Muscle weakness (generalized): Secondary | ICD-10-CM | POA: Diagnosis not present

## 2018-01-12 DEATH — deceased

## 2018-02-11 DEATH — deceased
